# Patient Record
Sex: Male | Born: 1935
Health system: Southern US, Community
[De-identification: ages and names within clinical notes are randomized; demographics above are authoritative.]

## PROBLEM LIST (undated history)

## (undated) DIAGNOSIS — IMO0002 Reserved for concepts with insufficient information to code with codable children: Secondary | ICD-10-CM

## (undated) DIAGNOSIS — M170 Bilateral primary osteoarthritis of knee: Secondary | ICD-10-CM

## (undated) DIAGNOSIS — G20A1 Parkinson's disease without dyskinesia, without mention of fluctuations: Secondary | ICD-10-CM

## (undated) DIAGNOSIS — G20 Parkinson's disease: Secondary | ICD-10-CM

## (undated) DIAGNOSIS — N400 Enlarged prostate without lower urinary tract symptoms: Secondary | ICD-10-CM

## (undated) DIAGNOSIS — A0472 Enterocolitis due to Clostridium difficile, not specified as recurrent: Secondary | ICD-10-CM

## (undated) DIAGNOSIS — N179 Acute kidney failure, unspecified: Secondary | ICD-10-CM

## (undated) DIAGNOSIS — I10 Essential (primary) hypertension: Secondary | ICD-10-CM

## (undated) DIAGNOSIS — N200 Calculus of kidney: Secondary | ICD-10-CM

## (undated) DIAGNOSIS — I714 Abdominal aortic aneurysm, without rupture, unspecified: Secondary | ICD-10-CM

## (undated) DIAGNOSIS — Z9889 Other specified postprocedural states: Secondary | ICD-10-CM

## (undated) DIAGNOSIS — I2699 Other pulmonary embolism without acute cor pulmonale: Secondary | ICD-10-CM

## (undated) DIAGNOSIS — E039 Hypothyroidism, unspecified: Secondary | ICD-10-CM

## (undated) DIAGNOSIS — C069 Malignant neoplasm of mouth, unspecified: Secondary | ICD-10-CM

## (undated) DIAGNOSIS — M779 Enthesopathy, unspecified: Secondary | ICD-10-CM

## (undated) DIAGNOSIS — I82409 Acute embolism and thrombosis of unspecified deep veins of unspecified lower extremity: Secondary | ICD-10-CM

## (undated) HISTORY — PX: APPENDECTOMY: SHX54

## (undated) HISTORY — DX: Acute kidney failure, unspecified: N17.9

## (undated) HISTORY — DX: Enthesopathy, unspecified: M77.9

## (undated) HISTORY — PX: HERNIA REPAIR: SHX51

## (undated) HISTORY — DX: Essential (primary) hypertension: I10

## (undated) HISTORY — DX: Benign prostatic hyperplasia without lower urinary tract symptoms: N40.0

## (undated) HISTORY — DX: Hypothyroidism, unspecified: E03.9

## (undated) HISTORY — PX: CHOLECYSTECTOMY: SHX55

## (undated) HISTORY — DX: Reserved for concepts with insufficient information to code with codable children: IMO0002

## (undated) HISTORY — DX: Other specified postprocedural states: Z98.890

## (undated) HISTORY — DX: Bilateral primary osteoarthritis of knee: M17.0

## (undated) HISTORY — DX: Abdominal aortic aneurysm, without rupture: I71.4

## (undated) HISTORY — PX: TOTAL KNEE ARTHROPLASTY: SHX125

## (undated) HISTORY — DX: Parkinson's disease: G20

## (undated) HISTORY — PX: LITHOTRIPSY: SUR834

## (undated) HISTORY — DX: Abdominal aortic aneurysm, without rupture, unspecified: I71.40

## (undated) HISTORY — DX: Acute embolism and thrombosis of unspecified deep veins of unspecified lower extremity: I82.409

## (undated) HISTORY — DX: Malignant neoplasm of mouth, unspecified: C06.9

## (undated) HISTORY — DX: Calculus of kidney: N20.0

## (undated) HISTORY — DX: Parkinson's disease without dyskinesia, without mention of fluctuations: G20.A1

---

## 1996-02-29 DIAGNOSIS — I82409 Acute embolism and thrombosis of unspecified deep veins of unspecified lower extremity: Secondary | ICD-10-CM

## 1996-02-29 HISTORY — DX: Acute embolism and thrombosis of unspecified deep veins of unspecified lower extremity: I82.409

## 1998-02-28 DIAGNOSIS — Z9889 Other specified postprocedural states: Secondary | ICD-10-CM

## 1998-02-28 HISTORY — DX: Other specified postprocedural states: Z98.890

## 2002-02-28 DIAGNOSIS — C069 Malignant neoplasm of mouth, unspecified: Secondary | ICD-10-CM

## 2002-02-28 HISTORY — PX: OTHER SURGICAL HISTORY: SHX169

## 2002-02-28 HISTORY — DX: Malignant neoplasm of mouth, unspecified: C06.9

## 2005-02-28 DIAGNOSIS — E039 Hypothyroidism, unspecified: Secondary | ICD-10-CM

## 2005-02-28 HISTORY — PX: UMBILICAL HERNIA REPAIR: SHX196

## 2005-02-28 HISTORY — DX: Hypothyroidism, unspecified: E03.9

## 2005-02-28 HISTORY — PX: THYROIDECTOMY: SHX17

## 2008-02-29 DIAGNOSIS — IMO0002 Reserved for concepts with insufficient information to code with codable children: Secondary | ICD-10-CM

## 2008-02-29 HISTORY — PX: TOE AMPUTATION: SHX809

## 2008-02-29 HISTORY — DX: Reserved for concepts with insufficient information to code with codable children: IMO0002

## 2008-02-29 LAB — HM COLONOSCOPY: HM COLON: NORMAL

## 2009-02-28 DIAGNOSIS — N179 Acute kidney failure, unspecified: Secondary | ICD-10-CM

## 2009-02-28 HISTORY — DX: Acute kidney failure, unspecified: N17.9

## 2009-02-28 HISTORY — PX: TRANSURETHRAL RESECTION OF PROSTATE: SHX73

## 2010-02-28 DIAGNOSIS — M779 Enthesopathy, unspecified: Secondary | ICD-10-CM

## 2010-02-28 HISTORY — DX: Enthesopathy, unspecified: M77.9

## 2011-03-01 HISTORY — PX: REPAIR ZENKER'S DIVERTICULA: SUR1212

## 2013-07-31 ENCOUNTER — Ambulatory Visit (INDEPENDENT_AMBULATORY_CARE_PROVIDER_SITE_OTHER): Payer: Commercial Managed Care - HMO | Admitting: Internal Medicine

## 2013-07-31 ENCOUNTER — Encounter: Payer: Self-pay | Admitting: Internal Medicine

## 2013-07-31 VITALS — BP 138/80 | HR 64 | Temp 98.0°F | Ht 70.0 in | Wt 197.0 lb

## 2013-07-31 DIAGNOSIS — M779 Enthesopathy, unspecified: Secondary | ICD-10-CM

## 2013-07-31 DIAGNOSIS — M170 Bilateral primary osteoarthritis of knee: Secondary | ICD-10-CM

## 2013-07-31 DIAGNOSIS — L570 Actinic keratosis: Secondary | ICD-10-CM

## 2013-07-31 DIAGNOSIS — N400 Enlarged prostate without lower urinary tract symptoms: Secondary | ICD-10-CM | POA: Insufficient documentation

## 2013-07-31 DIAGNOSIS — M1710 Unilateral primary osteoarthritis, unspecified knee: Secondary | ICD-10-CM

## 2013-07-31 DIAGNOSIS — Z23 Encounter for immunization: Secondary | ICD-10-CM

## 2013-07-31 DIAGNOSIS — N200 Calculus of kidney: Secondary | ICD-10-CM | POA: Insufficient documentation

## 2013-07-31 DIAGNOSIS — S98139A Complete traumatic amputation of one unspecified lesser toe, initial encounter: Secondary | ICD-10-CM

## 2013-07-31 DIAGNOSIS — IMO0002 Reserved for concepts with insufficient information to code with codable children: Secondary | ICD-10-CM

## 2013-07-31 DIAGNOSIS — S98131A Complete traumatic amputation of one right lesser toe, initial encounter: Secondary | ICD-10-CM | POA: Insufficient documentation

## 2013-07-31 DIAGNOSIS — I10 Essential (primary) hypertension: Secondary | ICD-10-CM

## 2013-07-31 DIAGNOSIS — E039 Hypothyroidism, unspecified: Secondary | ICD-10-CM | POA: Insufficient documentation

## 2013-07-31 HISTORY — DX: Actinic keratosis: L57.0

## 2013-07-31 LAB — CBC WITH DIFFERENTIAL/PLATELET
Basophils Absolute: 0 10*3/uL (ref 0.0–0.1)
Basophils Relative: 0 % (ref 0.0–3.0)
EOS PCT: 1.3 % (ref 0.0–5.0)
Eosinophils Absolute: 0.1 10*3/uL (ref 0.0–0.7)
HCT: 46 % (ref 39.0–52.0)
Hemoglobin: 15.2 g/dL (ref 13.0–17.0)
LYMPHS PCT: 14.7 % (ref 12.0–46.0)
Lymphs Abs: 1.2 10*3/uL (ref 0.7–4.0)
MCHC: 33.1 g/dL (ref 30.0–36.0)
MCV: 90.9 fl (ref 78.0–100.0)
Monocytes Absolute: 0.6 10*3/uL (ref 0.1–1.0)
Monocytes Relative: 7.6 % (ref 3.0–12.0)
NEUTROS ABS: 6.4 10*3/uL (ref 1.4–7.7)
Neutrophils Relative %: 76.4 % (ref 43.0–77.0)
Platelets: 242 10*3/uL (ref 150.0–400.0)
RBC: 5.06 Mil/uL (ref 4.22–5.81)
RDW: 13.8 % (ref 11.5–15.5)
WBC: 8.3 10*3/uL (ref 4.0–10.5)

## 2013-07-31 LAB — COMPREHENSIVE METABOLIC PANEL
ALT: 9 U/L (ref 0–53)
AST: 15 U/L (ref 0–37)
Albumin: 4.2 g/dL (ref 3.5–5.2)
Alkaline Phosphatase: 78 U/L (ref 39–117)
BUN: 17 mg/dL (ref 6–23)
CALCIUM: 9.3 mg/dL (ref 8.4–10.5)
CHLORIDE: 103 meq/L (ref 96–112)
CO2: 30 mEq/L (ref 19–32)
CREATININE: 1.5 mg/dL (ref 0.4–1.5)
GFR: 46.38 mL/min — ABNORMAL LOW (ref >60.00)
Glucose, Bld: 89 mg/dL (ref 70–99)
Potassium: 4.1 mEq/L (ref 3.5–5.1)
Sodium: 140 mEq/L (ref 135–145)
Total Bilirubin: 1.2 mg/dL (ref 0.2–1.2)
Total Protein: 7.1 g/dL (ref 6.0–8.3)

## 2013-07-31 LAB — LIPID PANEL
CHOL/HDL RATIO: 4
Cholesterol: 155 mg/dL (ref 0–200)
HDL: 34.6 mg/dL — ABNORMAL LOW (ref >39.00)
LDL CALC: 96 mg/dL (ref 0–99)
NonHDL: 120.4
Triglycerides: 123 mg/dL (ref 0.0–149.0)
VLDL: 24.6 mg/dL (ref 0.0–40.0)

## 2013-07-31 LAB — TSH: TSH: 0.68 u[IU]/mL (ref 0.35–4.50)

## 2013-07-31 LAB — T4, FREE: Free T4: 1.15 ng/dL (ref 0.60–1.60)

## 2013-07-31 NOTE — Assessment & Plan Note (Signed)
Still with symptoms despite TURP No apparent obstruction

## 2013-07-31 NOTE — Assessment & Plan Note (Signed)
BP Readings from Last 3 Encounters:  07/31/13 138/80   Good control

## 2013-07-31 NOTE — Assessment & Plan Note (Signed)
Due to overlying toe No problems with this

## 2013-07-31 NOTE — Assessment & Plan Note (Signed)
Seems euthyroid ?Will check labs ?

## 2013-07-31 NOTE — Assessment & Plan Note (Signed)
Significant disability  Plans more therapy Handicapped form done

## 2013-07-31 NOTE — Progress Notes (Signed)
Pre visit review using our clinic review tool, if applicable. No additional management support is needed unless otherwise documented below in the visit note. 

## 2013-07-31 NOTE — Addendum Note (Signed)
Addended by: Despina Hidden on: 07/31/2013 01:09 PM   Modules accepted: Orders

## 2013-07-31 NOTE — Progress Notes (Signed)
Subjective:    Patient ID: Walter [name], male    DOB: [birth year], 80 y.o.   MRN: [id number]  HPI Here to establish Recently moved to [facility] from [place]  Has been followed for high blood pressure No problems with medication  Had complicated time with knee surgery DVT once Then prolonged problems after TKR  Had statin induced tendonitis Some trouble with strength Needs therapy Has limited his activities  Hypothyroid since thyroidectomy Had cancer scare but pathology negative Continues to be on med  Arthritis mostly in knees Had left TKR Pain is better No meds now  Multiple kidney stones 2 lithotripsy procedures-- and another one removed cystoscopically No meds for secondary prevention  No current outpatient prescriptions on file prior to visit.   No current facility-administered medications on file prior to visit.    Allergies  Allergen Reactions  . Quinolones     tendonitis    Past Medical History  Diagnosis Date  . Hypertension   . Oral cancer 2004    graft from left thigh  . Osteoarthritis of both knees   . DVT (deep venous thrombosis) 1998    after left knee arthroscopy  . ARF (acute renal failure) 2011    after TKR  . Toe amputation status 2010  . Tendonitis 2012    from quinolone  . BPH (benign prostatic hypertrophy)   . Kidney stones     recurrent  . Hypothyroid 2007    postop from thyroidectomy (cancer scare)    Past Surgical History  Procedure Laterality Date  . Cholecystectomy    . Appendectomy    . Oral cancer removed Right 2004  . Thyroidectomy  2007  . Umbilical hernia repair  2007  . Lithotripsy  2009/2010    then stent and cystoscopic removal 2014  . Total knee arthroplasty Left   . Toe amputation Left 2010    badly overlapping other toe  . Transurethral resection of prostate  2011  . Repair zenker's diverticula  2013    Family History  Problem Relation Age of Onset  . Stroke Father   . Heart disease Neg Hx    . Diabetes Neg Hx     History   Social History  . Marital Status: Married    Spouse Name: N/A    Number of Children: 3  . Years of Education: N/A   Occupational History  . 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 Smoking status: Former Smoker    Types: Cigarettes    Quit date: 04/27/2002  . Smokeless tobacco: Never Used  . Alcohol Use: Yes  . Drug Use: No  . Sexual Activity: Not on file   Other Topics Concern  . Not on file   Social History Narrative   Son works for [employer]   1 daughter in [place], other in [place] living will   Wife, then son, would be health care POA   He isn't sure about DNR   No tube feeds if cognitively unaware   Review of Systems  Constitutional: Negative for fatigue and unexpected weight change.  HENT: Negative for dental problem, hearing loss and tinnitus.   Eyes: Negative for visual disturbance.       No diplopia or unilateral vision loss  Respiratory: Negative for cough, chest tightness and shortness of breath.   Cardiovascular: Negative for chest pain, palpitations and leg swelling.  Gastrointestinal: Negative for nausea, vomiting, abdominal pain, constipation and blood in stool.       No heartburn  Genitourinary: Positive for frequency and difficulty urinating.       Mild slow stream No dribbling usually Ongoing nocturia-- x3 usually  Musculoskeletal: Positive for arthralgias. Negative for back pain and joint swelling.       Mostly just knee problems AM stiffness--tylenol helps  Skin: Negative for rash.       Some dry spots on nose  Allergic/Immunologic: Positive for environmental allergies. Negative for immunocompromised state.       Mild pollen issues-- rare OTC med  Neurological: Positive for weakness. Negative for dizziness, syncope, light-headedness and headaches.  Psychiatric/Behavioral: Negative for sleep disturbance and dysphoric mood. The  patient is not nervous/anxious.        Objective:   Physical Exam  Constitutional: He appears well-developed and well-nourished. No distress.  HENT:  Mouth/Throat: Oropharynx is clear and moist. No oropharyngeal exudate.  Neck: Normal range of motion. Neck supple. No thyromegaly present.  Cardiovascular: Normal rate, regular rhythm, normal heart sounds and intact distal pulses.  Exam reveals no gallop.   No murmur heard. Pulmonary/Chest: Effort normal and breath sounds normal. No respiratory distress. He has no wheezes. He has no rales.  Abdominal: Soft. There is no tenderness.  Musculoskeletal:  1+ non pitting edema  Lymphadenopathy:    He has no cervical adenopathy.  Neurological:  Gait is stiff and slow Mild balance impairment as well  Skin:  2 actinics on nose  Psychiatric: He has a normal mood and affect. His behavior is normal.          Assessment & Plan:

## 2013-07-31 NOTE — Assessment & Plan Note (Signed)
Just uses tylenol

## 2013-07-31 NOTE — Assessment & Plan Note (Signed)
Liquid nitrogen 30 seconds x 2 Discussed home care Will probably establish with derm

## 2013-08-01 ENCOUNTER — Telehealth: Payer: Self-pay | Admitting: Internal Medicine

## 2013-08-01 NOTE — Telephone Encounter (Signed)
Relevant patient education assigned to patient using Emmi. ° °

## 2013-08-06 ENCOUNTER — Encounter: Payer: Self-pay | Admitting: *Deleted

## 2013-09-06 ENCOUNTER — Encounter: Payer: Self-pay | Admitting: Internal Medicine

## 2013-09-06 ENCOUNTER — Ambulatory Visit (INDEPENDENT_AMBULATORY_CARE_PROVIDER_SITE_OTHER): Payer: Commercial Managed Care - HMO | Admitting: Internal Medicine

## 2013-09-06 VITALS — BP 140/70 | HR 68 | Temp 97.7°F | Wt 196.0 lb

## 2013-09-06 DIAGNOSIS — I10 Essential (primary) hypertension: Secondary | ICD-10-CM

## 2013-09-06 NOTE — Progress Notes (Signed)
Pre visit review using our clinic review tool, if applicable. No additional management support is needed unless otherwise documented below in the visit note. 

## 2013-09-06 NOTE — Progress Notes (Signed)
Subjective:    Patient ID: Walter [name], male    DOB: 11-Jun-1931, 80 y.o.   MRN: [id number]  HPI Here due to being denied on annuity due to failing memory screen on phone He doesn't notice any issues He handles all the finances No problems in car--doesn't get lost or have trouble getting around. More cautious  Current Outpatient Prescriptions on File Prior to Visit  Medication Sig Dispense Refill  . acetaminophen (TYLENOL) 500 MG tablet Take 1,000 mg by mouth daily as needed.      Marland Kitchen amLODipine (NORVASC) 5 MG tablet Take 5 mg by mouth daily.      . Cholecalciferol (VITAMIN D) 2000 UNITS CAPS Take by mouth daily.      Marland Kitchen levothyroxine (SYNTHROID, LEVOTHROID) 112 MCG tablet Take 112 mcg by mouth daily before breakfast.       No current facility-administered medications on file prior to visit.    Allergies  Allergen Reactions  . Quinolones     tendonitis    Past Medical History  Diagnosis Date  . Hypertension   . Oral cancer 2004    graft from left thigh  . Osteoarthritis of both knees   . DVT (deep venous thrombosis) 1998    after left knee arthroscopy  . ARF (acute renal failure) 2011    after TKR  . Toe amputation status 2010  . Tendonitis 2012    from quinolone  . BPH (benign prostatic hypertrophy)   . Kidney stones     recurrent  . Hypothyroid 2007    postop from thyroidectomy (cancer scare)    Past Surgical History  Procedure Laterality Date  . Cholecystectomy    . Appendectomy    . Oral cancer removed Right 2004  . Thyroidectomy  2007  . Umbilical hernia repair  2007  . Lithotripsy  2009/2010    then stent and cystoscopic removal 2014  . Total knee arthroplasty Left   . Toe amputation Right 2010    badly overlapping other toe  . Transurethral resection of prostate  2011  . Repair zenker's diverticula  2013    Family History  Problem Relation Age of Onset  . Stroke Father   . Heart disease Neg Hx   . Diabetes Neg Hx     History   Social History    . Marital Status: Married    Spouse Name: N/A    Number of Children: 3  . Years of Education: N/A   Occupational History  . 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 Smoking status: Former Smoker    Types: Cigarettes    Quit date: 04/27/2002  . Smokeless tobacco: Never Used  . Alcohol Use: Yes  . Drug Use: No  . Sexual Activity: Not on file   Other Topics Concern  . Not on file   Social History Narrative   Son works for [employer]   1 daughter in [place], other in [place]      Has living will   Wife, then son, would be health care POA   He isn't sure about DNR   No tube feeds if cognitively unaware   Review of Systems Sleeps fairly well Nocturia x 3 generally--had improved only briefly after TURP Appetite is fine Weight is stable    Objective:   Physical Exam  Constitutional: He is oriented to person, place, and time. He appears well-developed and well-nourished. No distress.  Neurological: He is alert and oriented to person, place, and time.  President-- "Obama, Tawni Pummel, Clinton" [phone] [id number] Recall 3/3          Assessment & Plan:

## 2013-09-06 NOTE — Assessment & Plan Note (Signed)
BP Readings from Last 3 Encounters:  09/06/13 140/70  07/31/13 138/80   Still good control No concern about cognitive issues---letter written

## 2013-12-04 ENCOUNTER — Ambulatory Visit (INDEPENDENT_AMBULATORY_CARE_PROVIDER_SITE_OTHER): Payer: Commercial Managed Care - HMO

## 2013-12-04 DIAGNOSIS — Z23 Encounter for immunization: Secondary | ICD-10-CM

## 2013-12-26 ENCOUNTER — Other Ambulatory Visit: Payer: Self-pay

## 2013-12-26 MED ORDER — LEVOTHYROXINE SODIUM 112 MCG PO TABS: 112.0000 ug | ORAL_TABLET | Freq: Every day | ORAL | Status: DC

## 2013-12-26 MED ORDER — AMLODIPINE BESYLATE 5 MG PO TABS: 5.0000 mg | ORAL_TABLET | Freq: Every day | ORAL | Status: DC

## 2013-12-26 NOTE — Telephone Encounter (Signed)
Note left requesting refill amlodipine and levothyroxine to [facility]; advised pt done.

## 2014-01-01 ENCOUNTER — Encounter: Payer: Self-pay | Admitting: Internal Medicine

## 2014-01-01 ENCOUNTER — Ambulatory Visit (INDEPENDENT_AMBULATORY_CARE_PROVIDER_SITE_OTHER): Payer: Commercial Managed Care - HMO | Admitting: Internal Medicine

## 2014-01-01 VITALS — BP 138/84 | HR 70 | Temp 97.8°F | Resp 14 | Wt 191.2 lb

## 2014-01-01 DIAGNOSIS — Q396 Congenital diverticulum of esophagus: Secondary | ICD-10-CM | POA: Insufficient documentation

## 2014-01-01 NOTE — Assessment & Plan Note (Signed)
Reviewed his diet Likes oatmeal for breakfast Trouble at lunch--- discussed tuna and chicken salad(chopped) Troubles with fruits and vegetables---especially with rind No GERD so will avoid acid reducing therapy  Will refer for medical nutrition therapy to be sure he is getting a proper balanced diet

## 2014-01-01 NOTE — Progress Notes (Signed)
Pre visit review using our clinic review tool, if applicable. No additional management support is needed unless otherwise documented below in the visit note. 

## 2014-01-01 NOTE — Progress Notes (Signed)
Subjective:    Patient ID: Walter [name], male    DOB: [birth year], 80 y.o.   MRN: [id number]  HPI Here with wife  He has been having teeth removed--- 6 pulled on top and now has partial Trouble chewing with bad feet Has Zenker's diverticulum operated on in esophagus--- incomplete removal (or closing off-- it was "stapled") Has had intermittent choking issues in past if not careful with eating  Now especially having trouble with meat with new partial denture Wife trying to cook soft food for him Saw another specialist in [place]--didn't recommend resection of the diverticulum  Current Outpatient Prescriptions on File Prior to Visit  Medication Sig Dispense Refill  . acetaminophen (TYLENOL) 500 MG tablet Take 1,000 mg by mouth daily as needed.    Marland Kitchen amLODipine (NORVASC) 5 MG tablet Take 1 tablet (5 mg total) by mouth daily. 90 tablet 1  . Cholecalciferol (VITAMIN D) 2000 UNITS CAPS Take by mouth daily.    Marland Kitchen levothyroxine (SYNTHROID, LEVOTHROID) 112 MCG tablet Take 1 tablet (112 mcg total) by mouth daily before breakfast. 90 tablet 2   No current facility-administered medications on file prior to visit.    Allergies  Allergen Reactions  . Quinolones     tendonitis    Past Medical History  Diagnosis Date  . Hypertension   . Oral cancer 2004    graft from left thigh  . Osteoarthritis of both knees   . DVT (deep venous thrombosis) 1998    after left knee arthroscopy  . ARF (acute renal failure) 2011    after TKR  . Toe amputation status 2010  . Tendonitis 2012    from quinolone  . BPH (benign prostatic hypertrophy)   . Kidney stones     recurrent  . Hypothyroid 2007    postop from thyroidectomy (cancer scare)    Past Surgical History  Procedure Laterality Date  . Cholecystectomy    . Appendectomy    . Oral cancer removed Right 2004  . Thyroidectomy  2007  . Umbilical hernia repair  2007  . Lithotripsy  2009/2010    then stent and cystoscopic removal 2014    . Total knee arthroplasty Left   . Toe amputation Right 2010    badly overlapping other toe  . Transurethral resection of prostate  2011  . Repair zenker's diverticula  2013    Family History  Problem Relation Age of Onset  . Stroke Father   . Heart disease Neg Hx   . Diabetes Neg Hx     History   Social History  . Marital Status: Married    Spouse Name: N/A    Number of Children: 3  . Years of Education: N/A   Occupational History  . 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 Smoking status: Former Smoker    Types: Cigarettes    Quit date: 04/27/2002  . Smokeless tobacco: Never Used  . Alcohol Use: Yes  . Drug Use: No  . Sexual Activity: Not on file   Other Topics Concern  . Not on file   Social History Narrative   Son works for [employer]   1 daughter in [place], other in [place] living will   Wife, then son, would be health care POA   He isn't sure about DNR   No tube feeds if cognitively unaware   Review of Systems Has lost  5# Especially has trouble with bread    Objective:   Physical Exam  Psychiatric: He has a normal mood and affect. His behavior is normal.          Assessment & Plan:

## 2014-02-17 ENCOUNTER — Ambulatory Visit (INDEPENDENT_AMBULATORY_CARE_PROVIDER_SITE_OTHER): Payer: Commercial Managed Care - HMO | Admitting: Internal Medicine

## 2014-02-17 ENCOUNTER — Encounter: Payer: Self-pay | Admitting: Internal Medicine

## 2014-02-17 VITALS — BP 130/70 | HR 66 | Temp 97.5°F | Wt 190.0 lb

## 2014-02-17 DIAGNOSIS — S335XXA Sprain of ligaments of lumbar spine, initial encounter: Secondary | ICD-10-CM

## 2014-02-17 DIAGNOSIS — R300 Dysuria: Secondary | ICD-10-CM

## 2014-02-17 DIAGNOSIS — N200 Calculus of kidney: Secondary | ICD-10-CM

## 2014-02-17 LAB — POCT URINALYSIS DIPSTICK
Bilirubin, UA: NEGATIVE
Blood, UA: NEGATIVE
Glucose, UA: NEGATIVE
KETONES UA: NEGATIVE
LEUKOCYTES UA: NEGATIVE
NITRITE UA: NEGATIVE
PH UA: 6.5
Protein, UA: NEGATIVE
Spec Grav, UA: 1.02
Urobilinogen, UA: NEGATIVE

## 2014-02-17 NOTE — Progress Notes (Signed)
Pre visit review using our clinic review tool, if applicable. No additional management support is needed unless otherwise documented below in the visit note. 

## 2014-02-17 NOTE — Progress Notes (Signed)
Subjective:    Patient ID: Walter [name], male    DOB: 08/22/1931, 80 y.o.   MRN: [id number]  HPI Here for possible kidney stones Long history of this  Had procedure with stent about a year ago--before moving here Stone was removed through stent then  Lithotripsy x 2 in past  ~1 week ago---bent over Suddenly had "crack in back"  Now having pain in back and across his abdomen--points to bilateral lumbar paraspinals Hurts getting in and out of car Has to move his trunk slowly Wonders if stones moved Seems better now over the past few days  No dysuria No sig urgency No hematuria Nocturia x 3-4 is stable  No fever Attack now is not like his bad kidney stone attacks  Current Outpatient Prescriptions on File Prior to Visit  Medication Sig Dispense Refill  . acetaminophen (TYLENOL) 500 MG tablet Take 1,000 mg by mouth daily as needed.    Marland Kitchen amLODipine (NORVASC) 5 MG tablet Take 1 tablet (5 mg total) by mouth daily. 90 tablet 1  . Cholecalciferol (VITAMIN D) 2000 UNITS CAPS Take by mouth daily.    Marland Kitchen levothyroxine (SYNTHROID, LEVOTHROID) 112 MCG tablet Take 1 tablet (112 mcg total) by mouth daily before breakfast. 90 tablet 2   No current facility-administered medications on file prior to visit.    Allergies  Allergen Reactions  . Quinolones     tendonitis    Past Medical History  Diagnosis Date  . Hypertension   . Oral cancer 2004    graft from left thigh  . Osteoarthritis of both knees   . DVT (deep venous thrombosis) 1998    after left knee arthroscopy  . ARF (acute renal failure) 2011    after TKR  . Toe amputation status 2010  . Tendonitis 2012    from quinolone  . BPH (benign prostatic hypertrophy)   . Kidney stones     recurrent  . Hypothyroid 2007    postop from thyroidectomy (cancer scare)    Past Surgical History  Procedure Laterality Date  . Cholecystectomy    . Appendectomy    . Oral cancer removed Right 2004  . Thyroidectomy  2007  . Umbilical  hernia repair  2007  . Lithotripsy  2009/2010    then stent and cystoscopic removal 2014  . Total knee arthroplasty Left   . Toe amputation Right 2010    badly overlapping other toe  . Transurethral resection of prostate  2011  . Repair zenker's diverticula  2013    Family History  Problem Relation Age of Onset  . Stroke Father   . Heart disease Neg Hx   . Diabetes Neg Hx     History   Social History  . Marital Status: Married    Spouse Name: N/A    Number of Children: 3  . Years of Education: N/A   Occupational History  . 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 Smoking status: Former Smoker    Types: Cigarettes    Quit date: 04/27/2002  . Smokeless tobacco: Never Used  . Alcohol Use: Yes  . Drug Use: No  . Sexual Activity: Not on file   Other Topics Concern  . Not on file   Social History Narrative   Son works for [employer]   1 daughter in [place], other in [place] living will   Wife, then son,  would be health care POA   He isn't sure about DNR   No tube feeds if cognitively unaware   Review of Systems  No nausea or vomiting Appetite is fair     Objective:   Physical Exam  Constitutional: He appears well-developed and well-nourished. No distress.  Musculoskeletal:  Walking stiffly  Fair flexion (about 80 degrees) SLR negative No spine tenderness Sensitive in bilateral lumbar paraspinals No CVA tenderness  Neurological:  No focal weakness          Assessment & Plan:

## 2014-02-17 NOTE — Assessment & Plan Note (Signed)
Wants to establish with local urologist

## 2014-02-17 NOTE — Assessment & Plan Note (Signed)
Reassured him that this doesn't seem to be a kidney stone Just back sprain Discussed continuing heat and tylenol This gets better on its own

## 2014-04-03 DIAGNOSIS — Z87442 Personal history of urinary calculi: Secondary | ICD-10-CM | POA: Diagnosis not present

## 2014-04-03 DIAGNOSIS — R351 Nocturia: Secondary | ICD-10-CM | POA: Diagnosis not present

## 2014-04-03 DIAGNOSIS — N401 Enlarged prostate with lower urinary tract symptoms: Secondary | ICD-10-CM | POA: Diagnosis not present

## 2014-04-07 ENCOUNTER — Ambulatory Visit: Payer: Self-pay | Admitting: Urology

## 2014-04-07 DIAGNOSIS — N200 Calculus of kidney: Secondary | ICD-10-CM | POA: Diagnosis not present

## 2014-04-15 ENCOUNTER — Ambulatory Visit (INDEPENDENT_AMBULATORY_CARE_PROVIDER_SITE_OTHER): Payer: Commercial Managed Care - HMO | Admitting: Internal Medicine

## 2014-04-15 ENCOUNTER — Encounter: Payer: Self-pay | Admitting: Internal Medicine

## 2014-04-15 VITALS — BP 128/84 | HR 67 | Temp 98.0°F | Wt 187.0 lb

## 2014-04-15 DIAGNOSIS — H6123 Impacted cerumen, bilateral: Secondary | ICD-10-CM | POA: Diagnosis not present

## 2014-04-15 NOTE — Patient Instructions (Signed)
Otalgia  The most common reason for this in children is an infection of the middle ear. Pain from the middle ear is usually caused by a build-up of fluid and pressure behind the eardrum. Pain from an earache can be sharp, dull, or burning. The pain may be temporary or constant. The middle ear is connected to the nasal passages by a short narrow tube called the Eustachian tube. The Eustachian tube allows fluid to drain out of the middle ear, and helps keep the pressure in your ear equalized.  CAUSES   A cold or allergy can block the Eustachian tube with inflammation and the build-up of secretions. This is especially likely in small children, because their Eustachian tube is shorter and more horizontal. When the Eustachian tube closes, the normal flow of fluid from the middle ear is stopped. Fluid can accumulate and cause stuffiness, pain, hearing loss, and an ear infection if germs start growing in this area.  SYMPTOMS   The symptoms of an ear infection may include fever, ear pain, fussiness, increased crying, and irritability. Many children will have temporary and minor hearing loss during and right after an ear infection. Permanent hearing loss is rare, but the risk increases the more infections a child has. Other causes of ear pain include retained water in the outer ear canal from swimming and bathing.  Ear pain in adults is less likely to be from an ear infection. Ear pain may be referred from other locations. Referred pain may be from the joint between your jaw and the skull. It may also come from a tooth problem or problems in the neck. Other causes of ear pain include:   A foreign body in the ear.   Outer ear infection.   Sinus infections.   Impacted ear wax.   Ear injury.   Arthritis of the jaw or TMJ problems.   Middle ear infection.   Tooth infections.   Sore throat with pain to the ears.  DIAGNOSIS   Your caregiver can usually make the diagnosis by examining you. Sometimes other special studies,  including x-rays and lab work may be necessary.  TREATMENT    If antibiotics were prescribed, use them as directed and finish them even if you or your child's symptoms seem to be improved.   Sometimes PE tubes are needed in children. These are little plastic tubes which are put into the eardrum during a simple surgical procedure. They allow fluid to drain easier and allow the pressure in the middle ear to equalize. This helps relieve the ear pain caused by pressure changes.  HOME CARE INSTRUCTIONS    Only take over-the-counter or prescription medicines for pain, discomfort, or fever as directed by your caregiver. DO NOT GIVE CHILDREN ASPIRIN because of the association of Reye's Syndrome in children taking aspirin.   Use a cold pack applied to the outer ear for 15-20 minutes, 03-04 times per day or as needed may reduce pain. Do not apply ice directly to the skin. You may cause frost bite.   Over-the-counter ear drops used as directed may be effective. Your caregiver may sometimes prescribe ear drops.   Resting in an upright position may help reduce pressure in the middle ear and relieve pain.   Ear pain caused by rapidly descending from high altitudes can be relieved by swallowing or chewing gum. Allowing infants to suck on a bottle during airplane travel can help.   Do not smoke in the house or near children. If you are   unable to quit smoking, smoke outside.   Control allergies.  SEEK IMMEDIATE MEDICAL CARE IF:    You or your child are becoming sicker.   Pain or fever relief is not obtained with medicine.   You or your child's symptoms (pain, fever, or irritability) do not improve within 24 to 48 hours or as instructed.   Severe pain suddenly stops hurting. This may indicate a ruptured eardrum.   You or your children develop new problems such as severe headaches, stiff neck, difficulty swallowing, or swelling of the face or around the ear.  Document Released: 10/02/2003 Document Revised: 05/09/2011  Document Reviewed: 02/06/2008  [employer] Patient Information 2015 [employer]. This information is not intended to replace advice given to you by your health care provider. Make sure you discuss any questions you have with your health care provider.

## 2014-04-15 NOTE — Progress Notes (Signed)
Pre visit review using our clinic review tool, if applicable. No additional management support is needed unless otherwise documented below in the visit note. 

## 2014-04-15 NOTE — Progress Notes (Signed)
Subjective:    Patient ID: Walter [name], male    DOB: 01-Jun-1931, 81 y.o.   MRN: [id number]  HPI  Pt presents to the clinic today with c/o bilateral ear pain. He reports this started yesterday. He denies upper respiratory infection, fever, more hearing loss than normal or dizziness. He reports that he had the nurse look at it at [facility] and she advised him to come have it evaluated, thought he may have had pus in his ear.  Review of Systems      Past Medical History  Diagnosis Date  . Hypertension   . Oral cancer 2004    graft from left thigh  . Osteoarthritis of both knees   . DVT (deep venous thrombosis) 1998    after left knee arthroscopy  . ARF (acute renal failure) 2011    after TKR  . Toe amputation status 2010  . Tendonitis 2012    from quinolone  . BPH (benign prostatic hypertrophy)   . Kidney stones     recurrent  . Hypothyroid 2007    postop from thyroidectomy (cancer scare)    Current Outpatient Prescriptions  Medication Sig Dispense Refill  . acetaminophen (TYLENOL) 500 MG tablet Take 1,000 mg by mouth daily as needed.    Marland Kitchen amLODipine (NORVASC) 5 MG tablet Take 1 tablet (5 mg total) by mouth daily. 90 tablet 1  . Cholecalciferol (VITAMIN D) 2000 UNITS CAPS Take by mouth daily.    Marland Kitchen levothyroxine (SYNTHROID, LEVOTHROID) 112 MCG tablet Take 1 tablet (112 mcg total) by mouth daily before breakfast. 90 tablet 2   No current facility-administered medications for this visit.    Allergies  Allergen Reactions  . Quinolones     tendonitis    Family History  Problem Relation Age of Onset  . Stroke Father   . Heart disease Neg Hx   . Diabetes Neg Hx     History   Social History  . Marital Status: Married    Spouse Name: N/A  . Number of Children: 3  . Years of Education: N/A   Occupational History  . 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 Smoking status: Former Smoker   Types: Cigarettes    Quit date: 04/27/2002  . Smokeless tobacco: Never Used  . Alcohol Use: Yes  . Drug Use: No  . Sexual Activity: Not on file   Other Topics Concern  . Not on file   Social History Narrative   Son works for [employer]   1 daughter in [place], other in [place] living will   Wife, then son, would be health care POA   He isn't sure about DNR   No tube feeds if cognitively unaware     Constitutional: Denies fever, malaise, fatigue, headache or abrupt weight changes.  HEENT: Pt reports ear pain. Denies eye pain, eye redness, ringing in the ears, wax buildup, runny nose, nasal congestion, bloody nose, or sore throat. Respiratory: Denies difficulty breathing, shortness of breath, cough or sputum production.   Cardiovascular: Denies chest pain, chest tightness, palpitations or swelling in the hands or feet.  Neurological: Denies dizziness.   No other specific complaints in a complete review of systems (except as listed in HPI above).  Objective:   Physical Exam BP 128/84 mmHg  Pulse 67  Temp(Src) 98 F (36.7 C) (Oral)  Wt 187 lb (84.823 kg)  SpO2  97% Wt Readings from Last 3 Encounters:  04/15/14 187 lb (84.823 kg)  02/17/14 190 lb (86.183 kg)  01/01/14 191 lb 4 oz (86.75 kg)    General: Appears his stated age, well developed, well nourished in NAD. HEENT: Head: normal shape and size;  Ears: Tm's gray and intact, normal light reflex;, slight cerumen buildup. Cardiovascular: Normal rate and rhythm. S1,S2 noted.  No murmur, rubs or gallops noted.  Pulmonary/Chest: Normal effort and positive vesicular breath sounds. No respiratory distress. No wheezes, rales or ronchi noted.  Neurological: Alert and oriented.   BMET    Component Value Date/Time   NA 140 07/31/2013 1320   K 4.1 07/31/2013 1320   CL 103 07/31/2013 1320   CO2 30 07/31/2013 1320   GLUCOSE 89 07/31/2013 1320   BUN 17 07/31/2013 1320   CREATININE 1.5 07/31/2013 1320   CALCIUM 9.3  07/31/2013 1320    Lipid Panel     Component Value Date/Time   CHOL 155 07/31/2013 1320   TRIG 123.0 07/31/2013 1320   HDL 34.60* 07/31/2013 1320   CHOLHDL 4 07/31/2013 1320   VLDL 24.6 07/31/2013 1320   LDLCALC 96 07/31/2013 1320    CBC    Component Value Date/Time   WBC 8.3 07/31/2013 1320   RBC 5.06 07/31/2013 1320   HGB 15.2 07/31/2013 1320   HCT 46.0 07/31/2013 1320   PLT 242.0 07/31/2013 1320   MCV 90.9 07/31/2013 1320   MCHC 33.1 07/31/2013 1320   RDW 13.8 07/31/2013 1320   LYMPHSABS 1.2 07/31/2013 1320   MONOABS 0.6 07/31/2013 1320   EOSABS 0.1 07/31/2013 1320   BASOSABS 0.0 07/31/2013 1320    Hgb A1C No results found for: HGBA1C        Assessment & Plan:   Cerumen, bilateral ears:  Wax removed by provider using cerumen spoon Ok to try Debrox solution OTC  RTC as needed or if symptoms persist or worsen

## 2014-05-14 ENCOUNTER — Other Ambulatory Visit: Payer: Self-pay | Admitting: Internal Medicine

## 2014-05-29 ENCOUNTER — Encounter: Payer: Self-pay | Admitting: Family Medicine

## 2014-05-29 ENCOUNTER — Ambulatory Visit (INDEPENDENT_AMBULATORY_CARE_PROVIDER_SITE_OTHER): Payer: Commercial Managed Care - HMO | Admitting: Family Medicine

## 2014-05-29 VITALS — BP 136/80 | HR 65 | Temp 98.6°F | Ht 70.0 in | Wt 185.8 lb

## 2014-05-29 DIAGNOSIS — Z89421 Acquired absence of other right toe(s): Secondary | ICD-10-CM

## 2014-05-29 DIAGNOSIS — M545 Low back pain, unspecified: Secondary | ICD-10-CM

## 2014-05-29 DIAGNOSIS — R269 Unspecified abnormalities of gait and mobility: Secondary | ICD-10-CM

## 2014-05-29 DIAGNOSIS — IMO0002 Reserved for concepts with insufficient information to code with codable children: Secondary | ICD-10-CM

## 2014-05-29 DIAGNOSIS — M79671 Pain in right foot: Secondary | ICD-10-CM | POA: Diagnosis not present

## 2014-05-29 NOTE — Patient Instructions (Signed)
F/u with me at 8 AM or 2 PM for orthotics appointment   REFERRALS TO SPECIALISTS, SPECIAL TESTS (MRI, CT, ULTRASOUNDS)  MARION or LINDA will help you. ASK CHECK-IN FOR HELP.  Imaging / Special Testing referrals sometimes can be done same day if EMERGENCY, but others can take 2 or 3 days to get an appointment. Starting in 2015, many of the new Medicare plans and Obamacare plans take much longer.   Specialist appointment times vary a great deal, based on their schedule / openings. -- Some specialists have very long wait times. (Example. Dermatology. Multiple months  for non-cancer)

## 2014-05-29 NOTE — Progress Notes (Signed)
Pre visit review using our clinic review tool, if applicable. No additional management support is needed unless otherwise documented below in the visit note. 

## 2014-05-29 NOTE — Progress Notes (Signed)
Dr. Frederico Hamman T. Everli Rother, MD, [facility] [place], [address] Phone: [phone] Fax: [phone]  05/29/2014  Patient: Walter [name], MRN: [id number], DOB: 27-May-1931, 81 y.o.  Primary Physician:  Viviana Simpler, MD  Chief Complaint: Trouble Walking and Back Pain  Subjective:   Walter [name] is a 81 y.o. very pleasant male patient who presents with the following:  Very pleasant elderly gentleman who is 81 years old and comes in with numerous muscular skeletal complaints.  Primarily, he relates a story where he had a total knee replacement in 2010, and he subsequently developed renal failure, and he ultimately developed an infection and was given a fluoroquinolone.  He reports to me that he had tendinitis or tendinopathy, but he cannot really specifically point to 1 tendon, and he states that he had a total lack of strength and use of his musculature throughout most of his lower extremity.  Since then he has never really fully recovered and he has some loss of motion in the affected knee.  He tries to do some working out, tries to maintain his strength, but he also has had his second toe on the right amputated and now has some significant forefoot changes and is having difficulty walking and maintaining his balance.  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  Knee never recovered - 2010.  Right foot bad, had his right toe removed.  Then got a quinolone, and had tendonitis.   Never has recovered.  Minimal strength from the knee on down.  Now living at [facility].  Has been doing some exercise. Not doing well.   Toes are moving in the forefoot. Hard getting in and out of bed. Hurts to walk. Has not played   Past Medical History, Surgical History, Social History, Family History, Problem List, Medications, and Allergies have been reviewed and updated if relevant.  GEN: No fevers, chills. Nontoxic.  Primarily MSK c/o today. MSK: Detailed in the HPI GI: tolerating PO intake without difficulty Neuro: No numbness, parasthesias, or tingling associated. Otherwise the pertinent positives of the ROS are noted above.   Objective:   BP 136/80 mmHg  Pulse 65  Temp(Src) 98.6 F (37 C) (Oral)  Ht 5\' 10"  (1.778 m)  Wt 185 lb 12 oz (84.256 kg)  BMI 26.65 kg/m2   GEN: WDWN, NAD, Non-toxic, Alert & Oriented x 3 HEENT: Atraumatic, Normocephalic.  Ears and Nose: No external deformity. EXTR: No clubbing/cyanosis/edema PSYCH: Normally interactive. Conversant. Not depressed or anxious appearing.  Calm demeanor.   FEET: B Echymosis: no Edema: no ROM: mild restriction and great toes Gait: heel toe, antalgic, supinates on the R MT pain: no Lateral Mall: NT Medial Mall: NT Talus: NT Navicular: NT Cuboid: NT Calcaneous: NT Achilles: NT Plantar Fascia: NT Fat Pad: NT Peroneals: NT Post Tib: NT CFL: NT Deltoid: NT Sensation: intact   Tremendous amount of foot breakdown right greater than left.  This is particularly evident in the forefoot, there is no transverse arch that is present.  Second toe is been removed on the right, and there the remaining lateral digits have now moved over the great toe.  This is approximately 2 and 3, but all of them seem to be hammered and elevated.  Mild longitudinal arch collapse bilaterally.  Additionally there is some significant forefoot collapse on the left, but just compared to the right it is not quite as significant.  Radiology: No results found.  Assessment and Plan:   Foot pain, right - Plan: Ambulatory referral to Physical Therapy  Toe amputation status, right - Plan: Ambulatory referral to Physical Therapy  Gait disturbance  Bilateral low back pain without sciatica  Extremely complicated foot case.  I think that his back pain is all coming secondary to gait abnormalities.  I'm going to have him go to formal physical therapy for gait  training, stability, and strengthening.  We are also going to make him some custom orthotics which hopefully will help him get some stability while walking.   New Prescriptions   No medications on file   Orders Placed This Encounter  Procedures  . Ambulatory referral to Physical Therapy    Signed,  Frederico Hamman T. Salvador Coupe, MD   Patient's Medications  New Prescriptions   No medications on file  Previous Medications   ACETAMINOPHEN (TYLENOL) 500 MG TABLET    Take 1,000 mg by mouth daily as needed.   AMLODIPINE (NORVASC) 5 MG TABLET    TAKE 1 TABLET EVERY DAY   CHOLECALCIFEROL (VITAMIN D) 2000 UNITS CAPS    Take by mouth daily.   LEVOTHYROXINE (SYNTHROID, LEVOTHROID) 112 MCG TABLET    Take 1 tablet (112 mcg total) by mouth daily before breakfast.  Modified Medications   No medications on file  Discontinued Medications   No medications on file

## 2014-06-09 ENCOUNTER — Encounter: Payer: Self-pay | Admitting: Family Medicine

## 2014-06-09 ENCOUNTER — Telehealth: Payer: Self-pay | Admitting: Internal Medicine

## 2014-06-09 ENCOUNTER — Ambulatory Visit (INDEPENDENT_AMBULATORY_CARE_PROVIDER_SITE_OTHER): Payer: Commercial Managed Care - HMO | Admitting: Family Medicine

## 2014-06-09 VITALS — BP 140/80 | HR 74 | Temp 98.5°F | Ht 70.0 in | Wt 187.5 lb

## 2014-06-09 DIAGNOSIS — M79671 Pain in right foot: Secondary | ICD-10-CM | POA: Diagnosis not present

## 2014-06-09 DIAGNOSIS — M545 Low back pain, unspecified: Secondary | ICD-10-CM

## 2014-06-09 DIAGNOSIS — R269 Unspecified abnormalities of gait and mobility: Secondary | ICD-10-CM

## 2014-06-09 DIAGNOSIS — Z89421 Acquired absence of other right toe(s): Secondary | ICD-10-CM | POA: Diagnosis not present

## 2014-06-09 DIAGNOSIS — IMO0002 Reserved for concepts with insufficient information to code with codable children: Secondary | ICD-10-CM

## 2014-06-09 NOTE — Progress Notes (Signed)
Pre visit review using our clinic review tool, if applicable. No additional management support is needed unless otherwise documented below in the visit note. 

## 2014-06-09 NOTE — Telephone Encounter (Signed)
Recorded.

## 2014-06-09 NOTE — Telephone Encounter (Signed)
Pt called to let you know Name of shoe store is [employer]   In [place]  On [address]

## 2014-06-10 NOTE — Progress Notes (Signed)
Dr. Frederico Hamman T. Tessi Eustache, MD, [facility] [place], [address] Phone: [phone] Fax: [phone]  06/09/2014  Patient: Walter [name], MRN: [id number], DOB: 1931-06-18, 81 y.o.  Primary Physician:  Viviana Simpler, MD  Chief Complaint: Foot Orthotics  Subjective:   Walter [name] is a 81 y.o. very pleasant male patient who presents with the following:  Elderly gentleman who I remember well.  Multiple musculoskeletal complaints and traumatic forefoot changes causing some instability with walking and basic gait.  This is detailed below.  He has had some difficulties over the last 4 or 5 years.  He is status post second toe removal on the right foot and he has had dramatic forefoot changes particularly on the right, and also some on the left.  His ambulation is fairly limited at this point.  05/29/2014 Last OV with Owens Loffler, MD  Very pleasant elderly gentleman who is 82 years old and comes in with numerous muscular skeletal complaints.  Primarily, he relates a story where he had a total knee replacement in 2010, and he subsequently developed renal failure, and he ultimately developed an infection and was given a fluoroquinolone.  He reports to me that he had tendinitis or tendinopathy, but he cannot really specifically point to 1 tendon, and he states that he had a total lack of strength and use of his musculature throughout most of his lower extremity.  Since then he has never really fully recovered and he has some loss of motion in the affected knee.  He tries to do some working out, tries to maintain his strength, but he also has had his second toe on the right amputated and now has some significant forefoot changes and is having difficulty walking and maintaining his balance.  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  Knee never recovered - 2010.  Right foot bad, had his right toe removed.  Then  got a quinolone, and had tendonitis.   Never has recovered.  Minimal strength from the knee on down.  Now living at [facility].  Has been doing some exercise. Not doing well.   Toes are moving in the forefoot. Hard getting in and out of bed. Hurts to walk. Has not played   Past Medical History, Surgical History, Social History, Family History, Problem List, Medications, and Allergies have been reviewed and updated if relevant.  GEN: No fevers, chills. Nontoxic. Primarily MSK c/o today. MSK: Detailed in the HPI GI: tolerating PO intake without difficulty Neuro: No numbness, parasthesias, or tingling associated. Otherwise the pertinent positives of the ROS are noted above.   Objective:   BP 140/80 mmHg  Pulse 74  Temp(Src) 98.5 F (36.9 C) (Oral)  Ht 5\' 10"  (1.778 m)  Wt 187 lb 8 oz (85.049 kg)  BMI 26.90 kg/m2   GEN: WDWN, NAD, Non-toxic, Alert & Oriented x 3 HEENT: Atraumatic, Normocephalic.  Ears and Nose: No external deformity. EXTR: No clubbing/cyanosis/edema PSYCH: Normally interactive. Conversant. Not depressed or anxious appearing.  Calm demeanor.   FEET: B Echymosis: no Edema: no ROM: mild restriction and great toes Gait: heel toe, antalgic, supinates on the R MT pain: no Lateral Mall: NT Medial Mall: NT Talus: NT Navicular: NT Cuboid: NT Calcaneous: NT Achilles: NT Plantar Fascia: NT Fat Pad: NT Peroneals: NT Post Tib: NT CFL: NT Deltoid: NT Sensation: intact   Tremendous amount of foot breakdown right greater than left.  This is particularly evident in the forefoot, there is no transverse arch that is present.  Second toe is been removed on the right, and there the remaining lateral digits have now moved over the great toe.  This is approximately 2 and 3, but all of them seem to be hammered and elevated.  Mild longitudinal arch collapse bilaterally.  Additionally there is some significant forefoot collapse on the left, but just compared to the right  it is not quite as significant.  Radiology: No results found.  Assessment and Plan:   Foot pain, right  Toe amputation status, right  Gait disturbance  Bilateral low back pain without sciatica  >40 minutes spent in face to face time with patient, >50% spent in counselling or coordination of care: additional time spent in gait analysis, anatomy, shoes, balance.  Patient was fitted for a standard, cushioned, semi-rigid orthotic.  The orthotic was heated and the patient stood on the orthotic blank positioned on the orthotic stand. The patient was positioned in subtalar neutral position and 10 degrees of ankle dorsiflexion in a weight bearing stance. After molding, I elected to not place a base.  Size: 12 base: none posting: Crafted a partial MT bar B, excluding the great toe. Goal to increase purchase from the 2-5 toes bilaterally. Patient appeared to have greater purchase and walk straighter after we did this.  additional orthotic padding: none   Gave him some additional poron.  Signed,  Maud Deed. Gerrad Welker, MD   Patient's Medications  New Prescriptions   No medications on file  Previous Medications   ACETAMINOPHEN (TYLENOL) 500 MG TABLET    Take 1,000 mg by mouth daily as needed.   AMLODIPINE (NORVASC) 5 MG TABLET    TAKE 1 TABLET EVERY DAY   CHOLECALCIFEROL (VITAMIN D) 2000 UNITS CAPS    Take by mouth daily.   LEVOTHYROXINE (SYNTHROID, LEVOTHROID) 112 MCG TABLET    Take 1 tablet (112 mcg total) by mouth daily before breakfast.  Modified Medications   No medications on file  Discontinued Medications   No medications on file

## 2014-07-01 DIAGNOSIS — M9903 Segmental and somatic dysfunction of lumbar region: Secondary | ICD-10-CM | POA: Diagnosis not present

## 2014-07-01 DIAGNOSIS — M5416 Radiculopathy, lumbar region: Secondary | ICD-10-CM | POA: Diagnosis not present

## 2014-07-01 DIAGNOSIS — M955 Acquired deformity of pelvis: Secondary | ICD-10-CM | POA: Diagnosis not present

## 2014-07-01 DIAGNOSIS — M9905 Segmental and somatic dysfunction of pelvic region: Secondary | ICD-10-CM | POA: Diagnosis not present

## 2014-07-02 DIAGNOSIS — M9903 Segmental and somatic dysfunction of lumbar region: Secondary | ICD-10-CM | POA: Diagnosis not present

## 2014-07-02 DIAGNOSIS — M9905 Segmental and somatic dysfunction of pelvic region: Secondary | ICD-10-CM | POA: Diagnosis not present

## 2014-07-02 DIAGNOSIS — M955 Acquired deformity of pelvis: Secondary | ICD-10-CM | POA: Diagnosis not present

## 2014-07-02 DIAGNOSIS — M5416 Radiculopathy, lumbar region: Secondary | ICD-10-CM | POA: Diagnosis not present

## 2014-07-03 DIAGNOSIS — M9903 Segmental and somatic dysfunction of lumbar region: Secondary | ICD-10-CM | POA: Diagnosis not present

## 2014-07-03 DIAGNOSIS — M5416 Radiculopathy, lumbar region: Secondary | ICD-10-CM | POA: Diagnosis not present

## 2014-07-03 DIAGNOSIS — M955 Acquired deformity of pelvis: Secondary | ICD-10-CM | POA: Diagnosis not present

## 2014-07-03 DIAGNOSIS — M9905 Segmental and somatic dysfunction of pelvic region: Secondary | ICD-10-CM | POA: Diagnosis not present

## 2014-07-07 ENCOUNTER — Ambulatory Visit (INDEPENDENT_AMBULATORY_CARE_PROVIDER_SITE_OTHER): Payer: Commercial Managed Care - HMO | Admitting: Internal Medicine

## 2014-07-07 ENCOUNTER — Encounter: Payer: Self-pay | Admitting: Internal Medicine

## 2014-07-07 VITALS — BP 140/70 | HR 68 | Temp 98.2°F | Wt 183.0 lb

## 2014-07-07 DIAGNOSIS — L98491 Non-pressure chronic ulcer of skin of other sites limited to breakdown of skin: Secondary | ICD-10-CM

## 2014-07-07 DIAGNOSIS — G8929 Other chronic pain: Secondary | ICD-10-CM | POA: Diagnosis not present

## 2014-07-07 DIAGNOSIS — M9905 Segmental and somatic dysfunction of pelvic region: Secondary | ICD-10-CM | POA: Diagnosis not present

## 2014-07-07 DIAGNOSIS — M955 Acquired deformity of pelvis: Secondary | ICD-10-CM | POA: Diagnosis not present

## 2014-07-07 DIAGNOSIS — M549 Dorsalgia, unspecified: Secondary | ICD-10-CM

## 2014-07-07 DIAGNOSIS — M9903 Segmental and somatic dysfunction of lumbar region: Secondary | ICD-10-CM | POA: Diagnosis not present

## 2014-07-07 DIAGNOSIS — M5416 Radiculopathy, lumbar region: Secondary | ICD-10-CM | POA: Diagnosis not present

## 2014-07-07 DIAGNOSIS — L98499 Non-pressure chronic ulcer of skin of other sites with unspecified severity: Secondary | ICD-10-CM | POA: Insufficient documentation

## 2014-07-07 NOTE — Progress Notes (Signed)
Subjective:    Patient ID: Walter [name], male    DOB: May 25, 1931, 81 y.o.   MRN: [id number]  HPI Here due to ongoing problems with left arm wound  [address] and injured this in early March Has been getting dressed daily by RNs at [facility] Had skin tear and no skin to cover the wound  No fever Still having some yellow drainage  Current Outpatient Prescriptions on File Prior to Visit  Medication Sig Dispense Refill  . acetaminophen (TYLENOL) 500 MG tablet Take 1,000 mg by mouth daily as needed.    Marland Kitchen amLODipine (NORVASC) 5 MG tablet TAKE 1 TABLET EVERY DAY 90 tablet 1  . Cholecalciferol (VITAMIN D) 2000 UNITS CAPS Take by mouth daily.    Marland Kitchen levothyroxine (SYNTHROID, LEVOTHROID) 112 MCG tablet Take 1 tablet (112 mcg total) by mouth daily before breakfast. 90 tablet 2   No current facility-administered medications on file prior to visit.    Allergies  Allergen Reactions  . Quinolones     tendonitis    Past Medical History  Diagnosis Date  . Hypertension   . Oral cancer 2004    graft from left thigh  . Osteoarthritis of both knees   . DVT (deep venous thrombosis) 1998    after left knee arthroscopy  . ARF (acute renal failure) 2011    after TKR  . Toe amputation status 2010  . Tendonitis 2012    from quinolone  . BPH (benign prostatic hypertrophy)   . Kidney stones     recurrent  . Hypothyroid 2007    postop from thyroidectomy (cancer scare)    Past Surgical History  Procedure Laterality Date  . Cholecystectomy    . Appendectomy    . Oral cancer removed Right 2004  . Thyroidectomy  2007  . Umbilical hernia repair  2007  . Lithotripsy  2009/2010    then stent and cystoscopic removal 2014  . Total knee arthroplasty Left   . Toe amputation Right 2010    badly overlapping other toe  . Transurethral resection of prostate  2011  . Repair zenker's diverticula  2013    Family History  Problem Relation Age of Onset  . Stroke Father   . Heart disease Neg Hx   .  Diabetes Neg Hx     History   Social History  . Marital Status: Married    Spouse Name: N/A  . Number of Children: 3  . Years of Education: N/A   Occupational History  . 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 Smoking status: Former Smoker    Types: Cigarettes    Quit date: 04/27/2002  . Smokeless tobacco: Never Used  . Alcohol Use: Yes  . Drug Use: No  . Sexual Activity: Not on file   Other Topics Concern  . Not on file   Social History Narrative   Son works for [employer]   1 daughter in [place], other in [place]      Has living will   Wife, then son, would be health care POA   He isn't sure about DNR   No tube feeds if cognitively unaware   Review of Systems  Seeing chiropractor for his back--helping (Dr Joyce Copa) Got inserts from Dr Ammie Ferrier have helped      Objective:   Physical Exam  Skin:  ~15-35mm oval stage 2 ulcer proximal to left elbow Not inflamed No necrotic tissue  Not infected           Assessment & Plan:

## 2014-07-07 NOTE — Assessment & Plan Note (Signed)
Clean and not infected I suspect the borders are not active--thus not getting final granulation (since it is very superficial) Wet to dry dressing applied--- should continue this daily till he leaves on his cruise (5 days) Then he can switch back to daily neosporin

## 2014-07-07 NOTE — Progress Notes (Signed)
Pre visit review using our clinic review tool, if applicable. No additional management support is needed unless otherwise documented below in the visit note. 

## 2014-07-08 DIAGNOSIS — M5416 Radiculopathy, lumbar region: Secondary | ICD-10-CM | POA: Diagnosis not present

## 2014-07-08 DIAGNOSIS — M955 Acquired deformity of pelvis: Secondary | ICD-10-CM | POA: Diagnosis not present

## 2014-07-08 DIAGNOSIS — M9905 Segmental and somatic dysfunction of pelvic region: Secondary | ICD-10-CM | POA: Diagnosis not present

## 2014-07-08 DIAGNOSIS — M9903 Segmental and somatic dysfunction of lumbar region: Secondary | ICD-10-CM | POA: Diagnosis not present

## 2014-07-10 DIAGNOSIS — M955 Acquired deformity of pelvis: Secondary | ICD-10-CM | POA: Diagnosis not present

## 2014-07-10 DIAGNOSIS — M9905 Segmental and somatic dysfunction of pelvic region: Secondary | ICD-10-CM | POA: Diagnosis not present

## 2014-07-10 DIAGNOSIS — M5416 Radiculopathy, lumbar region: Secondary | ICD-10-CM | POA: Diagnosis not present

## 2014-07-10 DIAGNOSIS — M9903 Segmental and somatic dysfunction of lumbar region: Secondary | ICD-10-CM | POA: Diagnosis not present

## 2014-07-29 ENCOUNTER — Other Ambulatory Visit: Payer: Self-pay | Admitting: Internal Medicine

## 2014-07-30 DIAGNOSIS — M5416 Radiculopathy, lumbar region: Secondary | ICD-10-CM | POA: Diagnosis not present

## 2014-07-30 DIAGNOSIS — M9905 Segmental and somatic dysfunction of pelvic region: Secondary | ICD-10-CM | POA: Diagnosis not present

## 2014-07-30 DIAGNOSIS — M9903 Segmental and somatic dysfunction of lumbar region: Secondary | ICD-10-CM | POA: Diagnosis not present

## 2014-07-30 DIAGNOSIS — M955 Acquired deformity of pelvis: Secondary | ICD-10-CM | POA: Diagnosis not present

## 2014-08-01 DIAGNOSIS — M9905 Segmental and somatic dysfunction of pelvic region: Secondary | ICD-10-CM | POA: Diagnosis not present

## 2014-08-01 DIAGNOSIS — M9903 Segmental and somatic dysfunction of lumbar region: Secondary | ICD-10-CM | POA: Diagnosis not present

## 2014-08-01 DIAGNOSIS — M955 Acquired deformity of pelvis: Secondary | ICD-10-CM | POA: Diagnosis not present

## 2014-08-01 DIAGNOSIS — M5416 Radiculopathy, lumbar region: Secondary | ICD-10-CM | POA: Diagnosis not present

## 2014-08-04 ENCOUNTER — Encounter: Payer: Self-pay | Admitting: Internal Medicine

## 2014-08-04 ENCOUNTER — Ambulatory Visit (INDEPENDENT_AMBULATORY_CARE_PROVIDER_SITE_OTHER): Payer: Commercial Managed Care - HMO | Admitting: Internal Medicine

## 2014-08-04 VITALS — BP 128/78 | HR 65 | Temp 97.7°F | Ht 70.0 in | Wt 183.0 lb

## 2014-08-04 DIAGNOSIS — Z0000 Encounter for general adult medical examination without abnormal findings: Secondary | ICD-10-CM | POA: Diagnosis not present

## 2014-08-04 DIAGNOSIS — E039 Hypothyroidism, unspecified: Secondary | ICD-10-CM

## 2014-08-04 DIAGNOSIS — N400 Enlarged prostate without lower urinary tract symptoms: Secondary | ICD-10-CM | POA: Diagnosis not present

## 2014-08-04 DIAGNOSIS — I10 Essential (primary) hypertension: Secondary | ICD-10-CM | POA: Diagnosis not present

## 2014-08-04 DIAGNOSIS — R269 Unspecified abnormalities of gait and mobility: Secondary | ICD-10-CM | POA: Diagnosis not present

## 2014-08-04 DIAGNOSIS — Z89421 Acquired absence of other right toe(s): Secondary | ICD-10-CM | POA: Diagnosis not present

## 2014-08-04 DIAGNOSIS — G20 Parkinson's disease: Secondary | ICD-10-CM | POA: Insufficient documentation

## 2014-08-04 DIAGNOSIS — IMO0002 Reserved for concepts with insufficient information to code with codable children: Secondary | ICD-10-CM

## 2014-08-04 LAB — COMPREHENSIVE METABOLIC PANEL
ALT: 11 U/L (ref 0–53)
AST: 17 U/L (ref 0–37)
Albumin: 4.4 g/dL (ref 3.5–5.2)
Alkaline Phosphatase: 77 U/L (ref 39–117)
BILIRUBIN TOTAL: 1.1 mg/dL (ref 0.2–1.2)
BUN: 22 mg/dL (ref 6–23)
CHLORIDE: 104 meq/L (ref 96–112)
CO2: 31 mEq/L (ref 19–32)
Calcium: 9.5 mg/dL (ref 8.4–10.5)
Creatinine, Ser: 1.58 mg/dL — ABNORMAL HIGH (ref 0.40–1.50)
GFR: 44.91 mL/min — ABNORMAL LOW (ref >60.00)
GLUCOSE: 95 mg/dL (ref 70–99)
POTASSIUM: 4.3 meq/L (ref 3.5–5.1)
SODIUM: 140 meq/L (ref 135–145)
TOTAL PROTEIN: 7.1 g/dL (ref 6.0–8.3)

## 2014-08-04 LAB — CBC WITH DIFFERENTIAL/PLATELET
Basophils Absolute: 0 10*3/uL (ref 0.0–0.1)
Basophils Relative: 0.5 % (ref 0.0–3.0)
Eosinophils Absolute: 0.1 10*3/uL (ref 0.0–0.7)
Eosinophils Relative: 1.3 % (ref 0.0–5.0)
HEMATOCRIT: 47.2 % (ref 39.0–52.0)
Hemoglobin: 15.7 g/dL (ref 13.0–17.0)
LYMPHS ABS: 1.4 10*3/uL (ref 0.7–4.0)
Lymphocytes Relative: 19.7 % (ref 12.0–46.0)
MCHC: 33.2 g/dL (ref 30.0–36.0)
MCV: 92.6 fl (ref 78.0–100.0)
Monocytes Absolute: 0.6 10*3/uL (ref 0.1–1.0)
Monocytes Relative: 8.9 % (ref 3.0–12.0)
Neutro Abs: 5 10*3/uL (ref 1.4–7.7)
Neutrophils Relative %: 69.6 % (ref 43.0–77.0)
Platelets: 224 10*3/uL (ref 150.0–400.0)
RBC: 5.09 Mil/uL (ref 4.22–5.81)
RDW: 13.6 % (ref 11.5–15.5)
WBC: 7.2 10*3/uL (ref 4.0–10.5)

## 2014-08-04 LAB — T4, FREE: Free T4: 1.08 ng/dL (ref 0.60–1.60)

## 2014-08-04 LAB — TSH: TSH: 0.84 u[IU]/mL (ref 0.35–4.50)

## 2014-08-04 LAB — VITAMIN B12: Vitamin B-12: 192 pg/mL — ABNORMAL LOW (ref 211–911)

## 2014-08-04 MED ORDER — CARBIDOPA-LEVODOPA 25-100 MG PO TABS: 1.0000 | ORAL_TABLET | Freq: Three times a day (TID) | ORAL | Status: DC

## 2014-08-04 NOTE — Progress Notes (Signed)
Subjective:    Patient ID: Walter [name], male    DOB: [birth year], 81 y.o.   MRN: [id number]  HPI Here for Medicare wellness and follow up of chronic medical conditions Reviewed form and advanced directives Reviewed other physicians Does have occasional alcohol drink-- 1 at most daily No tobacco Wife notices his hearing is worse-- some ringing also Vision is okay Did have fall with injury No depression or anhedonia Independent in instrumental ADLs No apparent cognitive problems  Did try wet to dry wrap on his elbow and it helped This did seem to activate the borders Now completely granulated  Sciatic pain and back pain better  Went to chiropractor---Walter [name] Very satisfied Still has problems with balance-- and strength overall Some improvement with inserts from Walter [name] Still shuffling and not stepping No tremor Ongoing stiffness in left knee since TKR and extended recovery Handwriting is messier but not smaller Once he starts walking--hard to stop Does use a cane sometimes--this helped his balance and he could walk some better  No chest pain No dizziness or syncope No SOB No edema No palpitations  Voids okay Still with frequency, especially at night (3-4 per night) Reasonable stream--no major dribbling Only got brief relief from TURP  Appetite is okay No change in hair, skin or nails Weight stable  Current Outpatient Prescriptions on File Prior to Visit  Medication Sig Dispense Refill  . acetaminophen (TYLENOL) 500 MG tablet Take 1,000 mg by mouth daily as needed.    Marland Kitchen amLODipine (NORVASC) 5 MG tablet TAKE 1 TABLET EVERY DAY 90 tablet 1  . Cholecalciferol (VITAMIN D) 2000 UNITS CAPS Take by mouth daily.    Marland Kitchen levothyroxine (SYNTHROID, LEVOTHROID) 112 MCG tablet TAKE 1 TABLET EVERY DAY BEFORE BREAKFAST 90 tablet 0   No current facility-administered medications on file prior to visit.    Allergies  Allergen Reactions  . Quinolones     tendonitis    Past  Medical History  Diagnosis Date  . Hypertension   . Oral cancer 2004    graft from left thigh  . Osteoarthritis of both knees   . DVT (deep venous thrombosis) 1998    after left knee arthroscopy  . ARF (acute renal failure) 2011    after TKR  . Toe amputation status 2010  . Tendonitis 2012    from quinolone  . BPH (benign prostatic hypertrophy)   . Kidney stones     recurrent  . Hypothyroid 2007    postop from thyroidectomy (cancer scare)    Past Surgical History  Procedure Laterality Date  . Cholecystectomy    . Appendectomy    . Oral cancer removed Right 2004  . Thyroidectomy  2007  . Umbilical hernia repair  2007  . Lithotripsy  2009/2010    then stent and cystoscopic removal 2014  . Total knee arthroplasty Left   . Toe amputation Right 2010    badly overlapping other toe  . Transurethral resection of prostate  2011  . Repair zenker's diverticula  2013    Family History  Problem Relation Age of Onset  . Stroke Father   . Heart disease Neg Hx   . Diabetes Neg Hx     History   Social History  . Marital Status: Married    Spouse Name: N/A  . Number of Children: 3  . Years of Education: N/A   Occupational History  . 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 Smoking status: Former Smoker    Types: Cigarettes    Quit date: 04/27/2002  . Smokeless tobacco: Never Used  . Alcohol Use: Yes  . Drug Use: No  . Sexual Activity: Not on file   Other Topics Concern  . Not on file   Social History Narrative   Son works for [employer]   1 daughter in [place], other in [place]      Has living will   Wife, then son, would be health care POA   He isn't sure about DNR   No tube feeds if cognitively unaware   Review of Systems Right 2nd toe amputation doesn't bother him. Toes seem to curl on him. Uses a spacer Walks better in flip-flops Sleeps well Bowels are fine--does use stool softener or  miralax at times (eats prunes also) 6 teeth removed in December--now with partial Wears seat belt    Objective:   Physical Exam  Constitutional: He is oriented to person, place, and time. He appears well-developed and well-nourished. No distress.  HENT:  Mouth/Throat: Oropharynx is clear and moist. No oropharyngeal exudate.  Neck: Normal range of motion. Neck supple. No thyromegaly present.  Cardiovascular: Normal rate, regular rhythm, normal heart sounds and intact distal pulses.  Exam reveals no gallop.   No murmur heard. Pulmonary/Chest: Effort normal and breath sounds normal. No respiratory distress. He has no wheezes. He has no rales.  Abdominal: Soft. There is no tenderness.  Musculoskeletal:  Trace pedal edema  Lymphadenopathy:    He has no cervical adenopathy.  Neurological: He is alert and oriented to person, place, and time.  President-- "Walter [name], Walter [name], ?" [id number] Recall 3/3  Skin:  Overlying toes Hammer toe right 3rd 2nd toe absent  Psychiatric: He has a normal mood and affect. His behavior is normal.          Assessment & Plan:

## 2014-08-04 NOTE — Assessment & Plan Note (Signed)
Has shuffling gait, somewhat masked facies and mild bradykinesia. No tremor Could be early Parkinson's Will try sinemet and follow up Consider neuro consultations

## 2014-08-04 NOTE — Assessment & Plan Note (Signed)
Doesn't seem to be adding to his gait problems

## 2014-08-04 NOTE — Assessment & Plan Note (Signed)
Ongoing symptoms Would avoid meds for now

## 2014-08-04 NOTE — Assessment & Plan Note (Signed)
BP Readings from Last 3 Encounters:  08/04/14 128/78  07/07/14 140/70  06/09/14 140/80   Good control

## 2014-08-04 NOTE — Assessment & Plan Note (Signed)
I have personally reviewed the Medicare Annual Wellness questionnaire and have noted 1. The patient's medical and social history 2. Their use of alcohol, tobacco or illicit drugs 3. Their current medications and supplements 4. The patient's functional ability including ADL's, fall risks, home safety risks and hearing or visual             impairment. 5. Diet and physical activities 6. Evidence for depression or mood disorders  The patients weight, height, BMI and visual acuity have been recorded in the chart I have made referrals, counseling and provided education to the patient based review of the above and I have provided the pt with a written personalized care plan for preventive services.  I have provided you with a copy of your personalized plan for preventive services. Please take the time to review along with your updated medication list.  No cancer screening due to age UTD with immunizations other than yearly flu vaccine Works on fitness

## 2014-08-04 NOTE — Assessment & Plan Note (Signed)
Seems euthyroid ?Will check labs ?

## 2014-08-04 NOTE — Progress Notes (Signed)
Pre visit review using our clinic review tool, if applicable. No additional management support is needed unless otherwise documented below in the visit note. 

## 2014-08-05 ENCOUNTER — Other Ambulatory Visit: Payer: Self-pay | Admitting: Internal Medicine

## 2014-08-05 DIAGNOSIS — E538 Deficiency of other specified B group vitamins: Secondary | ICD-10-CM

## 2014-08-06 DIAGNOSIS — M5416 Radiculopathy, lumbar region: Secondary | ICD-10-CM | POA: Diagnosis not present

## 2014-08-06 DIAGNOSIS — M9905 Segmental and somatic dysfunction of pelvic region: Secondary | ICD-10-CM | POA: Diagnosis not present

## 2014-08-06 DIAGNOSIS — M955 Acquired deformity of pelvis: Secondary | ICD-10-CM | POA: Diagnosis not present

## 2014-08-06 DIAGNOSIS — M9903 Segmental and somatic dysfunction of lumbar region: Secondary | ICD-10-CM | POA: Diagnosis not present

## 2014-08-11 DIAGNOSIS — M9903 Segmental and somatic dysfunction of lumbar region: Secondary | ICD-10-CM | POA: Diagnosis not present

## 2014-08-11 DIAGNOSIS — M5416 Radiculopathy, lumbar region: Secondary | ICD-10-CM | POA: Diagnosis not present

## 2014-08-11 DIAGNOSIS — M9905 Segmental and somatic dysfunction of pelvic region: Secondary | ICD-10-CM | POA: Diagnosis not present

## 2014-08-11 DIAGNOSIS — M955 Acquired deformity of pelvis: Secondary | ICD-10-CM | POA: Diagnosis not present

## 2014-08-14 DIAGNOSIS — M5416 Radiculopathy, lumbar region: Secondary | ICD-10-CM | POA: Diagnosis not present

## 2014-08-14 DIAGNOSIS — M9905 Segmental and somatic dysfunction of pelvic region: Secondary | ICD-10-CM | POA: Diagnosis not present

## 2014-08-14 DIAGNOSIS — M955 Acquired deformity of pelvis: Secondary | ICD-10-CM | POA: Diagnosis not present

## 2014-08-14 DIAGNOSIS — M9903 Segmental and somatic dysfunction of lumbar region: Secondary | ICD-10-CM | POA: Diagnosis not present

## 2014-08-25 ENCOUNTER — Other Ambulatory Visit (INDEPENDENT_AMBULATORY_CARE_PROVIDER_SITE_OTHER): Payer: Commercial Managed Care - HMO

## 2014-08-25 DIAGNOSIS — E538 Deficiency of other specified B group vitamins: Secondary | ICD-10-CM | POA: Diagnosis not present

## 2014-08-25 LAB — VITAMIN B12: VITAMIN B 12: 943 pg/mL — AB (ref 211–911)

## 2014-09-09 ENCOUNTER — Encounter: Payer: Self-pay | Admitting: Internal Medicine

## 2014-09-09 ENCOUNTER — Ambulatory Visit (INDEPENDENT_AMBULATORY_CARE_PROVIDER_SITE_OTHER): Payer: Commercial Managed Care - HMO | Admitting: Internal Medicine

## 2014-09-09 VITALS — BP 110/60 | HR 69 | Temp 97.7°F | Ht 70.0 in | Wt 184.0 lb

## 2014-09-09 DIAGNOSIS — E538 Deficiency of other specified B group vitamins: Secondary | ICD-10-CM

## 2014-09-09 DIAGNOSIS — G20 Parkinson's disease: Secondary | ICD-10-CM | POA: Diagnosis not present

## 2014-09-09 DIAGNOSIS — G20A1 Parkinson's disease without dyskinesia, without mention of fluctuations: Secondary | ICD-10-CM

## 2014-09-09 HISTORY — DX: Deficiency of other specified B group vitamins: E53.8

## 2014-09-09 MED ORDER — CARBIDOPA-LEVODOPA 25-100 MG PO TABS
1.0000 | ORAL_TABLET | Freq: Three times a day (TID) | ORAL | Status: DC
Start: 2014-09-09 — End: 2014-09-29

## 2014-09-09 NOTE — Progress Notes (Signed)
Pre visit review using our clinic review tool, if applicable. No additional management support is needed unless otherwise documented below in the visit note. 

## 2014-09-09 NOTE — Progress Notes (Signed)
Subjective:    Patient ID: Walter [name], male    DOB: May 11, 1931, 81 y.o.   MRN: [id number]  HPI Here for follow up of his gait disturbance  He noticed an immediate change with the sinemet He has held off on exercise since last visit--discussed No tremor--but wife thinks she sees a little in his hands Handwriting bad for ~5 years---is smaller Still notes some postural instability--can get leaning forward and has to consciously bring himself back under control  Does have some sedation over time from the sinemet Likes an afternoon nap daily now---was rare before  Current Outpatient Prescriptions on File Prior to Visit  Medication Sig Dispense Refill  . acetaminophen (TYLENOL) 500 MG tablet Take 1,000 mg by mouth daily as needed.    Marland Kitchen amLODipine (NORVASC) 5 MG tablet TAKE 1 TABLET EVERY DAY 90 tablet 1  . carbidopa-levodopa (SINEMET IR) 25-100 MG per tablet Take 1 tablet by mouth 3 (three) times daily. 90 tablet 1  . Cholecalciferol (VITAMIN D) 2000 UNITS CAPS Take by mouth daily.    Marland Kitchen levothyroxine (SYNTHROID, LEVOTHROID) 112 MCG tablet TAKE 1 TABLET EVERY DAY BEFORE BREAKFAST 90 tablet 0   No current facility-administered medications on file prior to visit.    Allergies  Allergen Reactions  . Quinolones     tendonitis    Past Medical History  Diagnosis Date  . Hypertension   . Oral cancer 2004    graft from left thigh  . Osteoarthritis of both knees   . DVT (deep venous thrombosis) 1998    after left knee arthroscopy  . ARF (acute renal failure) 2011    after TKR  . Toe amputation status 2010  . Tendonitis 2012    from quinolone  . BPH (benign prostatic hypertrophy)   . Kidney stones     recurrent  . Hypothyroid 2007    postop from thyroidectomy (cancer scare)    Past Surgical History  Procedure Laterality Date  . Cholecystectomy    . Appendectomy    . Oral cancer removed Right 2004  . Thyroidectomy  2007  . Umbilical hernia repair  2007  . Lithotripsy   2009/2010    then stent and cystoscopic removal 2014  . Total knee arthroplasty Left   . Toe amputation Right 2010    badly overlapping other toe  . Transurethral resection of prostate  2011  . Repair zenker's diverticula  2013    Family History  Problem Relation Age of Onset  . Stroke Father   . Heart disease Neg Hx   . Diabetes Neg Hx     History   Social History  . Marital Status: Married    Spouse Name: N/A  . Number of Children: 3  . Years of Education: N/A   Occupational History  . 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 Smoking status: Former Smoker    Types: Cigarettes    Quit date: 04/27/2002  . Smokeless tobacco: Never Used  . Alcohol Use: Yes  . Drug Use: No  . Sexual Activity: Not on file   Other Topics Concern  . Not on file   Social History Narrative   Son works for [employer]   1 daughter in [place], other in [place] living will   Wife, then son, would be health care POA   He isn't sure about DNR   No tube feeds  if cognitively unaware   Review of Systems Sleeps well No movement in bed Nocturia x 3-4 still--gets right back to sleep    Objective:   Physical Exam  Constitutional: He appears well-developed and well-nourished. No distress.  Neurological:  Gait is fairly normal No postural instability No sig tremor No rigidity No sig bradykinesia          Assessment & Plan:

## 2014-09-09 NOTE — Assessment & Plan Note (Signed)
Not sure if this contributed to gait problems Will continue the oral supplement

## 2014-09-09 NOTE — Assessment & Plan Note (Signed)
Quick response to sinemet Gait now better than in the past year No apparent side effects---but fatigues in the afternoon No clear cognitive problems at this point Discussed neurology evaluation --- will hold off He will get back to his exercise program

## 2014-09-15 ENCOUNTER — Encounter: Payer: Self-pay | Admitting: Internal Medicine

## 2014-09-15 ENCOUNTER — Ambulatory Visit (INDEPENDENT_AMBULATORY_CARE_PROVIDER_SITE_OTHER): Payer: Commercial Managed Care - HMO | Admitting: Internal Medicine

## 2014-09-15 VITALS — BP 138/82 | HR 65 | Temp 97.6°F | Wt 183.5 lb

## 2014-09-15 DIAGNOSIS — N200 Calculus of kidney: Secondary | ICD-10-CM | POA: Diagnosis not present

## 2014-09-15 DIAGNOSIS — M549 Dorsalgia, unspecified: Secondary | ICD-10-CM | POA: Diagnosis not present

## 2014-09-15 NOTE — Progress Notes (Signed)
Subjective:    Patient ID: Walter [name], male    DOB: Jun 16, 1931, 81 y.o.   MRN: [id number]  HPI Here with wife "I had the worst night in years" Around 9:30 last night developed severe left back ("kidney") pain Unable to get comfortable Walked out to get paper around 6:30AM--- and pain was gone  Pain was reminiscent of past kidney stone pain Some nausea No radiation of pain Voided small amounts during this time--no hematuria No dysuria  No new activities No apparent injury  2 lithotripsies in the past---last 3 years ago or so Also had ureteroscopy and stone removal in past  Current Outpatient Prescriptions on File Prior to Visit  Medication Sig Dispense Refill  . acetaminophen (TYLENOL) 500 MG tablet Take 1,000 mg by mouth daily as needed.    Marland Kitchen amLODipine (NORVASC) 5 MG tablet TAKE 1 TABLET EVERY DAY 90 tablet 1  . carbidopa-levodopa (SINEMET IR) 25-100 MG per tablet Take 1 tablet by mouth 3 (three) times daily. 270 tablet 3  . Cholecalciferol (VITAMIN D) 2000 UNITS CAPS Take by mouth daily.    Marland Kitchen levothyroxine (SYNTHROID, LEVOTHROID) 112 MCG tablet TAKE 1 TABLET EVERY DAY BEFORE BREAKFAST 90 tablet 0  . vitamin B-12 (CYANOCOBALAMIN) 1000 MCG tablet Take 1,000 mcg by mouth daily.     No current facility-administered medications on file prior to visit.    Allergies  Allergen Reactions  . Quinolones     tendonitis    Past Medical History  Diagnosis Date  . Hypertension   . Oral cancer 2004    graft from left thigh  . Osteoarthritis of both knees   . DVT (deep venous thrombosis) 1998    after left knee arthroscopy  . ARF (acute renal failure) 2011    after TKR  . Toe amputation status 2010  . Tendonitis 2012    from quinolone  . BPH (benign prostatic hypertrophy)   . Kidney stones     recurrent  . Hypothyroid 2007    postop from thyroidectomy (cancer scare)    Past Surgical History  Procedure Laterality Date  . Cholecystectomy    . Appendectomy    . Oral  cancer removed Right 2004  . Thyroidectomy  2007  . Umbilical hernia repair  2007  . Lithotripsy  2009/2010    then stent and cystoscopic removal 2014  . Total knee arthroplasty Left   . Toe amputation Right 2010    badly overlapping other toe  . Transurethral resection of prostate  2011  . Repair zenker's diverticula  2013    Family History  Problem Relation Age of Onset  . Stroke Father   . Heart disease Neg Hx   . Diabetes Neg Hx     History   Social History  . Marital Status: Married    Spouse Name: N/A  . Number of Children: 3  . Years of Education: N/A   Occupational History  . 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 Smoking status: Former Smoker    Types: Cigarettes    Quit date: 04/27/2002  . Smokeless tobacco: Never Used  . Alcohol Use: Yes  . Drug Use: No  . Sexual Activity: Not on file   Other Topics Concern  . Not on file   Social History Narrative   Son works for [employer]   1 daughter in [place], other in [place] living  will   Wife, then son, would be health care POA   He isn't sure about DNR   No tube feeds if cognitively unaware   Review of Systems  No fever Has acidic aftertaste in mouth now     Objective:   Physical Exam  Constitutional: He appears well-developed and well-nourished. No distress.  Abdominal: Soft. There is no tenderness.  Musculoskeletal:  No CVA tenderness          Assessment & Plan:

## 2014-09-15 NOTE — Progress Notes (Signed)
Pre visit review using our clinic review tool, if applicable. No additional management support is needed unless otherwise documented below in the visit note. 

## 2014-09-15 NOTE — Assessment & Plan Note (Signed)
Feels like he may have passed a stone--but no hematuria Did see Dr Erlene Quan ---- thinks KUB was negative Symptoms have completely resolved Not sure if further action needed---has known calcium stones but never on citrate Will send note to Dr Erlene Quan

## 2014-09-15 NOTE — Patient Instructions (Signed)
It may be good to increase citrate containing food and drinks. (2 ounces of concentrated lemon juice in 1 quart water daily)

## 2014-09-27 ENCOUNTER — Other Ambulatory Visit: Payer: Self-pay | Admitting: Internal Medicine

## 2014-10-17 ENCOUNTER — Other Ambulatory Visit: Payer: Self-pay

## 2014-10-17 MED ORDER — AMLODIPINE BESYLATE 5 MG PO TABS: 5.0000 mg | ORAL_TABLET | Freq: Every day | ORAL | Status: DC

## 2014-10-17 MED ORDER — LEVOTHYROXINE SODIUM 112 MCG PO TABS: ORAL_TABLET | ORAL | Status: DC

## 2014-10-17 MED ORDER — CARBIDOPA-LEVODOPA 25-100 MG PO TABS: ORAL_TABLET | ORAL | Status: DC

## 2014-10-17 NOTE — Telephone Encounter (Signed)
Mrs Crumble said pt forgot his medication and pt will be in [place] for 5 days and request 5 day rx for amlodipine, carbidopa-levodopa and levothyroxine to [facility] Advised Mrs Fernando [facility] signed) done.

## 2014-12-10 ENCOUNTER — Other Ambulatory Visit: Payer: Self-pay | Admitting: Internal Medicine

## 2014-12-30 ENCOUNTER — Ambulatory Visit (INDEPENDENT_AMBULATORY_CARE_PROVIDER_SITE_OTHER): Payer: Commercial Managed Care - HMO

## 2014-12-30 DIAGNOSIS — Z23 Encounter for immunization: Secondary | ICD-10-CM

## 2015-03-10 ENCOUNTER — Encounter: Payer: Self-pay | Admitting: Internal Medicine

## 2015-03-10 ENCOUNTER — Ambulatory Visit (INDEPENDENT_AMBULATORY_CARE_PROVIDER_SITE_OTHER): Payer: Commercial Managed Care - HMO | Admitting: Internal Medicine

## 2015-03-10 VITALS — BP 128/70 | HR 63 | Temp 97.6°F | Wt 184.0 lb

## 2015-03-10 DIAGNOSIS — Z89422 Acquired absence of other left toe(s): Secondary | ICD-10-CM

## 2015-03-10 DIAGNOSIS — N400 Enlarged prostate without lower urinary tract symptoms: Secondary | ICD-10-CM | POA: Diagnosis not present

## 2015-03-10 DIAGNOSIS — G20 Parkinson's disease: Secondary | ICD-10-CM | POA: Diagnosis not present

## 2015-03-10 DIAGNOSIS — E538 Deficiency of other specified B group vitamins: Secondary | ICD-10-CM

## 2015-03-10 DIAGNOSIS — IMO0002 Reserved for concepts with insufficient information to code with codable children: Secondary | ICD-10-CM

## 2015-03-10 NOTE — Assessment & Plan Note (Signed)
Doing well on he sinemet Still comfortable holding off on neurology evaluation

## 2015-03-10 NOTE — Progress Notes (Signed)
Pre visit review using our clinic review tool, if applicable. No additional management support is needed unless otherwise documented below in the visit note. 

## 2015-03-10 NOTE — Progress Notes (Signed)
Subjective:    Patient ID: Walter [name], male    DOB: October 06, 1931, 82 y.o.   MRN: [id number]  HPI Here for follow up of apparent Parkinson's disease  Has felt better with the medication Able to do more--even Tai Chi Was better so stopped the B12 and vitamin D on his own---he may have slipped some Urged him to go back on these since his balance has worsened in the months since he stopped  Not much exercise other than the Tai Chi--but some other exercises No problems with ADLs but trouble dressing due to lack of leg flexibility (like hard to get socks on) Does much of the instrumental ADLs  Right 2nd toe amputation in past May still affect his balance  Current Outpatient Prescriptions on File Prior to Visit  Medication Sig Dispense Refill  . acetaminophen (TYLENOL) 500 MG tablet Take 1,000 mg by mouth daily as needed.    Marland Kitchen amLODipine (NORVASC) 5 MG tablet Take 1 tablet (5 mg total) by mouth daily. 5 tablet 0  . carbidopa-levodopa (SINEMET IR) 25-100 MG per tablet TAKE 1 TABLET BY MOUTH 3 (THREE) TIMES DAILY. 15 tablet 0  . Cholecalciferol (VITAMIN D) 2000 UNITS CAPS Take by mouth daily.    Marland Kitchen levothyroxine (SYNTHROID, LEVOTHROID) 112 MCG tablet TAKE 1 TABLET EVERY DAY BEFORE BREAKFAST 90 tablet 3  . vitamin B-12 (CYANOCOBALAMIN) 1000 MCG tablet Take 1,000 mcg by mouth daily.     No current facility-administered medications on file prior to visit.    Allergies  Allergen Reactions  . Quinolones     tendonitis    Past Medical History  Diagnosis Date  . Hypertension   . Oral cancer ([place]) 2004    graft from left thigh  . Osteoarthritis of both knees   . DVT (deep venous thrombosis) ([place]) 1998    after left knee arthroscopy  . ARF (acute renal failure) ([place]) 2011    after TKR  . Toe amputation status ([place]) 2010  . Tendonitis 2012    from quinolone  . BPH (benign prostatic hypertrophy)   . Kidney stones     recurrent  . Hypothyroid 2007    postop from thyroidectomy (cancer  scare)    Past Surgical History  Procedure Laterality Date  . Cholecystectomy    . Appendectomy    . Oral cancer removed Right 2004  . Thyroidectomy  2007  . Umbilical hernia repair  2007  . Lithotripsy  2009/2010    then stent and cystoscopic removal 2014  . Total knee arthroplasty Left   . Toe amputation Right 2010    badly overlapping other toe  . Transurethral resection of prostate  2011  . Repair zenker's diverticula  2013    Family History  Problem Relation Age of Onset  . Stroke Father   . Heart disease Neg Hx   . Diabetes Neg Hx     Social History   Social History  . Marital Status: Married    Spouse Name: N/A  . Number of Children: 3  . Years of Education: N/A   Occupational History  . 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 Smoking status: Former Smoker    Types: Cigarettes    Quit date: 04/27/2002  . Smokeless tobacco: Never Used  . Alcohol Use: Yes  . Drug Use: No  . Sexual Activity: Not on file   Other Topics Concern  . Not on  file   Social History Narrative   Son works for [employer]   1 daughter in [place], other in [place]      Has living will   Wife, then son, would be health care POA   He isn't sure about DNR   No tube feeds if cognitively unaware   Review of Systems No recurrence of that severe back pain No hematuria or other urinary symptoms since then Sleeps well Appetite is fine Nocturia is stable at 3 per night    Objective:   Physical Exam  Constitutional: He appears well-developed and well-nourished. No distress.  Neck: Normal range of motion. Neck supple. No thyromegaly present.  Cardiovascular: Normal rate, regular rhythm and normal heart sounds.  Exam reveals no gallop.   No murmur heard. Pulmonary/Chest: Effort normal and breath sounds normal. No respiratory distress. He has no wheezes. He has no rales.  Musculoskeletal: He exhibits no edema.    Slight hand tremor and bradykinesia walking No stiffness  Lymphadenopathy:    He has no cervical adenopathy.  Psychiatric: He has a normal mood and affect.          Assessment & Plan:

## 2015-03-10 NOTE — Assessment & Plan Note (Signed)
Urged him to restart the supplements

## 2015-03-10 NOTE — Assessment & Plan Note (Signed)
Stable status--mostly just unchanged nocturia

## 2015-03-10 NOTE — Assessment & Plan Note (Signed)
No change or problems with site Does affect his balance a little

## 2015-03-18 ENCOUNTER — Other Ambulatory Visit: Payer: Self-pay | Admitting: Internal Medicine

## 2015-04-03 ENCOUNTER — Ambulatory Visit: Payer: Self-pay | Admitting: Urology

## 2015-04-06 DIAGNOSIS — M9903 Segmental and somatic dysfunction of lumbar region: Secondary | ICD-10-CM | POA: Diagnosis not present

## 2015-04-06 DIAGNOSIS — M9905 Segmental and somatic dysfunction of pelvic region: Secondary | ICD-10-CM | POA: Diagnosis not present

## 2015-04-06 DIAGNOSIS — M955 Acquired deformity of pelvis: Secondary | ICD-10-CM | POA: Diagnosis not present

## 2015-04-06 DIAGNOSIS — M5416 Radiculopathy, lumbar region: Secondary | ICD-10-CM | POA: Diagnosis not present

## 2015-04-08 DIAGNOSIS — M9903 Segmental and somatic dysfunction of lumbar region: Secondary | ICD-10-CM | POA: Diagnosis not present

## 2015-04-08 DIAGNOSIS — M5416 Radiculopathy, lumbar region: Secondary | ICD-10-CM | POA: Diagnosis not present

## 2015-04-08 DIAGNOSIS — M955 Acquired deformity of pelvis: Secondary | ICD-10-CM | POA: Diagnosis not present

## 2015-04-08 DIAGNOSIS — M9905 Segmental and somatic dysfunction of pelvic region: Secondary | ICD-10-CM | POA: Diagnosis not present

## 2015-04-09 DIAGNOSIS — M5416 Radiculopathy, lumbar region: Secondary | ICD-10-CM | POA: Diagnosis not present

## 2015-04-09 DIAGNOSIS — M9905 Segmental and somatic dysfunction of pelvic region: Secondary | ICD-10-CM | POA: Diagnosis not present

## 2015-04-09 DIAGNOSIS — M9903 Segmental and somatic dysfunction of lumbar region: Secondary | ICD-10-CM | POA: Diagnosis not present

## 2015-04-09 DIAGNOSIS — M955 Acquired deformity of pelvis: Secondary | ICD-10-CM | POA: Diagnosis not present

## 2015-04-13 DIAGNOSIS — M955 Acquired deformity of pelvis: Secondary | ICD-10-CM | POA: Diagnosis not present

## 2015-04-13 DIAGNOSIS — M5416 Radiculopathy, lumbar region: Secondary | ICD-10-CM | POA: Diagnosis not present

## 2015-04-13 DIAGNOSIS — M9905 Segmental and somatic dysfunction of pelvic region: Secondary | ICD-10-CM | POA: Diagnosis not present

## 2015-04-13 DIAGNOSIS — M9903 Segmental and somatic dysfunction of lumbar region: Secondary | ICD-10-CM | POA: Diagnosis not present

## 2015-04-15 DIAGNOSIS — M9903 Segmental and somatic dysfunction of lumbar region: Secondary | ICD-10-CM | POA: Diagnosis not present

## 2015-04-15 DIAGNOSIS — M5416 Radiculopathy, lumbar region: Secondary | ICD-10-CM | POA: Diagnosis not present

## 2015-04-15 DIAGNOSIS — M9905 Segmental and somatic dysfunction of pelvic region: Secondary | ICD-10-CM | POA: Diagnosis not present

## 2015-04-15 DIAGNOSIS — M955 Acquired deformity of pelvis: Secondary | ICD-10-CM | POA: Diagnosis not present

## 2015-04-20 DIAGNOSIS — M5416 Radiculopathy, lumbar region: Secondary | ICD-10-CM | POA: Diagnosis not present

## 2015-04-20 DIAGNOSIS — M9903 Segmental and somatic dysfunction of lumbar region: Secondary | ICD-10-CM | POA: Diagnosis not present

## 2015-04-20 DIAGNOSIS — M955 Acquired deformity of pelvis: Secondary | ICD-10-CM | POA: Diagnosis not present

## 2015-04-20 DIAGNOSIS — M9905 Segmental and somatic dysfunction of pelvic region: Secondary | ICD-10-CM | POA: Diagnosis not present

## 2015-05-06 ENCOUNTER — Encounter: Payer: Self-pay | Admitting: Primary Care

## 2015-05-06 ENCOUNTER — Ambulatory Visit (INDEPENDENT_AMBULATORY_CARE_PROVIDER_SITE_OTHER): Payer: Commercial Managed Care - HMO | Admitting: Primary Care

## 2015-05-06 VITALS — BP 124/86 | HR 68 | Temp 97.9°F | Wt 180.2 lb

## 2015-05-06 DIAGNOSIS — J069 Acute upper respiratory infection, unspecified: Secondary | ICD-10-CM

## 2015-05-06 NOTE — Patient Instructions (Signed)
Your symptoms are related to a viral infection that will pass on its own over time.  Cough/Congestion: Try taking Mucinex DM. This will help loosen up the mucous in your chest. Ensure you take this medication with a full glass of water.  You may also try Robitussin or Delsym.  Please notify me if you develop persistent fevers of 101, start coughing up green mucous, or feel worse after 1 week of onset of symptoms.   Increase consumption of water intake and rest.  Upper Respiratory Infection, Adult Most upper respiratory infections (URIs) are a viral infection of the air passages leading to the lungs. A URI affects the nose, throat, and upper air passages. The most common type of URI is nasopharyngitis and is typically referred to as "the common cold." URIs run their course and usually go away on their own. Most of the time, a URI does not require medical attention, but sometimes a bacterial infection in the upper airways can follow a viral infection. This is called a secondary infection. Sinus and middle ear infections are common types of secondary upper respiratory infections. Bacterial pneumonia can also complicate a URI. A URI can worsen asthma and chronic obstructive pulmonary disease (COPD). Sometimes, these complications can require emergency medical care and may be life threatening.  CAUSES Almost all URIs are caused by viruses. A virus is a type of germ and can spread from one person to another.  RISKS FACTORS You may be at risk for a URI if:   You smoke.   You have chronic heart or lung disease.  You have a weakened defense (immune) system.   You are very young or very old.   You have nasal allergies or asthma.  You work in crowded or poorly ventilated areas.  You work in health care facilities or schools. SIGNS AND SYMPTOMS  Symptoms typically develop 2-3 days after you come in contact with a cold virus. Most viral URIs last 7-10 days. However, viral URIs from the  influenza virus (flu virus) can last 14-18 days and are typically more severe. Symptoms may include:   Runny or stuffy (congested) nose.   Sneezing.   Cough.   Sore throat.   Headache.   Fatigue.   Fever.   Loss of appetite.   Pain in your forehead, behind your eyes, and over your cheekbones (sinus pain).  Muscle aches.  DIAGNOSIS  Your health care provider may diagnose a URI by:  Physical exam.  Tests to check that your symptoms are not due to another condition such as:  Strep throat.  Sinusitis.  Pneumonia.  Asthma. TREATMENT  A URI goes away on its own with time. It cannot be cured with medicines, but medicines may be prescribed or recommended to relieve symptoms. Medicines may help:  Reduce your fever.  Reduce your cough.  Relieve nasal congestion. HOME CARE INSTRUCTIONS   Take medicines only as directed by your health care provider.   Gargle warm saltwater or take cough drops to comfort your throat as directed by your health care provider.  Use a warm mist humidifier or inhale steam from a shower to increase air moisture. This may make it easier to breathe.  Drink enough fluid to keep your urine clear or pale yellow.   Eat soups and other clear broths and maintain good nutrition.   Rest as needed.   Return to work when your temperature has returned to normal or as your health care provider advises. You may need to stay  home longer to avoid infecting others. You can also use a face mask and careful hand washing to prevent spread of the virus.  Increase the usage of your inhaler if you have asthma.   Do not use any tobacco products, including cigarettes, chewing tobacco, or electronic cigarettes. If you need help quitting, ask your health care provider. PREVENTION  The best way to protect yourself from getting a cold is to practice good hygiene.   Avoid oral or hand contact with people with cold symptoms.   Wash your hands often if  contact occurs.  There is no clear evidence that vitamin C, vitamin E, echinacea, or exercise reduces the chance of developing a cold. However, it is always recommended to get plenty of rest, exercise, and practice good nutrition.  SEEK MEDICAL CARE IF:   You are getting worse rather than better.   Your symptoms are not controlled by medicine.   You have chills.  You have worsening shortness of breath.  You have brown or red mucus.  You have yellow or brown nasal discharge.  You have pain in your face, especially when you bend forward.  You have a fever.  You have swollen neck glands.  You have pain while swallowing.  You have white areas in the back of your throat. SEEK IMMEDIATE MEDICAL CARE IF:   You have severe or persistent:  Headache.  Ear pain.  Sinus pain.  Chest pain.  You have chronic lung disease and any of the following:  Wheezing.  Prolonged cough.  Coughing up blood.  A change in your usual mucus.  You have a stiff neck.  You have changes in your:  Vision.  Hearing.  Thinking.  Mood. MAKE SURE YOU:   Understand these instructions.  Will watch your condition.  Will get help right away if you are not doing well or get worse.   This information is not intended to replace advice given to you by your health care provider. Make sure you discuss any questions you have with your health care provider.   Document Released: 08/10/2000 Document Revised: 07/01/2014 Document Reviewed: 05/22/2013 Elsevier Interactive Patient Education [employer].

## 2015-05-06 NOTE — Progress Notes (Signed)
Subjective:    Patient ID: Walter [name], male    DOB: May 30, 1931, 82 y.o.   MRN: [id number]  HPI  Mr. Michalczyk is an 82 year old male who presents today with a chief complaint of cough. He also reports night sweats, sore throat, congestion, and fatigue. His symptoms began Saturday (4 days ago). His cough is non productive. He was on an airplance last Wednesday sitting in front of someone who was coughing for the duration of the flight. He's taking alka-selzer plus with some improvement. Overall he's feeling improved. Denies other sick contacts, nausea, body aches.  Review of Systems  Constitutional: Negative for fever.  HENT: Positive for congestion.   Respiratory: Positive for cough. Negative for shortness of breath.   Cardiovascular: Negative for chest pain.  Gastrointestinal: Negative for nausea.  Musculoskeletal: Negative for myalgias.       Past Medical History  Diagnosis Date  . Hypertension   . Oral cancer ([place]) 2004    graft from left thigh  . Osteoarthritis of both knees   . DVT (deep venous thrombosis) ([place]) 1998    after left knee arthroscopy  . ARF (acute renal failure) ([place]) 2011    after TKR  . Toe amputation status ([place]) 2010  . Tendonitis 2012    from quinolone  . BPH (benign prostatic hypertrophy)   . Kidney stones     recurrent  . Hypothyroid 2007    postop from thyroidectomy (cancer scare)    Social History   Social History  . Marital Status: Married    Spouse Name: N/A  . Number of Children: 3  . Years of Education: N/A   Occupational History  . 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 Smoking status: Former Smoker    Types: Cigarettes    Quit date: 04/27/2002  . Smokeless tobacco: Never Used  . Alcohol Use: 0.0 oz/week    0 Standard drinks or equivalent per week     Comment: occ  . Drug Use: No  . Sexual Activity: Not on file   Other Topics Concern  . Not on file    Social History Narrative   Son works for [employer]   1 daughter in [place], other in [place]      Has living will   Wife, then son, would be health care POA   He isn't sure about DNR   No tube feeds if cognitively unaware    Past Surgical History  Procedure Laterality Date  . Cholecystectomy    . Appendectomy    . Oral cancer removed Right 2004  . Thyroidectomy  2007  . Umbilical hernia repair  2007  . Lithotripsy  2009/2010    then stent and cystoscopic removal 2014  . Total knee arthroplasty Left   . Toe amputation Right 2010    badly overlapping other toe  . Transurethral resection of prostate  2011  . Repair zenker's diverticula  2013    Family History  Problem Relation Age of Onset  . Stroke Father   . Heart disease Neg Hx   . Diabetes Neg Hx     Allergies  Allergen Reactions  . Quinolones     tendonitis    Current Outpatient Prescriptions on File Prior to Visit  Medication Sig Dispense Refill  . amLODipine (NORVASC) 5 MG tablet TAKE 1 TABLET EVERY DAY 90 tablet 1  . carbidopa-levodopa (SINEMET IR) 25-100 MG per  tablet TAKE 1 TABLET BY MOUTH 3 (THREE) TIMES DAILY. 15 tablet 0  . Cholecalciferol (VITAMIN D) 2000 UNITS CAPS Take by mouth daily.    Marland Kitchen levothyroxine (SYNTHROID, LEVOTHROID) 112 MCG tablet TAKE 1 TABLET EVERY DAY BEFORE BREAKFAST 90 tablet 3  . vitamin B-12 (CYANOCOBALAMIN) 1000 MCG tablet Take 1,000 mcg by mouth daily.     No current facility-administered medications on file prior to visit.    BP 124/86 mmHg  Pulse 68  Temp(Src) 97.9 F (36.6 C) (Oral)  Wt 180 lb 4 oz (81.761 kg)  SpO2 94%    Objective:   Physical Exam  Constitutional: He appears well-nourished. He does not appear ill.  HENT:  Right Ear: Tympanic membrane and ear canal normal.  Left Ear: Tympanic membrane and ear canal normal.  Nose: Right sinus exhibits no maxillary sinus tenderness and no frontal sinus tenderness. Left sinus exhibits no maxillary sinus tenderness  and no frontal sinus tenderness.  Mouth/Throat: Oropharynx is clear and moist.  Eyes: Conjunctivae are normal.  Neck: Neck supple.  Cardiovascular: Normal rate and regular rhythm.   Pulmonary/Chest: Effort normal and breath sounds normal. He has no wheezes. He has no rales.  Lymphadenopathy:    He has no cervical adenopathy.  Skin: Skin is warm and dry.          Assessment & Plan:  Viral URI:  Cough, nasal congestion, fatigue, weakness x 4 days. Overall feeling better. Exam with clear lungs, does not appear ill, HENT exam unremarkable. Suspect viral involvement at this point and will treat with supportive measures. Mucinex, Flonase, Delsym, fluids, rest.  Return precautions provided.

## 2015-05-06 NOTE — Progress Notes (Signed)
Pre visit review using our clinic review tool, if applicable. No additional management support is needed unless otherwise documented below in the visit note. 

## 2015-07-01 ENCOUNTER — Other Ambulatory Visit: Payer: Self-pay | Admitting: Internal Medicine

## 2015-07-06 DIAGNOSIS — M955 Acquired deformity of pelvis: Secondary | ICD-10-CM | POA: Diagnosis not present

## 2015-07-06 DIAGNOSIS — M9903 Segmental and somatic dysfunction of lumbar region: Secondary | ICD-10-CM | POA: Diagnosis not present

## 2015-07-06 DIAGNOSIS — M9905 Segmental and somatic dysfunction of pelvic region: Secondary | ICD-10-CM | POA: Diagnosis not present

## 2015-07-06 DIAGNOSIS — M5416 Radiculopathy, lumbar region: Secondary | ICD-10-CM | POA: Diagnosis not present

## 2015-07-08 DIAGNOSIS — M955 Acquired deformity of pelvis: Secondary | ICD-10-CM | POA: Diagnosis not present

## 2015-07-08 DIAGNOSIS — M9905 Segmental and somatic dysfunction of pelvic region: Secondary | ICD-10-CM | POA: Diagnosis not present

## 2015-07-08 DIAGNOSIS — M9903 Segmental and somatic dysfunction of lumbar region: Secondary | ICD-10-CM | POA: Diagnosis not present

## 2015-07-08 DIAGNOSIS — M5416 Radiculopathy, lumbar region: Secondary | ICD-10-CM | POA: Diagnosis not present

## 2015-07-10 DIAGNOSIS — M955 Acquired deformity of pelvis: Secondary | ICD-10-CM | POA: Diagnosis not present

## 2015-07-10 DIAGNOSIS — M9905 Segmental and somatic dysfunction of pelvic region: Secondary | ICD-10-CM | POA: Diagnosis not present

## 2015-07-10 DIAGNOSIS — M5416 Radiculopathy, lumbar region: Secondary | ICD-10-CM | POA: Diagnosis not present

## 2015-07-10 DIAGNOSIS — M9903 Segmental and somatic dysfunction of lumbar region: Secondary | ICD-10-CM | POA: Diagnosis not present

## 2015-07-13 DIAGNOSIS — M5416 Radiculopathy, lumbar region: Secondary | ICD-10-CM | POA: Diagnosis not present

## 2015-07-13 DIAGNOSIS — M955 Acquired deformity of pelvis: Secondary | ICD-10-CM | POA: Diagnosis not present

## 2015-07-13 DIAGNOSIS — M9905 Segmental and somatic dysfunction of pelvic region: Secondary | ICD-10-CM | POA: Diagnosis not present

## 2015-07-13 DIAGNOSIS — M9903 Segmental and somatic dysfunction of lumbar region: Secondary | ICD-10-CM | POA: Diagnosis not present

## 2015-07-15 DIAGNOSIS — M9903 Segmental and somatic dysfunction of lumbar region: Secondary | ICD-10-CM | POA: Diagnosis not present

## 2015-07-15 DIAGNOSIS — M5416 Radiculopathy, lumbar region: Secondary | ICD-10-CM | POA: Diagnosis not present

## 2015-07-15 DIAGNOSIS — M955 Acquired deformity of pelvis: Secondary | ICD-10-CM | POA: Diagnosis not present

## 2015-07-15 DIAGNOSIS — M9905 Segmental and somatic dysfunction of pelvic region: Secondary | ICD-10-CM | POA: Diagnosis not present

## 2015-07-17 DIAGNOSIS — M9905 Segmental and somatic dysfunction of pelvic region: Secondary | ICD-10-CM | POA: Diagnosis not present

## 2015-07-17 DIAGNOSIS — M5416 Radiculopathy, lumbar region: Secondary | ICD-10-CM | POA: Diagnosis not present

## 2015-07-17 DIAGNOSIS — M9903 Segmental and somatic dysfunction of lumbar region: Secondary | ICD-10-CM | POA: Diagnosis not present

## 2015-07-17 DIAGNOSIS — M955 Acquired deformity of pelvis: Secondary | ICD-10-CM | POA: Diagnosis not present

## 2015-07-20 DIAGNOSIS — M5416 Radiculopathy, lumbar region: Secondary | ICD-10-CM | POA: Diagnosis not present

## 2015-07-20 DIAGNOSIS — M9905 Segmental and somatic dysfunction of pelvic region: Secondary | ICD-10-CM | POA: Diagnosis not present

## 2015-07-20 DIAGNOSIS — M9903 Segmental and somatic dysfunction of lumbar region: Secondary | ICD-10-CM | POA: Diagnosis not present

## 2015-07-20 DIAGNOSIS — M955 Acquired deformity of pelvis: Secondary | ICD-10-CM | POA: Diagnosis not present

## 2015-07-29 DIAGNOSIS — M9903 Segmental and somatic dysfunction of lumbar region: Secondary | ICD-10-CM | POA: Diagnosis not present

## 2015-07-29 DIAGNOSIS — M5416 Radiculopathy, lumbar region: Secondary | ICD-10-CM | POA: Diagnosis not present

## 2015-07-29 DIAGNOSIS — M955 Acquired deformity of pelvis: Secondary | ICD-10-CM | POA: Diagnosis not present

## 2015-07-29 DIAGNOSIS — M9905 Segmental and somatic dysfunction of pelvic region: Secondary | ICD-10-CM | POA: Diagnosis not present

## 2015-08-05 DIAGNOSIS — M9903 Segmental and somatic dysfunction of lumbar region: Secondary | ICD-10-CM | POA: Diagnosis not present

## 2015-08-05 DIAGNOSIS — M5416 Radiculopathy, lumbar region: Secondary | ICD-10-CM | POA: Diagnosis not present

## 2015-08-05 DIAGNOSIS — M9905 Segmental and somatic dysfunction of pelvic region: Secondary | ICD-10-CM | POA: Diagnosis not present

## 2015-08-05 DIAGNOSIS — M955 Acquired deformity of pelvis: Secondary | ICD-10-CM | POA: Diagnosis not present

## 2015-08-06 ENCOUNTER — Other Ambulatory Visit: Payer: Self-pay | Admitting: Internal Medicine

## 2015-08-19 DIAGNOSIS — M9905 Segmental and somatic dysfunction of pelvic region: Secondary | ICD-10-CM | POA: Diagnosis not present

## 2015-08-19 DIAGNOSIS — M9903 Segmental and somatic dysfunction of lumbar region: Secondary | ICD-10-CM | POA: Diagnosis not present

## 2015-08-19 DIAGNOSIS — M5416 Radiculopathy, lumbar region: Secondary | ICD-10-CM | POA: Diagnosis not present

## 2015-08-19 DIAGNOSIS — M955 Acquired deformity of pelvis: Secondary | ICD-10-CM | POA: Diagnosis not present

## 2015-08-26 DIAGNOSIS — M5416 Radiculopathy, lumbar region: Secondary | ICD-10-CM | POA: Diagnosis not present

## 2015-08-26 DIAGNOSIS — M955 Acquired deformity of pelvis: Secondary | ICD-10-CM | POA: Diagnosis not present

## 2015-08-26 DIAGNOSIS — M9905 Segmental and somatic dysfunction of pelvic region: Secondary | ICD-10-CM | POA: Diagnosis not present

## 2015-08-26 DIAGNOSIS — M9903 Segmental and somatic dysfunction of lumbar region: Secondary | ICD-10-CM | POA: Diagnosis not present

## 2015-08-31 ENCOUNTER — Other Ambulatory Visit: Payer: Self-pay | Admitting: Internal Medicine

## 2015-09-09 DIAGNOSIS — M9903 Segmental and somatic dysfunction of lumbar region: Secondary | ICD-10-CM | POA: Diagnosis not present

## 2015-09-09 DIAGNOSIS — M5416 Radiculopathy, lumbar region: Secondary | ICD-10-CM | POA: Diagnosis not present

## 2015-09-09 DIAGNOSIS — M9905 Segmental and somatic dysfunction of pelvic region: Secondary | ICD-10-CM | POA: Diagnosis not present

## 2015-09-09 DIAGNOSIS — M955 Acquired deformity of pelvis: Secondary | ICD-10-CM | POA: Diagnosis not present

## 2015-09-14 ENCOUNTER — Other Ambulatory Visit: Payer: Self-pay | Admitting: Internal Medicine

## 2015-09-16 DIAGNOSIS — M9903 Segmental and somatic dysfunction of lumbar region: Secondary | ICD-10-CM | POA: Diagnosis not present

## 2015-09-16 DIAGNOSIS — M5416 Radiculopathy, lumbar region: Secondary | ICD-10-CM | POA: Diagnosis not present

## 2015-09-16 DIAGNOSIS — M955 Acquired deformity of pelvis: Secondary | ICD-10-CM | POA: Diagnosis not present

## 2015-09-16 DIAGNOSIS — M9905 Segmental and somatic dysfunction of pelvic region: Secondary | ICD-10-CM | POA: Diagnosis not present

## 2015-09-29 ENCOUNTER — Ambulatory Visit (INDEPENDENT_AMBULATORY_CARE_PROVIDER_SITE_OTHER): Payer: Commercial Managed Care - HMO | Admitting: Internal Medicine

## 2015-09-29 ENCOUNTER — Encounter: Payer: Self-pay | Admitting: Internal Medicine

## 2015-09-29 VITALS — BP 130/80 | HR 66 | Temp 98.1°F | Ht 69.0 in | Wt 178.0 lb

## 2015-09-29 DIAGNOSIS — I10 Essential (primary) hypertension: Secondary | ICD-10-CM

## 2015-09-29 DIAGNOSIS — IMO0002 Reserved for concepts with insufficient information to code with codable children: Secondary | ICD-10-CM

## 2015-09-29 DIAGNOSIS — Q396 Congenital diverticulum of esophagus: Secondary | ICD-10-CM | POA: Diagnosis not present

## 2015-09-29 DIAGNOSIS — E039 Hypothyroidism, unspecified: Secondary | ICD-10-CM | POA: Diagnosis not present

## 2015-09-29 DIAGNOSIS — G20 Parkinson's disease: Secondary | ICD-10-CM | POA: Diagnosis not present

## 2015-09-29 DIAGNOSIS — E538 Deficiency of other specified B group vitamins: Secondary | ICD-10-CM

## 2015-09-29 DIAGNOSIS — Z89422 Acquired absence of other left toe(s): Secondary | ICD-10-CM

## 2015-09-29 DIAGNOSIS — Z0000 Encounter for general adult medical examination without abnormal findings: Secondary | ICD-10-CM | POA: Diagnosis not present

## 2015-09-29 LAB — T4, FREE: FREE T4: 1.14 ng/dL (ref 0.60–1.60)

## 2015-09-29 LAB — COMPREHENSIVE METABOLIC PANEL
ALT: 7 U/L (ref 0–53)
AST: 15 U/L (ref 0–37)
Albumin: 4.3 g/dL (ref 3.5–5.2)
Alkaline Phosphatase: 77 U/L (ref 39–117)
BUN: 20 mg/dL (ref 6–23)
CO2: 31 meq/L (ref 19–32)
Calcium: 9.5 mg/dL (ref 8.4–10.5)
Chloride: 105 mEq/L (ref 96–112)
Creatinine, Ser: 1.56 mg/dL — ABNORMAL HIGH (ref 0.40–1.50)
GFR: 45.45 mL/min — AB (ref >60.00)
GLUCOSE: 95 mg/dL (ref 70–99)
POTASSIUM: 4.2 meq/L (ref 3.5–5.1)
Sodium: 142 mEq/L (ref 135–145)
Total Bilirubin: 0.9 mg/dL (ref 0.2–1.2)
Total Protein: 7.3 g/dL (ref 6.0–8.3)

## 2015-09-29 LAB — CBC WITH DIFFERENTIAL/PLATELET
BASOS PCT: 0.4 % (ref 0.0–3.0)
Basophils Absolute: 0 10*3/uL (ref 0.0–0.1)
EOS PCT: 1.2 % (ref 0.0–5.0)
Eosinophils Absolute: 0.1 10*3/uL (ref 0.0–0.7)
HCT: 45.7 % (ref 39.0–52.0)
Hemoglobin: 15.3 g/dL (ref 13.0–17.0)
LYMPHS ABS: 1.5 10*3/uL (ref 0.7–4.0)
Lymphocytes Relative: 23.9 % (ref 12.0–46.0)
MCHC: 33.6 g/dL (ref 30.0–36.0)
MCV: 90.5 fl (ref 78.0–100.0)
MONOS PCT: 9.8 % (ref 3.0–12.0)
Monocytes Absolute: 0.6 10*3/uL (ref 0.1–1.0)
NEUTROS ABS: 4 10*3/uL (ref 1.4–7.7)
NEUTROS PCT: 64.7 % (ref 43.0–77.0)
PLATELETS: 243 10*3/uL (ref 150.0–400.0)
RBC: 5.05 Mil/uL (ref 4.22–5.81)
RDW: 13.9 % (ref 11.5–15.5)
WBC: 6.2 10*3/uL (ref 4.0–10.5)

## 2015-09-29 LAB — TSH: TSH: 0.41 u[IU]/mL (ref 0.35–4.50)

## 2015-09-29 NOTE — Assessment & Plan Note (Addendum)
I have personally reviewed the Medicare Annual Wellness questionnaire and have noted 1. The patient's medical and social history 2. Their use of alcohol, tobacco or illicit drugs 3. Their current medications and supplements 4. The patient's functional ability including ADL's, fall risks, home safety risks and hearing or visual             impairment. 5. Diet and physical activities 6. Evidence for depression or mood disorders  The patients weight, height, BMI and visual acuity have been recorded in the chart I have made referrals, counseling and provided education to the patient based review of the above and I have provided the pt with a written personalized care plan for preventive services.  I have provided you with a copy of your personalized plan for preventive services. Please take the time to review along with your updated medication list.  UTD on imms No cancer screening due to age Discussed restarting his exercise Audiology evaluation

## 2015-09-29 NOTE — Progress Notes (Signed)
Pre visit review using our clinic review tool, if applicable. No additional management support is needed unless otherwise documented below in the visit note. 

## 2015-09-29 NOTE — Assessment & Plan Note (Signed)
Back on his supplement

## 2015-09-29 NOTE — Progress Notes (Signed)
Subjective:    Patient ID: Walter [name], male    DOB: 1931-07-21, 82 y.o.   MRN: [id number]  HPI Here for Medicare wellness and follow up of chronic health issues Reviewed form and advanced directives Reviewed other doctors No tobacco now Occasional wine--mostly 1 per night Had been exercising---has stopped due to back problems. Plans to restart Fell once---scraped left arm (before the Parkinson's diagnosis and B12 supplements). Balance is much better now No depression or anhedonia Independent with instrumental ADLs Mild memory changes--nothing striking  He has concerns about his hearing Wife has noticed some changes--he hasn't Right ear tests poorly now  He has done very well with the sinemet Noted improvement after the B12 started as well--he is still on this Wonders about seeing a neurologist Has been going to the seminars at [facility] is interested in more info  Did throw his back out a couple of months ago Finally better with help from Dr Francisca December Gave up [place] due to the back problems  No problems with BP med No headaches No chest pain No SOB No palpitations No edema  Voids okay Stable nocturia x 3  No sig daytime symptoms  Right 2nd toe amputation site is fine He does feel this may affect his balance some  Current Outpatient Prescriptions on File Prior to Visit  Medication Sig Dispense Refill  . amLODipine (NORVASC) 5 MG tablet TAKE 1 TABLET EVERY DAY 90 tablet 0  . carbidopa-levodopa (SINEMET IR) 25-100 MG tablet TAKE 1 TABLET THREE TIMES DAILY 270 tablet 0  . Cholecalciferol (VITAMIN D) 2000 UNITS CAPS Take by mouth daily.    Marland Kitchen levothyroxine (SYNTHROID, LEVOTHROID) 112 MCG tablet TAKE 1 TABLET EVERY DAY BEFORE BREAKFAST 90 tablet 0  . vitamin B-12 (CYANOCOBALAMIN) 1000 MCG tablet Take 1,000 mcg by mouth daily.     No current facility-administered medications on file prior to visit.     Allergies  Allergen Reactions  . Quinolones     tendonitis      Past Medical History:  Diagnosis Date  . ARF (acute renal failure) ([place]) 2011   after TKR  . BPH (benign prostatic hypertrophy)   . DVT (deep venous thrombosis) ([place]) 1998   after left knee arthroscopy  . Hypertension   . Hypothyroid 2007   postop from thyroidectomy (cancer scare)  . Kidney stones    recurrent  . Oral cancer ([place]) 2004   graft from left thigh  . Osteoarthritis of both knees   . Tendonitis 2012   from quinolone  . Toe amputation status ([place]) 2010    Past Surgical History:  Procedure Laterality Date  . APPENDECTOMY    . CHOLECYSTECTOMY    . LITHOTRIPSY  2009/2010   then stent and cystoscopic removal 2014  . Oral cancer removed Right 2004  . REPAIR ZENKER'S DIVERTICULA  2013  . THYROIDECTOMY  2007  . TOE AMPUTATION Right 2010   badly overlapping other toe  . TOTAL KNEE ARTHROPLASTY Left   . TRANSURETHRAL RESECTION OF PROSTATE  2011  . UMBILICAL HERNIA REPAIR  2007    Family History  Problem Relation Age of Onset  . Stroke Father   . Heart disease Neg Hx   . Diabetes Neg Hx     Social History   Social History  . Marital status: Married    Spouse name: N/A  . Number of children: 3  . Years of education: N/A   Occupational History  . 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 Smoking status: Former Smoker    Types: Cigarettes    Quit date: 04/27/2002  . Smokeless tobacco: Never Used  . Alcohol use 0.0 oz/week     Comment: occ  . Drug use: No  . Sexual activity: Not on file   Other Topics Concern  . Not on file   Social History Narrative   Son works for [employer]   1 daughter in [place], other in [place] living will   Wife, then son, would be health care POA   He isn't sure about DNR--will accept resuscitation for now   No tube feeds if cognitively unaware   Review of Systems Appetite is okay Weight has drifted down some. Still some trouble with  swallowing due to the esophageal diverticulum Now with partial denture--keeps up with dentist Wears seat belt No significant joint swelling or pain Has spot on nose and right cheek--no real change. Hasn't seen dermatologist     Objective:   Physical Exam  Constitutional: He is oriented to person, place, and time. He appears well-developed and well-nourished. No distress.  HENT:  Mouth/Throat: Oropharynx is clear and moist. No oropharyngeal exudate.  Neck: Normal range of motion. Neck supple. No thyromegaly present.  Cardiovascular: Normal rate, regular rhythm and normal heart sounds.  Exam reveals no gallop.   No murmur heard. Faint pedal pulses  Pulmonary/Chest: Effort normal and breath sounds normal. No respiratory distress. He has no wheezes. He has no rales.  Abdominal: Soft. There is no tenderness.  Musculoskeletal: He exhibits no edema or tenderness.  Absent right 2nd toe  Lymphadenopathy:    He has no cervical adenopathy.  Neurological: He is alert and oriented to person, place, and time.  President-- "Dwaine Deter, Bush" [phone] [id number] Recall 3/3  Skin: No rash noted. No erythema.          Assessment & Plan:

## 2015-09-29 NOTE — Assessment & Plan Note (Signed)
BP Readings from Last 3 Encounters:  09/29/15 130/80  05/06/15 124/86  03/10/15 128/70   Well controlled

## 2015-09-29 NOTE — Assessment & Plan Note (Signed)
Doing well on the sinemet Ready to see neurologist--will set up with Dr Tat

## 2015-09-29 NOTE — Assessment & Plan Note (Signed)
Seems euthyroid

## 2015-09-29 NOTE — Assessment & Plan Note (Signed)
Mild affect on balance

## 2015-09-29 NOTE — Assessment & Plan Note (Signed)
Affects his swallowing a little

## 2015-09-29 NOTE — Patient Instructions (Signed)
Please set up an audiology evaluation at [facility]. I recommend loratadine 10mg  1-2 tabs daily as needed for allergy symptoms

## 2015-09-30 DIAGNOSIS — M9903 Segmental and somatic dysfunction of lumbar region: Secondary | ICD-10-CM | POA: Diagnosis not present

## 2015-09-30 DIAGNOSIS — M955 Acquired deformity of pelvis: Secondary | ICD-10-CM | POA: Diagnosis not present

## 2015-09-30 DIAGNOSIS — M9905 Segmental and somatic dysfunction of pelvic region: Secondary | ICD-10-CM | POA: Diagnosis not present

## 2015-09-30 DIAGNOSIS — M5416 Radiculopathy, lumbar region: Secondary | ICD-10-CM | POA: Diagnosis not present

## 2015-10-13 ENCOUNTER — Other Ambulatory Visit: Payer: Self-pay | Admitting: Internal Medicine

## 2015-11-03 ENCOUNTER — Ambulatory Visit: Payer: Commercial Managed Care - HMO | Admitting: Neurology

## 2015-11-10 ENCOUNTER — Ambulatory Visit: Payer: Commercial Managed Care - HMO | Admitting: Neurology

## 2015-11-11 DIAGNOSIS — M9903 Segmental and somatic dysfunction of lumbar region: Secondary | ICD-10-CM | POA: Diagnosis not present

## 2015-11-11 DIAGNOSIS — M9905 Segmental and somatic dysfunction of pelvic region: Secondary | ICD-10-CM | POA: Diagnosis not present

## 2015-11-11 DIAGNOSIS — M955 Acquired deformity of pelvis: Secondary | ICD-10-CM | POA: Diagnosis not present

## 2015-11-11 DIAGNOSIS — M5416 Radiculopathy, lumbar region: Secondary | ICD-10-CM | POA: Diagnosis not present

## 2015-11-19 NOTE — Progress Notes (Addendum)
Walter [name] was seen today in the movement disorders clinic for neurologic consultation at the request of Viviana Simpler, MD.  The consultation is for the evaluation of Parkinson's disease.  The patient reports he has never seen a neurologist previously.  This patient is accompanied in the office by his spouse who supplements the history.  I have had the opportunity to review records from his primary care physician regarding the diagnosis.  The patient was diagnosed with Parkinson's disease by his primary care physician in June, 2016 and placed on levodopa.  The patient indicates that after starting the medication, he felt that his symptoms were much better.  His balance was better.  Reports that Barnum helped his balance as well.  Pt reports that his first sx was slowness, shuffling and balance trouble.    Specific Symptoms:  Tremor: No. Family hx of similar:  No. Voice: gotten weaker Sleep: multiple times awakening to use RR but able to get back to sleep  Vivid Dreams:  No.  Acting out dreams:  Yes.   (fell out of bed acting out a dream about a year ago but none since) Wet Pillows: Yes.  , occasionally Postural symptoms:  No.  Falls?  No., last fall over a year ago while leaf blowing.   Bradykinesia symptoms: shuffling gait, drooling while awake and difficulty regaining balance; has trouble with balance when he golfs (has never done parkinsons balance PT) Loss of smell:  Yes.   (compared to his wife) Loss of taste:  No. Urinary Incontinence:  No. Difficulty Swallowing:  Yes.   (pills, meats - had a zenkers diverticulum few years ago and much of this is related to that) Handwriting, micrographia: No., but it has gotten worse Trouble with ADL's:  Yes.   (slower than used to be)  Trouble buttoning clothing: Yes.   Depression:  No. but some anxiety with driving Memory changes:  "not bad"  Hallucinations:  No. (not since knee surgery and was on pain medication)  visual distortions:  No. N/V:  No. Lightheaded:  No.  Syncope: No. Diplopia:  No. Dyskinesia:  No.  Neuroimaging has not previously been performed.    PREVIOUS MEDICATIONS: Sinemet  ALLERGIES:   Allergies  Allergen Reactions  . Quinolones     tendonitis    CURRENT MEDICATIONS:  Outpatient Encounter Prescriptions as of 11/23/2015  Medication Sig  . amLODipine (NORVASC) 5 MG tablet TAKE 1 TABLET EVERY DAY  . carbidopa-levodopa (SINEMET IR) 25-100 MG tablet TAKE 1 TABLET THREE TIMES DAILY  . Cholecalciferol (VITAMIN D) 2000 UNITS CAPS Take by mouth daily.  Marland Kitchen docusate sodium (COLACE) 100 MG capsule Take 100 mg by mouth 2 (two) times daily.  Marland Kitchen levothyroxine (SYNTHROID, LEVOTHROID) 112 MCG tablet TAKE 1 TABLET EVERY DAY BEFORE BREAKFAST  . polyethylene glycol (MIRALAX / GLYCOLAX) packet Take 17 g by mouth daily.  . vitamin B-12 (CYANOCOBALAMIN) 1000 MCG tablet Take 1,000 mcg by mouth daily.   No facility-administered encounter medications on file as of 11/23/2015.     PAST MEDICAL HISTORY:   Past Medical History:  Diagnosis Date  . ARF (acute renal failure) ([place]) 2011   after TKR  . BPH (benign prostatic hypertrophy)   . DVT (deep venous thrombosis) ([place]) 1998   after left knee arthroscopy  . Hypertension   . Hypothyroid 2007   postop from thyroidectomy (cancer scare)  . Kidney stones    recurrent  . Oral cancer [facility]) 2004   graft from left  thigh  . Osteoarthritis of both knees   . Parkinson's disease ([place])   . Tendonitis 2012   from quinolone  . Toe amputation status ([place]) 2010    PAST SURGICAL HISTORY:   Past Surgical History:  Procedure Laterality Date  . APPENDECTOMY    . CHOLECYSTECTOMY    . LITHOTRIPSY  2009/2010   then stent and cystoscopic removal 2014  . Oral cancer removed Right 2004  . REPAIR ZENKER'S DIVERTICULA  2013  . THYROIDECTOMY  2007  . TOE AMPUTATION Right 2010   badly overlapping other toe  . TOTAL KNEE ARTHROPLASTY Left   . TRANSURETHRAL RESECTION OF  PROSTATE  2011  . UMBILICAL HERNIA REPAIR  2007    SOCIAL HISTORY:   Social History   Social History  . Marital status: Married    Spouse name: N/A  . Number of children: 3  . Years of education: N/A   Occupational History  . 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 Smoking status: Former Smoker    Types: Cigarettes    Quit date: 04/27/2002  . Smokeless tobacco: Never Used  . Alcohol use 0.0 oz/week     Comment: one drink daily (wine or scotch)  . Drug use: No  . Sexual activity: Not on file   Other Topics Concern  . Not on file   Social History Narrative   Son works for [employer]   1 daughter in [place], other in [place] living will   Wife, then son, would be health care POA   He isn't sure about DNR--will accept resuscitation for now   No tube feeds if cognitively unaware    FAMILY HISTORY:   Family Status  Relation Status  . Mother Deceased at age 85   old age  . Father Deceased at age 32   stroke  . Brother Deceased   unknown-- had moved away  . Child Alive  . Neg Hx     ROS:  A complete 10 system review of systems was obtained and was unremarkable apart from what is mentioned above.  PHYSICAL EXAMINATION:    VITALS:   Vitals:   11/23/15 1230  BP: (!) 146/78  Pulse: 67  Weight: 181 lb (82.1 kg)  Height: _0  (1.778 m)    GEN:  The patient appears stated age and is in NAD. HEENT:  Normocephalic, atraumatic.  The mucous membranes are moist. The superficial temporal arteries are without ropiness or tenderness. CV:  RRR Lungs:  CTAB Neck/HEME:  There are no carotid bruits bilaterally.  Neurological examination:  Orientation: The patient is alert and oriented x3. Fund of knowledge is appropriate.  Recent and remote memory are intact.  Attention and concentration are normal.    Able to name objects and repeat phrases. Cranial nerves: There is good facial symmetry with  facial hypomimia. Pupils are equal round and reactive to light bilaterally. Fundoscopic exam reveals clear margins bilaterally. Extraocular muscles are intact. The visual fields are full to confrontational testing. The speech is fluent and clear. Soft palate rises symmetrically and there is no tongue deviation. Hearing is intact to conversational tone. Sensation: Sensation is intact to light and pinprick throughout (facial, trunk, extremities). Vibration is decreased at the bilateral big toe and ankle. There is no extinction with double simultaneous stimulation. There is no sensory dermatomal level identified. Motor: Strength is 5/5 in the bilateral upper  and lower extremities.   Shoulder shrug is equal and symmetric.  There is no pronator drift. Deep tendon reflexes: Deep tendon reflexes are 2+/4 at the bilateral biceps, triceps, brachioradialis, patella and achilles. Plantar responses are downgoing bilaterally.  Movement examination: Tone: There is mild increased tone in the UE, mostly with activation procedures Abnormal movements: There is only mild chin tremor and no tremor elsewhere even with distraction procedures Coordination:  There is minimal decremation with RAM's, in the right hand with hand opening/closing.  All other RAMs were normal bilaterally Gait and Station: The patient has minimal difficulty arising out of a deep-seated chair without the use of the hands and is able to do it on first attempt. The patient's stride length is decreased with decreased arm swing bilaterally.  The patient has a positive pull test.      ASSESSMENT/PLAN:  1.  Parkinsonism.  I suspect that this does represent idiopathic Parkinson's disease but vascular parkinsonism is in the Ddx.  The patient has bradykinesia, rigidity and mild postural instability.  -We discussed the diagnosis as well as pathophysiology of the disease.  We discussed treatment options as well as prognostic indicators.  Patient education was  provided.  -Greater than 50% of the 60 minute visit was spent in counseling answering questions and talking about what to expect now as well as in the future.  We talked about medication options as well as potential future surgical options.  We talked about safety in the home.  -Continue carbidopa/levodopa 25/100 tid but move dosages closer together so that he takes them 30 min prior to meals.  Risks, benefits, side effects and alternative therapies were discussed.  The opportunity to ask questions was given and they were answered to the best of my ability.  The patient expressed understanding and willingness to follow the outlined treatment protocols.  -I will refer the patient to the Parkinson's program at the neurorehabilitation Center for PT at [facility].  -We discussed community resources in the area including patient support groups and community exercise programs for PD and pt education was provided to the patient.  He lives at [facility] and is attending support group there.  Talked about the [facility] boxing program that they may be getting  -we will do an MRI brain at [facility]  2.  Dysphagia  -he has a zenkers diverticulum.  This has greatly affected swallowing and I would suggest a MBE to make sure that nothing else is going on.  3.  Follow up is anticipated in the next few months, sooner should new neurologic issues arise.  Much greater than 50% of this visit was spent in counseling and coordinating care.  Total face to face time:  60 min   Cc:  Viviana Simpler, MD

## 2015-11-23 ENCOUNTER — Ambulatory Visit (INDEPENDENT_AMBULATORY_CARE_PROVIDER_SITE_OTHER): Payer: Commercial Managed Care - HMO | Admitting: Neurology

## 2015-11-23 ENCOUNTER — Encounter: Payer: Self-pay | Admitting: Neurology

## 2015-11-23 VITALS — BP 146/78 | HR 67 | Ht 70.0 in | Wt 181.0 lb

## 2015-11-23 DIAGNOSIS — G20 Parkinson's disease: Secondary | ICD-10-CM

## 2015-11-23 DIAGNOSIS — F458 Other somatoform disorders: Secondary | ICD-10-CM

## 2015-11-23 DIAGNOSIS — R1319 Other dysphagia: Secondary | ICD-10-CM

## 2015-11-23 NOTE — Patient Instructions (Addendum)
1. You have been referred to Physical Therapy and a modified barium swallow at outpatient rehab at [facility]. They will call you directly to schedule an appointment.  Please call [phone]  if you do not hear from them.   2. We have scheduled you at [facility] for your MRI on 12/03/2015 at 9:00. Please arrive 15 minutes prior and go to radiology. If you need to reschedule for any reason please call [phone].

## 2015-11-23 NOTE — Addendum Note (Signed)
Addended byAnnamaria Helling on: 11/23/2015 01:56 PM   Modules accepted: Orders

## 2015-11-24 ENCOUNTER — Other Ambulatory Visit: Payer: Self-pay | Admitting: Neurology

## 2015-11-24 DIAGNOSIS — R1310 Dysphagia, unspecified: Secondary | ICD-10-CM

## 2015-12-03 ENCOUNTER — Ambulatory Visit
Admission: RE | Admit: 2015-12-03 | Discharge: 2015-12-03 | Disposition: A | Discharge status: Home / Self Care | Payer: Commercial Managed Care - HMO | Source: Ambulatory Visit | Attending: Neurology | Admitting: Neurology

## 2015-12-03 ENCOUNTER — Telehealth: Payer: Self-pay | Admitting: Neurology

## 2015-12-03 DIAGNOSIS — G319 Degenerative disease of nervous system, unspecified: Secondary | ICD-10-CM | POA: Insufficient documentation

## 2015-12-03 DIAGNOSIS — G20 Parkinson's disease: Secondary | ICD-10-CM

## 2015-12-03 DIAGNOSIS — R2689 Other abnormalities of gait and mobility: Secondary | ICD-10-CM | POA: Diagnosis not present

## 2015-12-03 DIAGNOSIS — R9082 White matter disease, unspecified: Secondary | ICD-10-CM | POA: Insufficient documentation

## 2015-12-03 NOTE — Telephone Encounter (Signed)
Patient made aware of results.  

## 2015-12-03 NOTE — Telephone Encounter (Signed)
-----   Message from Alda Berthold, DO sent at 12/03/2015  1:01 PM EDT ----- Please inform patient that his MRI brain shows age-related changes.  No tumor, stroke, or bleed. Thanks.

## 2015-12-11 ENCOUNTER — Ambulatory Visit
Admission: RE | Admit: 2015-12-11 | Discharge: 2015-12-11 | Disposition: A | Discharge status: Home / Self Care | Payer: Commercial Managed Care - HMO | Source: Ambulatory Visit | Attending: Neurology | Admitting: Neurology

## 2015-12-11 DIAGNOSIS — G20 Parkinson's disease: Secondary | ICD-10-CM | POA: Insufficient documentation

## 2015-12-11 DIAGNOSIS — R1312 Dysphagia, oropharyngeal phase: Secondary | ICD-10-CM | POA: Insufficient documentation

## 2015-12-11 DIAGNOSIS — R1310 Dysphagia, unspecified: Secondary | ICD-10-CM | POA: Diagnosis not present

## 2015-12-11 NOTE — Therapy (Signed)
[place], [place], [address] Phone: [phone]   Fax:     Modified Barium Swallow  Patient Details  Name: Walter [name] MRN: [id number] Date of Birth: 1931/08/06 No Data Recorded  Encounter Date: 12/11/2015      End of Session - 12/11/15 1600    Visit Number 1   Number of Visits 1   Date for SLP Re-Evaluation 12/11/15   SLP Start Time 59   SLP Stop Time  1400   SLP Time Calculation (min) 60 min   Activity Tolerance Patient tolerated treatment well      Past Medical History:  Diagnosis Date  . ARF (acute renal failure) ([place]) 2011   after TKR  . BPH (benign prostatic hypertrophy)   . DVT (deep venous thrombosis) ([place]) 1998   after left knee arthroscopy  . Hypertension   . Hypothyroid 2007   postop from thyroidectomy (cancer scare)  . Kidney stones    recurrent  . Oral cancer ([place]) 2004   graft from left thigh  . Osteoarthritis of both knees   . Parkinson's disease ([place])   . Tendonitis 2012   from quinolone  . Toe amputation status ([place]) 2010    Past Surgical History:  Procedure Laterality Date  . APPENDECTOMY    . CHOLECYSTECTOMY    . LITHOTRIPSY  2009/2010   then stent and cystoscopic removal 2014  . Oral cancer removed Right 2004  . REPAIR ZENKER'S DIVERTICULA  2013  . THYROIDECTOMY  2007  . TOE AMPUTATION Right 2010   badly overlapping other toe  . TOTAL KNEE ARTHROPLASTY Left   . TRANSURETHRAL RESECTION OF PROSTATE  2011  . UMBILICAL HERNIA REPAIR  2007    There were no vitals filed for this visit.   Subjective: Patient behavior: (alertness, ability to follow instructions, etc.): Patient is able to report his medical/swallowing hsitory and follow directions for this evaluation.  The patient reports that his voice is not as strong as it once was.  Chief complaint: concern for oropharyngeal dysphagia in the context of new diagnosis of Parkinson's  disease.   Objective:  Radiological Procedure: A videoflouroscopic evaluation of oral-preparatory, reflex initiation, and pharyngeal phases of the swallow was performed; as well as a screening of the upper esophageal phase.  I. POSTURE: Upright in MBS chair  II. VIEW: Lateral  III. COMPENSATORY STRATEGIES: Multiple swallows effectively clear pharyngeal residue; increased effort improves hyolaryngeal movement  IV. BOLUSES ADMINISTERED:   Thin Liquid: 2 small, 3 rapid consecutive   Nectar-thick Liquid: 2 moderate boluses    Puree: 2 teaspoon presentations    Mechanical Soft: 1/4 graham cracker in applesauce  V. RESULTS OF EVALUATION: A. ORAL PREPARATORY PHASE: (The lips, tongue, and velum are observed for strength and coordination)       **Overall Severity Rating: Mild; anterior posterior rolling pattern in lingual propulsion of the bolus  B. SWALLOW INITIATION/REFLEX: (The reflex is normal if "triggered" by the time the bolus reached the base of the tongue)  **Overall Severity Rating: Moderate; triggers while falling from the valleculae to the pyriform sinuses  C. PHARYNGEAL PHASE: (Pharyngeal function is normal if the bolus shows rapid, smooth, and continuous transit through the pharynx and there is no pharyngeal residue after the swallow)  **Overall Severity Rating: Moderate; decreased hyolaryngeal movement, decreased tongue base retraction, and incomplete epiglottic inversion.  There is moderate vallecular and pyriform sinus residue across consistencies.  The patient does spontaneously swallow, which  effectively clears the residue within 2-3 swallows.    D. LARYNGEAL PENETRATION: (Material entering into the laryngeal inlet/vestibule but not aspirated) There is laryngeal penetration of nectar-thick liquid with trace laryngeal vestibule residue.  There is flash/transient laryngeal penetration with thins and no residue.    E. ASPIRATION:  None  F. ESOPHAGEAL PHASE: (Screening of the  upper esophagus) Zenker's diverticulum  ASSESSMENT: 82 year old man, with recent diagnosis of Parkinson's and history of oral cancer (2004) and Zenker's repair (2013), is presenting with moderate oropharyngeal dysphagia characterized by anterior posterior rolling pattern in lingual propulsion of the bolus, delayed pharyngeal swallow initiation, decreased hyolaryngeal movement, decreased tongue base retraction, and incomplete epiglottic inversion.  There is moderate vallecular and pyriform sinus residue across consistencies.  The patient does spontaneously swallow, which effectively clears the residue within 2-3 swallows.  There is laryngeal penetration of nectar-thick liquid with trace laryngeal vestibule residue.  There is flash/transient laryngeal penetration with thins and no residue.  The patient is observed to use greater effort with swallowing thin liquid and have greater hyolaryngeal movement.  There was no observed laryngeal aspiration.  The Zenker's diverticulum was observed to fill with contrast material but did not empty into the pharynx.  The patient is taking small bites/sips, moistening foods, chewing well, swallowing multiple times, and avoiding problematic foods.  In view of improved swallowing with increased effort, the patient would benefit from high effort/high intensity vocal exercises (such as the LSVT-LOUD program).  LSVT has been shown to improve neuromuscular control of the entire upper aerodigestive tract, improving oral tongue and tongue base function during the oral and pharyngeal phases of swallowing as well as improving vocal intensity.  The patient has expressed interest in participating in both LSVT-LOUD and LSVT-BIG.  PLAN/RECOMMENDATIONS:   A. Diet: Regular (moisten and soften as needed)   B. Swallowing Precautions: small bites/sips, moistening foods, chewing well, swallowing multiple times, and avoiding problematic foods.     C. Recommended consultation to: follow up with  physicians as scheduled   D. Therapy recommendations: LSVT-LOUD   E. Results and recommendations were discussed with the patient and his wife immediately following the study and the final report routed to the referring MD.   Patient will benefit from skilled therapeutic intervention in order to improve the following deficits and impairments:   Oropharyngeal dysphagia - Plan: DG OP Swallowing Func-Medicare/Speech Path, DG OP Swallowing Func-Medicare/Speech Path      G-Codes - 09-Jan-2016 1601    Functional Assessment Tool Used MBS, clinical judgment   Functional Limitations Swallowing   Swallow Current Status [id number]) At least 20 percent but less than 40 percent impaired, limited or restricted   Swallow Goal Status [id number]) At least 20 percent but less than 40 percent impaired, limited or restricted   Swallow Discharge Status [phone]) At least 20 percent but less than 40 percent impaired, limited or restricted          Problem List Patient Active Problem List   Diagnosis Date Noted  . Vitamin B12 deficiency 09/09/2014  . Preventative health care 08/04/2014  . Parkinson's disease ([place]) 08/04/2014  . Esophageal diverticulum 01/01/2014  . Actinic keratosis 07/31/2013  . Hypertension   . Osteoarthritis of both knees   . Toe amputation status ([place])   . BPH (benign prostatic hypertrophy)   . Kidney stones   . Hypothyroid   . Tendonitis    [name], MS/CCC- SLP  Lou Miner 01/09/2016, 4:02 PM  [facility] [place], [place], [address] Phone: [phone]   Fax:     Name: Walter [name] MRN: [id number] Date of Birth: 1931-12-06

## 2015-12-18 ENCOUNTER — Ambulatory Visit (INDEPENDENT_AMBULATORY_CARE_PROVIDER_SITE_OTHER): Payer: Commercial Managed Care - HMO

## 2015-12-18 ENCOUNTER — Ambulatory Visit: Payer: Commercial Managed Care - HMO

## 2015-12-18 DIAGNOSIS — Z23 Encounter for immunization: Secondary | ICD-10-CM | POA: Diagnosis not present

## 2015-12-22 ENCOUNTER — Ambulatory Visit: Payer: Commercial Managed Care - HMO

## 2015-12-30 DIAGNOSIS — M9905 Segmental and somatic dysfunction of pelvic region: Secondary | ICD-10-CM | POA: Diagnosis not present

## 2015-12-30 DIAGNOSIS — M9903 Segmental and somatic dysfunction of lumbar region: Secondary | ICD-10-CM | POA: Diagnosis not present

## 2015-12-30 DIAGNOSIS — M5416 Radiculopathy, lumbar region: Secondary | ICD-10-CM | POA: Diagnosis not present

## 2015-12-30 DIAGNOSIS — M955 Acquired deformity of pelvis: Secondary | ICD-10-CM | POA: Diagnosis not present

## 2016-01-04 ENCOUNTER — Encounter: Payer: Self-pay | Admitting: Primary Care

## 2016-01-04 ENCOUNTER — Ambulatory Visit (INDEPENDENT_AMBULATORY_CARE_PROVIDER_SITE_OTHER): Payer: Commercial Managed Care - HMO | Admitting: Primary Care

## 2016-01-04 VITALS — BP 140/82 | HR 77 | Temp 98.6°F | Ht 69.0 in | Wt 182.4 lb

## 2016-01-04 DIAGNOSIS — R197 Diarrhea, unspecified: Secondary | ICD-10-CM | POA: Diagnosis not present

## 2016-01-04 LAB — COMPREHENSIVE METABOLIC PANEL
ALK PHOS: 74 U/L (ref 39–117)
ALT: 4 U/L (ref 0–53)
AST: 12 U/L (ref 0–37)
Albumin: 3.9 g/dL (ref 3.5–5.2)
BUN: 21 mg/dL (ref 6–23)
CHLORIDE: 106 meq/L (ref 96–112)
CO2: 29 meq/L (ref 19–32)
Calcium: 9.1 mg/dL (ref 8.4–10.5)
Creatinine, Ser: 1.69 mg/dL — ABNORMAL HIGH (ref 0.40–1.50)
GFR: 41.41 mL/min — AB (ref >60.00)
GLUCOSE: 96 mg/dL (ref 70–99)
POTASSIUM: 3.7 meq/L (ref 3.5–5.1)
SODIUM: 142 meq/L (ref 135–145)
TOTAL PROTEIN: 6.6 g/dL (ref 6.0–8.3)
Total Bilirubin: 1.2 mg/dL (ref 0.2–1.2)

## 2016-01-04 LAB — CBC WITH DIFFERENTIAL/PLATELET
Basophils Absolute: 0 10*3/uL (ref 0.0–0.1)
Basophils Relative: 0.6 % (ref 0.0–3.0)
EOS PCT: 2.5 % (ref 0.0–5.0)
Eosinophils Absolute: 0.1 10*3/uL (ref 0.0–0.7)
HCT: 42.9 % (ref 39.0–52.0)
Hemoglobin: 14.6 g/dL (ref 13.0–17.0)
LYMPHS ABS: 0.8 10*3/uL (ref 0.7–4.0)
Lymphocytes Relative: 18.2 % (ref 12.0–46.0)
MCHC: 34.1 g/dL (ref 30.0–36.0)
MCV: 90.1 fl (ref 78.0–100.0)
MONO ABS: 0.9 10*3/uL (ref 0.1–1.0)
Monocytes Relative: 20.4 % — ABNORMAL HIGH (ref 3.0–12.0)
NEUTROS ABS: 2.7 10*3/uL (ref 1.4–7.7)
NEUTROS PCT: 58.3 % (ref 43.0–77.0)
PLATELETS: 226 10*3/uL (ref 150.0–400.0)
RBC: 4.76 Mil/uL (ref 4.22–5.81)
RDW: 13.6 % (ref 11.5–15.5)
WBC: 4.6 10*3/uL (ref 4.0–10.5)

## 2016-01-04 NOTE — Progress Notes (Signed)
Subjective:    Patient ID: Walter [name], male    DOB: 05/19/1931, 82 y.o.   MRN: [id number]  HPI  Walter [name] is an 82 year old male with a history of Parkinson's disease, hypothyroidism, essential hypertension who presents today with a chief complaint of diarrhea. He also reports fatigue and decreased appetite. His diarrhea has been present daily (2-3 episodes daily) for the past 2 weeks. He is taking in water and food despite his decrease in appetite. He was using occasional Miralax and Colace for constipation secondary to carbidopa-levodopa and has not taken Miralax or Cloace in over 2 weeks. He denies abdominal pain, nausea, rectal bleeding, recent antibiotic use. Overall he's about the same as when symptoms began 2 weeks ago. He's taken an anti-diarrheal medication infrequently several times over the past 2 weeks with temporary improvement.   Review of Systems  Constitutional: Positive for fatigue. Negative for fever.  Respiratory: Negative for shortness of breath.   Cardiovascular: Negative for chest pain.  Gastrointestinal: Positive for diarrhea. Negative for abdominal pain, blood in stool, nausea and vomiting.  Neurological: Positive for weakness. Negative for dizziness.       Past Medical History:  Diagnosis Date  . ARF (acute renal failure) ([place]) 2011   after TKR  . BPH (benign prostatic hypertrophy)   . DVT (deep venous thrombosis) ([place]) 1998   after left knee arthroscopy  . Hypertension   . Hypothyroid 2007   postop from thyroidectomy (cancer scare)  . Kidney stones    recurrent  . Oral cancer ([place]) 2004   graft from left thigh  . Osteoarthritis of both knees   . Parkinson's disease ([place])   . Tendonitis 2012   from quinolone  . Toe amputation status ([place]) 2010     Social History   Social History  . Marital status: Married    Spouse name: N/A  . Number of children: 3  . Years of education: N/A   Occupational History  . 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 Smoking status: Former Smoker    Types: Cigarettes    Quit date: 04/27/2002  . Smokeless tobacco: Never Used  . Alcohol use 0.0 oz/week     Comment: one drink daily (wine or scotch)  . Drug use: No  . Sexual activity: Not on file   Other Topics Concern  . Not on file   Social History Narrative   Son works for [employer]   1 daughter in [place], other in [place]      Has living will   Wife, then son, would be health care POA   He isn't sure about DNR--will accept resuscitation for now   No tube feeds if cognitively unaware    Past Surgical History:  Procedure Laterality Date  . APPENDECTOMY    . CHOLECYSTECTOMY    . LITHOTRIPSY  2009/2010   then stent and cystoscopic removal 2014  . Oral cancer removed Right 2004  . REPAIR ZENKER'S DIVERTICULA  2013  . THYROIDECTOMY  2007  . TOE AMPUTATION Right 2010   badly overlapping other toe  . TOTAL KNEE ARTHROPLASTY Left   . TRANSURETHRAL RESECTION OF PROSTATE  2011  . UMBILICAL HERNIA REPAIR  2007    Family History  Problem Relation Age of Onset  . Dementia Mother   . Stroke Father   . Pulmonary disease Brother   . Heart disease Neg Hx   .  Diabetes Neg Hx     Allergies  Allergen Reactions  . Quinolones     tendonitis    Current Outpatient Prescriptions on File Prior to Visit  Medication Sig Dispense Refill  . amLODipine (NORVASC) 5 MG tablet TAKE 1 TABLET EVERY DAY 90 tablet 3  . carbidopa-levodopa (SINEMET IR) 25-100 MG tablet TAKE 1 TABLET THREE TIMES DAILY 270 tablet 0  . Cholecalciferol (VITAMIN D) 2000 UNITS CAPS Take by mouth daily.    Marland Kitchen docusate sodium (COLACE) 100 MG capsule Take 100 mg by mouth 2 (two) times daily as needed.     Marland Kitchen levothyroxine (SYNTHROID, LEVOTHROID) 112 MCG tablet TAKE 1 TABLET EVERY DAY BEFORE BREAKFAST 90 tablet 0  . polyethylene glycol (MIRALAX / GLYCOLAX) packet Take 17 g by mouth daily as needed.     . vitamin B-12  (CYANOCOBALAMIN) 1000 MCG tablet Take 1,000 mcg by mouth daily.     No current facility-administered medications on file prior to visit.     BP 140/82   Pulse 77   Temp 98.6 F (37 C) (Oral)   Ht 5\' 9"  (1.753 m)   Wt 182 lb 6.4 oz (82.7 kg)   SpO2 98%   BMI 26.94 kg/m    Objective:   Physical Exam  Constitutional: He appears well-nourished. He does not appear ill.  Cardiovascular: Normal rate and regular rhythm.   Pulmonary/Chest: Effort normal and breath sounds normal.  Abdominal: Soft. Bowel sounds are normal. There is no tenderness.  Skin: Skin is warm and dry.          Assessment & Plan:  Diarrhea:  Present x 2 weeks, 2-3 episodes daily. Also with fatigue. No alarm signs or other symptoms. He appears well hydrated, vitals stable and do not indicate acute dehydration. He is stable for outpatient treatment at this point. Will check CBC, CMP, stool cultures today. Will also have him start Imodium on a regular schedule for the next several days depending on lab results. Stressed importance of proper hydration. Information regarding BRAT diet provided. Strict return precautions provided. Will await labs.  Sheral Flow, NP

## 2016-01-04 NOTE — Progress Notes (Signed)
Pre visit review using our clinic review tool, if applicable. No additional management support is needed unless otherwise documented below in the visit note. 

## 2016-01-04 NOTE — Patient Instructions (Signed)
Complete lab work prior to leaving today. Bring back the stool specimen as soon as possible.  Please notify me if you become unable to eat or drink anything between now and when I receive your specimen results.  I will be in touch with you soon.  It was a pleasure meeting you!   Food Choices to Help Relieve Diarrhea, Adult When you have diarrhea, the foods you eat and your eating habits are very important. Choosing the right foods and drinks can help relieve diarrhea. Also, because diarrhea can last up to 7 days, you need to replace lost fluids and electrolytes (such as sodium, potassium, and chloride) in order to help prevent dehydration.  WHAT GENERAL GUIDELINES DO I NEED TO FOLLOW?  Slowly drink 1 cup (8 oz) of fluid for each episode of diarrhea. If you are getting enough fluid, your urine will be clear or pale yellow.  Eat starchy foods. Some good choices include white rice, white toast, pasta, low-fiber cereal, baked potatoes (without the skin), saltine crackers, and bagels.  Avoid large servings of any cooked vegetables.  Limit fruit to two servings per day. A serving is  cup or 1 small piece.  Choose foods with less than 2 g of fiber per serving.  Limit fats to less than 8 tsp (38 g) per day.  Avoid fried foods.  Eat foods that have probiotics in them. Probiotics can be found in certain dairy products.  Avoid foods and beverages that may increase the speed at which food moves through the stomach and intestines (gastrointestinal tract). Things to avoid include:  High-fiber foods, such as dried fruit, raw fruits and vegetables, nuts, seeds, and whole grain foods.  Spicy foods and high-fat foods.  Foods and beverages sweetened with high-fructose corn syrup, honey, or sugar alcohols such as xylitol, sorbitol, and mannitol. WHAT FOODS ARE RECOMMENDED? Grains White rice. White, [place], or pita breads (fresh or toasted), including plain rolls, buns, or bagels. White pasta.  Saltine, soda, or graham crackers. Pretzels. Low-fiber cereal. Cooked cereals made with water (such as cornmeal, farina, or cream cereals). Plain muffins. Matzo. Melba toast. Zwieback.  Vegetables Potatoes (without the skin). Strained tomato and vegetable juices. Most well-cooked and canned vegetables without seeds. Tender lettuce. Fruits Cooked or canned applesauce, apricots, cherries, fruit cocktail, grapefruit, peaches, pears, or plums. Fresh bananas, apples without skin, cherries, grapes, cantaloupe, grapefruit, peaches, oranges, or plums.  Meat and Other Protein Products Baked or boiled chicken. Eggs. Tofu. Fish. Seafood. Smooth peanut butter. Ground or well-cooked tender beef, ham, veal, lamb, pork, or poultry.  Dairy Plain yogurt, kefir, and unsweetened liquid yogurt. Lactose-free milk, buttermilk, or soy milk. Plain hard cheese. Beverages Sport drinks. Clear broths. Diluted fruit juices (except prune). Regular, caffeine-free sodas such as ginger ale. Water. Decaffeinated teas. Oral rehydration solutions. Sugar-free beverages not sweetened with sugar alcohols. Other Bouillon, broth, or soups made from recommended foods.  The items listed above may not be a complete list of recommended foods or beverages. Contact your dietitian for more options. WHAT FOODS ARE NOT RECOMMENDED? Grains Whole grain, whole wheat, bran, or rye breads, rolls, pastas, crackers, and cereals. Wild or brown rice. Cereals that contain more than 2 g of fiber per serving. Corn tortillas or taco shells. Cooked or dry oatmeal. Granola. Popcorn. Vegetables Raw vegetables. Cabbage, broccoli, Brussels sprouts, artichokes, baked beans, beet greens, corn, kale, legumes, peas, sweet potatoes, and yams. Potato skins. Cooked spinach and cabbage. Fruits Dried fruit, including raisins and dates. Raw fruits. Stewed or  dried prunes. Fresh apples with skin, apricots, mangoes, pears, raspberries, and strawberries.  Meat and Other  Protein Products Chunky peanut butter. Nuts and seeds. Beans and lentils. Berniece Salines.  Dairy High-fat cheeses. Milk, chocolate milk, and beverages made with milk, such as milk shakes. Cream. Ice cream. Sweets and Desserts Sweet rolls, doughnuts, and sweet breads. Pancakes and waffles. Fats and Oils Butter. Cream sauces. Margarine. Salad oils. Plain salad dressings. Olives. Avocados.  Beverages Caffeinated beverages (such as coffee, tea, soda, or energy drinks). Alcoholic beverages. Fruit juices with pulp. Prune juice. Soft drinks sweetened with high-fructose corn syrup or sugar alcohols. Other Coconut. Hot sauce. Chili powder. Mayonnaise. Gravy. Cream-based or milk-based soups.  The items listed above may not be a complete list of foods and beverages to avoid. Contact your dietitian for more information. WHAT SHOULD I DO IF I BECOME DEHYDRATED? Diarrhea can sometimes lead to dehydration. Signs of dehydration include dark urine and dry mouth and skin. If you think you are dehydrated, you should rehydrate with an oral rehydration solution. These solutions can be purchased at pharmacies, retail stores, or online.  Drink -1 cup (120-240 mL) of oral rehydration solution each time you have an episode of diarrhea. If drinking this amount makes your diarrhea worse, try drinking smaller amounts more often. For example, drink 1-3 tsp (5-15 mL) every 5-10 minutes.  A general rule for staying hydrated is to drink 1-2 L of fluid per day. Talk to your health care provider about the specific amount you should be drinking each day. Drink enough fluids to keep your urine clear or pale yellow.   This information is not intended to replace advice given to you by your health care provider. Make sure you discuss any questions you have with your health care provider.   Document Released: 05/07/2003 Document Revised: 03/07/2014 Document Reviewed: 01/07/2013 Elsevier Interactive Patient Education [employer].

## 2016-01-05 DIAGNOSIS — R197 Diarrhea, unspecified: Secondary | ICD-10-CM | POA: Diagnosis not present

## 2016-01-08 ENCOUNTER — Telehealth: Payer: Self-pay | Admitting: Primary Care

## 2016-01-08 NOTE — Telephone Encounter (Signed)
I'm still waiting for the final report from his stool specimen, but overall it's looking normal. Is he taking Imodium? Is he staying hydrated?

## 2016-01-08 NOTE — Telephone Encounter (Signed)
Did the Imodium help? If so then he needs to follow the package directions and take this until his diarrhea subsides. 1 dose may not be enough. If no improvement then please notify me immediately.

## 2016-01-08 NOTE — Telephone Encounter (Signed)
Patient stated he will get some more. He is not sure if it helped.  Spoken and notified patient of Kate's comments. Patient verbalized understanding.

## 2016-01-08 NOTE — Telephone Encounter (Signed)
Spoken and notified patient of Kate's comments.   No, patient is not taking Imodium but only once.  Yes, he is drinking plenty of fluids.

## 2016-01-08 NOTE — Telephone Encounter (Signed)
Noted. Suspect if he took this as directed his diarrhea would subside. Will check on patient Monday morning. Unremarkable work up thus far. Stable exam this week.

## 2016-01-08 NOTE — Telephone Encounter (Signed)
Pt called and is still having diarrhea. Pt would like call back on with what he needs to do. Please advise  [phone]

## 2016-01-09 LAB — STOOL CULTURE

## 2016-01-11 ENCOUNTER — Telehealth: Payer: Self-pay | Admitting: Primary Care

## 2016-01-11 NOTE — Telephone Encounter (Addendum)
-----   Message from Pleas Koch, NP sent at 01/08/2016  1:36 PM EST ----- Regarding: Diarrhea Please check on patient. How's his diarrhea? Is he taking Imodium? Is Imodium helping?

## 2016-01-11 NOTE — Telephone Encounter (Signed)
Spoken to patient. He stated that his diarrhea is a little better. Yes, patient is taking the Imodium as directed on the bottle and he believes it is helping.

## 2016-01-11 NOTE — Telephone Encounter (Signed)
Noted. Patient provided with return precautions.

## 2016-01-13 ENCOUNTER — Encounter: Payer: Self-pay | Admitting: Primary Care

## 2016-01-28 DIAGNOSIS — H903 Sensorineural hearing loss, bilateral: Secondary | ICD-10-CM | POA: Diagnosis not present

## 2016-02-17 ENCOUNTER — Ambulatory Visit: Payer: Commercial Managed Care - HMO | Admitting: Internal Medicine

## 2016-03-01 DIAGNOSIS — M9905 Segmental and somatic dysfunction of pelvic region: Secondary | ICD-10-CM | POA: Diagnosis not present

## 2016-03-01 DIAGNOSIS — M9903 Segmental and somatic dysfunction of lumbar region: Secondary | ICD-10-CM | POA: Diagnosis not present

## 2016-03-01 DIAGNOSIS — M955 Acquired deformity of pelvis: Secondary | ICD-10-CM | POA: Diagnosis not present

## 2016-03-01 DIAGNOSIS — M5416 Radiculopathy, lumbar region: Secondary | ICD-10-CM | POA: Diagnosis not present

## 2016-03-03 ENCOUNTER — Inpatient Hospital Stay
Admission: EM | Admit: 2016-03-03 | Discharge: 2016-03-05 | DRG: 175 | Disposition: A | Discharge status: Home / Self Care | Payer: Medicare HMO | Attending: Internal Medicine | Admitting: Internal Medicine

## 2016-03-03 ENCOUNTER — Emergency Department: Payer: Medicare HMO

## 2016-03-03 ENCOUNTER — Encounter: Payer: Self-pay | Admitting: Emergency Medicine

## 2016-03-03 DIAGNOSIS — N400 Enlarged prostate without lower urinary tract symptoms: Secondary | ICD-10-CM | POA: Diagnosis not present

## 2016-03-03 DIAGNOSIS — Z87891 Personal history of nicotine dependence: Secondary | ICD-10-CM

## 2016-03-03 DIAGNOSIS — I2692 Saddle embolus of pulmonary artery without acute cor pulmonale: Secondary | ICD-10-CM

## 2016-03-03 DIAGNOSIS — Z86711 Personal history of pulmonary embolism: Secondary | ICD-10-CM

## 2016-03-03 DIAGNOSIS — E039 Hypothyroidism, unspecified: Secondary | ICD-10-CM | POA: Diagnosis present

## 2016-03-03 DIAGNOSIS — I129 Hypertensive chronic kidney disease with stage 1 through stage 4 chronic kidney disease, or unspecified chronic kidney disease: Secondary | ICD-10-CM | POA: Diagnosis not present

## 2016-03-03 DIAGNOSIS — I82409 Acute embolism and thrombosis of unspecified deep veins of unspecified lower extremity: Secondary | ICD-10-CM | POA: Diagnosis not present

## 2016-03-03 DIAGNOSIS — Z96652 Presence of left artificial knee joint: Secondary | ICD-10-CM | POA: Diagnosis not present

## 2016-03-03 DIAGNOSIS — Z86718 Personal history of other venous thrombosis and embolism: Secondary | ICD-10-CM | POA: Diagnosis not present

## 2016-03-03 DIAGNOSIS — J9601 Acute respiratory failure with hypoxia: Secondary | ICD-10-CM | POA: Diagnosis present

## 2016-03-03 DIAGNOSIS — R0602 Shortness of breath: Secondary | ICD-10-CM

## 2016-03-03 DIAGNOSIS — Z823 Family history of stroke: Secondary | ICD-10-CM

## 2016-03-03 DIAGNOSIS — I499 Cardiac arrhythmia, unspecified: Secondary | ICD-10-CM | POA: Diagnosis not present

## 2016-03-03 DIAGNOSIS — N183 Chronic kidney disease, stage 3 unspecified: Secondary | ICD-10-CM

## 2016-03-03 DIAGNOSIS — I82411 Acute embolism and thrombosis of right femoral vein: Secondary | ICD-10-CM | POA: Diagnosis not present

## 2016-03-03 DIAGNOSIS — G20 Parkinson's disease: Secondary | ICD-10-CM | POA: Diagnosis present

## 2016-03-03 DIAGNOSIS — I2699 Other pulmonary embolism without acute cor pulmonale: Secondary | ICD-10-CM

## 2016-03-03 DIAGNOSIS — Z87442 Personal history of urinary calculi: Secondary | ICD-10-CM | POA: Diagnosis not present

## 2016-03-03 DIAGNOSIS — R7989 Other specified abnormal findings of blood chemistry: Secondary | ICD-10-CM

## 2016-03-03 DIAGNOSIS — R778 Other specified abnormalities of plasma proteins: Secondary | ICD-10-CM

## 2016-03-03 DIAGNOSIS — R55 Syncope and collapse: Secondary | ICD-10-CM | POA: Diagnosis not present

## 2016-03-03 DIAGNOSIS — I10 Essential (primary) hypertension: Secondary | ICD-10-CM | POA: Diagnosis present

## 2016-03-03 HISTORY — DX: Saddle embolus of pulmonary artery without acute cor pulmonale: I26.92

## 2016-03-03 LAB — URINALYSIS, COMPLETE (UACMP) WITH MICROSCOPIC
BILIRUBIN URINE: NEGATIVE
Bacteria, UA: NONE SEEN
GLUCOSE, UA: NEGATIVE mg/dL
Hgb urine dipstick: NEGATIVE
Ketones, ur: NEGATIVE mg/dL
LEUKOCYTES UA: NEGATIVE
Nitrite: NEGATIVE
PH: 8 (ref 5.0–8.0)
Protein, ur: NEGATIVE mg/dL
SQUAMOUS EPITHELIAL / LPF: NONE SEEN
Specific Gravity, Urine: 1.012 (ref 1.005–1.030)

## 2016-03-03 LAB — COMPREHENSIVE METABOLIC PANEL
ALBUMIN: 4.3 g/dL (ref 3.5–5.0)
ALT: <5 U/L — ABNORMAL LOW (ref 17–63)
ANION GAP: 8 (ref 5–15)
AST: 20 U/L (ref 15–41)
Alkaline Phosphatase: 89 U/L (ref 38–126)
BILIRUBIN TOTAL: 1.5 mg/dL — AB (ref 0.3–1.2)
BUN: 18 mg/dL (ref 6–20)
CO2: 28 mmol/L (ref 22–32)
Calcium: 9.2 mg/dL (ref 8.9–10.3)
Chloride: 105 mmol/L (ref 101–111)
Creatinine, Ser: 1.63 mg/dL — ABNORMAL HIGH (ref 0.61–1.24)
GFR calc Af Amer: 43 mL/min — ABNORMAL LOW (ref >60)
GFR calc non Af Amer: 37 mL/min — ABNORMAL LOW (ref >60)
Glucose, Bld: 102 mg/dL — ABNORMAL HIGH (ref 65–99)
POTASSIUM: 4.1 mmol/L (ref 3.5–5.1)
Sodium: 141 mmol/L (ref 135–145)
TOTAL PROTEIN: 7.7 g/dL (ref 6.5–8.1)

## 2016-03-03 LAB — CBC
HEMATOCRIT: 46.8 % (ref 40.0–52.0)
Hemoglobin: 15.7 g/dL (ref 13.0–18.0)
MCH: 30.6 pg (ref 26.0–34.0)
MCHC: 33.5 g/dL (ref 32.0–36.0)
MCV: 91.4 fL (ref 80.0–100.0)
PLATELETS: 174 10*3/uL (ref 150–440)
RBC: 5.13 MIL/uL (ref 4.40–5.90)
RDW: 13.8 % (ref 11.5–14.5)
WBC: 9.4 10*3/uL (ref 3.8–10.6)

## 2016-03-03 LAB — APTT: APTT: 32 s (ref 24–36)

## 2016-03-03 LAB — TROPONIN I
TROPONIN I: 0.04 ng/mL — AB (ref <0.03)
TROPONIN I: 0.06 ng/mL — AB (ref <0.03)

## 2016-03-03 LAB — PROTIME-INR
INR: 1.04
PROTHROMBIN TIME: 13.6 s (ref 11.4–15.2)

## 2016-03-03 LAB — MRSA PCR SCREENING: MRSA BY PCR: NEGATIVE

## 2016-03-03 LAB — FIBRIN DERIVATIVES D-DIMER (ARMC ONLY): Fibrin derivatives D-dimer (ARMC): >7500 — ABNORMAL HIGH (ref 0–499)

## 2016-03-03 MED ORDER — HEPARIN BOLUS VIA INFUSION
4100.0000 [IU] | Freq: Once | INTRAVENOUS | Status: AC
  Administered 2016-03-03: 4100 [IU] via INTRAVENOUS
  Filled 2016-03-03: qty 4100

## 2016-03-03 MED ORDER — ONDANSETRON HCL 4 MG PO TABS: 4.0000 mg | ORAL_TABLET | Freq: Four times a day (QID) | ORAL | Status: DC | PRN

## 2016-03-03 MED ORDER — CARBIDOPA-LEVODOPA 25-100 MG PO TABS
1.0000 | ORAL_TABLET | Freq: Three times a day (TID) | ORAL | Status: DC
  Administered 2016-03-03 – 2016-03-05 (×6): 1 via ORAL
  Filled 2016-03-03 (×6): qty 1

## 2016-03-03 MED ORDER — HEPARIN (PORCINE) IN NACL 100-0.45 UNIT/ML-% IJ SOLN
1300.0000 [IU]/h | INTRAMUSCULAR | Status: DC
  Administered 2016-03-03 – 2016-03-04 (×2): 1300 [IU]/h via INTRAVENOUS
  Filled 2016-03-03 (×2): qty 250

## 2016-03-03 MED ORDER — ACETAMINOPHEN 650 MG RE SUPP: 650.0000 mg | Freq: Four times a day (QID) | RECTAL | Status: DC | PRN

## 2016-03-03 MED ORDER — OXYCODONE HCL 5 MG PO TABS: 5.0000 mg | ORAL_TABLET | ORAL | Status: DC | PRN

## 2016-03-03 MED ORDER — ACETAMINOPHEN 325 MG PO TABS: 650.0000 mg | ORAL_TABLET | Freq: Four times a day (QID) | ORAL | Status: DC | PRN

## 2016-03-03 MED ORDER — DOCUSATE SODIUM 100 MG PO CAPS: 100.0000 mg | ORAL_CAPSULE | Freq: Two times a day (BID) | ORAL | Status: DC | PRN

## 2016-03-03 MED ORDER — IOPAMIDOL (ISOVUE-370) INJECTION 76%
60.0000 mL | Freq: Once | INTRAVENOUS | Status: AC | PRN
  Administered 2016-03-03: 60 mL via INTRAVENOUS

## 2016-03-03 MED ORDER — ONDANSETRON HCL 4 MG/2ML IJ SOLN: 4.0000 mg | Freq: Four times a day (QID) | INTRAMUSCULAR | Status: DC | PRN

## 2016-03-03 MED ORDER — POLYETHYLENE GLYCOL 3350 17 G PO PACK: 17.0000 g | PACK | Freq: Every day | ORAL | Status: DC | PRN

## 2016-03-03 MED ORDER — LEVOTHYROXINE SODIUM 112 MCG PO TABS
112.0000 ug | ORAL_TABLET | Freq: Every day | ORAL | Status: DC
  Administered 2016-03-04 – 2016-03-05 (×2): 112 ug via ORAL
  Filled 2016-03-03 (×2): qty 1

## 2016-03-03 MED ORDER — AMLODIPINE BESYLATE 5 MG PO TABS
5.0000 mg | ORAL_TABLET | Freq: Every day | ORAL | Status: DC
  Administered 2016-03-04 – 2016-03-05 (×2): 5 mg via ORAL
  Filled 2016-03-03 (×2): qty 1

## 2016-03-03 MED ORDER — HEPARIN SODIUM (PORCINE) 5000 UNIT/ML IJ SOLN: 4000.0000 [IU] | Freq: Once | INTRAMUSCULAR | Status: DC

## 2016-03-03 NOTE — ED Provider Notes (Signed)
[facility] Emergency Department Provider Note   ____________________________________________    I have reviewed the triage vital signs and the nursing notes.   HISTORY  Chief Complaint Near Syncope     HPI Walter [name] is a 83 y.o. male Who presents with complaints of chest pressure and shortness of breath which started abruptly at 12:30 pm today. He reports he was standing still and suddenly felt diffusely weak with chest pressure and labored breathing. He reports he continues to feel sob but chest pressure has improved. No fevers/chills. Felt well earlier today. No cough. Prior smoker. No n/v. Wife had URI recently. Recently flew to [place] for cruise.   Past Medical History:  Diagnosis Date  . ARF (acute renal failure) ([place]) 2011   after TKR  . BPH (benign prostatic hypertrophy)   . DVT (deep venous thrombosis) ([place]) 1998   after left knee arthroscopy  . Hypertension   . Hypothyroid 2007   postop from thyroidectomy (cancer scare)  . Kidney stones    recurrent  . Oral cancer ([place]) 2004   graft from left thigh  . Osteoarthritis of both knees   . Parkinson's disease ([place])   . Tendonitis 2012   from quinolone  . Toe amputation status ([place]) 2010    Patient Active Problem List   Diagnosis Date Noted  . Vitamin B12 deficiency 09/09/2014  . Preventative health care 08/04/2014  . Parkinson's disease ([place]) 08/04/2014  . Esophageal diverticulum 01/01/2014  . Actinic keratosis 07/31/2013  . Hypertension   . Osteoarthritis of both knees   . Toe amputation status ([place])   . BPH (benign prostatic hypertrophy)   . Kidney stones   . Hypothyroid   . Tendonitis     Past Surgical History:  Procedure Laterality Date  . APPENDECTOMY    . CHOLECYSTECTOMY    . HERNIA REPAIR    . LITHOTRIPSY  2009/2010   then stent and cystoscopic removal 2014  . Oral cancer removed Right 2004  . REPAIR ZENKER'S DIVERTICULA  2013  . THYROIDECTOMY  2007  . TOE  AMPUTATION Right 2010   badly overlapping other toe  . TOTAL KNEE ARTHROPLASTY Left   . TRANSURETHRAL RESECTION OF PROSTATE  2011  . UMBILICAL HERNIA REPAIR  2007    Prior to Admission medications   Medication Sig Start Date End Date Taking? Authorizing Provider  amLODipine (NORVASC) 5 MG tablet TAKE 1 TABLET EVERY DAY 10/13/15   Venia Carbon, MD  carbidopa-levodopa (SINEMET IR) 25-100 MG tablet TAKE 1 TABLET THREE TIMES DAILY 08/31/15   Venia Carbon, MD  Cholecalciferol (VITAMIN D) 2000 UNITS CAPS Take by mouth daily.    Historical Provider, MD  docusate sodium (COLACE) 100 MG capsule Take 100 mg by mouth 2 (two) times daily as needed.     Historical Provider, MD  levothyroxine (SYNTHROID, LEVOTHROID) 112 MCG tablet TAKE 1 TABLET EVERY DAY BEFORE BREAKFAST 09/15/15   Venia Carbon, MD  polyethylene glycol [facility] / GLYCOLAX) packet Take 17 g by mouth daily as needed.     Historical Provider, MD  vitamin B-12 (CYANOCOBALAMIN) 1000 MCG tablet Take 1,000 mcg by mouth daily.    Historical Provider, MD     Allergies Quinolones  Family History  Problem Relation Age of Onset  . Dementia Mother   . Stroke Father   . Pulmonary disease Brother   . Heart disease Neg Hx   . Diabetes Neg Hx     Social History  Social History  Substance Use Topics  . Smoking status: Former Smoker    Types: Cigarettes    Quit date: 04/27/2002  . Smokeless tobacco: Never Used  . Alcohol use 0.0 oz/week     Comment: one drink daily (wine or scotch)    Review of Systems  Constitutional: No fever Eyes: No visual changes.  ENT: No sore throat. Cardiovascular: as above Respiratory: as above Gastrointestinal: No abdominal pain.  No nausea, no vomiting.    Musculoskeletal: Negative for back pain. Skin: Negative for rash. Neurological: Negative for headaches or weakness  10-point ROS otherwise negative.  ____________________________________________   PHYSICAL EXAM:  VITAL SIGNS: ED Triage  Vitals  Enc Vitals Group     BP 03/03/16 1449 (!) 161/98     Pulse Rate 03/03/16 1449 81     Resp 03/03/16 1449 17     Temp 03/03/16 1449 98 F (36.7 C)     Temp Source 03/03/16 1449 Oral     SpO2 03/03/16 1449 (!) 89 %     Weight 03/03/16 1450 180 lb (81.6 kg)     Height 03/03/16 1450 5\' 10"  (1.778 m)     Head Circumference --      Peak Flow --      Pain Score --      Pain Loc --      Pain Edu? --      Excl. in [place]? --     Constitutional: Alert and oriented. No acute distress. Pleasant and interactive Eyes: Conjunctivae are normal.   Nose: No congestion/rhinnorhea. Mouth/Throat: Mucous membranes are moist.    Cardiovascular: Normal rate, regular rhythm. Grossly normal heart sounds.  Good peripheral circulation. Respiratory: Normal respiratory effort.  No retractions. Lungs CTAB. Gastrointestinal: Soft and nontender. No distention.  No CVA tenderness. Genitourinary: deferred Musculoskeletal: No lower extremity tenderness nor edema.  Warm and well perfused Neurologic:  Normal speech and language. No gross focal neurologic deficits are appreciated.  Skin:  Skin is warm, dry and intact. No rash noted. Psychiatric: Mood and affect are normal. Speech and behavior are normal.  ____________________________________________   LABS (all labs ordered are listed, but only abnormal results are displayed)  Labs Reviewed  CBC  URINALYSIS, COMPLETE (UACMP) WITH MICROSCOPIC  COMPREHENSIVE METABOLIC PANEL  TROPONIN I  FIBRIN DERIVATIVES D-DIMER ([facility] ONLY)   ____________________________________________  EKG  ED ECG REPORT I, Lavonia Drafts, the attending physician, personally viewed and interpreted this ECG.  Date: 03/03/2016 EKG Time: 2:49 PM Rate: 82 Rhythm: normal sinus rhythm QRS Axis: normal Intervals: normal ST/T Wave abnormalities: nonspecific Conduction Disturbances: none   ____________________________________________  RADIOLOGY  cxr unremarkable CT angio  demonstrates large saddle embolus ____________________________________________   PROCEDURES  Procedure(s) performed: No    Critical Care performed: yes  CRITICAL CARE Performed by: Lavonia Drafts   Total critical care time: 30 minutes  Critical care time was exclusive of separately billable procedures and treating other patients.  Critical care was necessary to treat or prevent imminent or life-threatening deterioration.  Critical care was time spent personally by me on the following activities: development of treatment plan with patient and/or surrogate as well as nursing, discussions with consultants, evaluation of patient's response to treatment, examination of patient, obtaining history from patient or surrogate, ordering and performing treatments and interventions, ordering and review of laboratory studies, ordering and review of radiographic studies, pulse oximetry and re-evaluation of patient's condition.  ____________________________________________   INITIAL IMPRESSION / ASSESSMENT AND PLAN / ED COURSE  Pertinent  labs & imaging results that were available during my care of the patient were reviewed by me and considered in my medical decision making (see chart for details).  Patient presents with concerning abrupt onset of chest pressure and sob. His ekg is non specific. He feels improved now. Vitals significant for htn and hypoxia. We have him on [place] o2. Diff dx includes ACS, nstemi, PE, near syncope, pneumonia  Clinical Course   ----------------------------------------- 5:45 PM on 03/03/2016 -----------------------------------------  Called by radiologist and informed of saddle embolus but no evidence of right heart strain. Patient is requiring O2, we will start heparin and admit him to the hospital ____________________________________________   FINAL CLINICAL IMPRESSION(S) / ED DIAGNOSES  Final diagnoses:  Acute saddle pulmonary embolism without acute cor  pulmonale (HCC)      NEW MEDICATIONS STARTED DURING THIS VISIT:  New Prescriptions   No medications on file     Note:  This document was prepared using Dragon voice recognition software and may include unintentional dictation errors.    Lavonia Drafts, MD 03/03/16 [phone]

## 2016-03-03 NOTE — ED Notes (Signed)
Pt cleaned and changed.  Transported to CT.

## 2016-03-03 NOTE — H&P (Signed)
[place] [facility] [place] NAME: [name]    MR#:  [id number]  DATE OF BIRTH:  25-Jun-1931  DATE OF ADMISSION:  03/03/2016  PRIMARY CARE PHYSICIAN: Viviana Simpler, MD   REQUESTING/REFERRING PHYSICIAN: Corky Downs, MD  CHIEF COMPLAINT:   Chief Complaint  Patient presents with  . Near Syncope    HISTORY OF PRESENT ILLNESS:  [name]  is a 83 y.o. male who presents with Episode of chest pain and shortness of breath. Patient states this was acute onset today. He came to the ED for evaluation and was found here to have saddle embolus in his lungs on CT scan. Patient does state that he traveled the first Of December, which included a couple of flights for several hours at a time as well as waiting time in the airports in between. He has had clot in his lower extremity before, but this is provoked after knee replacement surgery. On CT scan he did not have any right heart strain, and hospitalists were called for admission and initiation of anticoagulation.  PAST MEDICAL HISTORY:   Past Medical History:  Diagnosis Date  . ARF (acute renal failure) ([place]) 2011   after TKR  . BPH (benign prostatic hypertrophy)   . DVT (deep venous thrombosis) ([place]) 1998   after left knee arthroscopy  . Hypertension   . Hypothyroid 2007   postop from thyroidectomy (cancer scare)  . Kidney stones    recurrent  . Oral cancer ([place]) 2004   graft from left thigh  . Osteoarthritis of both knees   . Parkinson's disease ([place])   . Tendonitis 2012   from quinolone  . Toe amputation status ([place]) 2010    PAST SURGICAL HISTORY:   Past Surgical History:  Procedure Laterality Date  . APPENDECTOMY    . CHOLECYSTECTOMY    . HERNIA REPAIR    . LITHOTRIPSY  2009/2010   then stent and cystoscopic removal 2014  . Oral cancer removed Right 2004  . REPAIR ZENKER'S DIVERTICULA  2013  . THYROIDECTOMY  2007  . TOE AMPUTATION Right 2010   badly overlapping other toe  .  TOTAL KNEE ARTHROPLASTY Left   . TRANSURETHRAL RESECTION OF PROSTATE  2011  . UMBILICAL HERNIA REPAIR  2007    SOCIAL HISTORY:   Social History  Substance Use Topics  . Smoking status: Former Smoker    Types: Cigarettes    Quit date: 04/27/2002  . Smokeless tobacco: Never Used  . Alcohol use 0.0 oz/week     Comment: one drink daily (wine or scotch)    FAMILY HISTORY:   Family History  Problem Relation Age of Onset  . Dementia Mother   . Stroke Father   . Pulmonary disease Brother   . Heart disease Neg Hx   . Diabetes Neg Hx     DRUG ALLERGIES:   Allergies  Allergen Reactions  . Quinolones     tendonitis    MEDICATIONS AT HOME:   Prior to Admission medications   Medication Sig Start Date End Date Taking? Authorizing Provider  amLODipine (NORVASC) 5 MG tablet TAKE 1 TABLET EVERY DAY 10/13/15  Yes Venia Carbon, MD  carbidopa-levodopa (SINEMET IR) 25-100 MG tablet TAKE 1 TABLET THREE TIMES DAILY 08/31/15  Yes Venia Carbon, MD  Cholecalciferol (VITAMIN D) 2000 UNITS CAPS Take by mouth daily.   Yes Historical Provider, MD  docusate sodium (COLACE) 100 MG capsule Take 100 mg by mouth 2 (two)  times daily as needed.    Yes Historical Provider, MD  levothyroxine (SYNTHROID, LEVOTHROID) 112 MCG tablet TAKE 1 TABLET EVERY DAY BEFORE BREAKFAST 09/15/15  Yes Venia Carbon, MD  polyethylene glycol (MIRALAX / GLYCOLAX) packet Take 17 g by mouth daily as needed.    Yes Historical Provider, MD  vitamin B-12 (CYANOCOBALAMIN) 1000 MCG tablet Take 1,000 mcg by mouth daily.   Yes Historical Provider, MD    REVIEW OF SYSTEMS:  Review of Systems  Constitutional: Negative for chills, fever, malaise/fatigue and weight loss.  HENT: Negative for ear pain, hearing loss and tinnitus.   Eyes: Negative for blurred vision, double vision, pain and redness.  Respiratory: Positive for shortness of breath. Negative for cough and hemoptysis.   Cardiovascular: Positive for chest pain. Negative  for palpitations, orthopnea and leg swelling.  Gastrointestinal: Negative for abdominal pain, constipation, diarrhea, nausea and vomiting.  Genitourinary: Negative for dysuria, frequency and hematuria.  Musculoskeletal: Negative for back pain, joint pain and neck pain.  Skin:       No acne, rash, or lesions  Neurological: Negative for dizziness, tremors, focal weakness and weakness.  Endo/Heme/Allergies: Negative for polydipsia. Does not bruise/bleed easily.  Psychiatric/Behavioral: Negative for depression. The patient is not nervous/anxious and does not have insomnia.      VITAL SIGNS:   Vitals:   03/03/16 1450 03/03/16 1630 03/03/16 1723 03/03/16 1730  BP:  (!) 141/77 (!) 169/88 (!) 165/90  Pulse:   80 83  Resp:   16 18  Temp:      TempSrc:      SpO2:   95% 90%  Weight: 81.6 kg (180 lb)     Height: 5\' 10"  (1.778 m)      Wt Readings from Last 3 Encounters:  03/03/16 81.6 kg (180 lb)  01/04/16 82.7 kg (182 lb 6.4 oz)  11/23/15 82.1 kg (181 lb)    PHYSICAL EXAMINATION:  Physical Exam  Vitals reviewed. Constitutional: He is oriented to person, place, and time. He appears well-developed and well-nourished. No distress.  HENT:  Head: Normocephalic and atraumatic.  Mouth/Throat: Oropharynx is clear and moist.  Eyes: Conjunctivae and EOM are normal. Pupils are equal, round, and reactive to light. No scleral icterus.  Neck: Normal range of motion. Neck supple. No JVD present. No thyromegaly present.  Cardiovascular: Normal rate, regular rhythm and intact distal pulses.  Exam reveals no gallop and no friction rub.   No murmur heard. Respiratory: Effort normal and breath sounds normal. No respiratory distress. He has no wheezes. He has no rales.  GI: Soft. Bowel sounds are normal. He exhibits no distension. There is no tenderness.  Musculoskeletal: Normal range of motion. He exhibits tenderness (left calf). He exhibits no edema.  No arthritis, no gout  Lymphadenopathy:    He has  no cervical adenopathy.  Neurological: He is alert and oriented to person, place, and time. No cranial nerve deficit.  Mild resting tremor, No dysarthria, no aphasia  Skin: Skin is warm and dry. No rash noted. No erythema.  Psychiatric: He has a normal mood and affect. His behavior is normal. Judgment and thought content normal.    LABORATORY PANEL:   CBC  Recent Labs Lab 03/03/16 1456  WBC 9.4  HGB 15.7  HCT 46.8  PLT 174   ------------------------------------------------------------------------------------------------------------------  Chemistries   Recent Labs Lab 03/03/16 1537  NA 141  K 4.1  CL 105  CO2 28  GLUCOSE 102*  BUN 18  CREATININE 1.63*  CALCIUM  9.2  AST 20  ALT <5*  ALKPHOS 89  BILITOT 1.5*   ------------------------------------------------------------------------------------------------------------------  Cardiac Enzymes  Recent Labs Lab 03/03/16 1537  TROPONINI 0.04*   ------------------------------------------------------------------------------------------------------------------  RADIOLOGY:  Ct Angio Chest Pe W And/or Wo Contrast  Result Date: 03/03/2016 CLINICAL DATA:  Chest pain and shortness of breath. EXAM: CT ANGIOGRAPHY CHEST WITH CONTRAST TECHNIQUE: Multidetector CT imaging of the chest was performed using the standard protocol during bolus administration of intravenous contrast. Multiplanar CT image reconstructions and MIPs were obtained to evaluate the vascular anatomy. CONTRAST:  60 mL of Isovue 370 COMPARISON:  Chest x-ray from earlier today FINDINGS: Cardiovascular: Bilateral pulmonary emboli are identified. There is a saddle embolus as seen on series 4, image 51. Emboli extend into all lobes including the upper lobes bilaterally, the right middle lobe, and the bilateral lower lobes. There is a moderate to large burden of pulmonary emboli. The maximum right atrial diameter is 3.7 cm on image 77 and the left ventricular diameter is  4.9 cm on series 4, image 77 with no evidence of heart strain. Coronary artery calcifications are identified. The thoracic aorta is normal in caliber with atherosclerosis identified. No dissection is seen. Identified Mediastinum/Nodes: There is a small hiatal hernia with possible thickening of the distal esophagus as seen on series 4, image 91. The trachea is unremarkable. There is a mildly prominent node as seen to the right of the aorta on series 4, image 98, nonspecific but possibly reactive. No other evidence of adenopathy. Lungs/Pleura: Mild atelectasis in the left base. No suspicious infiltrate. The central airways are normal. No pneumothorax. No suspicious pulmonary nodules or masses. Upper Abdomen:  The upper abdomen is unremarkable. Musculoskeletal: No chest wall abnormality. No acute or significant osseous findings. Review of the MIP images confirms the above findings. IMPRESSION: 1. Moderate to large burden of pulmonary emboli in all lobes including a saddle embolus with no evidence of heart strain. 2. Small hiatal hernia with possible associated wall thickening in the distal esophagus. Recommend upper GI or direct visualization for better evaluation. Findings called to Dr. Corky Downs. Electronically Signed   By: Dorise Bullion III M.D   On: 03/03/2016 17:37   Dg Chest Port 1 View  Result Date: 03/03/2016 CLINICAL DATA:  Near syncope. Chest pressure like pain, diaphoresis and paleness. No known cardiopulmonary abnormality. History of Parkinson's disease and acute renal failure. EXAM: PORTABLE CHEST 1 VIEW COMPARISON:  None in PACs FINDINGS: The lungs are mildly hyperinflated but clear. The heart and pulmonary vascularity are normal. The mediastinum is normal in width. There is calcification within the wall of the thoracic aorta. There is no pleural effusion or pneumothorax. The observed bony thorax exhibits no acute abnormality. IMPRESSION: Mild hyperinflation may be voluntary or may reflect underlying  reactive airway disease or COPD. There is no pneumonia, CHF, nor other acute cardiopulmonary abnormality. Thoracic aortic atherosclerosis. Electronically Signed   By: Aritza Brunet  [place] M.D.   On: 03/03/2016 15:45    EKG:   Orders placed or performed during the hospital encounter of 03/03/16  . ED EKG  . ED EKG  . EKG 12-Lead  . EKG 12-Lead    IMPRESSION AND PLAN:  Principal Problem:   Pulmonary emboli (HCC) - likely due to migration from the left lower extremity DVT. Given that confirmation of left lower showing a DVT would not change his current plan of treatment I will forego ordering ultrasound at this time. He is been started on a heparin drip, and tomorrow  will likely need initiation of oral anticoagulation. We'll get an echocardiogram as well, CTA chest did not show right heart strain. Active Problems:   Hypertension - stable, continue home meds   Parkinson's disease ([place]) - continue home medications   Benign prostatic hyperplasia - continue home meds   Hypothyroid - home dose thyroid replacement  All the records are reviewed and case discussed with ED provider. Management plans discussed with the patient and/or family.  DVT PROPHYLAXIS: Systemic anticoagulation  GI PROPHYLAXIS: None  ADMISSION STATUS: Inpatient  CODE STATUS: Full Code Status History    This patient does not have a recorded code status. Please follow your organizational policy for patients in this situation.    Advance Directive Documentation   Flowsheet Row Most Recent Value  Type of Advance Directive  Healthcare Power of Attorney, Living will  Pre-existing out of facility DNR order (yellow form or pink MOST form)  No data  "MOST" Form in Place?  No data      TOTAL TIME TAKING CARE OF THIS PATIENT: 45 minutes.    Torryn Fiske [place] 03/03/2016, 6:22 PM  [facility]  Office  [phone]  CC: Primary care physician; Viviana Simpler, MD

## 2016-03-03 NOTE — ED Notes (Signed)
Gave pt urinal 

## 2016-03-03 NOTE — Consult Note (Addendum)
ANTICOAGULATION CONSULT NOTE - [place] for heparin Indication: pulmonary embolus  Allergies  Allergen Reactions  . Quinolones     tendonitis    Patient Measurements: Height: 5\' 10"  (177.8 cm) Weight: 180 lb (81.6 kg) IBW/kg (Calculated) : 73 Heparin Dosing Weight: 81.6kg  Vital Signs: Temp: 98 F (36.7 C) (01/04 1449) Temp Source: Oral (01/04 1449) BP: 165/88 (01/04 1830) Pulse Rate: 81 (01/04 1830)  Labs:  Recent Labs  03/03/16 1456 03/03/16 1537  HGB 15.7  --   HCT 46.8  --   PLT 174  --   APTT  --  32  LABPROT  --  13.6  INR  --  1.04  CREATININE  --  1.63*  TROPONINI  --  0.04*    Estimated Creatinine Clearance: 35.5 mL/min (by C-G formula based on SCr of 1.63 mg/dL (H)).   Medications:  Scheduled:    Assessment: Pt is a 83 year old male who presents with an episode of chest pain and SOB. Pt found to have saddle embolus in his lungs. Pt does have a hx of DVT, provoked by knee replacement, however does not appear to be on home anticoagulation at this time. Pharmacy consulted to dose heparin drip. Baseline labs ordered  Goal of Therapy:  Heparin level 0.3-0.7 units/ml Monitor platelets by anticoagulation protocol: Yes   Plan:  Give 4100 units bolus x 1 Start heparin infusion at 1300 units/hr Check anti-Xa level in 8 hours and daily while on heparin Continue to monitor H&H and platelets   01/05 0306 HL therapeutic x 1. Continue current rate. Will recheck HL in 8 hours. [place], Pharm.D, BCPS Clinical Pharmacist  03/03/2016,7:27 PM

## 2016-03-03 NOTE — ED Triage Notes (Signed)
Pt to ED via EMS from [facility] private home c/o near syncope, chest pressure, pale and diaphoretic while standing, denies LOC.  EMS presents A&Ox4, speaking in complete and coherent sentences, chest rise even and unlabored, skin warm and dry and denies current symptoms.

## 2016-03-03 NOTE — ED Notes (Signed)
Changed pt brief.

## 2016-03-03 NOTE — ED Notes (Signed)
Pt O2 sats 89%, placed on 2L [place].

## 2016-03-04 ENCOUNTER — Inpatient Hospital Stay
Admit: 2016-03-04 | Discharge: 2016-03-04 | Disposition: A | Discharge status: Home / Self Care | Payer: Medicare HMO | Attending: Internal Medicine | Admitting: Internal Medicine

## 2016-03-04 ENCOUNTER — Inpatient Hospital Stay: Payer: Medicare HMO

## 2016-03-04 LAB — CBC
HCT: 42.1 % (ref 40.0–52.0)
Hemoglobin: 14.3 g/dL (ref 13.0–18.0)
MCH: 30.6 pg (ref 26.0–34.0)
MCHC: 34.1 g/dL (ref 32.0–36.0)
MCV: 89.7 fL (ref 80.0–100.0)
PLATELETS: 165 10*3/uL (ref 150–440)
RBC: 4.69 MIL/uL (ref 4.40–5.90)
RDW: 13.3 % (ref 11.5–14.5)
WBC: 6.7 10*3/uL (ref 3.8–10.6)

## 2016-03-04 LAB — ANTITHROMBIN III: AntiThromb III Func: 84 % (ref 75–120)

## 2016-03-04 LAB — BASIC METABOLIC PANEL
Anion gap: 5 (ref 5–15)
BUN: 16 mg/dL (ref 6–20)
CALCIUM: 8.4 mg/dL — AB (ref 8.9–10.3)
CO2: 29 mmol/L (ref 22–32)
CREATININE: 1.42 mg/dL — AB (ref 0.61–1.24)
Chloride: 108 mmol/L (ref 101–111)
GFR calc non Af Amer: 44 mL/min — ABNORMAL LOW (ref >60)
GFR, EST AFRICAN AMERICAN: 51 mL/min — AB (ref >60)
GLUCOSE: 103 mg/dL — AB (ref 65–99)
Potassium: 3.9 mmol/L (ref 3.5–5.1)
Sodium: 142 mmol/L (ref 135–145)

## 2016-03-04 LAB — HEPARIN LEVEL (UNFRACTIONATED)
HEPARIN UNFRACTIONATED: 0.44 [IU]/mL (ref 0.30–0.70)
Heparin Unfractionated: 0.49 IU/mL (ref 0.30–0.70)

## 2016-03-04 LAB — TROPONIN I
TROPONIN I: 0.04 ng/mL — AB (ref <0.03)
TROPONIN I: 0.03 ng/mL — AB (ref <0.03)

## 2016-03-04 MED ORDER — APIXABAN 5 MG PO TABS
10.0000 mg | ORAL_TABLET | Freq: Two times a day (BID) | ORAL | Status: DC
  Administered 2016-03-04 – 2016-03-05 (×3): 10 mg via ORAL
  Filled 2016-03-04 (×3): qty 2

## 2016-03-04 MED ORDER — APIXABAN 5 MG PO TABS: 5.0000 mg | ORAL_TABLET | Freq: Two times a day (BID) | ORAL | Status: DC

## 2016-03-04 NOTE — Care Management (Signed)
It appears patient will discharge home on Eliquis.  Provided coupon

## 2016-03-04 NOTE — Progress Notes (Signed)
ANTICOAGULATION CONSULT NOTE - Initial Consult  Pharmacy Consult for Apixaban Indication: pulmonary embolus  Allergies  Allergen Reactions  . Quinolones     tendonitis    Patient Measurements: Height: 5\' 10"  (177.8 cm) Weight: 176 lb 14.4 oz (80.2 kg) IBW/kg (Calculated) : 73 Heparin Dosing Weight:   Vital Signs: Temp: 98.3 F (36.8 C) (01/05 1130) Temp Source: Oral (01/05 1130) BP: 147/75 (01/05 1130) Pulse Rate: 78 (01/05 1130)  Labs:  Recent Labs  03/03/16 1456  03/03/16 1537 03/03/16 2230 03/04/16 0306 03/04/16 1040  HGB 15.7  --   --   --  14.3  --   HCT 46.8  --   --   --  42.1  --   PLT 174  --   --   --  165  --   APTT  --   --  32  --   --   --   LABPROT  --   --  13.6  --   --   --   INR  --   --  1.04  --   --   --   HEPARINUNFRC  --   --   --   --  0.44 0.49  CREATININE  --   --  1.63*  --  1.42*  --   TROPONINI  --   < > 0.04* 0.06* 0.04* 0.03*  < > = values in this interval not displayed.  Estimated Creatinine Clearance: 40.7 mL/min (by C-G formula based on SCr of 1.42 mg/dL (H)).   Medical History: Past Medical History:  Diagnosis Date  . ARF (acute renal failure) ([place]) 2011   after TKR  . BPH (benign prostatic hypertrophy)   . DVT (deep venous thrombosis) ([place]) 1998   after left knee arthroscopy  . Hypertension   . Hypothyroid 2007   postop from thyroidectomy (cancer scare)  . Kidney stones    recurrent  . Oral cancer ([place]) 2004   graft from left thigh  . Osteoarthritis of both knees   . Parkinson's disease ([place])   . Tendonitis 2012   from quinolone  . Toe amputation status ([place]) 2010    Medications:  Prescriptions Prior to Admission  Medication Sig Dispense Refill Last Dose  . amLODipine (NORVASC) 5 MG tablet TAKE 1 TABLET EVERY DAY 90 tablet 3 03/03/2016 at 0800  . carbidopa-levodopa (SINEMET IR) 25-100 MG tablet TAKE 1 TABLET THREE TIMES DAILY 270 tablet 0 03/03/2016 at 0800  . Cholecalciferol (VITAMIN D) 2000 UNITS CAPS Take by  mouth daily.   03/03/2016 at 0800  . docusate sodium (COLACE) 100 MG capsule Take 100 mg by mouth 2 (two) times daily as needed.    prn at prn  . levothyroxine (SYNTHROID, LEVOTHROID) 112 MCG tablet TAKE 1 TABLET EVERY DAY BEFORE BREAKFAST 90 tablet 0 03/03/2016 at 0800  . polyethylene glycol (MIRALAX / GLYCOLAX) packet Take 17 g by mouth daily as needed.    prn at prn  . vitamin B-12 (CYANOCOBALAMIN) 1000 MCG tablet Take 1,000 mcg by mouth daily.   03/03/2016 at 0800   Scheduled:  . amLODipine  5 mg Oral Daily  . apixaban  10 mg Oral BID   Followed by  . [START ON 03/11/2016] apixaban  5 mg Oral BID  . carbidopa-levodopa  1 tablet Oral TID  . levothyroxine  112 mcg Oral QAC breakfast    Assessment: Pharmacy consulted to dose and monitor apixaban therapy in this 83 year old male being  treated for pulmonary embolism. Goal of Therapy:   Monitor platelets by anticoagulation protocol: Yes   Plan:  Will start apixaban 10 mg PO BID x 7 days then 5 mg PO BID thereafter. Patient previously on heparin.    Theta Leaf D 03/04/2016,2:20 PM

## 2016-03-04 NOTE — Care Management Important Message (Signed)
Important Message  Patient Details  Name: Samiir Loehrer MRN: [id number] Date of Birth: 02-Oct-1931   Medicare Important Message Given:  Yes  Initial signed IM printed from Epic and given to patient.     Katrina Stack, RN 03/04/2016, 9:34 AM

## 2016-03-04 NOTE — Care Management (Signed)
Patient is a resident of [name] with his wife  Current need for oxygen is acute.  Independent in all adls, denies issues accessing medical care, obtaining medications or with transportation.  Current with PCP.  Admitted with bilateral pulmonary emboli.  Does not have a mechanical heart valve.  Currently on heparin drip and requiring supplemental 02.  Discussed during progression of need to wean 02 and mobilize when medically cleared.  Will anticipate proving an anticoagulant coupon for 30 day trial

## 2016-03-04 NOTE — Progress Notes (Signed)
[place] [facility] [place] NAME: Lowel Beston    MR#:  [id number]  DATE OF BIRTH:  13-May-1931  SUBJECTIVE:  CHIEF COMPLAINT:   Chief Complaint  Patient presents with  . Near Syncope   Continues to have mild chest tightness. Some shortness of breath. No palpitations. No bleeding. On heparin drip.  REVIEW OF SYSTEMS:    Review of Systems  Constitutional: Positive for malaise/fatigue. Negative for chills and fever.  HENT: Negative for sore throat.   Eyes: Negative for blurred vision, double vision and pain.  Respiratory: Positive for shortness of breath. Negative for cough, hemoptysis and wheezing.   Cardiovascular: Positive for chest pain. Negative for palpitations, orthopnea and leg swelling.  Gastrointestinal: Negative for abdominal pain, constipation, diarrhea, heartburn, nausea and vomiting.  Genitourinary: Negative for dysuria and hematuria.  Musculoskeletal: Negative for back pain and joint pain.  Skin: Negative for rash.  Neurological: Positive for weakness. Negative for sensory change, speech change, focal weakness and headaches.  Endo/Heme/Allergies: Does not bruise/bleed easily.  Psychiatric/Behavioral: Negative for depression. The patient is not nervous/anxious.     DRUG ALLERGIES:   Allergies  Allergen Reactions  . Quinolones     tendonitis    VITALS:  Blood pressure (!) 147/75, pulse 78, temperature 98.3 F (36.8 C), temperature source Oral, resp. rate 17, height 5\' 10"  (1.778 m), weight 80.2 kg (176 lb 14.4 oz), SpO2 94 %.  PHYSICAL EXAMINATION:   Physical Exam  GENERAL:  83 y.o.-year-old patient lying in the bed with no acute distress.  EYES: Pupils equal, round, reactive to light and accommodation. No scleral icterus. Extraocular muscles intact.  HEENT: Head atraumatic, normocephalic. Oropharynx and nasopharynx clear.  NECK:  Supple, no jugular venous distention. No thyroid enlargement, no tenderness.  LUNGS: Normal  breath sounds bilaterally, no wheezing, rales, rhonchi. No use of accessory muscles of respiration.  CARDIOVASCULAR: S1, S2 normal. No murmurs, rubs, or gallops.  ABDOMEN: Soft, nontender, nondistended. Bowel sounds present. No organomegaly or mass.  EXTREMITIES: No cyanosis, clubbing or edema b/l.    NEUROLOGIC: Cranial nerves II through XII are intact. No focal Motor or sensory deficits b/l.   PSYCHIATRIC: The patient is alert and oriented x 3.  SKIN: No obvious rash, lesion, or ulcer.   LABORATORY PANEL:   CBC  Recent Labs Lab 03/04/16 0306  WBC 6.7  HGB 14.3  HCT 42.1  PLT 165   ------------------------------------------------------------------------------------------------------------------ Chemistries   Recent Labs Lab 03/03/16 1537 03/04/16 0306  NA 141 142  K 4.1 3.9  CL 105 108  CO2 28 29  GLUCOSE 102* 103*  BUN 18 16  CREATININE 1.63* 1.42*  CALCIUM 9.2 8.4*  AST 20  --   ALT <5*  --   ALKPHOS 89  --   BILITOT 1.5*  --    ------------------------------------------------------------------------------------------------------------------  Cardiac Enzymes  Recent Labs Lab 03/04/16 1040  TROPONINI 0.03*   ------------------------------------------------------------------------------------------------------------------  RADIOLOGY:  Ct Angio Chest Pe W And/or Wo Contrast  Result Date: 03/03/2016 CLINICAL DATA:  Chest pain and shortness of breath. EXAM: CT ANGIOGRAPHY CHEST WITH CONTRAST TECHNIQUE: Multidetector CT imaging of the chest was performed using the standard protocol during bolus administration of intravenous contrast. Multiplanar CT image reconstructions and MIPs were obtained to evaluate the vascular anatomy. CONTRAST:  60 mL of Isovue 370 COMPARISON:  Chest x-ray from earlier today FINDINGS: Cardiovascular: Bilateral pulmonary emboli are identified. There is a saddle embolus as seen on series 4, image 51. Emboli extend  into all lobes including the  upper lobes bilaterally, the right middle lobe, and the bilateral lower lobes. There is a moderate to large burden of pulmonary emboli. The maximum right atrial diameter is 3.7 cm on image 77 and the left ventricular diameter is 4.9 cm on series 4, image 77 with no evidence of heart strain. Coronary artery calcifications are identified. The thoracic aorta is normal in caliber with atherosclerosis identified. No dissection is seen. Identified Mediastinum/Nodes: There is a small hiatal hernia with possible thickening of the distal esophagus as seen on series 4, image 91. The trachea is unremarkable. There is a mildly prominent node as seen to the right of the aorta on series 4, image 98, nonspecific but possibly reactive. No other evidence of adenopathy. Lungs/Pleura: Mild atelectasis in the left base. No suspicious infiltrate. The central airways are normal. No pneumothorax. No suspicious pulmonary nodules or masses. Upper Abdomen:  The upper abdomen is unremarkable. Musculoskeletal: No chest wall abnormality. No acute or significant osseous findings. Review of the MIP images confirms the above findings. IMPRESSION: 1. Moderate to large burden of pulmonary emboli in all lobes including a saddle embolus with no evidence of heart strain. 2. Small hiatal hernia with possible associated wall thickening in the distal esophagus. Recommend upper GI or direct visualization for better evaluation. Findings called to Dr. Corky Downs. Electronically Signed   By: Dorise Bullion III M.D   On: 03/03/2016 17:37   US Venous Img Lower Bilateral  Result Date: 03/04/2016 CLINICAL DATA:  Left lower extremity pain. History of prior DVT and pulmonary embolism. Former smoker. History of mild cancer. Evaluate for DVT. EXAM: BILATERAL LOWER EXTREMITY VENOUS DOPPLER ULTRASOUND TECHNIQUE: Gray-scale sonography with graded compression, as well as color Doppler and duplex ultrasound were performed to evaluate the lower extremity deep venous  systems from the level of the common femoral vein and including the common femoral, femoral, profunda femoral, popliteal and calf veins including the posterior tibial, peroneal and gastrocnemius veins when visible. The superficial great saphenous vein was also interrogated. Spectral Doppler was utilized to evaluate flow at rest and with distal augmentation maneuvers in the common femoral, femoral and popliteal veins. COMPARISON:  None. FINDINGS: RIGHT LOWER EXTREMITY Common Femoral Vein: No evidence of thrombus. Normal compressibility, respiratory phasicity and response to augmentation. Saphenofemoral Junction: No evidence of thrombus. Normal compressibility and flow on color Doppler imaging. Profunda Femoral Vein: No evidence of thrombus. Normal compressibility and flow on color Doppler imaging. Femoral Vein: There is largely hypoechoic expansile occlusive thrombus within the distal aspect of the right femoral vein (images 20 and 21). Popliteal Vein: No evidence of thrombus. Normal compressibility, respiratory phasicity and response to augmentation. Calf Veins: No evidence of thrombus. Normal compressibility and flow on color Doppler imaging. Superficial Great Saphenous Vein: No evidence of thrombus. Normal compressibility and flow on color Doppler imaging. Venous Reflux:  None. Other Findings:  None. LEFT LOWER EXTREMITY Common Femoral Vein: No evidence of thrombus. Normal compressibility, respiratory phasicity and response to augmentation. Saphenofemoral Junction: No evidence of thrombus. Normal compressibility and flow on color Doppler imaging. Profunda Femoral Vein: No evidence of thrombus. Normal compressibility and flow on color Doppler imaging. Femoral Vein: No evidence of thrombus. Normal compressibility, respiratory phasicity and response to augmentation. Popliteal Vein: No evidence of thrombus. Normal compressibility, respiratory phasicity and response to augmentation. Calf Veins: No evidence of thrombus.  Normal compressibility and flow on color Doppler imaging. Superficial Great Saphenous Vein: No evidence of thrombus. Normal compressibility and flow on  color Doppler imaging. Venous Reflux:  None. Other Findings:  None. IMPRESSION: 1. The examination is positive for age-indeterminate short-segment occlusive DVT within distal aspect of the right femoral vein. 2. No evidence of acute or chronic DVT within the left lower extremity. Electronically Signed   By: Sandi Mariscal M.D.   On: 03/04/2016 10:18   Dg Chest Port 1 View  Result Date: 03/03/2016 CLINICAL DATA:  Near syncope. Chest pressure like pain, diaphoresis and paleness. No known cardiopulmonary abnormality. History of Parkinson's disease and acute renal failure. EXAM: PORTABLE CHEST 1 VIEW COMPARISON:  None in PACs FINDINGS: The lungs are mildly hyperinflated but clear. The heart and pulmonary vascularity are normal. The mediastinum is normal in width. There is calcification within the wall of the thoracic aorta. There is no pleural effusion or pneumothorax. The observed bony thorax exhibits no acute abnormality. IMPRESSION: Mild hyperinflation may be voluntary or may reflect underlying reactive airway disease or COPD. There is no pneumonia, CHF, nor other acute cardiopulmonary abnormality. Thoracic aortic atherosclerosis. Electronically Signed   By: David  Martinique M.D.   On: 03/03/2016 15:45     ASSESSMENT AND PLAN:    * Bilateral pulmonary emboli, large with acute hypoxic respiratory failure No right heart strain on CT scan. Lower extremity venous Dopplers showed an occlusive right femoral vein DVT. No extensive DVTs. No need for IVC filter. Change heparin drip to Eliquis. Wean oxygen as tolerated. Check echocardiogram for right ventricular strain. Discharge tomorrow after ambulation if off oxygen and echo results available.  Ordered hypercoagulable panel. Will need referral to the [facility] for outpatient follow-up regarding unprovoked  DVT/PE.     Hypertension - stable, continue home meds    Parkinson's disease ([place]) - continue home medications    Benign prostatic hyperplasia - continue home meds    Hypothyroid - home dose thyroid replacement  All the records are reviewed and case discussed with Care Management/Social Workerr. Management plans discussed with the patient, family and they are in agreement.  CODE STATUS: FULL CODE  DVT Prophylaxis: SCDs  TOTAL TIME TAKING CARE OF THIS PATIENT: 35 minutes.   POSSIBLE D/C IN 1-2 DAYS, DEPENDING ON CLINICAL CONDITION.  Hillary Bow R M.D on 03/04/2016 at 2:51 PM  Between 7am to 6pm - Pager - [phone]  After 6pm go to [email or web address] - password [email or web address] [facility]  Office  [phone]  CC: Primary care physician; Viviana Simpler, MD  Note: This dictation was prepared with Dragon dictation along with smaller phrase technology. Any transcriptional errors that result from this process are unintentional.

## 2016-03-04 NOTE — Consult Note (Signed)
ANTICOAGULATION CONSULT NOTE - FOLLOW UP   Pharmacy Consult for heparin Indication: pulmonary embolus  Allergies  Allergen Reactions  . Quinolones     tendonitis    Patient Measurements: Height: 5\' 10"  (177.8 cm) Weight: 176 lb 14.4 oz (80.2 kg) IBW/kg (Calculated) : 73 Heparin Dosing Weight: 81.6kg  Vital Signs: Temp: 98.3 F (36.8 C) (01/05 1130) Temp Source: Oral (01/05 1130) BP: 147/75 (01/05 1130) Pulse Rate: 78 (01/05 1130)  Labs:  Recent Labs  03/03/16 1456  03/03/16 1537 03/03/16 2230 03/04/16 0306 03/04/16 1040  HGB 15.7  --   --   --  14.3  --   HCT 46.8  --   --   --  42.1  --   PLT 174  --   --   --  165  --   APTT  --   --  32  --   --   --   LABPROT  --   --  13.6  --   --   --   INR  --   --  1.04  --   --   --   HEPARINUNFRC  --   --   --   --  0.44 0.49  CREATININE  --   --  1.63*  --  1.42*  --   TROPONINI  --   < > 0.04* 0.06* 0.04* 0.03*  < > = values in this interval not displayed.  Estimated Creatinine Clearance: 40.7 mL/min (by C-G formula based on SCr of 1.42 mg/dL (H)).   Medications:  Scheduled:  . amLODipine  5 mg Oral Daily  . carbidopa-levodopa  1 tablet Oral TID  . levothyroxine  112 mcg Oral QAC breakfast    Assessment: Pt is a 83 year old male who presents with an episode of chest pain and SOB. Pt found to have saddle embolus in his lungs. Pt does have a hx of DVT, provoked by knee replacement, however does not appear to be on home anticoagulation at this time. Pharmacy consulted to dose heparin drip. Baseline labs ordered  Goal of Therapy:  Heparin level 0.3-0.7 units/ml Monitor platelets by anticoagulation protocol: Yes   Plan:  Give 4100 units bolus x 1 Start heparin infusion at 1300 units/hr Check anti-Xa level in 8 hours and daily while on heparin Continue to monitor H&H and platelets   01/05 2nd Heparin level therapeutic. Will recheck HL and CBC with am labs.   Larene Beach, PharmD  Clinical  Pharmacist  03/04/2016,11:52 AM

## 2016-03-05 DIAGNOSIS — R778 Other specified abnormalities of plasma proteins: Secondary | ICD-10-CM

## 2016-03-05 DIAGNOSIS — I82411 Acute embolism and thrombosis of right femoral vein: Secondary | ICD-10-CM

## 2016-03-05 DIAGNOSIS — N183 Chronic kidney disease, stage 3 unspecified: Secondary | ICD-10-CM

## 2016-03-05 DIAGNOSIS — R7989 Other specified abnormal findings of blood chemistry: Secondary | ICD-10-CM

## 2016-03-05 HISTORY — DX: Chronic kidney disease, stage 3 unspecified: N18.30

## 2016-03-05 LAB — CBC
HEMATOCRIT: 41.5 % (ref 40.0–52.0)
HEMOGLOBIN: 14.2 g/dL (ref 13.0–18.0)
MCH: 30.7 pg (ref 26.0–34.0)
MCHC: 34.3 g/dL (ref 32.0–36.0)
MCV: 89.7 fL (ref 80.0–100.0)
Platelets: 159 10*3/uL (ref 150–440)
RBC: 4.63 MIL/uL (ref 4.40–5.90)
RDW: 13.6 % (ref 11.5–14.5)
WBC: 6.9 10*3/uL (ref 3.8–10.6)

## 2016-03-05 LAB — PROTEIN C ACTIVITY: PROTEIN C ACTIVITY: 81 % (ref 73–180)

## 2016-03-05 LAB — ECHOCARDIOGRAM COMPLETE
Height: 70 in
WEIGHTICAEL: 2830.4 [oz_av]

## 2016-03-05 LAB — CARDIOLIPIN ANTIBODIES, IGG, IGM, IGA
Anticardiolipin IgG: <9 GPL U/mL (ref 0–14)
Anticardiolipin IgM: 10 MPL U/mL (ref 0–12)

## 2016-03-05 LAB — PROTEIN S ACTIVITY: PROTEIN S ACTIVITY: 110 % (ref 63–140)

## 2016-03-05 LAB — PROTEIN S, TOTAL: Protein S Ag, Total: 115 % (ref 60–150)

## 2016-03-05 MED ORDER — APIXABAN 5 MG PO TABS: 10.0000 mg | ORAL_TABLET | Freq: Two times a day (BID) | ORAL | 6 refills | Status: DC

## 2016-03-05 NOTE — Care Management Note (Signed)
Case Management Note  Patient Details  Name: Walter [name] MRN: [id number] Date of Birth: 05/03/1931  Subjective/Objective:     Discussed discharge planning with Mr and [name]. Their son will transport Mr Doctor home this afternoon. [name] requests that a RW be shipped to their home because they have a neighbors RW to use. A referral for home health PT and RN was called to Pamala Hurry who confirmed that [facility] is in network with [facility]. A request for a RW to be shipped to the home was faxed to [facility].             Action/Plan:   Expected Discharge Date:                  Expected Discharge Plan:     In-House Referral:     Discharge planning Services     Post Acute Care Choice:    Choice offered to:     DME Arranged:    DME Agency:     HH Arranged:    HH Agency:     Status of Service:     If discussed at [employer] of Stay Meetings, dates discussed:    Additional Comments:  Debbie Yearick A, RN 03/05/2016, 1:43 PM

## 2016-03-05 NOTE — Discharge Instructions (Signed)
Information on my medicine - ELIQUIS (apixaban)  This medication education was reviewed with me or my healthcare representative as part of my discharge preparation.  The pharmacist that spoke with me during my hospital stay was:  Cheri Guppy, [facility]  Why was Eliquis prescribed for you? Eliquis was prescribed to treat blood clots that may have been found in the veins of your legs (deep vein thrombosis) or in your lungs (pulmonary embolism) and to reduce the risk of them occurring again.  What do You need to know about Eliquis ? The starting dose is 10 mg (two 5 mg tablets) taken TWICE daily for the FIRST SEVEN (7) DAYS, then on (enter date)  January 12th  the dose is reduced to ONE 5 mg tablet taken TWICE daily.  Eliquis may be taken with or without food.   Try to take the dose about the same time in the morning and in the evening. If you have difficulty swallowing the tablet whole please discuss with your pharmacist how to take the medication safely.  Take Eliquis exactly as prescribed and DO NOT stop taking Eliquis without talking to the doctor who prescribed the medication.  Stopping may increase your risk of developing a new blood clot.  Refill your prescription before you run out.  After discharge, you should have regular check-up appointments with your healthcare provider that is prescribing your Eliquis.    What do you do if you miss a dose? If a dose of ELIQUIS is not taken at the scheduled time, take it as soon as possible on the same day and twice-daily administration should be resumed. The dose should not be doubled to make up for a missed dose.  Important Safety Information A possible side effect of Eliquis is bleeding. You should call your healthcare provider right away if you experience any of the following: ? Bleeding from an injury or your nose that does not stop. ? Unusual colored urine (red or dark brown) or unusual colored stools (red or black). ? Unusual bruising  for unknown reasons. ? A serious fall or if you hit your head (even if there is no bleeding).  Some medicines may interact with Eliquis and might increase your risk of bleeding or clotting while on Eliquis. To help avoid this, consult your healthcare provider or pharmacist prior to using any new prescription or non-prescription medications, including herbals, vitamins, non-steroidal anti-inflammatory drugs (NSAIDs) and supplements.  This website has more information on Eliquis (apixaban): http://www.eliquis.com/eliquis/home

## 2016-03-05 NOTE — Progress Notes (Signed)
Patient is discharge home in a stable condition, summary and f/u care given to both pt and family, verbalized understanding , left with son

## 2016-03-05 NOTE — Discharge Summary (Signed)
[facility] NAME: Walter [name]    MR#:  [id number]  DATE OF BIRTH:  03-23-1931  DATE OF ADMISSION:  03/03/2016 ADMITTING PHYSICIAN: Lance Coon, MD  DATE OF DISCHARGE: 03/05/2016  3:37 PM  PRIMARY CARE PHYSICIAN: Viviana Simpler, MD     ADMISSION DIAGNOSIS:  Acute saddle pulmonary embolism without acute cor pulmonale (HCC) [I26.92]  DISCHARGE DIAGNOSIS:  Principal Problem:   Pulmonary emboli (HCC) Active Problems:   Right femoral vein DVT (HCC)   Elevated troponin   Hypertension   Benign prostatic hyperplasia   Hypothyroid   Parkinson's disease (HCC)   CKD (chronic kidney disease) stage 3, GFR 30-59 ml/min   SECONDARY DIAGNOSIS:   Past Medical History:  Diagnosis Date  . ARF (acute renal failure) ([place]) 2011   after TKR  . BPH (benign prostatic hypertrophy)   . DVT (deep venous thrombosis) ([place]) 1998   after left knee arthroscopy  . Hypertension   . Hypothyroid 2007   postop from thyroidectomy (cancer scare)  . Kidney stones    recurrent  . Oral cancer ([place]) 2004   graft from left thigh  . Osteoarthritis of both knees   . Parkinson's disease ([place])   . Tendonitis 2012   from quinolone  . Toe amputation status ([place]) 2010    .pro HOSPITAL COURSE:   The patient is a 83 year old Caucasian male with past medical history significant for history of kidney stones, oral cancer, also arthritis, Parkinson's disease, tendinitis, toe amputation, DVT, BPH, acute renal failure after total knee replacement, hypertension, kidney stones, who presents to the hospital with complaints of chest pain and shortness of breath, which was described as acute read on arrival to emergency room, he was noted to have saddle embolus in his lung on CT scan and was admitted. Hypercoagulable workup was taken, pending. Patient was initiated on heparin intravenously and later switched Eliquis. He had Doppler ultrasound of lower extremities  revealing age-indeterminate short-segment occlusive DVT within distal aspect of the right femoral vein, no evidence of acute or chronic DVT within the left lower extremity. The patient was weaned off oxygen and his oxygenation remained stable. He was felt to be stable to be discharged home. Discussion by problem: #1. Bilateral pulmonary emboli and acute hypoxic respiratory failure, patient was weaned off oxygen now on room air. He had no ride her heart strain and CT scan, lower extremity Dopplers showed occlusive right femoral vein DVT, but not extensive DVT, patient is to continue therapy with Eliquis, no indications for IVC filter. The patient is to follow.-up with oncologist for further recommendations, hypercoagulable workup is pending. Echocardiogram was normal #2. Essential hypertension, stable, continue outpatient medications. #3. Parkinson's disease, continue outpatient medications, no changes #4. BPH, continue outpatient medications #5. Hypothyroidism, continue home dose of thyroid replacement therapy DISCHARGE CONDITIONS:   Stable  CONSULTS OBTAINED:    DRUG ALLERGIES:   Allergies  Allergen Reactions  . Quinolones     tendonitis    DISCHARGE MEDICATIONS:   Discharge Medication List as of 03/05/2016  3:00 PM    START taking these medications   Details  apixaban (ELIQUIS) 5 MG TABS tablet Take 2 tablets (10 mg total) by mouth 2 (two) times daily., Starting Sat 03/05/2016, Normal      CONTINUE these medications which have NOT CHANGED   Details  amLODipine (NORVASC) 5 MG tablet TAKE 1 TABLET EVERY DAY, Normal    carbidopa-levodopa (SINEMET IR) 25-100 MG tablet TAKE  1 TABLET THREE TIMES DAILY, Normal    Cholecalciferol (VITAMIN D) 2000 UNITS CAPS Take by mouth daily., Historical Med    docusate sodium (COLACE) 100 MG capsule Take 100 mg by mouth 2 (two) times daily as needed. , Historical Med    levothyroxine (SYNTHROID, LEVOTHROID) 112 MCG tablet TAKE 1 TABLET EVERY DAY  BEFORE BREAKFAST, Normal    polyethylene glycol (MIRALAX / GLYCOLAX) packet Take 17 g by mouth daily as needed. , Historical Med    vitamin B-12 (CYANOCOBALAMIN) 1000 MCG tablet Take 1,000 mcg by mouth daily., Historical Med         DISCHARGE INSTRUCTIONS:    The patient is to follow-up with primary care physician, oncologist as outpatient  If you experience worsening of your admission symptoms, develop shortness of breath, life threatening emergency, suicidal or homicidal thoughts you must seek medical attention immediately by calling 911 or calling your MD immediately  if symptoms less severe.  You Must read complete instructions/literature along with all the possible adverse reactions/side effects for all the Medicines you take and that have been prescribed to you. Take any new Medicines after you have completely understood and accept all the possible adverse reactions/side effects.   Please note  You were cared for by a hospitalist during your hospital stay. If you have any questions about your discharge medications or the care you received while you were in the hospital after you are discharged, you can call the unit and asked to speak with the hospitalist on call if the hospitalist that took care of you is not available. Once you are discharged, your primary care physician will handle any further medical issues. Please note that NO REFILLS for any discharge medications will be authorized once you are discharged, as it is imperative that you return to your primary care physician (or establish a relationship with a primary care physician if you do not have one) for your aftercare needs so that they can reassess your need for medications and monitor your lab values.    Today   CHIEF COMPLAINT:   Chief Complaint  Patient presents with  . Near Syncope    HISTORY OF PRESENT ILLNESS:  Walter [name]  is a 83 y.o. male with a known history of Prior DVT in the remote past, Parkinson's  disease, essential hypertension, hypothyroidism,kidney stones, oral cancer, also arthritis, Parkinson's disease, tendinitis, toe amputation, DVT, BPH, acute renal failure after total knee replacement, hypertension, kidney stones, who presents to the hospital with complaints of chest pain and shortness of breath, which was described as acute read on arrival to emergency room, he was noted to have saddle embolus in his lung on CT scan and was admitted. Hypercoagulable workup was taken, pending. Patient was initiated on heparin intravenously and later switched Eliquis. He had Doppler ultrasound of lower extremities revealing age-indeterminate short-segment occlusive DVT within distal aspect of the right femoral vein, no evidence of acute or chronic DVT within the left lower extremity. The patient was weaned off oxygen and his oxygenation remained stable. He was felt to be stable to be discharged home. Discussion by problem: #1. Bilateral pulmonary emboli and acute hypoxic respiratory failure, patient was weaned off oxygen now on room air. He had no ride her heart strain and CT scan, lower extremity Dopplers showed occlusive right femoral vein DVT, but not extensive DVT, patient is to continue therapy with Eliquis, no indications for IVC filter. The patient is to follow.-up with oncologist for further recommendations, hypercoagulable workup is  pending. Echocardiogram was normal #2. Essential hypertension, stable, continue outpatient medications. #3. Parkinson's disease, continue outpatient medications, no changes #4. BPH, continue outpatient medications #5. Hypothyroidism, continue home dose of thyroid replacement therapy    VITAL SIGNS:  Blood pressure 124/71, pulse 76, temperature 97.9 F (36.6 C), temperature source Oral, resp. rate 18, height 5\' 10"  (1.778 m), weight 80.2 kg (176 lb 14.4 oz), SpO2 94 %.  I/O:   Intake/Output Summary (Last 24 hours) at 03/05/16 1649 Last data filed at 03/05/16 0943   Gross per 24 hour  Intake              240 ml  Output              425 ml  Net             -185 ml    PHYSICAL EXAMINATION:  GENERAL:  83 y.o.-year-old patient lying in the bed with no acute distress.  EYES: Pupils equal, round, reactive to light and accommodation. No scleral icterus. Extraocular muscles intact.  HEENT: Head atraumatic, normocephalic. Oropharynx and nasopharynx clear.  NECK:  Supple, no jugular venous distention. No thyroid enlargement, no tenderness.  LUNGS: Normal breath sounds bilaterally, no wheezing, rales,rhonchi or crepitation. No use of accessory muscles of respiration.  CARDIOVASCULAR: S1, S2 normal. No murmurs, rubs, or gallops.  ABDOMEN: Soft, non-tender, non-distended. Bowel sounds present. No organomegaly or mass.  EXTREMITIES: No pedal edema, cyanosis, or clubbing.  NEUROLOGIC: Cranial nerves II through XII are intact. Muscle strength 5/5 in all extremities. Sensation intact. Gait not checked.  PSYCHIATRIC: The patient is alert and oriented x 3.  SKIN: No obvious rash, lesion, or ulcer.   DATA REVIEW:   CBC  Recent Labs Lab 03/05/16 0531  WBC 6.9  HGB 14.2  HCT 41.5  PLT 159    Chemistries   Recent Labs Lab 03/03/16 1537 03/04/16 0306  NA 141 142  K 4.1 3.9  CL 105 108  CO2 28 29  GLUCOSE 102* 103*  BUN 18 16  CREATININE 1.63* 1.42*  CALCIUM 9.2 8.4*  AST 20  --   ALT <5*  --   ALKPHOS 89  --   BILITOT 1.5*  --     Cardiac Enzymes  Recent Labs Lab 03/04/16 1040  TROPONINI 0.03*    Microbiology Results  Results for orders placed or performed during the hospital encounter of 03/03/16  MRSA PCR Screening     Status: None   Collection Time: 03/03/16 10:36 PM  Result Value Ref Range Status   MRSA by PCR NEGATIVE NEGATIVE Final    Comment:        The GeneXpert MRSA Assay (FDA approved for NASAL specimens only), is one component of a comprehensive MRSA colonization surveillance program. It is not intended to diagnose  MRSA infection nor to guide or monitor treatment for MRSA infections.     RADIOLOGY:  Ct Angio Chest Pe W And/or Wo Contrast  Result Date: 03/03/2016 CLINICAL DATA:  Chest pain and shortness of breath. EXAM: CT ANGIOGRAPHY CHEST WITH CONTRAST TECHNIQUE: Multidetector CT imaging of the chest was performed using the standard protocol during bolus administration of intravenous contrast. Multiplanar CT image reconstructions and MIPs were obtained to evaluate the vascular anatomy. CONTRAST:  60 mL of Isovue 370 COMPARISON:  Chest x-ray from earlier today FINDINGS: Cardiovascular: Bilateral pulmonary emboli are identified. There is a saddle embolus as seen on series 4, image 51. Emboli extend into all lobes including the upper lobes  bilaterally, the right middle lobe, and the bilateral lower lobes. There is a moderate to large burden of pulmonary emboli. The maximum right atrial diameter is 3.7 cm on image 77 and the left ventricular diameter is 4.9 cm on series 4, image 77 with no evidence of heart strain. Coronary artery calcifications are identified. The thoracic aorta is normal in caliber with atherosclerosis identified. No dissection is seen. Identified Mediastinum/Nodes: There is a small hiatal hernia with possible thickening of the distal esophagus as seen on series 4, image 91. The trachea is unremarkable. There is a mildly prominent node as seen to the right of the aorta on series 4, image 98, nonspecific but possibly reactive. No other evidence of adenopathy. Lungs/Pleura: Mild atelectasis in the left base. No suspicious infiltrate. The central airways are normal. No pneumothorax. No suspicious pulmonary nodules or masses. Upper Abdomen:  The upper abdomen is unremarkable. Musculoskeletal: No chest wall abnormality. No acute or significant osseous findings. Review of the MIP images confirms the above findings. IMPRESSION: 1. Moderate to large burden of pulmonary emboli in all lobes including a saddle  embolus with no evidence of heart strain. 2. Small hiatal hernia with possible associated wall thickening in the distal esophagus. Recommend upper GI or direct visualization for better evaluation. Findings called to Dr. Corky Downs. Electronically Signed   By: Dorise Bullion III M.D   On: 03/03/2016 17:37   US Venous Img Lower Bilateral  Result Date: 03/04/2016 CLINICAL DATA:  Left lower extremity pain. History of prior DVT and pulmonary embolism. Former smoker. History of mild cancer. Evaluate for DVT. EXAM: BILATERAL LOWER EXTREMITY VENOUS DOPPLER ULTRASOUND TECHNIQUE: Gray-scale sonography with graded compression, as well as color Doppler and duplex ultrasound were performed to evaluate the lower extremity deep venous systems from the level of the common femoral vein and including the common femoral, femoral, profunda femoral, popliteal and calf veins including the posterior tibial, peroneal and gastrocnemius veins when visible. The superficial great saphenous vein was also interrogated. Spectral Doppler was utilized to evaluate flow at rest and with distal augmentation maneuvers in the common femoral, femoral and popliteal veins. COMPARISON:  None. FINDINGS: RIGHT LOWER EXTREMITY Common Femoral Vein: No evidence of thrombus. Normal compressibility, respiratory phasicity and response to augmentation. Saphenofemoral Junction: No evidence of thrombus. Normal compressibility and flow on color Doppler imaging. Profunda Femoral Vein: No evidence of thrombus. Normal compressibility and flow on color Doppler imaging. Femoral Vein: There is largely hypoechoic expansile occlusive thrombus within the distal aspect of the right femoral vein (images 20 and 21). Popliteal Vein: No evidence of thrombus. Normal compressibility, respiratory phasicity and response to augmentation. Calf Veins: No evidence of thrombus. Normal compressibility and flow on color Doppler imaging. Superficial Great Saphenous Vein: No evidence of  thrombus. Normal compressibility and flow on color Doppler imaging. Venous Reflux:  None. Other Findings:  None. LEFT LOWER EXTREMITY Common Femoral Vein: No evidence of thrombus. Normal compressibility, respiratory phasicity and response to augmentation. Saphenofemoral Junction: No evidence of thrombus. Normal compressibility and flow on color Doppler imaging. Profunda Femoral Vein: No evidence of thrombus. Normal compressibility and flow on color Doppler imaging. Femoral Vein: No evidence of thrombus. Normal compressibility, respiratory phasicity and response to augmentation. Popliteal Vein: No evidence of thrombus. Normal compressibility, respiratory phasicity and response to augmentation. Calf Veins: No evidence of thrombus. Normal compressibility and flow on color Doppler imaging. Superficial Great Saphenous Vein: No evidence of thrombus. Normal compressibility and flow on color Doppler imaging. Venous Reflux:  None. Other  Findings:  None. IMPRESSION: 1. The examination is positive for age-indeterminate short-segment occlusive DVT within distal aspect of the right femoral vein. 2. No evidence of acute or chronic DVT within the left lower extremity. Electronically Signed   By: Sandi Mariscal M.D.   On: 03/04/2016 10:18    EKG:   Orders placed or performed during the hospital encounter of 03/03/16  . EKG 12-Lead  . EKG 12-Lead      Management plans discussed with the patient, family and they are in agreement.  CODE STATUS:     Code Status Orders        Start     Ordered   03/03/16 2224  Full code  Continuous     03/03/16 2223    Code Status History    Date Active Date Inactive Code Status Order ID Comments User Context   This patient has a current code status but no historical code status.    Advance Directive Documentation   Flowsheet Row Most Recent Value  Type of Advance Directive  Healthcare Power of Attorney  Pre-existing out of facility DNR order (yellow form or pink MOST form)   No data  "MOST" Form in Place?  No data      TOTAL TIME TAKING CARE OF THIS PATIENT: 40 minutes.    Theodoro Grist M.D on 03/05/2016 at 4:49 PM  Between 7am to 6pm - Pager - [phone]  After 6pm go to [email or web address] - password [email or web address] [facility]  Office  [phone]  CC: Primary care physician; Viviana Simpler, MD

## 2016-03-06 LAB — PROTEIN C, TOTAL: PROTEIN C, TOTAL: 59 % — AB (ref 60–150)

## 2016-03-07 ENCOUNTER — Telehealth: Payer: Self-pay

## 2016-03-07 LAB — LUPUS ANTICOAGULANT PANEL
DRVVT: 41.8 s (ref 0.0–47.0)
PTT Lupus Anticoagulant: 57.2 s — ABNORMAL HIGH (ref 0.0–51.9)

## 2016-03-07 LAB — BETA-2-GLYCOPROTEIN I ABS, IGG/M/A
Beta-2-Glycoprotein I IgA: <9 GPI IgA units (ref 0–25)
Beta-2-Glycoprotein I IgM: <9 GPI IgM units (ref 0–32)

## 2016-03-07 LAB — PTT-LA MIX: PTT-LA Mix: 50.3 s — ABNORMAL HIGH (ref 0.0–48.9)

## 2016-03-07 LAB — HEXAGONAL PHASE PHOSPHOLIPID: Hexagonal Phase Phospholipid: 8 s (ref 0–11)

## 2016-03-07 LAB — FACTOR 5 LEIDEN

## 2016-03-07 NOTE — Telephone Encounter (Signed)
I spoke with Mrs Walter [name]; pt recently in hospital for pulmonary embolism. Pt was advised to contact PCP for f/u appt. Mrs Weister scheduled 03/10/15 at 9:15 with Dr Silvio Pate; Juluis Rainier to Dr Silvio Pate.

## 2016-03-07 NOTE — Telephone Encounter (Signed)
Okay Reviewed his discharge summary He is on eliquis for PE/DVT Minor abnormalities only in hypercoaguability profile

## 2016-03-08 ENCOUNTER — Ambulatory Visit: Payer: Commercial Managed Care - HMO | Admitting: Neurology

## 2016-03-08 LAB — HOMOCYSTEINE: Homocysteine: 20.5 umol/L — ABNORMAL HIGH (ref 0.0–15.0)

## 2016-03-08 LAB — PROTHROMBIN GENE MUTATION

## 2016-03-09 ENCOUNTER — Ambulatory Visit (INDEPENDENT_AMBULATORY_CARE_PROVIDER_SITE_OTHER): Payer: Medicare HMO | Admitting: Internal Medicine

## 2016-03-09 ENCOUNTER — Encounter: Payer: Self-pay | Admitting: Internal Medicine

## 2016-03-09 VITALS — BP 106/76 | HR 73 | Temp 97.6°F | Wt 181.0 lb

## 2016-03-09 DIAGNOSIS — I2692 Saddle embolus of pulmonary artery without acute cor pulmonale: Secondary | ICD-10-CM | POA: Diagnosis not present

## 2016-03-09 DIAGNOSIS — I82411 Acute embolism and thrombosis of right femoral vein: Secondary | ICD-10-CM

## 2016-03-09 NOTE — Progress Notes (Signed)
[facility]  Patient Status: Not taken under care, patient and wife determined patient did not need [place] services. Marshell Garfinkel, CM made aware.    Walter [name] 03/09/2016, 9:42 AM

## 2016-03-09 NOTE — Assessment & Plan Note (Signed)
Better now Had DVT probably from recent travel--then got PE Reviewed hospital records Symptoms gone Echo reassuring Minor abnormalities noted in coag work-up---will see Dr Mike Gip to decide on length of treatment (this was provoked so might just need 3 months). If stopping then, need to check with Dr Mike Gip about prophylactic regimen before future travel. Will give letter ----can't travel as planned in March

## 2016-03-09 NOTE — Progress Notes (Signed)
[facility]  Patient Status: Not taken under care, patient and wife both stated they did not need [facility] services for patient. Notified Marshell Garfinkel, [place] 03/09/2016, 9:52 AM

## 2016-03-09 NOTE — Assessment & Plan Note (Signed)
No signs on exam now

## 2016-03-09 NOTE — Progress Notes (Signed)
Subjective:    Patient ID: Walter [name], male    DOB: [birth year], 83 y.o.   MRN: [id number]  HPI Here for follow up of hospitalization  Has been hit with several medical issues in past months The diarrhea thing set him back--this finally resolved  Sudden pain in chest and heaviness along with SOB Activated [place] system--then to hospital Diagnosed with PE and DVT Echo was fine  Feels better now No chest heaviness or SOB On the apixaban 10 bid --then goes down to 5mg  bid next week Has appt with Dr Deatra James review whether he needs long term Rx (only minor abnormalities in the coagulation profile)  Has trip planned in March--will have to postpone He did have trip to [place] in [place] may have been the cause of the DVT Did have DVT after arthroscopy 1990  Current Outpatient Prescriptions on File Prior to Visit  Medication Sig Dispense Refill  . amLODipine (NORVASC) 5 MG tablet TAKE 1 TABLET EVERY DAY 90 tablet 3  . apixaban (ELIQUIS) 5 MG TABS tablet Take 2 tablets (10 mg total) by mouth 2 (two) times daily. 88 tablet 6  . carbidopa-levodopa (SINEMET IR) 25-100 MG tablet TAKE 1 TABLET THREE TIMES DAILY 270 tablet 0  . Cholecalciferol (VITAMIN D) 2000 UNITS CAPS Take by mouth daily.    Marland Kitchen levothyroxine (SYNTHROID, LEVOTHROID) 112 MCG tablet TAKE 1 TABLET EVERY DAY BEFORE BREAKFAST 90 tablet 0  . polyethylene glycol (MIRALAX / GLYCOLAX) packet Take 17 g by mouth daily as needed.     . vitamin B-12 (CYANOCOBALAMIN) 1000 MCG tablet Take 1,000 mcg by mouth daily.     No current facility-administered medications on file prior to visit.     Allergies  Allergen Reactions  . Quinolones     tendonitis    Past Medical History:  Diagnosis Date  . ARF (acute renal failure) ([place]) 2011   after TKR  . BPH (benign prostatic hypertrophy)   . DVT (deep venous thrombosis) ([place]) 1998   after left knee arthroscopy  . Hypertension   . Hypothyroid 2007   postop from  thyroidectomy (cancer scare)  . Kidney stones    recurrent  . Oral cancer ([place]) 2004   graft from left thigh  . Osteoarthritis of both knees   . Parkinson's disease ([place])   . Tendonitis 2012   from quinolone  . Toe amputation status ([place]) 2010    Past Surgical History:  Procedure Laterality Date  . APPENDECTOMY    . CHOLECYSTECTOMY    . HERNIA REPAIR    . LITHOTRIPSY  2009/2010   then stent and cystoscopic removal 2014  . Oral cancer removed Right 2004  . REPAIR ZENKER'S DIVERTICULA  2013  . THYROIDECTOMY  2007  . TOE AMPUTATION Right 2010   badly overlapping other toe  . TOTAL KNEE ARTHROPLASTY Left   . TRANSURETHRAL RESECTION OF PROSTATE  2011  . UMBILICAL HERNIA REPAIR  2007    Family History  Problem Relation Age of Onset  . Dementia Mother   . Stroke Father   . Pulmonary disease Brother   . Heart disease Neg Hx   . Diabetes Neg Hx     Social History   Social History  . Marital status: Married    Spouse name: N/A  . Number of children: 3  . Years of education: N/A   Occupational History  . 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 Smoking status: Former Smoker    Types: Cigarettes    Quit date: 04/27/2002  . Smokeless tobacco: Never Used  . Alcohol use 0.0 oz/week     Comment: one drink daily (wine or scotch)  . Drug use: No  . Sexual activity: Not on file   Other Topics Concern  . Not on file   Social History Narrative   Son works for [employer]   1 daughter in [place], other in [place]      Has living will   Wife, then son, would be health care POA   He isn't sure about DNR--will accept resuscitation for now   No tube feeds if cognitively unaware   Review of Systems Appetite is good Sleeping okay--stable nocturia (x 3) Parkinson's seems stable--has seen Dr Tat    Objective:   Physical Exam  Constitutional: He appears well-nourished. No distress.  Neck: Normal range of  motion. Neck supple. No thyromegaly present.  Cardiovascular: Normal rate, regular rhythm, normal heart sounds and intact distal pulses.  Exam reveals no gallop.   No murmur heard. Pulmonary/Chest: Effort normal and breath sounds normal. No respiratory distress. He has no wheezes. He has no rales.  Musculoskeletal: He exhibits no edema.  Lymphadenopathy:    He has no cervical adenopathy.  Neurological:  Mild stiffness and bradykinesia  Psychiatric: He has a normal mood and affect. His behavior is normal.          Assessment & Plan:

## 2016-03-09 NOTE — Progress Notes (Signed)
Pre visit review using our clinic review tool, if applicable. No additional management support is needed unless otherwise documented below in the visit note. 

## 2016-03-11 ENCOUNTER — Emergency Department: Payer: Medicare HMO

## 2016-03-11 ENCOUNTER — Emergency Department
Admission: EM | Admit: 2016-03-11 | Discharge: 2016-03-11 | Disposition: A | Discharge status: Home / Self Care | Payer: Medicare HMO | Attending: Emergency Medicine | Admitting: Emergency Medicine

## 2016-03-11 DIAGNOSIS — N183 Chronic kidney disease, stage 3 (moderate): Secondary | ICD-10-CM | POA: Diagnosis not present

## 2016-03-11 DIAGNOSIS — Z85818 Personal history of malignant neoplasm of other sites of lip, oral cavity, and pharynx: Secondary | ICD-10-CM | POA: Diagnosis not present

## 2016-03-11 DIAGNOSIS — E039 Hypothyroidism, unspecified: Secondary | ICD-10-CM | POA: Insufficient documentation

## 2016-03-11 DIAGNOSIS — G20 Parkinson's disease: Secondary | ICD-10-CM | POA: Diagnosis not present

## 2016-03-11 DIAGNOSIS — I129 Hypertensive chronic kidney disease with stage 1 through stage 4 chronic kidney disease, or unspecified chronic kidney disease: Secondary | ICD-10-CM | POA: Diagnosis not present

## 2016-03-11 DIAGNOSIS — N200 Calculus of kidney: Secondary | ICD-10-CM | POA: Diagnosis not present

## 2016-03-11 DIAGNOSIS — Z79899 Other long term (current) drug therapy: Secondary | ICD-10-CM | POA: Diagnosis not present

## 2016-03-11 DIAGNOSIS — R319 Hematuria, unspecified: Secondary | ICD-10-CM | POA: Insufficient documentation

## 2016-03-11 DIAGNOSIS — Z87891 Personal history of nicotine dependence: Secondary | ICD-10-CM | POA: Diagnosis not present

## 2016-03-11 LAB — COMPREHENSIVE METABOLIC PANEL
ALBUMIN: 4.2 g/dL (ref 3.5–5.0)
ALT: 5 U/L — ABNORMAL LOW (ref 17–63)
ANION GAP: 6 (ref 5–15)
AST: 21 U/L (ref 15–41)
Alkaline Phosphatase: 89 U/L (ref 38–126)
BILIRUBIN TOTAL: 1.1 mg/dL (ref 0.3–1.2)
BUN: 19 mg/dL (ref 6–20)
CO2: 28 mmol/L (ref 22–32)
Calcium: 9.1 mg/dL (ref 8.9–10.3)
Chloride: 106 mmol/L (ref 101–111)
Creatinine, Ser: 1.34 mg/dL — ABNORMAL HIGH (ref 0.61–1.24)
GFR, EST AFRICAN AMERICAN: 55 mL/min — AB (ref >60)
GFR, EST NON AFRICAN AMERICAN: 47 mL/min — AB (ref >60)
Glucose, Bld: 99 mg/dL (ref 65–99)
POTASSIUM: 3.9 mmol/L (ref 3.5–5.1)
Sodium: 140 mmol/L (ref 135–145)
TOTAL PROTEIN: 7.5 g/dL (ref 6.5–8.1)

## 2016-03-11 LAB — CBC WITH DIFFERENTIAL/PLATELET
BASOS PCT: 1 %
Basophils Absolute: 0 10*3/uL (ref 0–0.1)
Eosinophils Absolute: 0.1 10*3/uL (ref 0–0.7)
Eosinophils Relative: 1 %
HEMATOCRIT: 43.8 % (ref 40.0–52.0)
Hemoglobin: 15.2 g/dL (ref 13.0–18.0)
LYMPHS ABS: 1.1 10*3/uL (ref 1.0–3.6)
Lymphocytes Relative: 18 %
MCH: 30.8 pg (ref 26.0–34.0)
MCHC: 34.7 g/dL (ref 32.0–36.0)
MCV: 88.8 fL (ref 80.0–100.0)
MONO ABS: 0.7 10*3/uL (ref 0.2–1.0)
MONOS PCT: 11 %
NEUTROS ABS: 4.1 10*3/uL (ref 1.4–6.5)
Neutrophils Relative %: 69 %
Platelets: 261 10*3/uL (ref 150–440)
RBC: 4.92 MIL/uL (ref 4.40–5.90)
RDW: 13.6 % (ref 11.5–14.5)
WBC: 6 10*3/uL (ref 3.8–10.6)

## 2016-03-11 LAB — URINALYSIS, COMPLETE (UACMP) WITH MICROSCOPIC
BILIRUBIN URINE: NEGATIVE
Bacteria, UA: NONE SEEN
GLUCOSE, UA: NEGATIVE mg/dL
KETONES UR: 5 mg/dL — AB
NITRITE: NEGATIVE
PH: 6 (ref 5.0–8.0)
Protein, ur: 30 mg/dL — AB
SPECIFIC GRAVITY, URINE: 1.013 (ref 1.005–1.030)

## 2016-03-11 NOTE — ED Triage Notes (Signed)
Pt came to ED via poc c/o blood in urine. Repots noticed when he woke up this morning. Denies pain. Reports recently started on blood thinner. Alert and oriented x4.

## 2016-03-11 NOTE — Discharge Instructions (Signed)
Please hold the dosage of Eliquis this evening. Continue with your lower dose of Eliquis tomorrow . Please continue all of your other medications. Contact your primary physician later today to let them know about your emergency department visit. Possible future referral to urology if the hematuria continues.  Return to emergency department especially for abdominal pain, fever, trouble urinating, large amount of blood especially with clots, or any other new concerns

## 2016-03-11 NOTE — ED Provider Notes (Signed)
Time Seen: Approximately *1004  I have reviewed the triage notes  Chief Complaint: Hematuria   History of Present Illness: Walter [name] is a 83 y.o. male who presents with recent onset of hematuria. Patient states he's urinated 3 times since last night with maroon colored urine dark in appearance without any clots. If the blood is mixed in with the entire urinary sequence or is at the beginning of the end of urination. He denies any pain. Patient was recently placed on Eliquist for outpatient treatment of a pulmonary embolism. He states he's able to urinate completely and denies any urinary urgency, melena, hematochezia.   Past Medical History:  Diagnosis Date  . ARF (acute renal failure) ([place]) 2011   after TKR  . BPH (benign prostatic hypertrophy)   . DVT (deep venous thrombosis) ([place]) 1998   after left knee arthroscopy  . Hypertension   . Hypothyroid 2007   postop from thyroidectomy (cancer scare)  . Kidney stones    recurrent  . Oral cancer ([place]) 2004   graft from left thigh  . Osteoarthritis of both knees   . Parkinson's disease ([place])   . Tendonitis 2012   from quinolone  . Toe amputation status ([place]) 2010    Patient Active Problem List   Diagnosis Date Noted  . Right femoral vein DVT ([place]) 03/05/2016  . Elevated troponin 03/05/2016  . CKD (chronic kidney disease) stage 3, GFR 30-59 ml/min 03/05/2016  . Pulmonary emboli ([place]) 03/03/2016  . Vitamin B12 deficiency 09/09/2014  . Preventative health care 08/04/2014  . Parkinson's disease ([place]) 08/04/2014  . Esophageal diverticulum 01/01/2014  . Actinic keratosis 07/31/2013  . Hypertension   . Osteoarthritis of both knees   . Toe amputation status ([place])   . Benign prostatic hyperplasia   . Kidney stones   . Hypothyroid   . Tendonitis     Past Surgical History:  Procedure Laterality Date  . APPENDECTOMY    . CHOLECYSTECTOMY    . HERNIA REPAIR    . LITHOTRIPSY  2009/2010   then stent and cystoscopic removal  2014  . Oral cancer removed Right 2004  . REPAIR ZENKER'S DIVERTICULA  2013  . THYROIDECTOMY  2007  . TOE AMPUTATION Right 2010   badly overlapping other toe  . TOTAL KNEE ARTHROPLASTY Left   . TRANSURETHRAL RESECTION OF PROSTATE  2011  . UMBILICAL HERNIA REPAIR  2007    Past Surgical History:  Procedure Laterality Date  . APPENDECTOMY    . CHOLECYSTECTOMY    . HERNIA REPAIR    . LITHOTRIPSY  2009/2010   then stent and cystoscopic removal 2014  . Oral cancer removed Right 2004  . REPAIR ZENKER'S DIVERTICULA  2013  . THYROIDECTOMY  2007  . TOE AMPUTATION Right 2010   badly overlapping other toe  . TOTAL KNEE ARTHROPLASTY Left   . TRANSURETHRAL RESECTION OF PROSTATE  2011  . UMBILICAL HERNIA REPAIR  2007    Current Outpatient Rx  . Order #: [id number] Class: Normal  . Order #: [id number] Class: Normal  . Order #: [id number] Class: Normal  . Order #: [id number] Class: Historical Med  . Order #: [id number] Class: Normal  . Order #: [id number] Class: Historical Med  . Order #: [id number] Class: Historical Med    Allergies:  Quinolones  Family History: Family History  Problem Relation Age of Onset  . Dementia Mother   . Stroke Father   . Pulmonary disease Brother   . Heart disease Neg Hx   . Diabetes Neg Hx  Social History: Social History  Substance Use Topics  . Smoking status: Former Smoker    Types: Cigarettes    Quit date: 04/27/2002  . Smokeless tobacco: Never Used  . Alcohol use 0.0 oz/week     Comment: one drink daily (wine or scotch)     Review of Systems:   10 point review of systems was performed and was otherwise negative:  Constitutional: No fever Eyes: No visual disturbances ENT: No sore throat, ear pain Cardiac: No chest pain Respiratory: No shortness of breath, wheezing, or stridor Abdomen: No abdominal pain, no vomiting, No diarrhea Endocrine: No weight loss, No night sweats Extremities: No peripheral edema, cyanosis Skin: No rashes, easy  bruising Neurologic: No focal weakness, trouble with speech or swollowing Urologic: Hematuria with urinary frequency    Physical Exam:  ED Triage Vitals [03/11/16 0944]  Enc Vitals Group     BP (!) 142/64     Pulse Rate 62     Resp 16     Temp 98 F (36.7 C)     Temp Source Oral     SpO2 96 %     Weight 190 lb (86.2 kg)     Height 5\' 10"  (1.778 m)     Head Circumference      Peak Flow      Pain Score      Pain Loc      Pain Edu?      Excl. in [place]?     General: Awake , Alert , and Oriented times 3; GCS 15 Head: Normal cephalic , atraumatic Eyes: Pupils equal , round, reactive to light Nose/Throat: No nasal drainage, patent upper airway without erythema or exudate.  Neck: Supple, Full range of motion, No anterior adenopathy or palpable thyroid masses Lungs: Clear to ascultation without wheezes , rhonchi, or rales Heart: Regular rate, regular rhythm without murmurs , gallops , or rubs Abdomen: Soft, non tender without rebound, guarding , or rigidity; bowel sounds positive and symmetric in all 4 quadrants. No organomegaly .        Extremities: 2 plus symmetric pulses. No edema, clubbing or cyanosis Neurologic: normal ambulation, Motor symmetric without deficits, sensory intact Skin: warm, dry, no rashes   Labs:   All laboratory work was reviewed including any pertinent negatives or positives listed below:  Labs Reviewed  CBC WITH DIFFERENTIAL/PLATELET  COMPREHENSIVE METABOLIC PANEL  URINALYSIS, COMPLETE (UACMP) WITH MICROSCOPIC  Patient has hematuria but otherwise no other significant findings   Radiology: * "Ct Angio Chest Pe W And/or Wo Contrast  Result Date: 03/03/2016 CLINICAL DATA:  Chest pain and shortness of breath. EXAM: CT ANGIOGRAPHY CHEST WITH CONTRAST TECHNIQUE: Multidetector CT imaging of the chest was performed using the standard protocol during bolus administration of intravenous contrast. Multiplanar CT image reconstructions and MIPs were obtained to  evaluate the vascular anatomy. CONTRAST:  60 mL of Isovue 370 COMPARISON:  Chest x-ray from earlier today FINDINGS: Cardiovascular: Bilateral pulmonary emboli are identified. There is a saddle embolus as seen on series 4, image 51. Emboli extend into all lobes including the upper lobes bilaterally, the right middle lobe, and the bilateral lower lobes. There is a moderate to large burden of pulmonary emboli. The maximum right atrial diameter is 3.7 cm on image 77 and the left ventricular diameter is 4.9 cm on series 4, image 77 with no evidence of heart strain. Coronary artery calcifications are identified. The thoracic aorta is normal in caliber with atherosclerosis identified. No dissection is seen.  Identified Mediastinum/Nodes: There is a small hiatal hernia with possible thickening of the distal esophagus as seen on series 4, image 91. The trachea is unremarkable. There is a mildly prominent node as seen to the right of the aorta on series 4, image 98, nonspecific but possibly reactive. No other evidence of adenopathy. Lungs/Pleura: Mild atelectasis in the left base. No suspicious infiltrate. The central airways are normal. No pneumothorax. No suspicious pulmonary nodules or masses. Upper Abdomen:  The upper abdomen is unremarkable. Musculoskeletal: No chest wall abnormality. No acute or significant osseous findings. Review of the MIP images confirms the above findings. IMPRESSION: 1. Moderate to large burden of pulmonary emboli in all lobes including a saddle embolus with no evidence of heart strain. 2. Small hiatal hernia with possible associated wall thickening in the distal esophagus. Recommend upper GI or direct visualization for better evaluation. Findings called to Dr. Corky Downs. Electronically Signed   By: Dorise Bullion III M.D   On: 03/03/2016 17:37   US Venous Img Lower Bilateral  Result Date: 03/04/2016 CLINICAL DATA:  Left lower extremity pain. History of prior DVT and pulmonary embolism. Former  smoker. History of mild cancer. Evaluate for DVT. EXAM: BILATERAL LOWER EXTREMITY VENOUS DOPPLER ULTRASOUND TECHNIQUE: Gray-scale sonography with graded compression, as well as color Doppler and duplex ultrasound were performed to evaluate the lower extremity deep venous systems from the level of the common femoral vein and including the common femoral, femoral, profunda femoral, popliteal and calf veins including the posterior tibial, peroneal and gastrocnemius veins when visible. The superficial great saphenous vein was also interrogated. Spectral Doppler was utilized to evaluate flow at rest and with distal augmentation maneuvers in the common femoral, femoral and popliteal veins. COMPARISON:  None. FINDINGS: RIGHT LOWER EXTREMITY Common Femoral Vein: No evidence of thrombus. Normal compressibility, respiratory phasicity and response to augmentation. Saphenofemoral Junction: No evidence of thrombus. Normal compressibility and flow on color Doppler imaging. Profunda Femoral Vein: No evidence of thrombus. Normal compressibility and flow on color Doppler imaging. Femoral Vein: There is largely hypoechoic expansile occlusive thrombus within the distal aspect of the right femoral vein (images 20 and 21). Popliteal Vein: No evidence of thrombus. Normal compressibility, respiratory phasicity and response to augmentation. Calf Veins: No evidence of thrombus. Normal compressibility and flow on color Doppler imaging. Superficial Great Saphenous Vein: No evidence of thrombus. Normal compressibility and flow on color Doppler imaging. Venous Reflux:  None. Other Findings:  None. LEFT LOWER EXTREMITY Common Femoral Vein: No evidence of thrombus. Normal compressibility, respiratory phasicity and response to augmentation. Saphenofemoral Junction: No evidence of thrombus. Normal compressibility and flow on color Doppler imaging. Profunda Femoral Vein: No evidence of thrombus. Normal compressibility and flow on color Doppler  imaging. Femoral Vein: No evidence of thrombus. Normal compressibility, respiratory phasicity and response to augmentation. Popliteal Vein: No evidence of thrombus. Normal compressibility, respiratory phasicity and response to augmentation. Calf Veins: No evidence of thrombus. Normal compressibility and flow on color Doppler imaging. Superficial Great Saphenous Vein: No evidence of thrombus. Normal compressibility and flow on color Doppler imaging. Venous Reflux:  None. Other Findings:  None. IMPRESSION: 1. The examination is positive for age-indeterminate short-segment occlusive DVT within distal aspect of the right femoral vein. 2. No evidence of acute or chronic DVT within the left lower extremity. Electronically Signed   By: Sandi Mariscal M.D.   On: 03/04/2016 10:18   Dg Chest Port 1 View  Result Date: 03/03/2016 CLINICAL DATA:  Near syncope. Chest pressure like pain,  diaphoresis and paleness. No known cardiopulmonary abnormality. History of Parkinson's disease and acute renal failure. EXAM: PORTABLE CHEST 1 VIEW COMPARISON:  None in PACs FINDINGS: The lungs are mildly hyperinflated but clear. The heart and pulmonary vascularity are normal. The mediastinum is normal in width. There is calcification within the wall of the thoracic aorta. There is no pleural effusion or pneumothorax. The observed bony thorax exhibits no acute abnormality. IMPRESSION: Mild hyperinflation may be voluntary or may reflect underlying reactive airway disease or COPD. There is no pneumonia, CHF, nor other acute cardiopulmonary abnormality. Thoracic aortic atherosclerosis. Electronically Signed   By: David  Martinique M.D.   On: 03/03/2016 15:45   Ct Renal Stone Study  Result Date: 03/11/2016 CLINICAL DATA:  Gross hematuria. EXAM: CT ABDOMEN AND PELVIS WITHOUT CONTRAST TECHNIQUE: Multidetector CT imaging of the abdomen and pelvis was performed following the standard protocol without IV contrast. COMPARISON:  None. FINDINGS: Lower chest:  Linear areas of scarring in lung bases. Heart is mildly enlarged. Trace pericardial effusion. No pleural effusions. Hepatobiliary: No focal liver abnormality is seen. Status post cholecystectomy. No biliary dilatation. Pancreas: No focal abnormality or ductal dilatation. Spleen: No focal abnormality.  Normal size. Adrenals/Urinary Tract: Numerous bilateral nonobstructing renal stones. No ureteral stones or hydronephrosis. Urinary bladder is decompressed, grossly unremarkable. Large right renal cyst measures 7.6 cm. This cannot be fully characterized without intravenous contrast. Adrenal glands are unremarkable. Stomach/Bowel: Stomach, large and small bowel grossly unremarkable. Vascular/Lymphatic: Mild dilatation of the abdominal aorta proximally measuring 3.5 cm just below the renal artery use. Diffuse aortic atherosclerosis. No adenopathy. Reproductive: Scattered prostate calcifications. Prostate is mildly prominent. Other: No free fluid or free air. Left inguinal hernia containing fat. Musculoskeletal: Diffuse degenerative disc and facet disease in the lumbar spine. No acute or focal bony abnormality. IMPRESSION: Numerous small bilateral nonobstructing renal stones. No hydronephrosis. Large 7.6 cm right renal cyst which cannot be fully characterized without intravenous contrast. Mild aneurysmal dilatation of the mid abdominal aorta, 3.5 cm just below the renal arteries. Diffuse aortic atherosclerosis. Left inguinal hernia containing fat. Electronically Signed   By: Rolm Baptise M.D.   On: 03/11/2016 11:03  "    I personally reviewed the radiologic studies   ED Course:  I felt the patient could be treated on an outpatient basis and he was advised to hold his evening dose of Eliquis. He was advised to continue all his current medications and restart the Eliquis tomorrow as previously prescribed. He is currently able to urinate without any difficulty and was advised to return here if he develops a fever,  abdominal pain, large bright red clots, etc. Not appear to have any masses or abnormality in the urinary collection system and the patient's bleeding will likely resolve on its on. Patient's currently on the blood thinner for pulmonary embolism. Clinical Course      Assessment: Hematuria   Final Clinical Impression:   Final diagnoses:  Hematuria     Plan: * Outpatient Patient was advised to return immediately if condition worsens. Patient was advised to follow up with their primary care physician or other specialized physicians involved in their outpatient care. The patient and/or family member/power of attorney had laboratory results reviewed at the bedside. All questions and concerns were addressed and appropriate discharge instructions were distributed by the nursing staff.            Daymon Larsen, MD 03/11/16 1248

## 2016-03-15 ENCOUNTER — Telehealth: Payer: Self-pay

## 2016-03-15 ENCOUNTER — Other Ambulatory Visit: Payer: Self-pay | Admitting: Internal Medicine

## 2016-03-15 NOTE — Telephone Encounter (Signed)
Spoke to pt. He said he was doing OK. Said he had a few episodes of hematuria. It has gotten better. He appreciated the call

## 2016-03-21 ENCOUNTER — Inpatient Hospital Stay: Payer: Medicare HMO

## 2016-03-21 ENCOUNTER — Encounter: Payer: Self-pay | Admitting: Hematology and Oncology

## 2016-03-21 ENCOUNTER — Inpatient Hospital Stay: Payer: Medicare HMO | Attending: Hematology and Oncology | Admitting: Hematology and Oncology

## 2016-03-21 VITALS — BP 132/83 | HR 76 | Temp 96.1°F | Resp 18 | Ht 70.0 in | Wt 184.7 lb

## 2016-03-21 DIAGNOSIS — Z86718 Personal history of other venous thrombosis and embolism: Secondary | ICD-10-CM | POA: Insufficient documentation

## 2016-03-21 DIAGNOSIS — Z79899 Other long term (current) drug therapy: Secondary | ICD-10-CM

## 2016-03-21 DIAGNOSIS — Z87442 Personal history of urinary calculi: Secondary | ICD-10-CM

## 2016-03-21 DIAGNOSIS — E039 Hypothyroidism, unspecified: Secondary | ICD-10-CM

## 2016-03-21 DIAGNOSIS — Z85819 Personal history of malignant neoplasm of unspecified site of lip, oral cavity, and pharynx: Secondary | ICD-10-CM | POA: Insufficient documentation

## 2016-03-21 DIAGNOSIS — Z87891 Personal history of nicotine dependence: Secondary | ICD-10-CM

## 2016-03-21 DIAGNOSIS — J00 Acute nasopharyngitis [common cold]: Secondary | ICD-10-CM | POA: Diagnosis not present

## 2016-03-21 DIAGNOSIS — Z7901 Long term (current) use of anticoagulants: Secondary | ICD-10-CM | POA: Diagnosis not present

## 2016-03-21 DIAGNOSIS — Z9049 Acquired absence of other specified parts of digestive tract: Secondary | ICD-10-CM | POA: Diagnosis not present

## 2016-03-21 DIAGNOSIS — M170 Bilateral primary osteoarthritis of knee: Secondary | ICD-10-CM | POA: Insufficient documentation

## 2016-03-21 DIAGNOSIS — R1310 Dysphagia, unspecified: Secondary | ICD-10-CM | POA: Diagnosis not present

## 2016-03-21 DIAGNOSIS — K225 Diverticulum of esophagus, acquired: Secondary | ICD-10-CM | POA: Insufficient documentation

## 2016-03-21 DIAGNOSIS — I10 Essential (primary) hypertension: Secondary | ICD-10-CM | POA: Diagnosis not present

## 2016-03-21 DIAGNOSIS — K228 Other specified diseases of esophagus: Secondary | ICD-10-CM

## 2016-03-21 DIAGNOSIS — R634 Abnormal weight loss: Secondary | ICD-10-CM | POA: Insufficient documentation

## 2016-03-21 DIAGNOSIS — I2692 Saddle embolus of pulmonary artery without acute cor pulmonale: Secondary | ICD-10-CM | POA: Diagnosis not present

## 2016-03-21 DIAGNOSIS — G20 Parkinson's disease: Secondary | ICD-10-CM | POA: Insufficient documentation

## 2016-03-21 DIAGNOSIS — K449 Diaphragmatic hernia without obstruction or gangrene: Secondary | ICD-10-CM | POA: Diagnosis not present

## 2016-03-21 DIAGNOSIS — M779 Enthesopathy, unspecified: Secondary | ICD-10-CM | POA: Diagnosis not present

## 2016-03-21 DIAGNOSIS — I824Z1 Acute embolism and thrombosis of unspecified deep veins of right distal lower extremity: Secondary | ICD-10-CM

## 2016-03-21 DIAGNOSIS — N400 Enlarged prostate without lower urinary tract symptoms: Secondary | ICD-10-CM | POA: Diagnosis not present

## 2016-03-21 DIAGNOSIS — K2289 Other specified disease of esophagus: Secondary | ICD-10-CM

## 2016-03-21 DIAGNOSIS — I82411 Acute embolism and thrombosis of right femoral vein: Secondary | ICD-10-CM

## 2016-03-21 LAB — COMPREHENSIVE METABOLIC PANEL
ALT: <5 U/L — ABNORMAL LOW (ref 17–63)
AST: 18 U/L (ref 15–41)
Albumin: 4.5 g/dL (ref 3.5–5.0)
Alkaline Phosphatase: 74 U/L (ref 38–126)
Anion gap: 7 (ref 5–15)
BUN: 22 mg/dL — ABNORMAL HIGH (ref 6–20)
CO2: 30 mmol/L (ref 22–32)
Calcium: 8.9 mg/dL (ref 8.9–10.3)
Chloride: 105 mmol/L (ref 101–111)
Creatinine, Ser: 1.44 mg/dL — ABNORMAL HIGH (ref 0.61–1.24)
GFR calc Af Amer: 50 mL/min — ABNORMAL LOW (ref >60)
GFR calc non Af Amer: 43 mL/min — ABNORMAL LOW (ref >60)
Glucose, Bld: 103 mg/dL — ABNORMAL HIGH (ref 65–99)
Potassium: 4.2 mmol/L (ref 3.5–5.1)
Sodium: 142 mmol/L (ref 135–145)
Total Bilirubin: 0.8 mg/dL (ref 0.3–1.2)
Total Protein: 7.4 g/dL (ref 6.5–8.1)

## 2016-03-21 LAB — PSA: PSA: 3.56 ng/mL (ref 0.00–4.00)

## 2016-03-21 MED ORDER — APIXABAN 5 MG PO TABS: 5.0000 mg | ORAL_TABLET | Freq: Two times a day (BID) | ORAL | 0 refills | Status: DC

## 2016-03-21 NOTE — Progress Notes (Signed)
Patient here today as new evaluation regarding PE (bilateral).  Patient accompanied by his wife.  Patient also has Parkinson's.

## 2016-03-21 NOTE — Progress Notes (Signed)
[facility] day:  03/21/2016  Chief Complaint: Walter [name] is a 83 y.o. male with a saddle pulmonary embolism and lower extremity DVT who is referred by Dr. Theodoro Grist for assessment and management.  HPI:  The patient notes a history of left lower extremity DVT in 1990.  He was hospitalized for 9 days.  He was on Coumadin for < 1 year.  He previously smoked 2-3 cigars/day for 30-40 years.  He stopped smoking in 2004.  Prior to recent events, he was on a 2 1/2 hour flight from [place] to [place] on 02/09/2016.   He denied any subsequent immobilization or dehydration.  The patient was admitted to [facility] from 03/03/2016 - 03/05/2016.  He presented with chest pain and shortness of breath.  Chest CT angiogram on 03/03/2016 revealed a moderate to large burden of pulmonary emboli in all lobes including a saddle embolus with no evidence of heart strain.  There was a small hiatal hernia with possible associated wall thickening in the distal esophagus.  He was started on Eliquis.  Bilateral lower extremity duplex on 03/04/2016 revealed an age-indeterminate short-segment occlusive DVT within distal aspect of the right femoral vein.  Hypercoagulable work-up on 03/04/2016 included the following normal studies: CBC, factor V Leiden, prothrombin gene mutation, lupus anticoagulant panel, anti-cardiolipin antibodies, beta-2 glycoprotein, protein S total (115%), protein S activity (110%), protein C activity (81%), AT III activity (84%).  Protein C total was 59% (60-150%).  Homocysteine level was 20.5 (0-15).  Creatinine was 1.63 (CrCl 43 ml/min).  Bilirubin was 1.5.  He notes a history of oral cancer s/p resection of his inner cheek at the [facility] in 2004.  He had a graft from his inner leg.  Symptomatically, he notes weight loss of 30-40 pounds in the past 2-3 years.  He previously weighed 220 pounds.  He describes choking easily.  He has a Zenker's  diverticulum.  He has had a barium upper GI study.  He is due for a colonoscopy.  He denies any melena or hematochezia.  He had blood in his urine 1 week ago.  He has a history of nephrolithiasis.  He had a cold for 1-2 weeks.  There is no family history of thrombosis.  His father had a CVA in his 79s.  There is no family history of early pregnancy loss.  His brother died of CHF.   Past Medical History:  Diagnosis Date  . ARF (acute renal failure) ([place]) 2011   after TKR  . BPH (benign prostatic hypertrophy)   . DVT (deep venous thrombosis) ([place]) 1998   after left knee arthroscopy  . Hx of colonoscopy 2000  . Hypertension   . Hypothyroid 2007   postop from thyroidectomy (cancer scare)  . Kidney stones    recurrent  . Oral cancer ([place]) 2004   graft from left thigh  . Osteoarthritis of both knees   . Parkinson's disease ([place])   . Tendonitis 2012   from quinolone  . Toe amputation status ([place]) 2010    Past Surgical History:  Procedure Laterality Date  . APPENDECTOMY    . CHOLECYSTECTOMY    . HERNIA REPAIR    . LITHOTRIPSY  2009/2010   then stent and cystoscopic removal 2014  . Oral cancer removed Right 2004  . REPAIR ZENKER'S DIVERTICULA  2013  . THYROIDECTOMY  2007  . TOE AMPUTATION Right 2010   badly overlapping other toe  . TOTAL KNEE ARTHROPLASTY  Left   . TRANSURETHRAL RESECTION OF PROSTATE  2011  . UMBILICAL HERNIA REPAIR  2007    Family History  Problem Relation Age of Onset  . Dementia Mother   . Stroke Father   . Pulmonary disease Brother   . Heart disease Neg Hx   . Diabetes Neg Hx     Social History:  reports that he quit smoking about 13 years ago. His smoking use included Cigarettes. He has never used smokeless tobacco. He reports that he drinks alcohol. He reports that he does not use drugs.  He previously smoked 2-3 cigars/day for 30-40 years.  He stopped smoking in 2004.  He previously lived in [place].  He lives in [place].  They moved to  [address].  He and his wife met in junior high school.  They have been married 13 years.  The patient is accompanied by his wife, [name], today.  Allergies:  Allergies  Allergen Reactions  . Quinolones     tendonitis    Current Medications: Current Outpatient Prescriptions  Medication Sig Dispense Refill  . amLODipine (NORVASC) 5 MG tablet TAKE 1 TABLET EVERY DAY 90 tablet 3  . apixaban (ELIQUIS) 5 MG TABS tablet Take 2 tablets (10 mg total) by mouth 2 (two) times daily. 88 tablet 6  . carbidopa-levodopa (SINEMET IR) 25-100 MG tablet TAKE 1 TABLET THREE TIMES DAILY 270 tablet 2  . Cholecalciferol (VITAMIN D) 2000 UNITS CAPS Take by mouth daily.    Marland Kitchen levothyroxine (SYNTHROID, LEVOTHROID) 112 MCG tablet TAKE 1 TABLET EVERY DAY BEFORE BREAKFAST 90 tablet 2  . polyethylene glycol (MIRALAX / GLYCOLAX) packet Take 17 g by mouth daily as needed.     . vitamin B-12 (CYANOCOBALAMIN) 1000 MCG tablet Take 1,000 mcg by mouth daily.     No current facility-administered medications for this visit.     Review of Systems:  GENERAL:  Feels good.  Active.  No fevers or sweats.  Weight loss of 30-40 pounds in 2-3 years.Marland Kitchen PERFORMANCE STATUS (ECOG):  1 HEENT:  No visual changes, runny nose, sore throat, mouth sores or tenderness. Lungs: No shortness of breath or cough.  No hemoptysis. Cardiac:  No chest pain, palpitations, orthopnea, or PND. GI:  Chokes easily.  No nausea, vomiting, diarrhea, constipation, melena or hematochezia. GU:  No urgency, frequency, dysuria.  Hematuria with a h/o nephrolithiasis. Musculoskeletal:  No back pain.  No joint pain.  No muscle tenderness. Extremities:  No pain or swelling. Skin:  No rashes or skin changes. Neuro:  No headache, numbness or weakness, balance or coordination issues. Endocrine:  No diabetes.  Thyroid disease on Synthroid.  No hot flashes or night sweats. Psych:  No mood changes, depression or anxiety. Pain:  No focal pain. Review of systems:  All  other systems reviewed and found to be negative.  Physical Exam: Blood pressure 132/83, pulse 76, temperature (!) 96.1 F (35.6 C), temperature source Tympanic, resp. rate 18, height 5' 10" (1.778 m), weight 184 lb 11.9 oz (83.8 kg). GENERAL:  Well developed, well nourished, gentleman sitting comfortably in the exam room in no acute distress. MENTAL STATUS:  Alert and oriented to person, place and time. HEAD:  Pearline Cables hair.  Normocephalic, atraumatic, face symmetric, no Cushingoid features. EYES:  Blue eyes.  Pupils equal round and reactive to light and accomodation.  No conjunctivitis or scleral icterus. ENT:  Oropharynx clear without lesion.  Cheek graft.  Tongue normal. Mucous membranes moist.  RESPIRATORY:  Clear to auscultation  without rales, wheezes or rhonchi. CARDIOVASCULAR:  Regular rate and rhythm without murmur, rub or gallop. ABDOMEN:  Soft, non-tender, with active bowel sounds, and no hepatosplenomegaly.  No masses. SKIN:  No rashes, ulcers or lesions. EXTREMITIES: No edema, no skin discoloration or tenderness.  No palpable cords. LYMPH NODES: No palpable cervical, supraclavicular, axillary or inguinal adenopathy  NEUROLOGICAL: Unremarkable. PSYCH:  Appropriate.   No visits with results within 3 Day(s) from this visit.  Latest known visit with results is:  Admission on 03/11/2016, Discharged on 03/11/2016  Component Date Value Ref Range Status  . WBC 03/11/2016 6.0  3.8 - 10.6 K/uL Final  . RBC 03/11/2016 4.92  4.40 - 5.90 MIL/uL Final  . Hemoglobin 03/11/2016 15.2  13.0 - 18.0 g/dL Final  . HCT 03/11/2016 43.8  40.0 - 52.0 % Final  . MCV 03/11/2016 88.8  80.0 - 100.0 fL Final  . MCH 03/11/2016 30.8  26.0 - 34.0 pg Final  . MCHC 03/11/2016 34.7  32.0 - 36.0 g/dL Final  . RDW 03/11/2016 13.6  11.5 - 14.5 % Final  . Platelets 03/11/2016 261  150 - 440 K/uL Final  . Neutrophils Relative % 03/11/2016 69  % Final  . Neutro Abs 03/11/2016 4.1  1.4 - 6.5 K/uL Final  .  Lymphocytes Relative 03/11/2016 18  % Final  . Lymphs Abs 03/11/2016 1.1  1.0 - 3.6 K/uL Final  . Monocytes Relative 03/11/2016 11  % Final  . Monocytes Absolute 03/11/2016 0.7  0.2 - 1.0 K/uL Final  . Eosinophils Relative 03/11/2016 1  % Final  . Eosinophils Absolute 03/11/2016 0.1  0 - 0.7 K/uL Final  . Basophils Relative 03/11/2016 1  % Final  . Basophils Absolute 03/11/2016 0.0  0 - 0.1 K/uL Final  . Sodium 03/11/2016 140  135 - 145 mmol/L Final  . Potassium 03/11/2016 3.9  3.5 - 5.1 mmol/L Final  . Chloride 03/11/2016 106  101 - 111 mmol/L Final  . CO2 03/11/2016 28  22 - 32 mmol/L Final  . Glucose, Bld 03/11/2016 99  65 - 99 mg/dL Final  . BUN 03/11/2016 19  6 - 20 mg/dL Final  . Creatinine, Ser 03/11/2016 1.34* 0.61 - 1.24 mg/dL Final  . Calcium 03/11/2016 9.1  8.9 - 10.3 mg/dL Final  . Total Protein 03/11/2016 7.5  6.5 - 8.1 g/dL Final  . Albumin 03/11/2016 4.2  3.5 - 5.0 g/dL Final  . AST 03/11/2016 21  15 - 41 U/L Final  . ALT 03/11/2016 5* 17 - 63 U/L Final  . Alkaline Phosphatase 03/11/2016 89  38 - 126 U/L Final  . Total Bilirubin 03/11/2016 1.1  0.3 - 1.2 mg/dL Final  . GFR calc non Af Amer 03/11/2016 47* >60 mL/min Final  . GFR calc Af Amer 03/11/2016 55* >60 mL/min Final   Comment: (NOTE) The eGFR has been calculated using the CKD EPI equation. This calculation has not been validated in all clinical situations. eGFR's persistently <60 mL/min signify possible Chronic Kidney Disease.   . Anion gap 03/11/2016 6  5 - 15 Final  . Color, Urine 03/11/2016 YELLOW* YELLOW Final  . APPearance 03/11/2016 HAZY* CLEAR Final  . Specific Gravity, Urine 03/11/2016 1.013  1.005 - 1.030 Final  . pH 03/11/2016 6.0  5.0 - 8.0 Final  . Glucose, UA 03/11/2016 NEGATIVE  NEGATIVE mg/dL Final  . Hgb urine dipstick 03/11/2016 LARGE* NEGATIVE Final  . Bilirubin Urine 03/11/2016 NEGATIVE  NEGATIVE Final  . Ketones, ur 03/11/2016 5*  NEGATIVE mg/dL Final  . Protein, ur 03/11/2016 30*  NEGATIVE mg/dL Final  . Nitrite 03/11/2016 NEGATIVE  NEGATIVE Final  . Leukocytes, UA 03/11/2016 TRACE* NEGATIVE Final  . RBC / HPF 03/11/2016 TOO NUMEROUS TO COUNT  0 - 5 RBC/hpf Final  . WBC, UA 03/11/2016 6-30  0 - 5 WBC/hpf Final  . Bacteria, UA 03/11/2016 NONE SEEN  NONE SEEN Final  . Squamous Epithelial / LPF 03/11/2016 0-5* NONE SEEN Final  . Mucous 03/11/2016 PRESENT   Final    Assessment:  Keldric Poyer is a 83 y.o. male with a history of recurrent thrombosis.  He had a left lower extremity DVT in 1990 following arthroscopic surgery.  He was on Coumadin < 1 year.  He developed a pulmonary embolism on 03/03/2016 without apparent precipitating events.  Three weeks prior, he had been on a 2 1/2 hour flight.  He is on Eliquis.  Chest CT angiogram on 03/03/2016 revealed a moderate to large burden of pulmonary emboli in all lobes including a saddle embolus with no evidence of heart strain.  There was a small hiatal hernia with possible associated wall thickening in the distal esophagus.  Bilateral lower extremity duplex on 03/04/2016 revealed an age-indeterminate short-segment occlusive DVT within distal aspect of the right femoral vein.  Hypercoagulable work-up on 03/04/2016 included the following normal studies: CBC, factor V Leiden, prothrombin gene mutation, lupus anticoagulant panel, anti-cardiolipin antibodies, beta-2 glycoprotein, protein S total (115%), protein S activity (110%), protein C activity (81%), AT III activity (84%).  Protein C total was 59% (60-150%).  Homocysteine level was 20.5 (0-15).  Creatinine was 1.63 (CrCl 43 ml/min).  Bilirubin was 1.5.  He is due for a colonoscopy.  He has a history of Zenker's diverticulum.  He has a history of oral cancer s/p resection of his inner cheek at the [facility] in 2004.   He has a 2-3 year history of a 30-40 pound weight loss.  He chokes easily.  He has a history of nephrolithiasis and recent hematuria.  Plan: 1.  Review  hypercoagulable work-up.  Discuss slightly low protein C level (doubt significance).  Discuss rechecking this lab in the future.  Discuss concern for weight loss.  Discuss need for GI evaluation and eventual upper and lower endoscopy.  Discuss hematuria and referral to urology. Will likely need imaging (CTscans or PET scan) secondary to weight loss, but will need to avoid contrast given increased creatinine. 2.  Referral to gastroenterology. 3.  Follow-up with Dr. Erlene Quan- hematuria 4.  Labs today:  CMP, PSA, CA19-9, CEA. 5.  RTC after GI appointment.   Lequita Asal, MD  03/21/2016, 4:01 PM

## 2016-03-22 LAB — CANCER ANTIGEN 19-9: CA 19-9: 22 U/mL (ref 0–35)

## 2016-03-22 LAB — CEA: CEA: 4.2 ng/mL (ref 0.0–4.7)

## 2016-03-22 NOTE — Telephone Encounter (Signed)
error 

## 2016-03-23 ENCOUNTER — Telehealth: Payer: Self-pay | Admitting: *Deleted

## 2016-03-23 NOTE — Telephone Encounter (Signed)
Called pt to inform him of his appt with Dr. Mike Gip on 04-14-16 at 1000 voiced understanding.

## 2016-03-29 NOTE — Progress Notes (Signed)
Walter [name] was seen today in the movement disorders clinic for neurologic consultation at the request of Viviana Simpler, MD.  The consultation is for the evaluation of Parkinson's disease.  The patient reports he has never seen a neurologist previously.  This patient is accompanied in the office by his spouse who supplements the history.  I have had the opportunity to review records from his primary care physician regarding the diagnosis.  The patient was diagnosed with Parkinson's disease by his primary care physician in June, 2016 and placed on levodopa.  The patient indicates that after starting the medication, he felt that his symptoms were much better.  His balance was better.  Reports that [place] helped his balance as well.  Pt reports that his first sx was slowness, shuffling and balance trouble.    04/01/16 update:  Pt f/u today, accompanied by his wife who supplements the hx.  On carbidopa/levodopa 25/100 tid.  Pt denies falls.  Pt denies lightheadedness, near syncope.  No hallucinations.  Mood has been good.  The records that were made available to me were reviewed.  He had a MBE on 12/11/15 and there was moderate oropharyngeal dysphagia.  There was laryngeal penetration of nectar thick liquid.  Small zenkers diverticulum noted.  Regular diet recommended.  Asks about going to ST and that is his biggest c/o.  Speech getting quiet.  Used to enjoy singing and now can't and would like to get back to that.  MRI brain done and reviewed on 12/03/15.  Demonstrated only mod atrophy and mod small vessel disease.  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  He was d/c on 03/05/16 on eliquis.  Back on the upswing but not been able to exercise yet.    PREVIOUS MEDICATIONS: Sinemet  ALLERGIES:   Allergies  Allergen Reactions  . Quinolones     tendonitis    CURRENT MEDICATIONS:  Outpatient Encounter Prescriptions as of 03/31/2016  Medication Sig  . amLODipine (NORVASC)  5 MG tablet TAKE 1 TABLET EVERY DAY  . apixaban (ELIQUIS) 5 MG TABS tablet Take 1 tablet (5 mg total) by mouth 2 (two) times daily.  . carbidopa-levodopa (SINEMET IR) 25-100 MG tablet TAKE 1 TABLET THREE TIMES DAILY  . Cholecalciferol (VITAMIN D) 2000 UNITS CAPS Take by mouth daily.  Marland Kitchen levothyroxine (SYNTHROID, LEVOTHROID) 112 MCG tablet TAKE 1 TABLET EVERY DAY BEFORE BREAKFAST  . polyethylene glycol (MIRALAX / GLYCOLAX) packet Take 17 g by mouth daily as needed.   . vitamin B-12 (CYANOCOBALAMIN) 1000 MCG tablet Take 1,000 mcg by mouth daily.  . [DISCONTINUED] apixaban (ELIQUIS) 5 MG TABS tablet Take 2 tablets (10 mg total) by mouth 2 (two) times daily.   No facility-administered encounter medications on file as of 03/31/2016.     PAST MEDICAL HISTORY:   Past Medical History:  Diagnosis Date  . ARF (acute renal failure) ([place]) 2011   after TKR  . BPH (benign prostatic hypertrophy)   . DVT (deep venous thrombosis) ([place]) 1998   after left knee arthroscopy  . Hx of colonoscopy 2000  . Hypertension   . Hypothyroid 2007   postop from thyroidectomy (cancer scare)  . Kidney stones    recurrent  . Oral cancer ([place]) 2004   graft from left thigh  . Osteoarthritis of both knees   . Parkinson's disease ([place])   . Tendonitis 2012   from quinolone  . Toe amputation status ([place]) 2010  PAST SURGICAL HISTORY:   Past Surgical History:  Procedure Laterality Date  . APPENDECTOMY    . CHOLECYSTECTOMY    . HERNIA REPAIR    . LITHOTRIPSY  2009/2010   then stent and cystoscopic removal 2014  . Oral cancer removed Right 2004  . REPAIR ZENKER'S DIVERTICULA  2013  . THYROIDECTOMY  2007  . TOE AMPUTATION Right 2010   badly overlapping other toe  . TOTAL KNEE ARTHROPLASTY Left   . TRANSURETHRAL RESECTION OF PROSTATE  2011  . UMBILICAL HERNIA REPAIR  2007    SOCIAL HISTORY:   Social History   Social History  . Marital status: Married    Spouse name: N/A  . Number of children: 3  . Years  of education: N/A   Occupational History  . 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 Smoking status: Former Smoker    Types: Cigarettes    Quit date: 04/27/2002  . Smokeless tobacco: Never Used  . Alcohol use 0.0 oz/week     Comment: one drink daily (wine or scotch)  . Drug use: No  . Sexual activity: Not on file   Other Topics Concern  . Not on file   Social History Narrative   Son works for [employer]   1 daughter in [place], other in [place] living will   Wife, then son, would be health care POA   He isn't sure about DNR--will accept resuscitation for now   No tube feeds if cognitively unaware    FAMILY HISTORY:   Family Status  Relation Status  . Mother Deceased at age 81   old age  . Father Deceased at age 70   stroke  . Brother Deceased   unknown-- had moved away  . Child Alive  . Neg Hx     ROS:  A complete 10 system review of systems was obtained and was unremarkable apart from what is mentioned above.  PHYSICAL EXAMINATION:    VITALS:   Vitals:   03/31/16 1018  BP: 114/68  Pulse: 78  Weight: 183 lb (83 kg)  Height: _0  (1.778 m)    GEN:  The patient appears stated age and is in NAD. HEENT:  Normocephalic, atraumatic.  The mucous membranes are moist. The superficial temporal arteries are without ropiness or tenderness. CV:  RRR Lungs:  CTAB Neck/HEME:  There are no carotid bruits bilaterally.  Neurological examination:  Orientation: The patient is alert and oriented x3. Fund of knowledge is appropriate.  Recent and remote memory are intact.  Attention and concentration are normal.    Able to name objects and repeat phrases. Cranial nerves: There is good facial symmetry with facial hypomimia. Pupils are equal round and reactive to light bilaterally. Fundoscopic exam reveals clear margins bilaterally. Extraocular muscles are intact. The visual fields are full to  confrontational testing. The speech is fluent and clear. Soft palate rises symmetrically and there is no tongue deviation. Hearing is intact to conversational tone. Sensation: Sensation is intact to light and pinprick throughout (facial, trunk, extremities). Vibration is decreased at the bilateral big toe and ankle. There is no extinction with double simultaneous stimulation. There is no sensory dermatomal level identified. Motor: Strength is 5/5 in the bilateral upper and lower extremities.   Shoulder shrug is equal and symmetric.  There is no pronator drift.   Movement examination: Tone: There is normal tone in  the UE Abnormal movements: There is no tremor today Coordination:  There is no decremation, with any form of RAMS, including alternating supination and pronation of the forearm, hand opening and closing, finger taps, heel taps and toe taps. Gait and Station: The patient has no difficulty arising out of a deep-seated chair without the use of the hands and is able to do it on first attempt. The patient's stride length is decreased with decreased arm swing bilaterally.  The patient turns en bloc  Lab Results  Component Value Date   [id number] 973 (H) 08/25/2014     ASSESSMENT/PLAN:  1.  Parkinsonism.  I suspect that this does represent idiopathic Parkinson's disease but vascular parkinsonism is in the Ddx.  The patient has bradykinesia, rigidity and mild postural instability.  -We discussed the diagnosis as well as pathophysiology of the disease.  We discussed treatment options as well as prognostic indicators.  Patient education was provided.  -Continue carbidopa/levodopa 25/100 tid but move dosages closer together so that he takes them 30 min prior to meals.  Risks, benefits, side effects and alternative therapies were discussed.  The opportunity to ask questions was given and they were answered to the best of my ability.  The patient expressed understanding and willingness to follow the  outlined treatment protocols.  -send RX LSVT BIG  2.  Dysphagia  -he has a zenkers diverticulum.  He had a MBE on 12/11/15 and there was moderate oropharyngeal dysphagia.  There was laryngeal penetration of nectar thick liquid.  Small zenkers diverticulum noted.  Regular diet recommended.   -send RX LSVT LOUD  3.  B12 deficiency  -he asked if he should continue supplement and I told him he needed to continue that.  His B12 was 192 was prior to starting supplement.  4.  Follow up is anticipated in the next few months, sooner should new neurologic issues arise.  Much greater than 50% of this visit was spent in counseling and coordinating care.  Total face to face time:  30 min   Cc:  Viviana Simpler, MD

## 2016-03-31 ENCOUNTER — Encounter: Payer: Self-pay | Admitting: Neurology

## 2016-03-31 ENCOUNTER — Ambulatory Visit (INDEPENDENT_AMBULATORY_CARE_PROVIDER_SITE_OTHER): Payer: Medicare HMO | Admitting: Neurology

## 2016-03-31 VITALS — BP 114/68 | HR 78 | Ht 70.0 in | Wt 183.0 lb

## 2016-03-31 DIAGNOSIS — E538 Deficiency of other specified B group vitamins: Secondary | ICD-10-CM

## 2016-03-31 DIAGNOSIS — G20 Parkinson's disease: Secondary | ICD-10-CM | POA: Diagnosis not present

## 2016-03-31 DIAGNOSIS — R1319 Other dysphagia: Secondary | ICD-10-CM | POA: Diagnosis not present

## 2016-03-31 NOTE — Patient Instructions (Signed)
We will send a referral for Physical and speech therapy to [facility].  Call if you don't hear from them in the next 1-2 weeks  Continue B12 supplement  Continue carbidopa/levodopa 25/100 1 tablet three times a day prior to meals

## 2016-03-31 NOTE — Addendum Note (Signed)
Addended byAnnamaria Helling on: 03/31/2016 11:16 AM   Modules accepted: Orders

## 2016-04-11 ENCOUNTER — Ambulatory Visit (INDEPENDENT_AMBULATORY_CARE_PROVIDER_SITE_OTHER): Payer: Medicare HMO | Admitting: Gastroenterology

## 2016-04-11 ENCOUNTER — Encounter: Payer: Self-pay | Admitting: Gastroenterology

## 2016-04-11 VITALS — BP 120/70 | HR 76 | Ht 70.0 in | Wt 183.0 lb

## 2016-04-11 DIAGNOSIS — R933 Abnormal findings on diagnostic imaging of other parts of digestive tract: Secondary | ICD-10-CM

## 2016-04-11 NOTE — Progress Notes (Signed)
Gastroenterology Consultation  Referring Provider:     Venia Carbon, MD Primary Care Physician:  Viviana Simpler, MD Primary Gastroenterologist:  Dr. Jonathon Bellows  Reason for Consultation:     Thickening of the esophagus on CT scan         HPI:   Walter [name] is a 83 y.o. y/o male referred for consultation & management  by Dr. Viviana Simpler, MD.    Has been referred for abnormal thickening of the esophagus seen on a recent CT scan . He was admitted on 03/03/16 and discharged 2 dats later when he was admitted with acute saddle pulmonary embolism when he presented to the ER with chest pain and shortness of breath. He was commenced on Eloquis and discharged. He has seen Dr Mike Gip in the outpatient and being evaluated for hypercoaguable work up.    He denies any weight loss, difficulty swallowing. Recalls he was on a cruise and long flights prior to the episode of pulmonary embolism.            Past Medical History:  Diagnosis Date  . ARF (acute renal failure) ([place]) 2011   after TKR  . BPH (benign prostatic hypertrophy)   . DVT (deep venous thrombosis) ([place]) 1998   after left knee arthroscopy  . Hx of colonoscopy 2000  . Hypertension   . Hypothyroid 2007   postop from thyroidectomy (cancer scare)  . Kidney stones    recurrent  . Oral cancer ([place]) 2004   graft from left thigh  . Osteoarthritis of both knees   . Parkinson's disease ([place])   . Tendonitis 2012   from quinolone  . Toe amputation status ([place]) 2010    Past Surgical History:  Procedure Laterality Date  . APPENDECTOMY    . CHOLECYSTECTOMY    . HERNIA REPAIR    . LITHOTRIPSY  2009/2010   then stent and cystoscopic removal 2014  . Oral cancer removed Right 2004  . REPAIR ZENKER'S DIVERTICULA  2013  . THYROIDECTOMY  2007  . TOE AMPUTATION Right 2010   badly overlapping other toe  . TOTAL KNEE ARTHROPLASTY Left   . TRANSURETHRAL RESECTION OF PROSTATE  2011  . UMBILICAL HERNIA REPAIR  2007    Prior to  Admission medications   Medication Sig Start Date End Date Taking? Authorizing Provider  amLODipine (NORVASC) 5 MG tablet TAKE 1 TABLET EVERY DAY 10/13/15  Yes Venia Carbon, MD  apixaban (ELIQUIS) 5 MG TABS tablet Take 1 tablet (5 mg total) by mouth 2 (two) times daily. 03/21/16  Yes Lequita Asal, MD  carbidopa-levodopa (SINEMET IR) 25-100 MG tablet TAKE 1 TABLET THREE TIMES DAILY 03/15/16  Yes Venia Carbon, MD  Cholecalciferol (VITAMIN D) 2000 UNITS CAPS Take by mouth daily.   Yes Historical Provider, MD  levothyroxine (SYNTHROID, LEVOTHROID) 112 MCG tablet TAKE 1 TABLET EVERY DAY BEFORE BREAKFAST 03/15/16  Yes Venia Carbon, MD  polyethylene glycol (MIRALAX / GLYCOLAX) packet Take 17 g by mouth daily as needed.    Yes Historical Provider, MD  vitamin B-12 (CYANOCOBALAMIN) 1000 MCG tablet Take 1,000 mcg by mouth daily.   Yes Historical Provider, MD    Family History  Problem Relation Age of Onset  . Dementia Mother   . Stroke Father   . Pulmonary disease Brother   . Heart disease Neg Hx   . Diabetes Neg Hx      Social History  Substance Use Topics  . Smoking status: Former Smoker  Types: Cigarettes    Quit date: 04/27/2002  . Smokeless tobacco: Never Used  . Alcohol use 0.0 oz/week     Comment: one drink daily (wine or scotch)    Allergies as of 04/11/2016 - Review Complete 04/11/2016  Allergen Reaction Noted  . Quinolones  07/31/2013    Review of Systems:    All systems reviewed and negative except where noted in HPI.   Physical Exam:  BP 120/70   Pulse 76   Ht 5\' 10"  (1.778 m)   Wt 183 lb (83 kg)   BMI 26.26 kg/m  No LMP for male patient. Psych:  Alert and cooperative. Normal mood and affect. General:   Alert,  Well-developed, well-nourished, pleasant and cooperative in NAD Head:  Normocephalic and atraumatic. Eyes:  Sclera clear, no icterus.   Conjunctiva pink. Ears:  Normal auditory acuity. Nose:  No deformity, discharge, or lesions. Mouth:  No  deformity or lesions,oropharynx pink & moist. Neck:  Supple; no masses or thyromegaly. Lungs:  Respirations even and unlabored.  Clear throughout to auscultation.   No wheezes, crackles, or rhonchi. No acute distress. Heart:  Regular rate and rhythm; no murmurs, clicks, rubs, or gallops. Abdomen:  Normal bowel sounds.  No bruits.  Soft, non-tender and non-distended without masses, hepatosplenomegaly or hernias noted.  No guarding or rebound tenderness.    Msk:  Symmetrical without gross deformities. Good, equal movement & strength bilaterally. Pulses:  Normal pulses noted. Extremities:  No clubbing or edema.  No cyanosis. Neurologic:  Alert and oriented x3;  grossly normal neurologically. Psych:  Alert and cooperative. Normal mood and affect.  Imaging Studies: No results found.  Assessment and Plan:   Walter [name] 83 y.o. male has been referred to me for thickening of the distal esophagus seen incidentally on CT angio chest when he presented recently with shortness of breath and was diagnosed with a saddle pulmonary embolus. He is on anticoagulation. Unclear the underlying reason for pulmonary embolism ?related to long flight during holiday vs underlying hypercoagulable state. He also mentioned he has a Zenkers diverticulum . Explained the thickened esophagus on CT scan may represent a spasm or esophagitis and at times also be related to a neoplasm.   Plan   1. He would definitely need an EGD, I would like him to be evaluated by cardiology prior to any endoscopy to determine if it is safe for him to undergo an endoscopy at this time in terms of anesthesia risks or if they would suggest [place] to wait a for a few more weeks and treatment with anticoagulation   I have discussed alternative options, risks & benefits,  which include, but are not limited to, bleeding, infection, perforation,respiratory complication & drug reaction.  The patient agrees with this plan & written consent will be obtained.       Follow up in 8 weeks   Dr Jonathon Bellows MD

## 2016-04-12 ENCOUNTER — Other Ambulatory Visit: Payer: Self-pay

## 2016-04-14 ENCOUNTER — Other Ambulatory Visit: Payer: Self-pay | Admitting: Hematology and Oncology

## 2016-04-14 ENCOUNTER — Encounter: Payer: Self-pay | Admitting: Hematology and Oncology

## 2016-04-14 ENCOUNTER — Inpatient Hospital Stay: Payer: Medicare HMO | Attending: Hematology and Oncology | Admitting: Hematology and Oncology

## 2016-04-14 VITALS — BP 117/75 | HR 80 | Temp 94.6°F | Resp 18 | Wt 183.0 lb

## 2016-04-14 DIAGNOSIS — R634 Abnormal weight loss: Secondary | ICD-10-CM

## 2016-04-14 DIAGNOSIS — Z7901 Long term (current) use of anticoagulants: Secondary | ICD-10-CM | POA: Insufficient documentation

## 2016-04-14 DIAGNOSIS — M170 Bilateral primary osteoarthritis of knee: Secondary | ICD-10-CM | POA: Diagnosis not present

## 2016-04-14 DIAGNOSIS — R319 Hematuria, unspecified: Secondary | ICD-10-CM

## 2016-04-14 DIAGNOSIS — N400 Enlarged prostate without lower urinary tract symptoms: Secondary | ICD-10-CM | POA: Diagnosis not present

## 2016-04-14 DIAGNOSIS — Z79899 Other long term (current) drug therapy: Secondary | ICD-10-CM

## 2016-04-14 DIAGNOSIS — I2692 Saddle embolus of pulmonary artery without acute cor pulmonale: Secondary | ICD-10-CM | POA: Diagnosis not present

## 2016-04-14 DIAGNOSIS — E039 Hypothyroidism, unspecified: Secondary | ICD-10-CM | POA: Insufficient documentation

## 2016-04-14 DIAGNOSIS — I10 Essential (primary) hypertension: Secondary | ICD-10-CM | POA: Insufficient documentation

## 2016-04-14 DIAGNOSIS — K225 Diverticulum of esophagus, acquired: Secondary | ICD-10-CM | POA: Diagnosis not present

## 2016-04-14 DIAGNOSIS — Z86718 Personal history of other venous thrombosis and embolism: Secondary | ICD-10-CM | POA: Insufficient documentation

## 2016-04-14 DIAGNOSIS — Z87891 Personal history of nicotine dependence: Secondary | ICD-10-CM | POA: Insufficient documentation

## 2016-04-14 DIAGNOSIS — Z85819 Personal history of malignant neoplasm of unspecified site of lip, oral cavity, and pharynx: Secondary | ICD-10-CM | POA: Insufficient documentation

## 2016-04-14 DIAGNOSIS — G20 Parkinson's disease: Secondary | ICD-10-CM | POA: Diagnosis not present

## 2016-04-14 DIAGNOSIS — Z89429 Acquired absence of other toe(s), unspecified side: Secondary | ICD-10-CM | POA: Diagnosis not present

## 2016-04-14 DIAGNOSIS — Z87442 Personal history of urinary calculi: Secondary | ICD-10-CM | POA: Diagnosis not present

## 2016-04-14 DIAGNOSIS — I82411 Acute embolism and thrombosis of right femoral vein: Secondary | ICD-10-CM

## 2016-04-14 DIAGNOSIS — R1310 Dysphagia, unspecified: Secondary | ICD-10-CM | POA: Diagnosis not present

## 2016-04-14 DIAGNOSIS — Z9049 Acquired absence of other specified parts of digestive tract: Secondary | ICD-10-CM | POA: Insufficient documentation

## 2016-04-14 MED ORDER — APIXABAN 5 MG PO TABS: 5.0000 mg | ORAL_TABLET | Freq: Two times a day (BID) | ORAL | 11 refills | Status: DC

## 2016-04-14 NOTE — Progress Notes (Signed)
[facility] day:  04/14/2016  Chief Complaint: Walter [name] is a 83 y.o. male with a saddle pulmonary embolism and lower extremity DVT who is seen for reassessment after interval gastroenterology consult.  HPI:  The patient was last seen in the hematology clinic on 03/21/2016.  At that time, he was seen for initial consultation.  He was diagnosed with a saddle embolism on 03/03/2016.  He was on Eliquis.  He had a history of left lower extremity DVT in 1990.  Initial hypercoagulable work-up was unrevealing.  Concern was raised regarding a 30-40 pound weight loss in the past 2-3 years.  Labs from last visit included a normal CEA (4.2), CA19.9 (22), and PSA (3.56).  LFTs were normal.  Creatinine was 1.44.  He gave a history of easily being choked and a Zenker's diverticulum.  CT angiogram revealed possible thickening in the distal esophagus.  He was due for a colonoscopy.  He also noted hematuria and a history of nephrolithiasis.  He was referred to gastroenterology.  He was seen by Dr. Jonathon Bellows on 04/11/2016.  He is being considered for EGD.  He is awaiting cardiology clearance.  He states that he has not seen Dr. Erlene Quan in follow-up.  His wife notes "no large weight loss over the past year".  He takes small bites of his food and she was a lot. He weighed 180 pounds in on 05/06/2015. He is taking Eliquis 5 mg twice a day.  He has one week left before he runs out.  He needs a refill.   Past Medical History:  Diagnosis Date  . ARF (acute renal failure) ([facility] [facility]) 2011   after TKR  . BPH (benign prostatic hypertrophy)   . DVT (deep venous thrombosis) ([facility]) 1998   after left knee arthroscopy  . Hx of colonoscopy 2000  . Hypertension   . Hypothyroid 2007   postop from thyroidectomy (cancer scare)  . Kidney stones    recurrent  . Oral cancer ([place]) 2004   graft from left thigh  . Osteoarthritis of both knees   . Parkinson's disease ([place] [facility])   . Tendonitis  2012   from quinolone  . Toe amputation status ([facility]) 2010    Past Surgical History:  Procedure Laterality Date  . APPENDECTOMY    . CHOLECYSTECTOMY    . HERNIA REPAIR    . LITHOTRIPSY  2009/2010   then stent and cystoscopic removal 2014  . Oral cancer removed Right 2004  . REPAIR ZENKER'S DIVERTICULA  2013  . THYROIDECTOMY  2007  . TOE AMPUTATION Right 2010   badly overlapping other toe  . TOTAL KNEE ARTHROPLASTY Left   . TRANSURETHRAL RESECTION OF PROSTATE  2011  . UMBILICAL HERNIA REPAIR  2007    Family History  Problem Relation Age of Onset  . Dementia Mother   . Stroke Father   . Pulmonary disease Brother   . Heart disease Neg Hx   . Diabetes Neg Hx     Social History:  reports that he quit smoking about 13 years ago. His smoking use included Cigarettes. He has never used smokeless tobacco. He reports that he drinks alcohol. He reports that he does not use drugs.  He previously smoked 2-3 cigars/day for 30-40 years.  He stopped smoking in 2004.  He previously lived in [place].  He lives in [place].  They moved to [facility].  He and his wife met in junior high school.  They  have been married 79 years.  The patient is accompanied by his wife, [name], and son, Sherren Mocha, today.  Allergies:  Allergies  Allergen Reactions  . Quinolones     tendonitis    Current Medications: Current Outpatient Prescriptions  Medication Sig Dispense Refill  . amLODipine (NORVASC) 5 MG tablet TAKE 1 TABLET EVERY DAY 90 tablet 3  . apixaban (ELIQUIS) 5 MG TABS tablet Take 1 tablet (5 mg total) by mouth 2 (two) times daily. 60 tablet 0  . carbidopa-levodopa (SINEMET IR) 25-100 MG tablet TAKE 1 TABLET THREE TIMES DAILY 270 tablet 2  . Cholecalciferol (VITAMIN D) 2000 UNITS CAPS Take by mouth daily.    Marland Kitchen levothyroxine (SYNTHROID, LEVOTHROID) 112 MCG tablet TAKE 1 TABLET EVERY DAY BEFORE BREAKFAST 90 tablet 2  . polyethylene glycol (MIRALAX / GLYCOLAX) packet Take 17 g by mouth daily as  needed.     . vitamin B-12 (CYANOCOBALAMIN) 1000 MCG tablet Take 1,000 mcg by mouth daily.     No current facility-administered medications for this visit.     Review of Systems:  GENERAL:  Feels good.  Active.  No fevers or sweats.  Weight loss of 30-40 pounds in 2-3 years (fairly stable in past year).  Weight loss of 2 pounds since 03/21/2016. PERFORMANCE STATUS (ECOG):  1 HEENT:  No visual changes, runny nose, sore throat, mouth sores or tenderness. Lungs: No shortness of breath or cough.  No hemoptysis. Cardiac:  No chest pain, palpitations, orthopnea, or PND. GI:  Chokes easily.  Ches food well.  Takes small bites.  No nausea, vomiting, diarrhea, constipation, melena or hematochezia. GU:  No urgency, frequency, dysuria.  Hematuria with a h/o nephrolithiasis. Musculoskeletal:  No back pain.  No joint pain.  No muscle tenderness. Extremities:  No pain or swelling. Skin:  No rashes or skin changes. Neuro:  No headache, numbness or weakness, balance or coordination issues. Endocrine:  No diabetes.  Thyroid disease on Synthroid.  No hot flashes or night sweats. Psych:  No mood changes, depression or anxiety. Pain:  No focal pain. Review of systems:  All other systems reviewed and found to be negative.  Physical Exam: Blood pressure 117/75, pulse 80, temperature (!) 94.6 F (34.8 C), temperature source Tympanic, resp. rate 18, weight 182 lb 15.7 oz (83 kg). GENERAL:  Well developed, well nourished, gentleman sitting comfortably in the exam room in no acute distress. MENTAL STATUS:  Alert and oriented to person, place and time. HEAD:  Pearline Cables hair.  Normocephalic, atraumatic, face symmetric, no Cushingoid features. EYES:  Blue eyes.  No conjunctivitis or scleral icterus. NEUROLOGICAL: Unremarkable. PSYCH:  Appropriate.   No visits with results within 3 Day(s) from this visit.  Latest known visit with results is:  Office Visit on 03/21/2016  Component Date Value Ref Range Status  .  CEA 03/21/2016 4.2  0.0 - 4.7 ng/mL Final   Comment: (NOTE)       Roche ECLIA methodology       Nonsmokers  <3.9                                     Smokers     <5.6 Performed At: [facility] [place], [place] [id number] Lindon Romp MD [id number]   . PSA 03/21/2016 3.56  0.00 - 4.00 ng/mL Final   Comment: (NOTE) While PSA levels of <=4.0 ng/ml are reported as reference range,  some men with levels below 4.0 ng/ml can have prostate cancer and many men with PSA above 4.0 ng/ml do not have prostate cancer.  Other tests such as free PSA, age specific reference ranges, PSA velocity and PSA doubling time may be helpful especially in men less than 38 years old. Performed at [facility], [place] [address] [place], [place] [address]   . Sodium 03/21/2016 142  135 - 145 mmol/L Final  . Potassium 03/21/2016 4.2  3.5 - 5.1 mmol/L Final  . Chloride 03/21/2016 105  101 - 111 mmol/L Final  . CO2 03/21/2016 30  22 - 32 mmol/L Final  . Glucose, Bld 03/21/2016 103* 65 - 99 mg/dL Final  . BUN 03/21/2016 22* 6 - 20 mg/dL Final  . Creatinine, Ser 03/21/2016 1.44* 0.61 - 1.24 mg/dL Final  . Calcium 03/21/2016 8.9  8.9 - 10.3 mg/dL Final  . Total Protein 03/21/2016 7.4  6.5 - 8.1 g/dL Final  . Albumin 03/21/2016 4.5  3.5 - 5.0 g/dL Final  . AST 03/21/2016 18  15 - 41 U/L Final  . ALT 03/21/2016 <5* 17 - 63 U/L Final  . Alkaline Phosphatase 03/21/2016 74  38 - 126 U/L Final  . Total Bilirubin 03/21/2016 0.8  0.3 - 1.2 mg/dL Final  . GFR calc non Af Amer 03/21/2016 43* >60 mL/min Final  . GFR calc Af Amer 03/21/2016 50* >60 mL/min Final   Comment: (NOTE) The eGFR has been calculated using the CKD EPI equation. This calculation has not been validated in all clinical situations. eGFR's persistently <60 mL/min signify possible Chronic Kidney Disease.   . Anion gap 03/21/2016 7  5 - 15 Final  . CA 19-9 03/21/2016 22  0 - 35 U/mL Final   Comment: (NOTE) Roche ECLIA  methodology Performed At: [facility] [address] [place], [place] [id number] Lindon Romp MD HR:[id number]     Assessment:  Walter [name] is a 83 y.o. male with a history of recurrent thrombosis.  He had a left lower extremity DVT in 1990 following arthroscopic surgery.  He was on Coumadin < 1 year.  He developed a pulmonary embolism on 03/03/2016 without apparent precipitating events.  Three weeks prior, he had been on a 2 1/2 hour flight.  He is on Eliquis.  Chest CT angiogram on 03/03/2016 revealed a moderate to large burden of pulmonary emboli in all lobes including a saddle embolus with no evidence of heart strain.  There was a small hiatal hernia with possible associated wall thickening in the distal esophagus.  Bilateral lower extremity duplex on 03/04/2016 revealed an age-indeterminate short-segment occlusive DVT within distal aspect of the right femoral vein.  Hypercoagulable work-up on 03/04/2016 included the following normal studies: CBC, factor V Leiden, prothrombin gene mutation, lupus anticoagulant panel, anti-cardiolipin antibodies, beta-2 glycoprotein, protein S total (115%), protein S activity (110%), protein C activity (81%), AT III activity (84%).  Protein C total was 59% (60-150%).  Homocysteine level was 20.5 (0-15).  Creatinine was 1.63 (CrCl 43 ml/min).  Bilirubin was 1.5 (repeat 0.8 on 03/21/2016).  He is due for a colonoscopy.  He has a history of Zenker's diverticulum.  He has a history of oral cancer s/p resection of his inner cheek at the [facility] in 2004.   Lab on 03/21/2016 included a normal CEA (4.2), CA19.9 (22), and PSA (3.56).  He has a 2-3 year history of a 30-40 pound weight loss.  Weight has been fairly stable in the  past year.  He chokes easily.  He has a history of nephrolithiasis and recent hematuria.  Plan: 1.  Discuss evaluation by gastroenterology.  Discuss cardiology clearance.  Discuss likely delay of endoscopy secondary to  recent pulmonary embolism. 2.  Discuss follow-up with Dr. Erlene Quan re: recurrent hematuria. 3.  Continue Eliquis.  Refill. 4.  Cardiology consult re: clearance. 5.  RTC in 1 month for MD assess and labs (CBC with diff, CMP).   Lequita Asal, MD  04/14/2016, 10:09 AM

## 2016-04-14 NOTE — Progress Notes (Signed)
Patient saw Dr. Wilhemena Durie regarding endoscopy.  Cannot do endoscopy as long as he has clots.  Also needs cardiology referral.

## 2016-04-16 ENCOUNTER — Encounter: Payer: Self-pay | Admitting: Hematology and Oncology

## 2016-04-22 DIAGNOSIS — I10 Essential (primary) hypertension: Secondary | ICD-10-CM | POA: Diagnosis not present

## 2016-04-22 DIAGNOSIS — I2692 Saddle embolus of pulmonary artery without acute cor pulmonale: Secondary | ICD-10-CM | POA: Diagnosis not present

## 2016-04-22 DIAGNOSIS — I82431 Acute embolism and thrombosis of right popliteal vein: Secondary | ICD-10-CM | POA: Diagnosis not present

## 2016-04-26 DIAGNOSIS — M9905 Segmental and somatic dysfunction of pelvic region: Secondary | ICD-10-CM | POA: Diagnosis not present

## 2016-04-26 DIAGNOSIS — M5416 Radiculopathy, lumbar region: Secondary | ICD-10-CM | POA: Diagnosis not present

## 2016-04-26 DIAGNOSIS — M955 Acquired deformity of pelvis: Secondary | ICD-10-CM | POA: Diagnosis not present

## 2016-04-26 DIAGNOSIS — M9903 Segmental and somatic dysfunction of lumbar region: Secondary | ICD-10-CM | POA: Diagnosis not present

## 2016-04-28 ENCOUNTER — Ambulatory Visit: Payer: Medicare HMO | Admitting: Urology

## 2016-04-28 ENCOUNTER — Encounter: Payer: Self-pay | Admitting: Urology

## 2016-04-28 VITALS — BP 127/80 | HR 81 | Ht 70.0 in | Wt 180.9 lb

## 2016-04-28 DIAGNOSIS — N200 Calculus of kidney: Secondary | ICD-10-CM

## 2016-04-28 DIAGNOSIS — R310 Gross hematuria: Secondary | ICD-10-CM | POA: Diagnosis not present

## 2016-04-28 DIAGNOSIS — R3129 Other microscopic hematuria: Secondary | ICD-10-CM | POA: Diagnosis not present

## 2016-04-28 DIAGNOSIS — I82431 Acute embolism and thrombosis of right popliteal vein: Secondary | ICD-10-CM | POA: Insufficient documentation

## 2016-04-28 LAB — MICROSCOPIC EXAMINATION: RBC, UA: >30 /hpf — AB (ref 0–?)

## 2016-04-28 LAB — URINALYSIS, COMPLETE
Bilirubin, UA: NEGATIVE
Glucose, UA: NEGATIVE
Leukocytes, UA: NEGATIVE
Nitrite, UA: NEGATIVE
PH UA: 5.5 (ref 5.0–7.5)
SPEC GRAV UA: 1.025 (ref 1.005–1.030)
Urobilinogen, Ur: 0.2 mg/dL (ref 0.2–1.0)

## 2016-04-28 NOTE — Progress Notes (Signed)
04/28/2016 3:55 PM   Walter [name] May 05, 1931 [id number]  Referring provider: Venia Carbon, MD [place],  [address]  Chief Complaint  Patient presents with  . Hematuria    HPI: The patient is a 83 year old gentleman with a past medical history of recent saddle embolism from DVT on Eliquis and nephrolithiasis who presents for evaluation of recurrent gross hematuria.  This all started after starting his blood thinner after being diagnosed with his DVT and PE. He had not experienced gross hematuria in the past prior to this. He denies UTIs or pain with urination. He has a history of a TURP in the past. He has also had multiple endoscopic and lithotripsy procedures for nephrolithiasis. He has not had a obstructive uropathy episode in over 3 years now. There is a concern as to whether the patient developed DVT and PE in the first place as well as for his new onset gross hematuria. He is referred here for complete hematuria evaluation.  He had a CT without contrast in January 2018 which showed small bilateral nonobstructing stones and a large 7.6 cm right renal cyst that was not fully characterized.  Cr: 1.4.   PMH: Past Medical History:  Diagnosis Date  . ARF (acute renal failure) ([place]) 2011   after TKR  . BPH (benign prostatic hypertrophy)   . DVT (deep venous thrombosis) ([place]) 1998   after left knee arthroscopy  . Hx of colonoscopy 2000  . Hypertension   . Hypothyroid 2007   postop from thyroidectomy (cancer scare)  . Kidney stones    recurrent  . Oral cancer ([place]) 2004   graft from left thigh  . Osteoarthritis of both knees   . Parkinson's disease ([place])   . Tendonitis 2012   from quinolone  . Toe amputation status ([place]) 2010    Surgical History: Past Surgical History:  Procedure Laterality Date  . APPENDECTOMY    . CHOLECYSTECTOMY    . HERNIA REPAIR    . LITHOTRIPSY  2009/2010   then stent and cystoscopic removal 2014  . Oral cancer  removed Right 2004  . REPAIR ZENKER'S DIVERTICULA  2013  . THYROIDECTOMY  2007  . TOE AMPUTATION Right 2010   badly overlapping other toe  . TOTAL KNEE ARTHROPLASTY Left   . TRANSURETHRAL RESECTION OF PROSTATE  2011  . UMBILICAL HERNIA REPAIR  2007    Home Medications:  Allergies as of 04/28/2016      Reactions   Quinolones    tendonitis      Medication List       Accurate as of 04/28/16  3:55 PM. Always use your most recent med list.          amLODipine 5 MG tablet Commonly known as:  NORVASC TAKE 1 TABLET EVERY DAY   carbidopa-levodopa 25-100 MG tablet Commonly known as:  SINEMET IR TAKE 1 TABLET THREE TIMES DAILY   ELIQUIS 5 MG Tabs tablet Generic drug:  apixaban TAKE 1 TABLET TWICE DAILY   levothyroxine 112 MCG tablet Commonly known as:  SYNTHROID, LEVOTHROID TAKE 1 TABLET EVERY DAY BEFORE BREAKFAST   polyethylene glycol packet Commonly known as:  MIRALAX / GLYCOLAX Take 17 g by mouth daily as needed.   vitamin B-12 1000 MCG tablet Commonly known as:  CYANOCOBALAMIN Take 1,000 mcg by mouth daily.   Vitamin D 2000 units Caps Take by mouth daily.       Allergies:  Allergies  Allergen Reactions  . Quinolones  tendonitis    Family History: Family History  Problem Relation Age of Onset  . Dementia Mother   . Stroke Father   . Pulmonary disease Brother   . Heart disease Neg Hx   . Diabetes Neg Hx     Social History:  reports that he quit smoking about 14 years ago. His smoking use included Cigarettes. He has never used smokeless tobacco. He reports that he drinks alcohol. He reports that he does not use drugs.  ROS: UROLOGY Frequent Urination?: Yes Hard to postpone urination?: No Burning/pain with urination?: No Get up at night to urinate?: Yes Leakage of urine?: No Urine stream starts and stops?: No Trouble starting stream?: No Do you have to strain to urinate?: No Blood in urine?: No Urinary tract infection?: No Sexually transmitted  disease?: No Injury to kidneys or bladder?: No Painful intercourse?: No Weak stream?: Yes Erection problems?: No Penile pain?: No  Gastrointestinal Nausea?: No Vomiting?: No Indigestion/heartburn?: No Diarrhea?: No Constipation?: Yes  Constitutional Fever: No Night sweats?: No Weight loss?: Yes Fatigue?: No  Skin Skin rash/lesions?: No Itching?: No  Eyes Blurred vision?: No Double vision?: No  Ears/Nose/Throat Sore throat?: No Sinus problems?: No  Hematologic/Lymphatic Swollen glands?: No Easy bruising?: No  Cardiovascular Leg swelling?: No Chest pain?: No  Respiratory Cough?: No Shortness of breath?: No  Endocrine Excessive thirst?: No  Musculoskeletal Back pain?: No Joint pain?: No  Neurological Headaches?: No Dizziness?: No  Psychologic Depression?: No Anxiety?: No  Physical Exam: BP 127/80   Pulse 81   Ht 5\' 10"  (1.778 m)   Wt 180 lb 14.4 oz (82.1 kg)   BMI 25.96 kg/m   Constitutional:  Alert and oriented, No acute distress. HEENT: [place] AT, moist mucus membranes.  Trachea midline, no masses. Cardiovascular: No clubbing, cyanosis, or edema. Respiratory: Normal respiratory effort, no increased work of breathing. GI: Abdomen is soft, nontender, nondistended, no abdominal masses GU: No CVA tenderness. Normal phallus. Testicles descended equal bilaterally no masses. DRE: 2+ smooth benign. Skin: No rashes, bruises or suspicious lesions. Lymph: No cervical or inguinal adenopathy. Neurologic: Grossly intact, no focal deficits, moving all 4 extremities. Psychiatric: Normal mood and affect.  Laboratory Data: Lab Results  Component Value Date   WBC 6.0 03/11/2016   HGB 15.2 03/11/2016   HCT 43.8 03/11/2016   MCV 88.8 03/11/2016   PLT 261 03/11/2016    Lab Results  Component Value Date   CREATININE 1.44 (H) 03/21/2016    Lab Results  Component Value Date   PSA 3.56 03/21/2016    No results found for: TESTOSTERONE  No results  found for: HGBA1C  Urinalysis    Component Value Date/Time   COLORURINE YELLOW (A) 03/11/2016 1029   APPEARANCEUR HAZY (A) 03/11/2016 1029   LABSPEC 1.013 03/11/2016 1029   PHURINE 6.0 03/11/2016 1029   GLUCOSEU NEGATIVE 03/11/2016 1029   HGBUR LARGE (A) 03/11/2016 1029   BILIRUBINUR NEGATIVE 03/11/2016 1029   BILIRUBINUR neg 02/17/2014 1643   KETONESUR 5 (A) 03/11/2016 1029   PROTEINUR 30 (A) 03/11/2016 1029   UROBILINOGEN negative 02/17/2014 1643   NITRITE NEGATIVE 03/11/2016 1029   LEUKOCYTESUR TRACE (A) 03/11/2016 1029    Pertinent Imaging: CT reviewed as above  Assessment & Plan:    1. Gross hematuria I discussed with the patient the formal workup of gross hematuria which includes a CT urogram. He did have a recent CT without contrast over this is not sufficient for proper evaluation of stone with gross hematuria and  especially in one whom we are trying to rule out a malignancy. His creatinine at this time is acceptable for this study. We will have him undergo this study and follow-up for an office cystoscopy. The patient is agreeable to proceeding but they would like to schedule her appointment for follow-up after seeing Dr. Mike Gip from medical oncology. We'll arrange for him to follow-up in approximately 3 weeks which is after that appointment.  Return in about 3 weeks (around 05/19/2016) for after ct urogram for cysto.  Nickie Retort, MD  [facility] [address], [place], [place] [address] [phone]

## 2016-05-10 ENCOUNTER — Ambulatory Visit: Payer: Medicare HMO

## 2016-05-12 ENCOUNTER — Other Ambulatory Visit: Payer: Self-pay | Admitting: *Deleted

## 2016-05-12 ENCOUNTER — Inpatient Hospital Stay (HOSPITAL_BASED_OUTPATIENT_CLINIC_OR_DEPARTMENT_OTHER): Payer: Medicare HMO | Admitting: Hematology and Oncology

## 2016-05-12 ENCOUNTER — Inpatient Hospital Stay: Payer: Medicare HMO | Attending: Hematology and Oncology

## 2016-05-12 ENCOUNTER — Telehealth: Payer: Self-pay | Admitting: Urology

## 2016-05-12 ENCOUNTER — Encounter: Payer: Self-pay | Admitting: Hematology and Oncology

## 2016-05-12 VITALS — BP 135/81 | HR 99 | Temp 97.0°F | Ht 70.0 in | Wt 182.0 lb

## 2016-05-12 DIAGNOSIS — N281 Cyst of kidney, acquired: Secondary | ICD-10-CM | POA: Insufficient documentation

## 2016-05-12 DIAGNOSIS — Z87891 Personal history of nicotine dependence: Secondary | ICD-10-CM | POA: Insufficient documentation

## 2016-05-12 DIAGNOSIS — N400 Enlarged prostate without lower urinary tract symptoms: Secondary | ICD-10-CM | POA: Diagnosis not present

## 2016-05-12 DIAGNOSIS — Z79899 Other long term (current) drug therapy: Secondary | ICD-10-CM | POA: Diagnosis not present

## 2016-05-12 DIAGNOSIS — I10 Essential (primary) hypertension: Secondary | ICD-10-CM

## 2016-05-12 DIAGNOSIS — E039 Hypothyroidism, unspecified: Secondary | ICD-10-CM | POA: Insufficient documentation

## 2016-05-12 DIAGNOSIS — I2692 Saddle embolus of pulmonary artery without acute cor pulmonale: Secondary | ICD-10-CM | POA: Insufficient documentation

## 2016-05-12 DIAGNOSIS — R1310 Dysphagia, unspecified: Secondary | ICD-10-CM

## 2016-05-12 DIAGNOSIS — Z7901 Long term (current) use of anticoagulants: Secondary | ICD-10-CM

## 2016-05-12 DIAGNOSIS — Z87442 Personal history of urinary calculi: Secondary | ICD-10-CM

## 2016-05-12 DIAGNOSIS — R319 Hematuria, unspecified: Secondary | ICD-10-CM

## 2016-05-12 DIAGNOSIS — I2699 Other pulmonary embolism without acute cor pulmonale: Secondary | ICD-10-CM

## 2016-05-12 DIAGNOSIS — I82411 Acute embolism and thrombosis of right femoral vein: Secondary | ICD-10-CM | POA: Insufficient documentation

## 2016-05-12 DIAGNOSIS — G20 Parkinson's disease: Secondary | ICD-10-CM | POA: Insufficient documentation

## 2016-05-12 LAB — COMPREHENSIVE METABOLIC PANEL
ALT: 6 U/L — ABNORMAL LOW (ref 17–63)
AST: 17 U/L (ref 15–41)
Albumin: 4 g/dL (ref 3.5–5.0)
Alkaline Phosphatase: 73 U/L (ref 38–126)
Anion gap: 6 (ref 5–15)
BUN: 19 mg/dL (ref 6–20)
CO2: 30 mmol/L (ref 22–32)
Calcium: 8.9 mg/dL (ref 8.9–10.3)
Chloride: 105 mmol/L (ref 101–111)
Creatinine, Ser: 1.61 mg/dL — ABNORMAL HIGH (ref 0.61–1.24)
GFR calc Af Amer: 44 mL/min — ABNORMAL LOW (ref >60)
GFR calc non Af Amer: 38 mL/min — ABNORMAL LOW (ref >60)
Glucose, Bld: 79 mg/dL (ref 65–99)
Potassium: 4.4 mmol/L (ref 3.5–5.1)
Sodium: 141 mmol/L (ref 135–145)
Total Bilirubin: 0.8 mg/dL (ref 0.3–1.2)
Total Protein: 7.1 g/dL (ref 6.5–8.1)

## 2016-05-12 LAB — CBC WITH DIFFERENTIAL/PLATELET
Basophils Absolute: 0 10*3/uL (ref 0–0.1)
Basophils Relative: 0 %
Eosinophils Absolute: 0.1 10*3/uL (ref 0–0.7)
Eosinophils Relative: 1 %
HCT: 44.8 % (ref 40.0–52.0)
Hemoglobin: 15.2 g/dL (ref 13.0–18.0)
Lymphocytes Relative: 21 %
Lymphs Abs: 1.3 10*3/uL (ref 1.0–3.6)
MCH: 30.2 pg (ref 26.0–34.0)
MCHC: 34 g/dL (ref 32.0–36.0)
MCV: 88.8 fL (ref 80.0–100.0)
Monocytes Absolute: 0.6 10*3/uL (ref 0.2–1.0)
Monocytes Relative: 11 %
Neutro Abs: 4 10*3/uL (ref 1.4–6.5)
Neutrophils Relative %: 67 %
Platelets: 254 10*3/uL (ref 150–440)
RBC: 5.05 MIL/uL (ref 4.40–5.90)
RDW: 13.7 % (ref 11.5–14.5)
WBC: 6 10*3/uL (ref 3.8–10.6)

## 2016-05-12 NOTE — Progress Notes (Signed)
Patient here for follow up no changes since last appointment 

## 2016-05-12 NOTE — Telephone Encounter (Signed)
Just F.Y.I. Pt cancelled CT and cysto appt.  He said he was going to hold off and may call back in 3 months or so to reschedule.

## 2016-05-12 NOTE — Progress Notes (Signed)
[facility] day:  05/12/2016  Chief Complaint: Walter [name] is a 83 y.o. male with a saddle pulmonary embolism and lower extremity DVT who is seen for assessment after interval cardiology consult.  HPI:  The patient was last seen in the hematology clinic on 04/14/2016.  At that time, he was taking Eliquis.  He noted a weight loss of 30-40 pounds over a 2-3 year period.  Weight had been fairly stable in the past year.  He described choking easily.  He had seen gastroenterology.  He had a history of nephrolithiasis and recent hematuria.  He was to follow-up with Dr. Erlene Quan and cardiology.  CT renal stone study on 03/21/2016 revealed numerous small bilateral nonobstructing renal stones without hydronephrosis.  There was a large 7.6 cm right renal cyst which could not be fully characterized without intravenous contrast.  There was mild aneurysmal dilatation of the mid abdominal aorta, 3.5 cm just below the renal arteries.   He was seen by Dr. Serafina Royals on 04/22/2016.   Recommendations were to proceed to surgery and/or invasive procedure without restriction. He was felt to be at the lowest risk possible for cardiovascular complication. He may discontinue anticoagulation 3 days prior to surgical intervention and or procedure for reduced bleeding risk. He will restart at a safe period after procedure when bleeding risk is reduced. In addition, after the biopsy, he would reinstate treatment of long-term anticoagulation due to concerns of recurrence of deep venous thrombosis and inactivity due to Parkinson's.  He was seen by Dr. Pilar Jarvis on 04/28/2016.  Plan is for CT urogram and office cystoscopy next week.  Symptomatically, he feels fine.  He is frustrated that he can't play golf.  Because of his parkinson's disease, he has trouble getting dressed.  He also notes a change in voice.  He says "I feel good now".   Past Medical History:  Diagnosis Date  . ARF  (acute renal failure) ([place]) 2011   after TKR  . BPH (benign prostatic hypertrophy)   . DVT (deep venous thrombosis) ([place]) 1998   after left knee arthroscopy  . Hx of colonoscopy 2000  . Hypertension   . Hypothyroid 2007   postop from thyroidectomy (cancer scare)  . Kidney stones    recurrent  . Oral cancer ([place]) 2004   graft from left thigh  . Osteoarthritis of both knees   . Parkinson's disease ([place])   . Tendonitis 2012   from quinolone  . Toe amputation status ([place]) 2010    Past Surgical History:  Procedure Laterality Date  . APPENDECTOMY    . CHOLECYSTECTOMY    . HERNIA REPAIR    . LITHOTRIPSY  2009/2010   then stent and cystoscopic removal 2014  . Oral cancer removed Right 2004  . REPAIR ZENKER'S DIVERTICULA  2013  . THYROIDECTOMY  2007  . TOE AMPUTATION Right 2010   badly overlapping other toe  . TOTAL KNEE ARTHROPLASTY Left   . TRANSURETHRAL RESECTION OF PROSTATE  2011  . UMBILICAL HERNIA REPAIR  2007    Family History  Problem Relation Age of Onset  . Dementia Mother   . Stroke Father   . Pulmonary disease Brother   . Heart disease Neg Hx   . Diabetes Neg Hx     Social History:  reports that he quit smoking about 14 years ago. His smoking use included Cigarettes. He has never used smokeless tobacco. He reports that he drinks alcohol. He reports  that he does not use drugs.  He previously smoked 2-3 cigars/day for 30-40 years.  He stopped smoking in 2004.  He previously lived in [place].  He lives in [place].  They moved to [facility].  He and his wife met in junior high school.  They have been married 37 years.  The patient is accompanied by his wife, Walter [name], today.  Allergies:  Allergies  Allergen Reactions  . Quinolones     tendonitis    Current Medications: Current Outpatient Prescriptions  Medication Sig Dispense Refill  . amLODipine (NORVASC) 5 MG tablet TAKE 1 TABLET EVERY DAY 90 tablet 3  . carbidopa-levodopa (SINEMET IR) 25-100 MG  tablet TAKE 1 TABLET THREE TIMES DAILY 270 tablet 2  . Cholecalciferol (VITAMIN D) 2000 UNITS CAPS Take by mouth daily.    Marland Kitchen ELIQUIS 5 MG TABS tablet TAKE 1 TABLET TWICE DAILY 60 tablet 0  . levothyroxine (SYNTHROID, LEVOTHROID) 112 MCG tablet TAKE 1 TABLET EVERY DAY BEFORE BREAKFAST 90 tablet 2  . polyethylene glycol (MIRALAX / GLYCOLAX) packet Take 17 g by mouth daily as needed.     . vitamin B-12 (CYANOCOBALAMIN) 1000 MCG tablet Take 1,000 mcg by mouth daily.     No current facility-administered medications for this visit.     Review of Systems:  GENERAL:  Feels good.  Active.  No fevers or sweats.  Weight loss of 30-40 pounds in 2-3 years (fairly stable in past year).  Weight stable since last visit. PERFORMANCE STATUS (ECOG):  1 HEENT:  Change in voice.  No visual changes, runny nose, sore throat, mouth sores or tenderness. Lungs: No shortness of breath or cough.  No hemoptysis. Cardiac:  No chest pain, palpitations, orthopnea, or PND. GI:  Chokes easily.  Takes small bites and chews food well.  No nausea, vomiting, diarrhea, constipation, melena or hematochezia. GU:  No urgency, frequency, dysuria.  Hematuria with a h/o nephrolithiasis. Musculoskeletal:  No back pain.  No joint pain.  No muscle tenderness. Extremities:  No pain or swelling. Skin:  No rashes or skin changes. Neuro:  No headache, numbness or weakness, balance or coordination issues. Endocrine:  No diabetes.  Thyroid disease on Synthroid.  No hot flashes or night sweats. Psych:  No mood changes, depression or anxiety. Pain:  No focal pain. Review of systems:  All other systems reviewed and found to be negative.  Physical Exam: Blood pressure 135/81, pulse 99, temperature 97 F (36.1 C), temperature source Tympanic, height '5\' 10"'$  (1.778 m), weight 182 lb (82.6 kg). GENERAL:  Well developed, well nourished, gentleman sitting comfortably in the exam room in no acute distress. MENTAL STATUS:  Alert and oriented to  person, place and time. HEAD:  Pearline Cables hair.  Normocephalic, atraumatic, face symmetric, no Cushingoid features. EYES:  Blue eyes.  No conjunctivitis or scleral icterus. NEUROLOGICAL: Unremarkable. PSYCH:  Appropriate.   Appointment on 05/12/2016  Component Date Value Ref Range Status  . WBC 05/12/2016 6.0  3.8 - 10.6 K/uL Final  . RBC 05/12/2016 5.05  4.40 - 5.90 MIL/uL Final  . Hemoglobin 05/12/2016 15.2  13.0 - 18.0 g/dL Final  . HCT 05/12/2016 44.8  40.0 - 52.0 % Final  . MCV 05/12/2016 88.8  80.0 - 100.0 fL Final  . MCH 05/12/2016 30.2  26.0 - 34.0 pg Final  . MCHC 05/12/2016 34.0  32.0 - 36.0 g/dL Final  . RDW 05/12/2016 13.7  11.5 - 14.5 % Final  . Platelets 05/12/2016 254  150 -  440 K/uL Final  . Neutrophils Relative % 05/12/2016 67  % Final  . Neutro Abs 05/12/2016 4.0  1.4 - 6.5 K/uL Final  . Lymphocytes Relative 05/12/2016 21  % Final  . Lymphs Abs 05/12/2016 1.3  1.0 - 3.6 K/uL Final  . Monocytes Relative 05/12/2016 11  % Final  . Monocytes Absolute 05/12/2016 0.6  0.2 - 1.0 K/uL Final  . Eosinophils Relative 05/12/2016 1  % Final  . Eosinophils Absolute 05/12/2016 0.1  0 - 0.7 K/uL Final  . Basophils Relative 05/12/2016 0  % Final  . Basophils Absolute 05/12/2016 0.0  0 - 0.1 K/uL Final  . Sodium 05/12/2016 141  135 - 145 mmol/L Final  . Potassium 05/12/2016 4.4  3.5 - 5.1 mmol/L Final  . Chloride 05/12/2016 105  101 - 111 mmol/L Final  . CO2 05/12/2016 30  22 - 32 mmol/L Final  . Glucose, Bld 05/12/2016 79  65 - 99 mg/dL Final  . BUN 05/12/2016 19  6 - 20 mg/dL Final  . Creatinine, Ser 05/12/2016 1.61* 0.61 - 1.24 mg/dL Final  . Calcium 05/12/2016 8.9  8.9 - 10.3 mg/dL Final  . Total Protein 05/12/2016 7.1  6.5 - 8.1 g/dL Final  . Albumin 05/12/2016 4.0  3.5 - 5.0 g/dL Final  . AST 05/12/2016 17  15 - 41 U/L Final  . ALT 05/12/2016 6* 17 - 63 U/L Final  . Alkaline Phosphatase 05/12/2016 73  38 - 126 U/L Final  . Total Bilirubin 05/12/2016 0.8  0.3 - 1.2 mg/dL Final   . GFR calc non Af Amer 05/12/2016 38* >60 mL/min Final  . GFR calc Af Amer 05/12/2016 44* >60 mL/min Final   Comment: (NOTE) The eGFR has been calculated using the CKD EPI equation. This calculation has not been validated in all clinical situations. eGFR's persistently <60 mL/min signify possible Chronic Kidney Disease.   Georgiann Hahn gap 05/12/2016 6  5 - 15 Final    Assessment:  Walter [name] is a 83 y.o. male with a history of recurrent thrombosis.  He had a left lower extremity DVT in 1990 following arthroscopic surgery.  He was on Coumadin < 1 year.  He developed a pulmonary embolism on 03/03/2016 without apparent precipitating events.  Three weeks prior, he had been on a 2 1/2 hour flight.  He is on Eliquis.  Chest CT angiogram on 03/03/2016 revealed a moderate to large burden of pulmonary emboli in all lobes including a saddle embolus with no evidence of heart strain.  There was a small hiatal hernia with possible associated wall thickening in the distal esophagus.  Bilateral lower extremity duplex on 03/04/2016 revealed an age-indeterminate short-segment occlusive DVT within distal aspect of the right femoral vein.  Hypercoagulable work-up on 03/04/2016 included the following normal studies: CBC, factor V Leiden, prothrombin gene mutation, lupus anticoagulant panel, anti-cardiolipin antibodies, beta-2 glycoprotein, protein S total (115%), protein S activity (110%), protein C activity (81%), AT III activity (84%).  Protein C total was 59% (60-150%).  Homocysteine level was 20.5 (0-15).  Creatinine was 1.63 (CrCl 43 ml/min).  Bilirubin was 1.5 (repeat 0.8 on 03/21/2016).  He is due for a colonoscopy.  He has a history of Zenker's diverticulum.   He has a history of nephrolithiasis.  CT renal stone study on 03/21/2016 revealed numerous small bilateral nonobstructing renal stones without hydronephrosis.  There was a large 7.6 cm right renal cyst which could not be fully characterized without  intravenous contrast.  CT urogram and  cystoscopy are planned.  He has a history of oral cancer s/p resection of his inner cheek at the [facility] in 2004.   Lab on 03/21/2016 included a normal CEA (4.2), CA19.9 (22), and PSA (3.56).  He has a 2-3 year history of a 30-40 pound weight loss.  Weight has been stable in the past year.  He chokes easily.  He has a history of nephrolithiasis and recent hematuria.  Plan: 1.  Discuss cardiology clearance.  Discuss plan for EGD + colonoscopy after 3 months of anticoagulation and holding Eliquis prior to the procedure.  Patient has decided that he does not want to proceed with endoscopy secondary to his age.  Despite a long conversation, patient wishes to defer unless he is concerned about his symptoms based on his age. 2.  Discuss follow-up with Dr. Pilar Jarvis.  Patient also wishes to postpone any evaluation. 3.  Patient to call GI and Urology regarding scheduled upcoming procedures. 4.  Continue Eliquis.   5.  RTC in 3 months for MD assessment and labs (CBC with diff, CMP).   Lequita Asal, MD  05/12/2016, 12:20 PM

## 2016-05-18 ENCOUNTER — Ambulatory Visit: Payer: Medicare HMO

## 2016-05-23 ENCOUNTER — Other Ambulatory Visit: Payer: Self-pay | Admitting: Internal Medicine

## 2016-05-24 DIAGNOSIS — M955 Acquired deformity of pelvis: Secondary | ICD-10-CM | POA: Diagnosis not present

## 2016-05-24 DIAGNOSIS — M9903 Segmental and somatic dysfunction of lumbar region: Secondary | ICD-10-CM | POA: Diagnosis not present

## 2016-05-24 DIAGNOSIS — M9905 Segmental and somatic dysfunction of pelvic region: Secondary | ICD-10-CM | POA: Diagnosis not present

## 2016-05-24 DIAGNOSIS — M5416 Radiculopathy, lumbar region: Secondary | ICD-10-CM | POA: Diagnosis not present

## 2016-05-25 ENCOUNTER — Other Ambulatory Visit: Payer: Medicare HMO

## 2016-05-30 ENCOUNTER — Encounter: Payer: Self-pay | Admitting: Speech Pathology

## 2016-05-30 ENCOUNTER — Ambulatory Visit: Payer: Medicare HMO | Attending: Neurology | Admitting: Occupational Therapy

## 2016-05-30 ENCOUNTER — Encounter: Payer: Self-pay | Admitting: Occupational Therapy

## 2016-05-30 ENCOUNTER — Ambulatory Visit: Payer: Medicare HMO | Admitting: Speech Pathology

## 2016-05-30 DIAGNOSIS — R262 Difficulty in walking, not elsewhere classified: Secondary | ICD-10-CM | POA: Insufficient documentation

## 2016-05-30 DIAGNOSIS — M6281 Muscle weakness (generalized): Secondary | ICD-10-CM | POA: Diagnosis not present

## 2016-05-30 DIAGNOSIS — R490 Dysphonia: Secondary | ICD-10-CM | POA: Insufficient documentation

## 2016-05-30 DIAGNOSIS — R278 Other lack of coordination: Secondary | ICD-10-CM | POA: Insufficient documentation

## 2016-05-30 DIAGNOSIS — R2681 Unsteadiness on feet: Secondary | ICD-10-CM | POA: Insufficient documentation

## 2016-05-30 NOTE — Therapy (Signed)
[facility] SERVICES [address] [place], [place], [address] Phone: [phone]   Fax:  [phone]  Speech Language Pathology Evaluation  Patient Details  Name: Walter [name] MRN: [id number] Date of Birth: 1931-09-30 Referring Provider: Ludwig Clarks  Encounter Date: 05/30/2016      End of Session - 05/30/16 1235    Visit Number 1   Number of Visits 17   Date for SLP Re-Evaluation 07/01/16   SLP Start Time 42   SLP Stop Time  1146   SLP Time Calculation (min) 46 min   Activity Tolerance Patient tolerated treatment well      Past Medical History:  Diagnosis Date   ARF (acute renal failure) ([place]) 2011   after TKR   BPH (benign prostatic hypertrophy)    DVT (deep venous thrombosis) ([place]) 1998   after left knee arthroscopy   Hx of colonoscopy 2000   Hypertension    Hypothyroid 2007   postop from thyroidectomy (cancer scare)   Kidney stones    recurrent   Oral cancer ([place]) 2004   graft from left thigh   Osteoarthritis of both knees    Parkinson's disease ([place])    Tendonitis 2012   from quinolone   Toe amputation status ([place]) 2010    Past Surgical History:  Procedure Laterality Date   APPENDECTOMY     CHOLECYSTECTOMY     HERNIA REPAIR     LITHOTRIPSY  2009/2010   then stent and cystoscopic removal 2014   Oral cancer removed Right 2004   REPAIR ZENKER'S DIVERTICULA  2013   THYROIDECTOMY  2007   TOE AMPUTATION Right 2010   badly overlapping other toe   TOTAL KNEE ARTHROPLASTY Left    TRANSURETHRAL RESECTION OF PROSTATE  5638   UMBILICAL HERNIA REPAIR  2007    There were no vitals filed for this visit.          SLP Evaluation [facility] - 05/30/16 1227      SLP Visit Information   SLP Received On 05/30/16   Referring Provider TAT, REBECCA S   Onset Date 03/31/2016     Subjective   Subjective "[name]"   Patient/Family Stated Goal Improve loudness      Pain Assessment   Currently in Pain? No/denies     General Information   HPI 83 year old man diagnosed with Parkinson's disease and moderate oropharyngeal dysphagia.     Prior Functional Status   Cognitive/Linguistic Baseline Within functional limits     Oral Motor/Sensory Function   Overall Oral Motor/Sensory Function Appears within functional limits for tasks assessed     Motor Speech   Overall Motor Speech Impaired   Respiration Impaired   Level of Impairment Conversation   Phonation Hoarse;Low vocal intensity   Resonance Within functional limits   Articulation Within functional limitis   Intelligibility Intelligible   Phonation Impaired   Volume Soft     Standardized Assessments   Standardized Assessments  Other Assessment  LSVT-LOUD Voice Evaluation      LSVT-LOUD Voice Evaluation Maximum phonation time for sustained ah: 15 seconds Mean intensity during sustained ah: 66 dB  Mean intensity sustained during conversational speech: 65 dB Average time patient was able to sustain /s/: 7 seconds Average time patient was able to sustain /z/: 17 seconds s/z ratio : 0.4 Highest dynamic pitch when altering pitch from a low note to a high note: 187 Hz Highest  pitch during conversational speech: 130 Hz Lowest dynamic pitch when altering from a high note to a low note: 82 Hz Lowest pitch during conversational speech: 95 Hz  Average fundamental frequency during sustained ah: 107 Hz (1.6 STD below mean for age and gender) Visi-Pitch: Museum/gallery exhibitions officer (MDVP)  MDVP extracts objective quantitative values (Relative Average Perturbation, Shimmer, Voice Turbulence Index, and Noise to Harmonic Ratio) on sustained phonation, which are displayed graphically and numerically in comparison to a built-in normative database.  The patient exhibited values outside the norm for Relative Average Perturbation, Shimmer, Voice Turbulence Index, and Noise to Harmonic Ratio.   Average fundamental frequency was 1.6 STD below the average for age and gender. The patient improved all parameters when cued to alter voicing (loud like me).  Simulatability: Improved vocal quality with loud voice.       SLP Education - 05/30/16 1235    Education provided Yes   Education Details LSVT-LOUD protocol   Person(s) Educated Patient   Methods Explanation   Comprehension Verbalized understanding            SLP Long Term Goals - 05/30/16 1237      SLP LONG TERM GOAL #1   Title The patient will complete Daily Tasks (Maximum duration "ah", High/Lows, and Functional Phrases) at average loudness of 80 dB and with loud, good quality voice.    Time 4   Period Weeks   Status New     SLP LONG TERM GOAL #2   Title The patient will complete Hierarchal Speech Loudness reading drills (words/phrases, sentences, and paragraph) at average 75 dB and with loud, good quality voice.     Time 4   Period Weeks   Status New     SLP LONG TERM GOAL #3   Title The patient will participate in conversation, maintaining average loudness of 75 dB and loud, good quality voice.   Time 4   Period Weeks   Status New     SLP LONG TERM GOAL #4   Title The patient will complete homework daily.   Time 4   Period Weeks   Status New          Plan - 05/30/16 1236    Clinical Impression Statement This 83 year old man with diagnosed Parkinson's disease is presenting with moderate voice disorder characterized by hoarse vocal quality, hypophonia, and monotone voice.  Based on stimulability testing, the patient is judged to be a good candidate for the LSVT LOUD program.  It is recommended that the patient receive the LSVT LOUD program which is comprised of 16 intensive sessions (4 times per week for 4 weeks, one hour sessions).  Prognosis for improvement is good based on motivation, stimulability, and strong family support.  LSVT LOUD has been documented in the literature as efficacious for  individuals with Parkinson's disease.     Speech Therapy Frequency 4x / week   Duration 4 weeks   Treatment/Interventions Patient/family education;Other (comment)  LSVT-LOUD   Potential to Achieve Goals Good   Potential Considerations Ability to learn/carryover information;Co-morbidities;Cooperation/participation level;Previous level of function;Severity of impairments;Family/community support   SLP Home Exercise Plan LSVT-LOUD daily homework   Consulted and Agree with Plan of Care Patient      Patient will benefit from skilled therapeutic intervention in order to improve the following deficits and impairments:   Dysphonia - Plan: SLP plan of care cert/re-cert      G-Codes - 11/94/17 1239    Functional Assessment Tool Used  LSVT-LOUD evaluation protocol, clinical judgment   Functional Limitations Voice   Voice Current Status [phone]) At least 40 percent but less than 60 percent impaired, limited or restricted   Voice Goal Status ([id number]) At least 1 percent but less than 20 percent impaired, limited or restricted      Problem List Patient Active Problem List   Diagnosis Date Noted   Deep vein thrombosis (DVT) of femoral vein of right lower extremity ([place]) 03/05/2016   Elevated troponin 03/05/2016   CKD (chronic kidney disease) stage 3, GFR 30-59 ml/min 03/05/2016   Acute saddle pulmonary embolism without acute cor pulmonale ([place]) 03/03/2016   Vitamin B12 deficiency 09/09/2014   Preventative health care 08/04/2014   Parkinson's disease ([place]) 08/04/2014   Esophageal diverticulum 01/01/2014   Actinic keratosis 07/31/2013   Hypertension    Osteoarthritis of both knees    Toe amputation status ([place])    Benign prostatic hyperplasia    Kidney stones    Hypothyroid    Tendonitis    [name], MS/CCC- SLP  Lou Miner 05/30/2016, 12:42 PM  [facility] [address] [place], [place],  [address] Phone: [phone]   Fax:  [phone]  Name: Walter [name] MRN: [id number] Date of Birth: 09/15/1931

## 2016-05-31 ENCOUNTER — Encounter: Payer: Self-pay | Admitting: Occupational Therapy

## 2016-05-31 NOTE — Therapy (Signed)
[place] MAIN [facility] SERVICES [address] [place], [place], [address] Phone: [phone]   Fax:  [phone]  Occupational Therapy Evaluation  Patient Details  Name: Walter [name] MRN: [id number] Date of Birth: 03-06-1931 Referring Provider: Tat  Encounter Date: 05/30/2016      OT End of Session - 05/31/16 2021    Visit Number 1   Number of Visits 17   Date for OT Re-Evaluation 07/08/16   Authorization Type Medicare G code 1   OT Start Time 1000   OT Stop Time 1100   OT Time Calculation (min) 60 min   Activity Tolerance Patient tolerated treatment well   Behavior During Therapy [facility] for tasks assessed/performed      Past Medical History:  Diagnosis Date  . ARF (acute renal failure) ([place]) 2011   after TKR  . BPH (benign prostatic hypertrophy)   . DVT (deep venous thrombosis) ([place]) 1998   after left knee arthroscopy  . Hx of colonoscopy 2000  . Hypertension   . Hypothyroid 2007   postop from thyroidectomy (cancer scare)  . Kidney stones    recurrent  . Oral cancer ([place]) 2004   graft from left thigh  . Osteoarthritis of both knees   . Parkinson's disease ([place])   . Tendonitis 2012   from quinolone  . Toe amputation status ([place]) 2010    Past Surgical History:  Procedure Laterality Date  . APPENDECTOMY    . CHOLECYSTECTOMY    . HERNIA REPAIR    . LITHOTRIPSY  2009/2010   then stent and cystoscopic removal 2014  . Oral cancer removed Right 2004  . REPAIR ZENKER'S DIVERTICULA  2013  . THYROIDECTOMY  2007  . TOE AMPUTATION Right 2010   badly overlapping other toe  . TOTAL KNEE ARTHROPLASTY Left   . TRANSURETHRAL RESECTION OF PROSTATE  2011  . UMBILICAL HERNIA REPAIR  2007    There were no vitals filed for this visit.      Subjective Assessment - 05/31/16 2018    Subjective  Patient reports he would like to be able to walk better, have improved balance, dress himself with greater ease and improve strength in right hand.     Pertinent History Patient was diagnosed with Parkinson's disease about 1 year ago, reports he also was diagnosed with tendonitis in bilateral lower extremities, recently had blood clots in his lung 03-03-16, right foot with previous second toe removal.  He also has chronic back pain.     Patient Stated Goals Patient reports he would like to be able to play golf again, take care of himself, be active.    Currently in Pain? No/denies   Pain Score 0-No pain   Multiple Pain Sites No           [facility] OT Assessment - 05/31/16 2037      Assessment   Diagnosis Parkinson's disease   Referring Provider Tat   Onset Date 03/15/15   Prior Therapy   none     Balance Screen   Has the patient fallen in the past 6 months No   Has the patient had a decrease in activity level because of a fear of falling?  No   Is the patient reluctant to leave their home because of a fear of falling?  No     Home  Environment   Family/patient expects to be discharged to: Private residence   Living Arrangements Spouse/significant other   Available Help at Discharge  Family   Type of [place] Access Level entry   Home Layout Two level   Bathroom Shower/Tub Walk-in Shower;Curtain   [place] - single point;Shower seat;Grab bars - tub/shower;Grab bars - toilet   Lives With Spouse     Prior Function   Level of Independence Independent   Vocation Retired     ADL   Eating/Feeding Modified independent   Grooming Modified independent   Upper Body Bathing Modified independent   Lower Body Bathing Increased time   Upper Body Dressing Increased time;Needs assist for fasteners   Lower Body Dressing Increased time   Toilet Tranfer Modified independent   Toileting - Clothing Manipulation Modified independent;Increased time   Toileting -  Hygiene Modified Independent   Tub/Shower Transfer Modified independent   ADL comments Patient demonstrates difficulty with  bathing, lower body dressing especially with managing underwear, difficulty with opening jars and containers and has to use a shoe horn when donning shoes.  He requires increased time to complete all self care tasks and occasionally requires assistance if in a hurry to get ready to make an appointment.     IADL   Prior Level of Function Shopping independent   Shopping Shops independently for small purchases   Prior Level of Function Light Housekeeping independent   Light Housekeeping Performs light daily tasks such as dishwashing, bed making   Prior Level of Function Meal Prep independent   Meal Prep Able to complete simple cold meal and snack prep   Community Mobility Drives own vehicle   Medication Management Is responsible for taking medication in correct dosages at correct time   Prior Level of Function Financial Management independent   Physiological scientist financial matters independently (budgets, writes checks, pays rent, bills goes to bank), collects and keeps track of income     Mobility   Mobility Status History of falls;Needs assist   Mobility Status Comments Patient ambulates without an assistive device however, he demonstrates shorter step length, shuffled gait, decreased balance with turning behaviors and decreased reciprocal arm swing.  Fatigues quickly and reports 6 minute walk test was the furthest he has walked in a year.  6 minute walk test 1040 feet, 5 times sit to stand 16 sec.       Written Expression   Dominant Hand Right   Handwriting 75% legible     Activity Tolerance   Activity Tolerance Tolerates 30 min activity with muliple rests     Cognition   Overall Cognitive Status Within Functional Limits for tasks assessed   Memory --  reports decreased memory over time     Sensation   Light Touch Appears Intact   Stereognosis Appears Intact   Proprioception Appears Intact     Coordination   Gross Motor Movements are Fluid and Coordinated No   Fine Motor  Movements are Fluid and Coordinated No   9 Hole Peg Test Right;Left   Right 9 Hole Peg Test 26 secs   Left 9 Hole Peg Test 23 sec.   Coordination impaired for reciprocal arm swing during functional mobility     ROM / Strength   AROM / PROM / Strength AROM;Strength     AROM   Overall AROM  Within functional limits for tasks performed     Strength   Overall Strength Deficits   Overall Strength Comments 4/5 overall BUE, 3+/5 BLE     Hand Function   Right Hand Grip (  lbs) 59   Left Hand Grip (lbs) 55        Balance to be assessed further next session with BERG balance scale.                 OT Education - 05/31/16 2021    Education provided Yes   Education Details LSVT BIG program   Person(s) Educated Patient   Methods Explanation;Demonstration   Comprehension Returned demonstration;Verbalized understanding             OT Long Term Goals - 05/31/16 2110      OT LONG TERM GOAL #1   Title Patient will improve gait speed and endurance and be able to walk 1200 feet in 6 minutes to negotiate around the home and community safely in 4 weeks   Baseline 1040 feet at evaluation   Time 4   Period Weeks   Status New     OT LONG TERM GOAL #2   Title Patient will complete HEP for maximal daily exercises with modified independence in 4 weeks   Baseline no current home program at eval   Time 4   Period Weeks   Status New     OT LONG TERM GOAL #3   Title Patient will transfer from sit to stand without the use of arms safely and independently from a variety of chairs/surfaces in 4 weeks.    Baseline difficulty from low surfaces   Time 4   Period Weeks   Status New     OT LONG TERM GOAL #4   Title Patient will complete basic self care tasks with modified independence with good speed in order to be on time for all appointments.    Baseline patient slow to complete tasks and occasionally requires assist to meet deadlines/appts.   Time 4   Period Weeks   Status  New     OT LONG TERM GOAL #5   Title Patient will demonstrate improved grip strength right hand by 5# to open jars and containers with modified independence.    Baseline difficulty with opening jars and containers at eval   Time 4   Period Weeks   Status New               Plan - 05/31/16 2022    Clinical Impression Statement Patient is a 83 yo male diagnosed with Parkinson's disease and was referred by his physician for LSVT BIG program. Patient presents with falls in the past, decreased step length with gait patterns, decreased reciprocal arm swing, decreased balance,  decreased coordination, and muscle strength which affect his ability to perform daily tasks. The patient is judged to be an excellent candidate for the LSVT BIG program. He would benefit from and was referred for the LSVT BIG program which is an intensive program designed specifically for Parkinson's patients with a focus on increasing amplitude and speed of movements, improving self-care and daily tasks and providing patients with daily exercises to improve overall function. It is recommended that the patient receive the LSVT BIG program which is comprised of 16 intensive sessions (4 times per week for 4 weeks, one hour sessions). Prognosis for improvement is good based on his motivation and strong family support. LSVT BIG has been documented in the literature as efficacious for individuals with Parkinson's disease.    Rehab Potential Good   Clinical Impairments Affecting Rehab Potential positive:  motivation, family support. Negative: progressive disease process, medical co morbidities   OT Frequency 4x / week  OT Duration 4 weeks   OT Treatment/Interventions Self-care/ADL training;DME and/or AE instruction;Patient/family education;Gait Training;Therapeutic exercises;Balance training;Therapeutic exercise;Stair Training;Therapeutic activities;Neuromuscular education;Functional Mobility Training;Manual Therapy   Consulted  and Agree with Plan of Care Patient      Patient will benefit from skilled therapeutic intervention in order to improve the following deficits and impairments:  Abnormal gait, Decreased coordination, Decreased range of motion, Difficulty walking, Improper body mechanics, Decreased endurance, Decreased activity tolerance, Decreased balance, Impaired UE functional use, Pain, Decreased mobility, Decreased strength, Decreased knowledge of use of DME  Visit Diagnosis: Difficulty in walking, not elsewhere classified  Muscle weakness (generalized)  Other lack of coordination  Unsteadiness on feet      G-Codes - 06-30-16 26-Jun-2025    Functional Assessment Tool Used (Outpatient only) --   Functional Limitation --   Mobility: Walking and Moving Around Current Status (R0076) --   Mobility: Walking and Moving Around Goal Status (A2633) --      Problem List Patient Active Problem List   Diagnosis Date Noted  . Deep vein thrombosis (DVT) of femoral vein of right lower extremity ([place]) 03/05/2016  . Elevated troponin 03/05/2016  . CKD (chronic kidney disease) stage 3, GFR 30-59 ml/min 03/05/2016  . Acute saddle pulmonary embolism without acute cor pulmonale (HCC) 03/03/2016  . Vitamin B12 deficiency 09/09/2014  . Preventative health care 08/04/2014  . Parkinson's disease ([place]) 08/04/2014  . Esophageal diverticulum 01/01/2014  . Actinic keratosis 07/31/2013  . Hypertension   . Osteoarthritis of both knees   . Toe amputation status ([place])   . Benign prostatic hyperplasia   . Kidney stones   . Hypothyroid   . Tendonitis    Walter [name] Oneita Jolly, OTR/L, CLT  Esmerelda Finnigan 06-30-2016, 9:16 PM  [place] MAIN [facility] SERVICES [address] [place], [place], [address] Phone: [phone]   Fax:  [phone]  Name: Walter [name] MRN: [id number] Date of Birth: 06-28-1931

## 2016-06-06 ENCOUNTER — Encounter: Payer: Medicare HMO | Admitting: Speech Pathology

## 2016-06-06 ENCOUNTER — Ambulatory Visit (INDEPENDENT_AMBULATORY_CARE_PROVIDER_SITE_OTHER): Payer: Medicare HMO | Admitting: Internal Medicine

## 2016-06-06 ENCOUNTER — Encounter: Payer: Self-pay | Admitting: Internal Medicine

## 2016-06-06 ENCOUNTER — Ambulatory Visit: Payer: Medicare HMO | Admitting: Occupational Therapy

## 2016-06-06 VITALS — BP 114/70 | HR 78 | Temp 98.2°F | Wt 184.0 lb

## 2016-06-06 DIAGNOSIS — R278 Other lack of coordination: Secondary | ICD-10-CM | POA: Diagnosis not present

## 2016-06-06 DIAGNOSIS — G20 Parkinson's disease: Secondary | ICD-10-CM

## 2016-06-06 DIAGNOSIS — N281 Cyst of kidney, acquired: Secondary | ICD-10-CM | POA: Insufficient documentation

## 2016-06-06 DIAGNOSIS — M6281 Muscle weakness (generalized): Secondary | ICD-10-CM

## 2016-06-06 DIAGNOSIS — R2681 Unsteadiness on feet: Secondary | ICD-10-CM

## 2016-06-06 DIAGNOSIS — R490 Dysphonia: Secondary | ICD-10-CM | POA: Diagnosis not present

## 2016-06-06 DIAGNOSIS — I2692 Saddle embolus of pulmonary artery without acute cor pulmonale: Secondary | ICD-10-CM | POA: Diagnosis not present

## 2016-06-06 DIAGNOSIS — R262 Difficulty in walking, not elsewhere classified: Secondary | ICD-10-CM | POA: Diagnosis not present

## 2016-06-06 NOTE — Assessment & Plan Note (Signed)
He is not excited about invasive work up for this Discussed considering ultrasound to see if more information is gotten from this--but he is not sure he would proceed with invasive procedure regardless

## 2016-06-06 NOTE — Patient Instructions (Signed)
Please let me know if you pass any more blood in the urine.

## 2016-06-06 NOTE — Assessment & Plan Note (Signed)
Doing well with this Probably needs life long anticoagulation--- recommended sticking with the eliquis

## 2016-06-06 NOTE — Assessment & Plan Note (Signed)
Starting OT and speech for his voice issues

## 2016-06-06 NOTE — Progress Notes (Signed)
Subjective:    Patient ID: Walter [name], male    DOB: [birth year], 83 y.o.   MRN: [id number]  HPI Here for follow up of pulmonary embolism and other medical problems  He is concerned about the price of eliquis Asks about change to coumadin Discussed-- I don't recommend any change  Has been taking B12 I recommended he stay on it  Parkinson's seems okay Just started rehab at [facility]--- OT and speech (referred by Dr Tat) His biggest decline has been in his voice  Reviewed the renal cyst He had decided not to pursue this any further Discussed this  Current Outpatient Prescriptions on File Prior to Visit  Medication Sig Dispense Refill  . amLODipine (NORVASC) 5 MG tablet TAKE 1 TABLET EVERY DAY 90 tablet 3  . carbidopa-levodopa (SINEMET IR) 25-100 MG tablet TAKE 1 TABLET THREE TIMES DAILY 270 tablet 1  . Cholecalciferol (VITAMIN D) 2000 UNITS CAPS Take by mouth daily.    Marland Kitchen ELIQUIS 5 MG TABS tablet TAKE 1 TABLET TWICE DAILY 60 tablet 0  . levothyroxine (SYNTHROID, LEVOTHROID) 112 MCG tablet TAKE 1 TABLET EVERY DAY BEFORE BREAKFAST 90 tablet 2  . polyethylene glycol (MIRALAX / GLYCOLAX) packet Take 17 g by mouth daily as needed.     . vitamin B-12 (CYANOCOBALAMIN) 1000 MCG tablet Take 1,000 mcg by mouth daily.     No current facility-administered medications on file prior to visit.     Allergies  Allergen Reactions  . Quinolones     tendonitis    Past Medical History:  Diagnosis Date  . ARF (acute renal failure) ([place]) 2011   after TKR  . BPH (benign prostatic hypertrophy)   . DVT (deep venous thrombosis) ([place]) 1998   after left knee arthroscopy  . Hx of colonoscopy 2000  . Hypertension   . Hypothyroid 2007   postop from thyroidectomy (cancer scare)  . Kidney stones    recurrent  . Oral cancer ([place]) 2004   graft from left thigh  . Osteoarthritis of both knees   . Parkinson's disease ([place])   . Tendonitis 2012   from quinolone  . Toe amputation status ([place]) 2010     Past Surgical History:  Procedure Laterality Date  . APPENDECTOMY    . CHOLECYSTECTOMY    . HERNIA REPAIR    . LITHOTRIPSY  2009/2010   then stent and cystoscopic removal 2014  . Oral cancer removed Right 2004  . REPAIR ZENKER'S DIVERTICULA  2013  . THYROIDECTOMY  2007  . TOE AMPUTATION Right 2010   badly overlapping other toe  . TOTAL KNEE ARTHROPLASTY Left   . TRANSURETHRAL RESECTION OF PROSTATE  2011  . UMBILICAL HERNIA REPAIR  2007    Family History  Problem Relation Age of Onset  . Dementia Mother   . Stroke Father   . Pulmonary disease Brother   . Heart disease Neg Hx   . Diabetes Neg Hx     Social History   Social History  . Marital status: Married    Spouse name: N/A  . Number of children: 3  . Years of education: N/A   Occupational History  . 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 Smoking status: Former Smoker    Types: Cigarettes    Quit date: 04/27/2002  . Smokeless tobacco: Never Used  . Alcohol use 0.0 oz/week     Comment: one drink daily (wine or scotch)  .  Drug use: No  . Sexual activity: Not on file   Other Topics Concern  . Not on file   Social History Narrative   Son works for [employer]   1 daughter in [place], other in [place]      Has living will   Wife, then son, would be health care POA   He isn't sure about DNR--will accept resuscitation for now   No tube feeds if cognitively unaware   Review of Systems Prefers no EGD or colonoscopy either Appetite is good Weight is stable No problems where toe amputation was---trying to work on strength and flexibility due to changes in walking    Objective:   Physical Exam  Constitutional: He appears well-nourished. No distress.  Neck: No thyromegaly present.  Cardiovascular: Normal rate, regular rhythm and normal heart sounds.  Exam reveals no gallop.   No murmur heard. Faint pedal pulses  Pulmonary/Chest:  Effort normal and breath sounds normal. No respiratory distress. He has no wheezes. He has no rales.  Musculoskeletal: He exhibits no edema.  Right 2nd toe amputation site is clean and well healed  Lymphadenopathy:    He has no cervical adenopathy.  Psychiatric: He has a normal mood and affect. His behavior is normal.          Assessment & Plan:

## 2016-06-06 NOTE — Progress Notes (Signed)
Pre visit review using our clinic review tool, if applicable. No additional management support is needed unless otherwise documented below in the visit note. 

## 2016-06-07 ENCOUNTER — Encounter: Payer: Self-pay | Admitting: Occupational Therapy

## 2016-06-07 ENCOUNTER — Ambulatory Visit: Payer: Medicare HMO | Admitting: Occupational Therapy

## 2016-06-07 ENCOUNTER — Ambulatory Visit: Payer: Medicare HMO | Admitting: Internal Medicine

## 2016-06-07 ENCOUNTER — Encounter: Payer: Medicare HMO | Admitting: Speech Pathology

## 2016-06-07 DIAGNOSIS — R2681 Unsteadiness on feet: Secondary | ICD-10-CM

## 2016-06-07 DIAGNOSIS — M6281 Muscle weakness (generalized): Secondary | ICD-10-CM | POA: Diagnosis not present

## 2016-06-07 DIAGNOSIS — R262 Difficulty in walking, not elsewhere classified: Secondary | ICD-10-CM | POA: Diagnosis not present

## 2016-06-07 DIAGNOSIS — R278 Other lack of coordination: Secondary | ICD-10-CM | POA: Diagnosis not present

## 2016-06-07 DIAGNOSIS — R490 Dysphonia: Secondary | ICD-10-CM | POA: Diagnosis not present

## 2016-06-07 NOTE — Therapy (Signed)
[place] MAIN [facility] SERVICES [address] [place], [place], [address] Phone: [phone]   Fax:  [phone]  Occupational Therapy Treatment  Patient Details  Name: Walter [name] MRN: [id number] Date of Birth: Dec 02, 1931 Referring Provider: Tat  Encounter Date: 06/06/2016      OT End of Session - 06/07/16 2010    Visit Number 2   Number of Visits 17   Date for OT Re-Evaluation 07/08/16   Authorization Type Medicare G code 2   OT Start Time 1001   OT Stop Time 1100   OT Time Calculation (min) 59 min   Activity Tolerance Patient tolerated treatment well   Behavior During Therapy [facility] for tasks assessed/performed      Past Medical History:  Diagnosis Date  . ARF (acute renal failure) ([place]) 2011   after TKR  . BPH (benign prostatic hypertrophy)   . DVT (deep venous thrombosis) ([place]) 1998   after left knee arthroscopy  . Hx of colonoscopy 2000  . Hypertension   . Hypothyroid 2007   postop from thyroidectomy (cancer scare)  . Kidney stones    recurrent  . Oral cancer ([place]) 2004   graft from left thigh  . Osteoarthritis of both knees   . Parkinson's disease ([place])   . Tendonitis 2012   from quinolone  . Toe amputation status ([place]) 2010    Past Surgical History:  Procedure Laterality Date  . APPENDECTOMY    . CHOLECYSTECTOMY    . HERNIA REPAIR    . LITHOTRIPSY  2009/2010   then stent and cystoscopic removal 2014  . Oral cancer removed Right 2004  . REPAIR ZENKER'S DIVERTICULA  2013  . THYROIDECTOMY  2007  . TOE AMPUTATION Right 2010   badly overlapping other toe  . TOTAL KNEE ARTHROPLASTY Left   . TRANSURETHRAL RESECTION OF PROSTATE  2011  . UMBILICAL HERNIA REPAIR  2007    There were no vitals filed for this visit.      Subjective Assessment - 06/07/16 2009    Subjective  Patient reports he is ready to get started with therapy but is worried a bit since he hasn't been as active in the last few months since having a  blood clot.    Pertinent History Patient was diagnosed with Parkinson's disease about 1 year ago, reports he also was diagnosed with tendonitis in bilateral lower extremities, recently had blood clots in his lung 03-03-16, right foot with previous second toe removal.  He also has chronic back pain.     Patient Stated Goals Patient reports he would like to be able to play golf again, take care of himself, be active.    Currently in Pain? No/denies   Pain Score 0-No pain                      OT Treatments/Exercises (OP) - 06/07/16 2039      Neurological Re-education Exercises   Other Exercises 1 Patient seen for initial instruction of LSVT BIG exercises: LSVT Daily Session Maximal Daily Exercises: Sustained movements are designed to rescale the amplitude of movement output for generalization to daily functional activities. Performed as follows for 1 set of 10 repetitions each: Multi directional sustained movements- 1) Floor to ceiling, 2) Side to side. Multi directional Repetitive movements performed in standing and are designed to provide retraining effort needed for sustained muscle activation in tasks Performed as follows: 3) Step and reach forward, 4) Step and Reach Backwards,  5) Step and reach sideways, 6) Rock and reach forward/backward, 7) Rock and reach sideways. Moderate Verbal cues and therapist demo with all exercises, guiding of arms and CGA.  Sit to stand from mat table on lowest setting with cues for weight shift, technique for 10 reps for 1 set, CGA.   Patient performing reciprocal stepping exercise with CGA and cues for 10 reps each foot, stair negotiation 4 steps for 5 reps each, cues for big turns, CGA.  Patient seen for balance testing with BERG balance test, scored 48/56.   Other Exercises 2 Patient seen for functional mobility tasks with cues for amplitude of gait and reciprocal arm swing for 2 sets of 250 feet with SBA.                OT Education - 06/07/16  2010    Education provided Yes   Education Details LSVT BIG maximal daily exercises   Person(s) Educated Patient   Methods Explanation;Demonstration;Verbal cues   Comprehension Verbal cues required;Returned demonstration;Verbalized understanding             OT Long Term Goals - 05/31/16 2110      OT LONG TERM GOAL #1   Title Patient will improve gait speed and endurance and be able to walk 1200 feet in 6 minutes to negotiate around the home and community safely in 4 weeks   Baseline 1040 feet at evaluation   Time 4   Period Weeks   Status New     OT LONG TERM GOAL #2   Title Patient will complete HEP for maximal daily exercises with modified independence in 4 weeks   Baseline no current home program at eval   Time 4   Period Weeks   Status New     OT LONG TERM GOAL #3   Title Patient will transfer from sit to stand without the use of arms safely and independently from a variety of chairs/surfaces in 4 weeks.    Baseline difficulty from low surfaces   Time 4   Period Weeks   Status New     OT LONG TERM GOAL #4   Title Patient will complete basic self care tasks with modified independence with good speed in order to be on time for all appointments.    Baseline patient slow to complete tasks and occasionally requires assist to meet deadlines/appts.   Time 4   Period Weeks   Status New     OT LONG TERM GOAL #5   Title Patient will demonstrate improved grip strength right hand by 5# to open jars and containers with modified independence.    Baseline difficulty with opening jars and containers at eval   Time 4   Period Weeks   Status New               Plan - 06/07/16 2011    Clinical Impression Statement Patient seen this date for initial instruction of maximal daily exercises of LSVT BIG program, he required moderate verbal cues, CGA and therapist demo and instruction to complete exercises, rest breaks as needed.  Patient seen for functional mobility tasks with  moderate to maximal cues for ampltiude of steps and reciprocal arm swing.  Continue to work towards instruction of maximal daily exercises to become proficient in performance.    Rehab Potential Good   Clinical Impairments Affecting Rehab Potential positive:  motivation, family support. Negative: progressive disease process, medical co morbidities   OT Frequency 4x / week  OT Duration 4 weeks   OT Treatment/Interventions Self-care/ADL training;DME and/or AE instruction;Patient/family education;Gait Training;Therapeutic exercises;Balance training;Therapeutic exercise;Stair Training;Therapeutic activities;Neuromuscular education;Functional Mobility Training;Manual Therapy   Consulted and Agree with Plan of Care Patient      Patient will benefit from skilled therapeutic intervention in order to improve the following deficits and impairments:  Abnormal gait, Decreased coordination, Decreased range of motion, Difficulty walking, Improper body mechanics, Decreased endurance, Decreased activity tolerance, Decreased balance, Impaired UE functional use, Pain, Decreased mobility, Decreased strength, Decreased knowledge of use of DME  Visit Diagnosis: Difficulty in walking, not elsewhere classified  Muscle weakness (generalized)  Other lack of coordination  Unsteadiness on feet    Problem List Patient Active Problem List   Diagnosis Date Noted  . Renal cyst, right 06/06/2016  . Deep vein thrombosis (DVT) of femoral vein of right lower extremity ([place]) 03/05/2016  . Elevated troponin 03/05/2016  . CKD (chronic kidney disease) stage 3, GFR 30-59 ml/min 03/05/2016  . Acute saddle pulmonary embolism without acute cor pulmonale (HCC) 03/03/2016  . Vitamin B12 deficiency 09/09/2014  . Preventative health care 08/04/2014  . Parkinson's disease ([place]) 08/04/2014  . Esophageal diverticulum 01/01/2014  . Actinic keratosis 07/31/2013  . Hypertension   . Osteoarthritis of both knees   . Toe amputation  status ([place])   . Benign prostatic hyperplasia   . Kidney stones   . Hypothyroid   . Tendonitis    Adithya Difrancesco Oneita Jolly, OTR/L, CLT  Josedejesus Marcum 06/07/2016, 8:47 PM  [place] MAIN [facility] SERVICES [address] [place], [place], [address] Phone: [phone]   Fax:  [phone]  Name: Walter [name] MRN: [id number] Date of Birth: Oct 08, 1931

## 2016-06-08 ENCOUNTER — Encounter: Payer: Medicare HMO | Admitting: Speech Pathology

## 2016-06-08 ENCOUNTER — Ambulatory Visit: Payer: Medicare HMO | Admitting: Occupational Therapy

## 2016-06-08 DIAGNOSIS — R278 Other lack of coordination: Secondary | ICD-10-CM

## 2016-06-08 DIAGNOSIS — R2681 Unsteadiness on feet: Secondary | ICD-10-CM

## 2016-06-08 DIAGNOSIS — R490 Dysphonia: Secondary | ICD-10-CM | POA: Diagnosis not present

## 2016-06-08 DIAGNOSIS — R262 Difficulty in walking, not elsewhere classified: Secondary | ICD-10-CM

## 2016-06-08 DIAGNOSIS — M6281 Muscle weakness (generalized): Secondary | ICD-10-CM

## 2016-06-09 ENCOUNTER — Ambulatory Visit: Payer: Medicare HMO | Admitting: Occupational Therapy

## 2016-06-09 ENCOUNTER — Encounter: Payer: Medicare HMO | Admitting: Speech Pathology

## 2016-06-09 DIAGNOSIS — R262 Difficulty in walking, not elsewhere classified: Secondary | ICD-10-CM | POA: Diagnosis not present

## 2016-06-09 DIAGNOSIS — R490 Dysphonia: Secondary | ICD-10-CM | POA: Diagnosis not present

## 2016-06-09 DIAGNOSIS — M6281 Muscle weakness (generalized): Secondary | ICD-10-CM

## 2016-06-09 DIAGNOSIS — R278 Other lack of coordination: Secondary | ICD-10-CM | POA: Diagnosis not present

## 2016-06-09 DIAGNOSIS — R2681 Unsteadiness on feet: Secondary | ICD-10-CM

## 2016-06-11 ENCOUNTER — Encounter: Payer: Self-pay | Admitting: Occupational Therapy

## 2016-06-11 NOTE — Therapy (Signed)
[place] MAIN [facility] SERVICES [address] [place], [place], [address] Phone: [phone]   Fax:  [phone]  Occupational Therapy Treatment  Patient Details  Name: Walter [name] MRN: [id number] Date of Birth: 10/21/1931 Referring Provider: Tat  Encounter Date: 06/09/2016      OT End of Session - 06/11/16 1301    Visit Number 5   Number of Visits 17   Date for OT Re-Evaluation 07/08/16   Authorization Type Medicare G code 5   OT Start Time 1000   OT Stop Time 1100   OT Time Calculation (min) 60 min      Past Medical History:  Diagnosis Date  . ARF (acute renal failure) ([place]) 2011   after TKR  . BPH (benign prostatic hypertrophy)   . DVT (deep venous thrombosis) ([place]) 1998   after left knee arthroscopy  . Hx of colonoscopy 2000  . Hypertension   . Hypothyroid 2007   postop from thyroidectomy (cancer scare)  . Kidney stones    recurrent  . Oral cancer ([place]) 2004   graft from left thigh  . Osteoarthritis of both knees   . Parkinson's disease ([place])   . Tendonitis 2012   from quinolone  . Toe amputation status ([place]) 2010    Past Surgical History:  Procedure Laterality Date  . APPENDECTOMY    . CHOLECYSTECTOMY    . HERNIA REPAIR    . LITHOTRIPSY  2009/2010   then stent and cystoscopic removal 2014  . Oral cancer removed Right 2004  . REPAIR ZENKER'S DIVERTICULA  2013  . THYROIDECTOMY  2007  . TOE AMPUTATION Right 2010   badly overlapping other toe  . TOTAL KNEE ARTHROPLASTY Left   . TRANSURETHRAL RESECTION OF PROSTATE  2011  . UMBILICAL HERNIA REPAIR  2007    There were no vitals filed for this visit.      Subjective Assessment - 06/11/16 1257    Subjective  Patient reports he has company coming this weekend.  Discussed the need to perform his exercises twice a day despite having company present.    Pertinent History Patient was diagnosed with Parkinson's disease about 1 year ago, reports he also was diagnosed with  tendonitis in bilateral lower extremities, recently had blood clots in his lung 03-03-16, right foot with previous second toe removal.  He also has chronic back pain.     Patient Stated Goals Patient reports he would like to be able to play golf again, take care of himself, be active.    Currently in Pain? No/denies                      OT Treatments/Exercises (OP) - 06/11/16 1258      Neurological Re-education Exercises   Other Exercises 1 Patient seen for instruction of LSVT BIG exercises: LSVT Daily Session Maximal Daily Exercises: Sustained movements are designed to rescale the amplitude of movement output for generalization to daily functional activities. Performed as follows for 1 set of 10 repetitions each: Multi directional sustained movements- 1) Floor to ceiling, 2) Side to side. Multi directional Repetitive movements performed in standing and are designed to provide retraining effort needed for sustained muscle activation in tasks Performed as follows: 3) Step and reach forward, 4) Step and Reach Backwards, 5) Step and reach sideways, 6) Rock and reach forward/backward, 7) Rock and reach sideways. Moderate Verbal cues with all exercises and tactile cues for rock and reach sideways (twist). Sit to  stand from mat table on lowest setting with cues for weight shift, technique for 10 reps for 1 set, CGA. Patient performing reciprocal stepping exercise with CGA and cues for 10 reps each foot, stair negotiation 4 steps for 5 reps each, cues for big turns, CGA. Discussed formulation of functional component tasks, patient to come with list for next visit of items he feels he has the most difficulty with.  He has indicated looking over his right shoulder to see oncoming cars during driving and switching lanes, sit to stand, managing curbs.     Other Exercises 2 Patient seen for functional mobility tasks with cues for amplitude of gait and reciprocal arm swing for 2 sets of 325 feet with SBA  and moderate verbal cues for step height and length especially when patient becomes distracted.                 OT Education - 06/11/16 1301    Education provided Yes   Education Details maximal daily exercises in adapted version for home.    Person(s) Educated Patient   Methods Explanation;Demonstration;Verbal cues;Handout   Comprehension Verbal cues required;Returned demonstration;Verbalized understanding             OT Long Term Goals - 06/11/16 1303      OT LONG TERM GOAL #1   Title Patient will improve gait speed and endurance and be able to walk 1200 feet in 6 minutes to negotiate around the home and community safely in 4 weeks   Baseline 1040 feet at evaluation   Time 4   Period Weeks   Status On-going     OT LONG TERM GOAL #2   Title Patient will complete HEP for maximal daily exercises with modified independence in 4 weeks   Baseline no current home program at eval   Time 4   Period Weeks   Status On-going     OT LONG TERM GOAL #3   Title Patient will transfer from sit to stand without the use of arms safely and independently from a variety of chairs/surfaces in 4 weeks.    Baseline difficulty from low surfaces   Time 4   Period Weeks   Status On-going     OT LONG TERM GOAL #4   Title Patient will complete basic self care tasks with modified independence with good speed in order to be on time for all appointments.    Baseline patient slow to complete tasks and occasionally requires assist to meet deadlines/appts.   Time 4   Period Weeks   Status On-going     OT LONG TERM GOAL #5   Title Patient will demonstrate improved grip strength right hand by 5# to open jars and containers with modified independence.    Baseline difficulty with opening jars and containers at eval   Time 4   Period Weeks   Status On-going               Plan - 06/11/16 1302    Clinical Impression Statement Issued maximal daily exercises for home program in adapted  version with use of chair and patient educated on both standard and adapted in clinic today.  He continues to work towards proper form, technique and BIG movements as well as functional mobility tasks utilizing BIG principles.  Will recheck status next session to see how home exercises went and make adjustments as needed.    Rehab Potential Good   Clinical Impairments Affecting Rehab Potential positive:  motivation, family  support. Negative: progressive disease process, medical co morbidities   OT Frequency 4x / week   OT Duration 4 weeks   OT Treatment/Interventions Self-care/ADL training;DME and/or AE instruction;Patient/family education;Gait Training;Therapeutic exercises;Balance training;Therapeutic exercise;Stair Training;Therapeutic activities;Neuromuscular education;Functional Mobility Training;Manual Therapy   Consulted and Agree with Plan of Care Patient      Patient will benefit from skilled therapeutic intervention in order to improve the following deficits and impairments:  Abnormal gait, Decreased coordination, Decreased range of motion, Difficulty walking, Improper body mechanics, Decreased endurance, Decreased activity tolerance, Decreased balance, Impaired UE functional use, Pain, Decreased mobility, Decreased strength, Decreased knowledge of use of DME  Visit Diagnosis: Difficulty in walking, not elsewhere classified  Muscle weakness (generalized)  Other lack of coordination  Unsteadiness on feet    Problem List Patient Active Problem List   Diagnosis Date Noted  . Renal cyst, right 06/06/2016  . Deep vein thrombosis (DVT) of femoral vein of right lower extremity ([place]) 03/05/2016  . Elevated troponin 03/05/2016  . CKD (chronic kidney disease) stage 3, GFR 30-59 ml/min 03/05/2016  . Acute saddle pulmonary embolism without acute cor pulmonale (HCC) 03/03/2016  . Vitamin B12 deficiency 09/09/2014  . Preventative health care 08/04/2014  . Parkinson's disease ([place])  08/04/2014  . Esophageal diverticulum 01/01/2014  . Actinic keratosis 07/31/2013  . Hypertension   . Osteoarthritis of both knees   . Toe amputation status ([place])   . Benign prostatic hyperplasia   . Kidney stones   . Hypothyroid   . Tendonitis    Hana Trippett Oneita Jolly, OTR/L, CLT  Kaydence Baba 06/11/2016, 1:04 PM  [place] MAIN [facility] SERVICES [address] [place], [place], [address] Phone: [phone]   Fax:  [phone]  Name: Arafat Cocuzza MRN: [id number] Date of Birth: Sep 27, 1931

## 2016-06-11 NOTE — Therapy (Signed)
[place] MAIN [facility] SERVICES [address] [place], [place], [address] Phone: [phone]   Fax:  [phone]  Occupational Therapy Treatment  Patient Details  Name: Walter [name] MRN: [id number] Date of Birth: [birth year] Referring Provider: Tat  Encounter Date: 06/08/2016      OT End of Session - 06/11/16 1252    Visit Number 4   Number of Visits 17   Date for OT Re-Evaluation 07/08/16   Authorization Type Medicare G code 4   OT Start Time 1001   OT Stop Time 1100   OT Time Calculation (min) 59 min   Activity Tolerance Patient tolerated treatment well   Behavior During Therapy [facility] for tasks assessed/performed      Past Medical History:  Diagnosis Date  . ARF (acute renal failure) ([place]) 2011   after TKR  . BPH (benign prostatic hypertrophy)   . DVT (deep venous thrombosis) ([place]) 1998   after left knee arthroscopy  . Hx of colonoscopy 2000  . Hypertension   . Hypothyroid 2007   postop from thyroidectomy (cancer scare)  . Kidney stones    recurrent  . Oral cancer ([place]) 2004   graft from left thigh  . Osteoarthritis of both knees   . Parkinson's disease ([place])   . Tendonitis 2012   from quinolone  . Toe amputation status ([place]) 2010    Past Surgical History:  Procedure Laterality Date  . APPENDECTOMY    . CHOLECYSTECTOMY    . HERNIA REPAIR    . LITHOTRIPSY  2009/2010   then stent and cystoscopic removal 2014  . Oral cancer removed Right 2004  . REPAIR ZENKER'S DIVERTICULA  2013  . THYROIDECTOMY  2007  . TOE AMPUTATION Right 2010   badly overlapping other toe  . TOTAL KNEE ARTHROPLASTY Left   . TRANSURETHRAL RESECTION OF PROSTATE  2011  . UMBILICAL HERNIA REPAIR  2007    There were no vitals filed for this visit.      Subjective Assessment - 06/11/16 1248    Subjective  Patient reports he can tell a difference in just performing exercises the last few days, is still pleased with pace and does not feel overwhelmed  with program.    Pertinent History Patient was diagnosed with Parkinson's disease about 1 year ago, reports he also was diagnosed with tendonitis in bilateral lower extremities, recently had blood clots in his lung 03-03-16, right foot with previous second toe removal.  He also has chronic back pain.     Patient Stated Goals Patient reports he would like to be able to play golf again, take care of himself, be active.    Currently in Pain? No/denies   Pain Score 0-No pain                      OT Treatments/Exercises (OP) - 06/11/16 1249      Neurological Re-education Exercises   Other Exercises 1 Patient seen for instruction of LSVT BIG exercises: LSVT Daily Session Maximal Daily Exercises: Sustained movements are designed to rescale the amplitude of movement output for generalization to daily functional activities. Performed as follows for 1 set of 10 repetitions each: Multi directional sustained movements- 1) Floor to ceiling, 2) Side to side. Multi directional Repetitive movements performed in standing and are designed to provide retraining effort needed for sustained muscle activation in tasks Performed as follows: 3) Step and reach forward, 4) Step and Reach Backwards, 5) Step and reach sideways,  6) Rock and reach forward/backward, 7) Rock and reach sideways. Moderate Verbal cues with all exercises and tactile cues for rock and reach sideways (twist). Sit to stand from mat table on lowest setting with cues for weight shift, technique for 10 reps for 1 set, CGA. Patient performing reciprocal stepping exercise with CGA and cues for 10 reps each foot, stair negotiation 4 steps for 5 reps each, cues for big turns, CGA.    Other Exercises 2 Patient seen for functional mobility tasks with cues for amplitude of gait and reciprocal arm swing for 1 set of 600 feet with SBA and moderate verbal cues for step height and length especially when patient becomes distracted.                 OT  Education - 06/11/16 1251    Education provided Yes   Education Details daily exercises, shifting weight with backwards step    Person(s) Educated Patient   Methods Explanation;Demonstration;Tactile cues;Verbal cues   Comprehension Verbal cues required;Returned demonstration;Verbalized understanding;Tactile cues required;Need further instruction             OT Long Term Goals - 05/31/16 2110      OT LONG TERM GOAL #1   Title Patient will improve gait speed and endurance and be able to walk 1200 feet in 6 minutes to negotiate around the home and community safely in 4 weeks   Baseline 1040 feet at evaluation   Time 4   Period Weeks   Status New     OT LONG TERM GOAL #2   Title Patient will complete HEP for maximal daily exercises with modified independence in 4 weeks   Baseline no current home program at eval   Time 4   Period Weeks   Status New     OT LONG TERM GOAL #3   Title Patient will transfer from sit to stand without the use of arms safely and independently from a variety of chairs/surfaces in 4 weeks.    Baseline difficulty from low surfaces   Time 4   Period Weeks   Status New     OT LONG TERM GOAL #4   Title Patient will complete basic self care tasks with modified independence with good speed in order to be on time for all appointments.    Baseline patient slow to complete tasks and occasionally requires assist to meet deadlines/appts.   Time 4   Period Weeks   Status New     OT LONG TERM GOAL #5   Title Patient will demonstrate improved grip strength right hand by 5# to open jars and containers with modified independence.    Baseline difficulty with opening jars and containers at eval   Time 4   Period Weeks   Status New               Plan - 06/11/16 1252    Clinical Impression Statement Patient continues to demonstrate decreased balance at times with exercises and requires CGA to occasional min assist.  He will likely need to start with adapted  exercises at home with use of a chair in order to perform exercises safely and progress from there.  Will plan to perform standard exercises with therapist and will issued adapted version for home next date.  He reponds well to cues for size and length of steps with functional mobility but as soon as he is distracted, he resorts back to pattern of short shuffled gait.  He is aware  of this and feels it is "easier to shuffle when feeling off balance".  Continue to work towards improving these patterns to affect amplitude of gait and decrease risk of falls.    Rehab Potential Good   Clinical Impairments Affecting Rehab Potential positive:  motivation, family support. Negative: progressive disease process, medical co morbidities   OT Frequency 4x / week   OT Duration 4 weeks   OT Treatment/Interventions Self-care/ADL training;DME and/or AE instruction;Patient/family education;Gait Training;Therapeutic exercises;Balance training;Therapeutic exercise;Stair Training;Therapeutic activities;Neuromuscular education;Functional Mobility Training;Manual Therapy   Consulted and Agree with Plan of Care Patient      Patient will benefit from skilled therapeutic intervention in order to improve the following deficits and impairments:  Abnormal gait, Decreased coordination, Decreased range of motion, Difficulty walking, Improper body mechanics, Decreased endurance, Decreased activity tolerance, Decreased balance, Impaired UE functional use, Pain, Decreased mobility, Decreased strength, Decreased knowledge of use of DME  Visit Diagnosis: Difficulty in walking, not elsewhere classified  Muscle weakness (generalized)  Other lack of coordination  Unsteadiness on feet    Problem List Patient Active Problem List   Diagnosis Date Noted  . Renal cyst, right 06/06/2016  . Deep vein thrombosis (DVT) of femoral vein of right lower extremity ([place]) 03/05/2016  . Elevated troponin 03/05/2016  . CKD (chronic kidney  disease) stage 3, GFR 30-59 ml/min 03/05/2016  . Acute saddle pulmonary embolism without acute cor pulmonale (HCC) 03/03/2016  . Vitamin B12 deficiency 09/09/2014  . Preventative health care 08/04/2014  . Parkinson's disease ([place]) 08/04/2014  . Esophageal diverticulum 01/01/2014  . Actinic keratosis 07/31/2013  . Hypertension   . Osteoarthritis of both knees   . Toe amputation status ([place])   . Benign prostatic hyperplasia   . Kidney stones   . Hypothyroid   . Tendonitis    Ruari Mudgett Oneita Jolly, OTR/L, CLT  Tressie Ragin 06/11/2016, 12:56 PM  [place] MAIN [facility] SERVICES [address] [place], [place], [address] Phone: [phone]   Fax:  [phone]  Name: Walter [name] MRN: [id number] Date of Birth: 08/01/1931

## 2016-06-11 NOTE — Therapy (Signed)
[place] MAIN [facility] SERVICES [address] [place], [place], [address] Phone: [phone]   Fax:  [phone]  Occupational Therapy Treatment  Patient Details  Name: Walter [name] MRN: [id number] Date of Birth: [birth year] Referring Provider: Tat  Encounter Date: 06/07/2016      OT End of Session - 06/11/16 8366    Visit Number 3   Number of Visits 17   Date for OT Re-Evaluation 07/08/16   Authorization Type Medicare G code 3   OT Start Time 1000   OT Stop Time 1100   OT Time Calculation (min) 60 min   Activity Tolerance Patient tolerated treatment well   Behavior During Therapy [facility] for tasks assessed/performed      Past Medical History:  Diagnosis Date  . ARF (acute renal failure) ([place]) 2011   after TKR  . BPH (benign prostatic hypertrophy)   . DVT (deep venous thrombosis) ([place]) 1998   after left knee arthroscopy  . Hx of colonoscopy 2000  . Hypertension   . Hypothyroid 2007   postop from thyroidectomy (cancer scare)  . Kidney stones    recurrent  . Oral cancer ([place]) 2004   graft from left thigh  . Osteoarthritis of both knees   . Parkinson's disease ([place])   . Tendonitis 2012   from quinolone  . Toe amputation status ([place]) 2010    Past Surgical History:  Procedure Laterality Date  . APPENDECTOMY    . CHOLECYSTECTOMY    . HERNIA REPAIR    . LITHOTRIPSY  2009/2010   then stent and cystoscopic removal 2014  . Oral cancer removed Right 2004  . REPAIR ZENKER'S DIVERTICULA  2013  . THYROIDECTOMY  2007  . TOE AMPUTATION Right 2010   badly overlapping other toe  . TOTAL KNEE ARTHROPLASTY Left   . TRANSURETHRAL RESECTION OF PROSTATE  2011  . UMBILICAL HERNIA REPAIR  2007    There were no vitals filed for this visit.      Subjective Assessment - 06/11/16 1241    Subjective  Patient reports he is a little sore from exercises yesterday but no pain.  Reports the pace has been good and feels it was not too much last date.     Pertinent History Patient was diagnosed with Parkinson's disease about 1 year ago, reports he also was diagnosed with tendonitis in bilateral lower extremities, recently had blood clots in his lung 03-03-16, right foot with previous second toe removal.  He also has chronic back pain.     Patient Stated Goals Patient reports he would like to be able to play golf again, take care of himself, be active.    Currently in Pain? No/denies   Pain Score 0-No pain                      OT Treatments/Exercises (OP) - 06/11/16 1242      Neurological Re-education Exercises   Other Exercises 1 Patient seen for instruction of LSVT BIG exercises: LSVT Daily Session Maximal Daily Exercises: Sustained movements are designed to rescale the amplitude of movement output for generalization to daily functional activities. Performed as follows for 1 set of 10 repetitions each: Multi directional sustained movements- 1) Floor to ceiling, 2) Side to side. Multi directional Repetitive movements performed in standing and are designed to provide retraining effort needed for sustained muscle activation in tasks Performed as follows: 3) Step and reach forward, 4) Step and Reach Backwards, 5) Step and  reach sideways, 6) Rock and reach forward/backward, 7) Rock and reach sideways. Moderate Verbal cues and therapist demo with all exercises, guiding of arms and CGA to min assist, one loss of balance slightly with self recovery. Sit to stand from mat table on lowest setting with cues for weight shift, technique for 10 reps for 1 set, CGA. Patient performing reciprocal stepping exercise with CGA and cues for 10 reps each foot, stair negotiation 4 steps for 5 reps each, cues for big turns, CGA.    Other Exercises 2 Patient seen for functional mobility tasks with cues for amplitude of gait and reciprocal arm swing for 2 sets of 300 feet with SBA and moderate verbal cues for step height and length.                 OT  Education - 06/11/16 1244    Education provided Yes   Education Details maximal daily exercises    Person(s) Educated Patient   Methods Explanation;Demonstration;Verbal cues   Comprehension Verbal cues required;Returned demonstration;Verbalized understanding             OT Long Term Goals - 05/31/16 2110      OT LONG TERM GOAL #1   Title Patient will improve gait speed and endurance and be able to walk 1200 feet in 6 minutes to negotiate around the home and community safely in 4 weeks   Baseline 1040 feet at evaluation   Time 4   Period Weeks   Status New     OT LONG TERM GOAL #2   Title Patient will complete HEP for maximal daily exercises with modified independence in 4 weeks   Baseline no current home program at eval   Time 4   Period Weeks   Status New     OT LONG TERM GOAL #3   Title Patient will transfer from sit to stand without the use of arms safely and independently from a variety of chairs/surfaces in 4 weeks.    Baseline difficulty from low surfaces   Time 4   Period Weeks   Status New     OT LONG TERM GOAL #4   Title Patient will complete basic self care tasks with modified independence with good speed in order to be on time for all appointments.    Baseline patient slow to complete tasks and occasionally requires assist to meet deadlines/appts.   Time 4   Period Weeks   Status New     OT LONG TERM GOAL #5   Title Patient will demonstrate improved grip strength right hand by 5# to open jars and containers with modified independence.    Baseline difficulty with opening jars and containers at eval   Time 4   Period Weeks   Status New               Plan - 06/11/16 1245    Clinical Impression Statement Patient seen for continued instruction on maximal daily exercises and continues to require CGA to min assist for completion.  Therapist also providing visual cues and demo of exercises in attempts to calibrate size of movement on a larger scale.   Continue to work towards goals to increase independence in daily tasks, improve balance, and impact functional mobility with gait patterns.    Rehab Potential Good   Clinical Impairments Affecting Rehab Potential positive:  motivation, family support. Negative: progressive disease process, medical co morbidities   OT Frequency 4x / week   OT Duration 4 weeks  OT Treatment/Interventions Self-care/ADL training;DME and/or AE instruction;Patient/family education;Gait Training;Therapeutic exercises;Balance training;Therapeutic exercise;Stair Training;Therapeutic activities;Neuromuscular education;Functional Mobility Training;Manual Therapy   Consulted and Agree with Plan of Care Patient      Patient will benefit from skilled therapeutic intervention in order to improve the following deficits and impairments:  Abnormal gait, Decreased coordination, Decreased range of motion, Difficulty walking, Improper body mechanics, Decreased endurance, Decreased activity tolerance, Decreased balance, Impaired UE functional use, Pain, Decreased mobility, Decreased strength, Decreased knowledge of use of DME  Visit Diagnosis: Difficulty in walking, not elsewhere classified  Muscle weakness (generalized)  Other lack of coordination  Unsteadiness on feet    Problem List Patient Active Problem List   Diagnosis Date Noted  . Renal cyst, right 06/06/2016  . Deep vein thrombosis (DVT) of femoral vein of right lower extremity ([place]) 03/05/2016  . Elevated troponin 03/05/2016  . CKD (chronic kidney disease) stage 3, GFR 30-59 ml/min 03/05/2016  . Acute saddle pulmonary embolism without acute cor pulmonale (HCC) 03/03/2016  . Vitamin B12 deficiency 09/09/2014  . Preventative health care 08/04/2014  . Parkinson's disease ([place]) 08/04/2014  . Esophageal diverticulum 01/01/2014  . Actinic keratosis 07/31/2013  . Hypertension   . Osteoarthritis of both knees   . Toe amputation status ([place])   . Benign prostatic  hyperplasia   . Kidney stones   . Hypothyroid   . Tendonitis    Reginae Wolfrey Colette Ribas 06/11/2016, 12:47 PM  [place] MAIN [facility] SERVICES [address] [place], [place], [address] Phone: [phone]   Fax:  [phone]  Name: Walter [name] MRN: [id number] Date of Birth: [birth year]

## 2016-06-13 ENCOUNTER — Ambulatory Visit: Payer: Medicare HMO | Admitting: Occupational Therapy

## 2016-06-13 ENCOUNTER — Encounter: Payer: Self-pay | Admitting: Speech Pathology

## 2016-06-13 ENCOUNTER — Ambulatory Visit: Payer: Medicare HMO | Admitting: Speech Pathology

## 2016-06-13 DIAGNOSIS — M6281 Muscle weakness (generalized): Secondary | ICD-10-CM

## 2016-06-13 DIAGNOSIS — R262 Difficulty in walking, not elsewhere classified: Secondary | ICD-10-CM

## 2016-06-13 DIAGNOSIS — R278 Other lack of coordination: Secondary | ICD-10-CM

## 2016-06-13 DIAGNOSIS — R490 Dysphonia: Secondary | ICD-10-CM

## 2016-06-13 DIAGNOSIS — R2681 Unsteadiness on feet: Secondary | ICD-10-CM | POA: Diagnosis not present

## 2016-06-13 NOTE — Therapy (Signed)
[place] MAIN [facility] SERVICES [address] [place], [place], [address] Phone: [phone]   Fax:  [phone]  Speech Language Pathology Treatment  Patient Details  Name: Walter [name] MRN: [id number] Date of Birth: [birth year] Referring Provider: Ludwig Clarks  Encounter Date: 06/13/2016      End of Session - 06/13/16 1307    Visit Number 2   Number of Visits 17   Date for SLP Re-Evaluation 07/01/16   SLP Start Time 1101   SLP Stop Time  4196   SLP Time Calculation (min) 56 min   Activity Tolerance Patient tolerated treatment well      Past Medical History:  Diagnosis Date  . ARF (acute renal failure) ([place]) 2011   after TKR  . BPH (benign prostatic hypertrophy)   . DVT (deep venous thrombosis) ([place]) 1998   after left knee arthroscopy  . Hx of colonoscopy 2000  . Hypertension   . Hypothyroid 2007   postop from thyroidectomy (cancer scare)  . Kidney stones    recurrent  . Oral cancer ([place]) 2004   graft from left thigh  . Osteoarthritis of both knees   . Parkinson's disease ([place])   . Tendonitis 2012   from quinolone  . Toe amputation status ([place]) 2010    Past Surgical History:  Procedure Laterality Date  . APPENDECTOMY    . CHOLECYSTECTOMY    . HERNIA REPAIR    . LITHOTRIPSY  2009/2010   then stent and cystoscopic removal 2014  . Oral cancer removed Right 2004  . REPAIR ZENKER'S DIVERTICULA  2013  . THYROIDECTOMY  2007  . TOE AMPUTATION Right 2010   badly overlapping other toe  . TOTAL KNEE ARTHROPLASTY Left   . TRANSURETHRAL RESECTION OF PROSTATE  2011  . UMBILICAL HERNIA REPAIR  2007    There were no vitals filed for this visit.      Subjective Assessment - 06/13/16 1306    Subjective Pt was pleasant and agreeable to treatment. Pt stated that he felt like his wife would like that he is speaking louder because she is hard of hearing.    Currently in Pain? No/denies               ADULT SLP TREATMENT -  06/13/16 0001      General Information   Behavior/Cognition Alert;Pleasant mood;Cooperative     Treatment Provided   Treatment provided Cognitive-Linquistic     Cognitive-Linquistic Treatment   Treatment focused on Voice;Other (comment)  LSVT LOUD   Skilled Treatment LSVT LOUD Protocol: Daily task #1 Maximum sustained "ah" - Average SPL - 85 dB; sustained for average of 8 seconds; Daily Task #2 Maximum fundamental frequency range; Highs: 15 high pitched "ah" given min verbal cues; Lows: 15 low pitched "ah" given min verbal cues. Daily Task #3 Maximum speech loudness drill with functional phrases. Average SPL - 75 dB. Hierarchal speech loudness drills: Read words and short phrases, 74 dB with min verbal cues. Off the cuff remarks, 71 dB improving to 75 dB with min verbal cues. Homework: Folder given and daily tasks explained.      Assessment / Recommendations / Plan   Plan Continue with current plan of care     Progression Toward Goals   Progression toward goals Progressing toward goals          SLP Education - 06/13/16 1306    Education provided Yes   Education Details LSVT Loud protocol and HEP  Person(s) Educated Patient   Methods Explanation   Comprehension Verbalized understanding;Verbal cues required            SLP Long Term Goals - 05/30/16 1237      SLP LONG TERM GOAL #1   Title The patient will complete Daily Tasks (Maximum duration "ah", High/Lows, and Functional Phrases) at average loudness of 80 dB and with loud, good quality voice.    Time 4   Period Weeks   Status New     SLP LONG TERM GOAL #2   Title The patient will complete Hierarchal Speech Loudness reading drills (words/phrases, sentences, and paragraph) at average 75 dB and with loud, good quality voice.     Time 4   Period Weeks   Status New     SLP LONG TERM GOAL #3   Title The patient will participate in conversation, maintaining average loudness of 75 dB and loud, good quality voice.   Time  4   Period Weeks   Status New     SLP LONG TERM GOAL #4   Title The patient will complete homework daily.   Time 4   Period Weeks   Status New          Plan - 06/13/16 1308    Clinical Impression Statement The patient is completing daily tasks and hierarchal speech drill tasks with loud, good quality voice given min SLP cues. Pt was beginning to generalize loudness into converational speech and also demonstrated some self-correction independently.   Speech Therapy Frequency 4x / week   Duration 4 weeks   Treatment/Interventions Patient/family education;Other (comment)  LSVT LOUD   Potential to Achieve Goals Good   Potential Considerations Ability to learn/carryover information;Co-morbidities;Cooperation/participation level;Previous level of function;Severity of impairments;Family/community support   SLP Home Exercise Plan LSVT-LOUD daily homework   Consulted and Agree with Plan of Care Patient      Patient will benefit from skilled therapeutic intervention in order to improve the following deficits and impairments:   Dysphonia    Problem List Patient Active Problem List   Diagnosis Date Noted  . Renal cyst, right 06/06/2016  . Deep vein thrombosis (DVT) of femoral vein of right lower extremity ([place]) 03/05/2016  . Elevated troponin 03/05/2016  . CKD (chronic kidney disease) stage 3, GFR 30-59 ml/min 03/05/2016  . Acute saddle pulmonary embolism without acute cor pulmonale (HCC) 03/03/2016  . Vitamin B12 deficiency 09/09/2014  . Preventative health care 08/04/2014  . Parkinson's disease ([place]) 08/04/2014  . Esophageal diverticulum 01/01/2014  . Actinic keratosis 07/31/2013  . Hypertension   . Osteoarthritis of both knees   . Toe amputation status ([place])   . Benign prostatic hyperplasia   . Kidney stones   . Hypothyroid   . Tendonitis     Beaumont,Peola Joynt 06/13/2016, 1:11 PM  [place] MAIN [facility] SERVICES [address]  [place], [place], [address] Phone: [phone]   Fax:  [phone]   Name: Walter [name] MRN: [id number] Date of Birth: 1931-03-26

## 2016-06-14 ENCOUNTER — Ambulatory Visit: Payer: Medicare HMO | Admitting: Speech Pathology

## 2016-06-14 ENCOUNTER — Ambulatory Visit: Payer: Medicare HMO | Admitting: Occupational Therapy

## 2016-06-14 ENCOUNTER — Encounter: Payer: Self-pay | Admitting: Speech Pathology

## 2016-06-14 DIAGNOSIS — R278 Other lack of coordination: Secondary | ICD-10-CM

## 2016-06-14 DIAGNOSIS — R262 Difficulty in walking, not elsewhere classified: Secondary | ICD-10-CM

## 2016-06-14 DIAGNOSIS — M6281 Muscle weakness (generalized): Secondary | ICD-10-CM | POA: Diagnosis not present

## 2016-06-14 DIAGNOSIS — R2681 Unsteadiness on feet: Secondary | ICD-10-CM

## 2016-06-14 DIAGNOSIS — R490 Dysphonia: Secondary | ICD-10-CM

## 2016-06-14 NOTE — Therapy (Signed)
[place] MAIN [facility] SERVICES [address] [place], [place], [address] Phone: [phone]   Fax:  [phone]  Speech Language Pathology Treatment  Patient Details  Name: Walter [name] MRN: [id number] Date of Birth: Sep 05, 1931 Referring Provider: Ludwig Clarks  Encounter Date: 06/14/2016      End of Session - 06/14/16 1509    Visit Number 3   Number of Visits 17   Date for SLP Re-Evaluation 07/01/16   SLP Start Time 1105   SLP Stop Time  1200   SLP Time Calculation (min) 55 min   Activity Tolerance Patient tolerated treatment well      Past Medical History:  Diagnosis Date  . ARF (acute renal failure) ([place]) 2011   after TKR  . BPH (benign prostatic hypertrophy)   . DVT (deep venous thrombosis) ([place]) 1998   after left knee arthroscopy  . Hx of colonoscopy 2000  . Hypertension   . Hypothyroid 2007   postop from thyroidectomy (cancer scare)  . Kidney stones    recurrent  . Oral cancer ([place]) 2004   graft from left thigh  . Osteoarthritis of both knees   . Parkinson's disease ([place])   . Tendonitis 2012   from quinolone  . Toe amputation status ([place]) 2010    Past Surgical History:  Procedure Laterality Date  . APPENDECTOMY    . CHOLECYSTECTOMY    . HERNIA REPAIR    . LITHOTRIPSY  2009/2010   then stent and cystoscopic removal 2014  . Oral cancer removed Right 2004  . REPAIR ZENKER'S DIVERTICULA  2013  . THYROIDECTOMY  2007  . TOE AMPUTATION Right 2010   badly overlapping other toe  . TOTAL KNEE ARTHROPLASTY Left   . TRANSURETHRAL RESECTION OF PROSTATE  2011  . UMBILICAL HERNIA REPAIR  2007    There were no vitals filed for this visit.      Subjective Assessment - 06/14/16 1341    Subjective Pt reported that his wife was "impressed" with his louder voice and that she hopes he can "keep it up."   Currently in Pain? No/denies               ADULT SLP TREATMENT - 06/14/16 0001      General Information    Behavior/Cognition Alert;Cooperative;Pleasant mood     Treatment Provided   Treatment provided Cognitive-Linquistic     Cognitive-Linquistic Treatment   Treatment focused on Voice  LSVT LOUD   Skilled Treatment LSVT LOUD Protocol: Daily task #1 Maximum sustained "ah" - Average SPL - 84 dB; sustained for average of 9 seconds; Daily Task #2 Maximum fundamental frequency range; Highs: 15 high pitched "ah" given min verbal cues; Lows: 15 low pitched "ah" independently. Daily Task #3 Maximum speech loudness drill with functional phrases. Average SPL - 74 dB w/min verbal cues. Hierarchal speech loudness drills: Read words and short phrases, 74 dB with min verbal cues. Off the cuff remarks, 67 dB improving to 74 dB with min verbal cues. Homework: Homework was completed and additional work given     Assessment / Recommendations / [place] with current plan of care     Progression Toward Goals   Progression toward goals Progressing toward goals          SLP Education - 06/14/16 1508    Education provided Yes   Education Details Practicing Loud voice at home in conversation   Person(s) Educated Patient   Methods Explanation  Comprehension Verbalized understanding;Verbal cues required            SLP Long Term Goals - 05/30/16 1237      SLP LONG TERM GOAL #1   Title The patient will complete Daily Tasks (Maximum duration "ah", High/Lows, and Functional Phrases) at average loudness of 80 dB and with loud, good quality voice.    Time 4   Period Weeks   Status New     SLP LONG TERM GOAL #2   Title The patient will complete Hierarchal Speech Loudness reading drills (words/phrases, sentences, and paragraph) at average 75 dB and with loud, good quality voice.     Time 4   Period Weeks   Status New     SLP LONG TERM GOAL #3   Title The patient will participate in conversation, maintaining average loudness of 75 dB and loud, good quality voice.   Time 4   Period Weeks    Status New     SLP LONG TERM GOAL #4   Title The patient will complete homework daily.   Time 4   Period Weeks   Status New          Plan - 06/14/16 1509    Clinical Impression Statement The patient is completing daily tasks and hierarchal speech drill tasks with loud, good quality voice given min SLP cues. Pt is beginning to generalize loudness into conversational speech and is demonstrating some self-correction and self-awareness of loudness.    Speech Therapy Frequency 4x / week   Duration 4 weeks   Treatment/Interventions Patient/family education;Other (comment);SLP instruction and feedback  LSVT LOUD   Potential to Achieve Goals Good   Potential Considerations Ability to learn/carryover information;Co-morbidities;Cooperation/participation level;Previous level of function;Severity of impairments;Family/community support   SLP Home Exercise Plan LSVT-LOUD daily homework   Consulted and Agree with Plan of Care Patient      Patient will benefit from skilled therapeutic intervention in order to improve the following deficits and impairments:   Dysphonia    Problem List Patient Active Problem List   Diagnosis Date Noted  . Renal cyst, right 06/06/2016  . Deep vein thrombosis (DVT) of femoral vein of right lower extremity ([place]) 03/05/2016  . Elevated troponin 03/05/2016  . CKD (chronic kidney disease) stage 3, GFR 30-59 ml/min 03/05/2016  . Acute saddle pulmonary embolism without acute cor pulmonale (HCC) 03/03/2016  . Vitamin B12 deficiency 09/09/2014  . Preventative health care 08/04/2014  . Parkinson's disease ([place]) 08/04/2014  . Esophageal diverticulum 01/01/2014  . Actinic keratosis 07/31/2013  . Hypertension   . Osteoarthritis of both knees   . Toe amputation status ([place])   . Benign prostatic hyperplasia   . Kidney stones   . Hypothyroid   . Tendonitis     [place],Maaran 06/14/2016, 3:12 PM  [place] MAIN [facility]  SERVICES [address] [place], [place], [address] Phone: [phone]   Fax:  [phone]   Name: Walter [name] MRN: [id number] Date of Birth: [birth year]

## 2016-06-15 ENCOUNTER — Ambulatory Visit: Payer: Medicare HMO | Attending: Neurology | Admitting: Occupational Therapy

## 2016-06-15 ENCOUNTER — Encounter: Payer: Self-pay | Admitting: Speech Pathology

## 2016-06-15 ENCOUNTER — Ambulatory Visit: Payer: Medicare HMO | Admitting: Speech Pathology

## 2016-06-15 DIAGNOSIS — R278 Other lack of coordination: Secondary | ICD-10-CM | POA: Insufficient documentation

## 2016-06-15 DIAGNOSIS — R490 Dysphonia: Secondary | ICD-10-CM | POA: Diagnosis not present

## 2016-06-15 DIAGNOSIS — R262 Difficulty in walking, not elsewhere classified: Secondary | ICD-10-CM | POA: Diagnosis not present

## 2016-06-15 DIAGNOSIS — R2681 Unsteadiness on feet: Secondary | ICD-10-CM | POA: Diagnosis not present

## 2016-06-15 DIAGNOSIS — M6281 Muscle weakness (generalized): Secondary | ICD-10-CM | POA: Diagnosis not present

## 2016-06-15 NOTE — Therapy (Signed)
[place] MAIN [facility] SERVICES [address] [place], [place], [address] Phone: [phone]   Fax:  [phone]  Speech Language Pathology Treatment  Patient Details  Name: Walter [name] MRN: [id number] Date of Birth: 09-03-1931 Referring Provider: Ludwig Clarks  Encounter Date: 06/15/2016      End of Session - 06/15/16 1229    Visit Number 4   Number of Visits 17   Date for SLP Re-Evaluation 07/01/16   SLP Start Time 57   SLP Stop Time  1155   SLP Time Calculation (min) 55 min   Activity Tolerance Patient tolerated treatment well      Past Medical History:  Diagnosis Date  . ARF (acute renal failure) ([place]) 2011   after TKR  . BPH (benign prostatic hypertrophy)   . DVT (deep venous thrombosis) ([place]) 1998   after left knee arthroscopy  . Hx of colonoscopy 2000  . Hypertension   . Hypothyroid 2007   postop from thyroidectomy (cancer scare)  . Kidney stones    recurrent  . Oral cancer ([place]) 2004   graft from left thigh  . Osteoarthritis of both knees   . Parkinson's disease ([place])   . Tendonitis 2012   from quinolone  . Toe amputation status ([place]) 2010    Past Surgical History:  Procedure Laterality Date  . APPENDECTOMY    . CHOLECYSTECTOMY    . HERNIA REPAIR    . LITHOTRIPSY  2009/2010   then stent and cystoscopic removal 2014  . Oral cancer removed Right 2004  . REPAIR ZENKER'S DIVERTICULA  2013  . THYROIDECTOMY  2007  . TOE AMPUTATION Right 2010   badly overlapping other toe  . TOTAL KNEE ARTHROPLASTY Left   . TRANSURETHRAL RESECTION OF PROSTATE  2011  . UMBILICAL HERNIA REPAIR  2007    There were no vitals filed for this visit.      Subjective Assessment - 06/15/16 1229    Subjective Pt reported that his wife was "impressed" with his louder voice and that she hopes he can "keep it up."   Currently in Pain? No/denies               ADULT SLP TREATMENT - 06/15/16 0001      General Information    Behavior/Cognition Alert;Cooperative;Pleasant mood   HPI Parkinson's disease     Treatment Provided   Treatment provided Cognitive-Linquistic     Pain Assessment   Pain Assessment No/denies pain     Cognitive-Linquistic Treatment   Treatment focused on Voice  LSVT LOUD   Skilled Treatment LSVT LOUD Protocol: Daily task #1 Maximum sustained "ah" - Average SPL - 84 dB; sustained for average of 9 seconds; Daily Task #2 Maximum fundamental frequency range; Highs: 15 high pitched "ah" independently; Lows: 15 low pitched "ah" independently. Daily Task #3 Maximum speech loudness drill with functional phrases. Average SPL - 74 dB w/min verbal cues. Hierarchal speech loudness drills: Read sentences, 74 dB with min verbal cues. Off the cuff remarks, 67 dB improving to 74 dB with min verbal cues. Homework: Homework was completed and additional work given     Assessment / Recommendations / [place] with current plan of care     Progression Toward Goals   Progression toward goals Progressing toward goals          SLP Education - 06/15/16 1229    Education provided Yes   Education Details LSVT-LOUD   Person(s) Educated Patient  Methods Explanation   Comprehension Verbalized understanding            SLP Long Term Goals - 05/30/16 1237      SLP LONG TERM GOAL #1   Title The patient will complete Daily Tasks (Maximum duration "ah", High/Lows, and Functional Phrases) at average loudness of 80 dB and with loud, good quality voice.    Time 4   Period Weeks   Status New     SLP LONG TERM GOAL #2   Title The patient will complete Hierarchal Speech Loudness reading drills (words/phrases, sentences, and paragraph) at average 75 dB and with loud, good quality voice.     Time 4   Period Weeks   Status New     SLP LONG TERM GOAL #3   Title The patient will participate in conversation, maintaining average loudness of 75 dB and loud, good quality voice.   Time 4   Period Weeks    Status New     SLP LONG TERM GOAL #4   Title The patient will complete homework daily.   Time 4   Period Weeks   Status New          Plan - 06/15/16 1229    Clinical Impression Statement The patient is completing daily tasks and hierarchal speech drill tasks with loud, good quality voice given min SLP cues. Pt is beginning to generalize loudness into conversational speech and is demonstrating some self-correction and self-awareness of loudness.    Speech Therapy Frequency 4x / week   Duration 4 weeks   Treatment/Interventions Patient/family education;Other (comment);SLP instruction and feedback  LSVT-LOUD   Potential to Achieve Goals Good   Potential Considerations Ability to learn/carryover information;Co-morbidities;Cooperation/participation level;Previous level of function;Severity of impairments;Family/community support   SLP Home Exercise Plan LSVT-LOUD daily homework   Consulted and Agree with Plan of Care Patient      Patient will benefit from skilled therapeutic intervention in order to improve the following deficits and impairments:   Dysphonia    Problem List Patient Active Problem List   Diagnosis Date Noted  . Renal cyst, right 06/06/2016  . Deep vein thrombosis (DVT) of femoral vein of right lower extremity ([place]) 03/05/2016  . Elevated troponin 03/05/2016  . CKD (chronic kidney disease) stage 3, GFR 30-59 ml/min 03/05/2016  . Acute saddle pulmonary embolism without acute cor pulmonale (HCC) 03/03/2016  . Vitamin B12 deficiency 09/09/2014  . Preventative health care 08/04/2014  . Parkinson's disease ([place]) 08/04/2014  . Esophageal diverticulum 01/01/2014  . Actinic keratosis 07/31/2013  . Hypertension   . Osteoarthritis of both knees   . Toe amputation status ([place])   . Benign prostatic hyperplasia   . Kidney stones   . Hypothyroid   . Tendonitis    [name], MS/CCC- SLP  Lou Miner 06/15/2016, 12:30 PM  [place] MAIN [facility] SERVICES [address] [place], [place], [address] Phone: [phone]   Fax:  [phone]   Name: Naziah Weckerly MRN: [id number] Date of Birth: [birth year]

## 2016-06-16 ENCOUNTER — Encounter: Payer: Self-pay | Admitting: Occupational Therapy

## 2016-06-16 ENCOUNTER — Encounter: Payer: Self-pay | Admitting: Speech Pathology

## 2016-06-16 ENCOUNTER — Ambulatory Visit: Payer: Medicare HMO | Admitting: Occupational Therapy

## 2016-06-16 ENCOUNTER — Ambulatory Visit: Payer: Medicare HMO | Admitting: Speech Pathology

## 2016-06-16 DIAGNOSIS — R278 Other lack of coordination: Secondary | ICD-10-CM

## 2016-06-16 DIAGNOSIS — R262 Difficulty in walking, not elsewhere classified: Secondary | ICD-10-CM

## 2016-06-16 DIAGNOSIS — R490 Dysphonia: Secondary | ICD-10-CM

## 2016-06-16 DIAGNOSIS — R2681 Unsteadiness on feet: Secondary | ICD-10-CM | POA: Diagnosis not present

## 2016-06-16 DIAGNOSIS — M6281 Muscle weakness (generalized): Secondary | ICD-10-CM | POA: Diagnosis not present

## 2016-06-16 NOTE — Therapy (Signed)
[place] MAIN [facility] SERVICES [address] [place], [place], [address] Phone: [phone]   Fax:  [phone]  Speech Language Pathology Treatment  Patient Details  Name: Walter [name] MRN: [id number] Date of Birth: 07/05/1931 Referring Provider: Ludwig Clarks  Encounter Date: 06/16/2016      End of Session - 06/16/16 1204    Visit Number 5   Number of Visits 17   Date for SLP Re-Evaluation 07/01/16   SLP Start Time 1100   SLP Stop Time  1154   SLP Time Calculation (min) 54 min   Activity Tolerance Patient tolerated treatment well      Past Medical History:  Diagnosis Date  . ARF (acute renal failure) ([place]) 2011   after TKR  . BPH (benign prostatic hypertrophy)   . DVT (deep venous thrombosis) ([place]) 1998   after left knee arthroscopy  . Hx of colonoscopy 2000  . Hypertension   . Hypothyroid 2007   postop from thyroidectomy (cancer scare)  . Kidney stones    recurrent  . Oral cancer ([place]) 2004   graft from left thigh  . Osteoarthritis of both knees   . Parkinson's disease ([place])   . Tendonitis 2012   from quinolone  . Toe amputation status ([place]) 2010    Past Surgical History:  Procedure Laterality Date  . APPENDECTOMY    . CHOLECYSTECTOMY    . HERNIA REPAIR    . LITHOTRIPSY  2009/2010   then stent and cystoscopic removal 2014  . Oral cancer removed Right 2004  . REPAIR ZENKER'S DIVERTICULA  2013  . THYROIDECTOMY  2007  . TOE AMPUTATION Right 2010   badly overlapping other toe  . TOTAL KNEE ARTHROPLASTY Left   . TRANSURETHRAL RESECTION OF PROSTATE  2011  . UMBILICAL HERNIA REPAIR  2007    There were no vitals filed for this visit.      Subjective Assessment - 06/16/16 1204    Subjective Pt reported that his wife was "impressed" with his louder voice and that she hopes he can "keep it up."   Currently in Pain? No/denies               ADULT SLP TREATMENT - 06/16/16 0001      General Information    Behavior/Cognition Alert;Cooperative;Pleasant mood   HPI Parkinson's disease     Treatment Provided   Treatment provided Cognitive-Linquistic     Pain Assessment   Pain Assessment No/denies pain     Cognitive-Linquistic Treatment   Treatment focused on Voice  LSVT LOUD   Skilled Treatment LSVT LOUD Protocol: Daily task #1 Maximum sustained "ah" - Average SPL - 84 dB; sustained for average of 9 seconds; Daily Task #2 Maximum fundamental frequency range; Highs: 15 high pitched "ah" independently; Lows: 15 low pitched "ah" independently. Daily Task #3 Maximum speech loudness drill with functional phrases. Average SPL - 74 dB w/min verbal cues. Hierarchal speech loudness drills: Read sentences, 74 dB with min verbal cues. Off the cuff remarks, 67 dB improving to 74 dB with min verbal cues. Homework: Homework was completed and additional work given     Assessment / Recommendations / [place] with current plan of care     Progression Toward Goals   Progression toward goals Progressing toward goals          SLP Education - 06/16/16 1204    Education provided Yes   Education Details LSVT-LOUD   Person(s) Educated Patient  Methods Explanation   Comprehension Verbalized understanding            SLP Long Term Goals - 05/30/16 1237      SLP LONG TERM GOAL #1   Title The patient will complete Daily Tasks (Maximum duration "ah", High/Lows, and Functional Phrases) at average loudness of 80 dB and with loud, good quality voice.    Time 4   Period Weeks   Status New     SLP LONG TERM GOAL #2   Title The patient will complete Hierarchal Speech Loudness reading drills (words/phrases, sentences, and paragraph) at average 75 dB and with loud, good quality voice.     Time 4   Period Weeks   Status New     SLP LONG TERM GOAL #3   Title The patient will participate in conversation, maintaining average loudness of 75 dB and loud, good quality voice.   Time 4   Period Weeks    Status New     SLP LONG TERM GOAL #4   Title The patient will complete homework daily.   Time 4   Period Weeks   Status New          Plan - 06/16/16 1205    Clinical Impression Statement The patient is completing daily tasks and hierarchal speech drill tasks with loud, good quality voice given min SLP cues. Pt is beginning to generalize loudness into conversational speech and is demonstrating some self-correction and self-awareness of loudness.    Speech Therapy Frequency 4x / week   Duration 4 weeks   Treatment/Interventions Patient/family education;Other (comment);SLP instruction and feedback   Potential to Achieve Goals Good   Potential Considerations Ability to learn/carryover information;Co-morbidities;Cooperation/participation level;Previous level of function;Severity of impairments;Family/community support   SLP Home Exercise Plan LSVT-LOUD daily homework   Consulted and Agree with Plan of Care Patient      Patient will benefit from skilled therapeutic intervention in order to improve the following deficits and impairments:   Dysphonia    Problem List Patient Active Problem List   Diagnosis Date Noted  . Renal cyst, right 06/06/2016  . Deep vein thrombosis (DVT) of femoral vein of right lower extremity ([place]) 03/05/2016  . Elevated troponin 03/05/2016  . CKD (chronic kidney disease) stage 3, GFR 30-59 ml/min 03/05/2016  . Acute saddle pulmonary embolism without acute cor pulmonale (HCC) 03/03/2016  . Vitamin B12 deficiency 09/09/2014  . Preventative health care 08/04/2014  . Parkinson's disease ([place]) 08/04/2014  . Esophageal diverticulum 01/01/2014  . Actinic keratosis 07/31/2013  . Hypertension   . Osteoarthritis of both knees   . Toe amputation status ([place])   . Benign prostatic hyperplasia   . Kidney stones   . Hypothyroid   . Tendonitis    [name], MS/CCC- SLP  Lou Miner 06/16/2016, 12:05 PM  [place] MAIN [facility] SERVICES [address] [place], [place], [address] Phone: [phone]   Fax:  [phone]   Name: Walter [name] MRN: [id number] Date of Birth: 01-30-1932

## 2016-06-17 ENCOUNTER — Encounter: Payer: Self-pay | Admitting: Occupational Therapy

## 2016-06-17 NOTE — Therapy (Signed)
[place] MAIN [facility] SERVICES [address] [place], [place], [address] Phone: [phone]   Fax:  [phone]  Occupational Therapy Treatment  Patient Details  Name: Walter [name] MRN: [id number] Date of Birth: 1931/04/27 Referring Provider: Tat  Encounter Date: 06/13/2016      OT End of Session - 06/16/16 1545    Visit Number 6   Number of Visits 17   Date for OT Re-Evaluation 07/08/16   Authorization Type Medicare G code 6   OT Start Time 1000   OT Stop Time 1058   OT Time Calculation (min) 58 min   Activity Tolerance Patient tolerated treatment well   Behavior During Therapy [facility] for tasks assessed/performed      Past Medical History:  Diagnosis Date  . ARF (acute renal failure) ([place]) 2011   after TKR  . BPH (benign prostatic hypertrophy)   . DVT (deep venous thrombosis) ([place]) 1998   after left knee arthroscopy  . Hx of colonoscopy 2000  . Hypertension   . Hypothyroid 2007   postop from thyroidectomy (cancer scare)  . Kidney stones    recurrent  . Oral cancer ([place]) 2004   graft from left thigh  . Osteoarthritis of both knees   . Parkinson's disease ([place])   . Tendonitis 2012   from quinolone  . Toe amputation status ([place]) 2010    Past Surgical History:  Procedure Laterality Date  . APPENDECTOMY    . CHOLECYSTECTOMY    . HERNIA REPAIR    . LITHOTRIPSY  2009/2010   then stent and cystoscopic removal 2014  . Oral cancer removed Right 2004  . REPAIR ZENKER'S DIVERTICULA  2013  . THYROIDECTOMY  2007  . TOE AMPUTATION Right 2010   badly overlapping other toe  . TOTAL KNEE ARTHROPLASTY Left   . TRANSURETHRAL RESECTION OF PROSTATE  2011  . UMBILICAL HERNIA REPAIR  2007    There were no vitals filed for this visit.      Subjective Assessment - 06/16/16 1544    Pertinent History Patient was diagnosed with Parkinson's disease about 1 year ago, reports he also was diagnosed with tendonitis in bilateral lower extremities,  recently had blood clots in his lung 03-03-16, right foot with previous second toe removal.  He also has chronic back pain.     Patient Stated Goals Patient reports he would like to be able to play golf again, take care of himself, be active.    Currently in Pain? Yes   Pain Score 2    Pain Location Knee   Pain Orientation Right   Pain Descriptors / Indicators Aching   Pain Type Acute pain   Pain Onset Today   Multiple Pain Sites No                      OT Treatments/Exercises (OP) - 06/17/16 1129      ADLs   ADL Comments Establishment of functional component tasks 1) sit to stand from mat table with SBA and cues for technique, 2)  reaching down to tie shoes, 3)  posture with wall stretches and cues, 4)  toe taps on steps to encourage amplitude of step as well as manage stairs with CGA and cues., 5) turning head to the right to view traffic for lane mergers with cues.      Neurological Re-education Exercises   Other Exercises 1 Patient seen for instruction of LSVT BIG exercises: LSVT Daily Session Maximal Daily  Exercises: Sustained movements are designed to rescale the amplitude of movement output for generalization to daily functional activities. Performed as follows for 1 set of 10 repetitions each: Multi directional sustained movements- 1) Floor to ceiling, 2) Side to side. Multi directional Repetitive movements performed in standing and are designed to provide retraining effort needed for sustained muscle activation in tasks Performed as follows: 3) Step and reach forward, 4) Step and Reach Backwards, 5) Step and reach sideways, 6) Rock and reach forward/backward, 7) Rock and reach sideways. Moderate Verbal cues with all exercises and tactile cues for rock and reach sideways (twist). Sit to stand from mat table on lowest setting with cues for weight shift, technique for 10 reps for 1 set, CGA. Patient performing reciprocal stepping exercise with CGA and cues for 10 reps each foot,  stair negotiation 4 steps for 5 reps each, cues for big turns, CGA.    Other Exercises 2 Patient seen for functional mobility tasks with cues for amplitude of gait and reciprocal arm swing for 2 sets of 350 feet with SBA and moderate verbal cues for step height and length especially when patient becomes distracted.                 OT Education - 06/17/16 1136    Education provided Yes   Education Details functional component tasks, daily exercises, being consistent   Person(s) Educated Patient   Methods Explanation;Demonstration;Verbal cues   Comprehension Verbal cues required;Returned demonstration;Verbalized understanding             OT Long Term Goals - 06/11/16 1303      OT LONG TERM GOAL #1   Title Patient will improve gait speed and endurance and be able to walk 1200 feet in 6 minutes to negotiate around the home and community safely in 4 weeks   Baseline 1040 feet at evaluation   Time 4   Period Weeks   Status On-going     OT LONG TERM GOAL #2   Title Patient will complete HEP for maximal daily exercises with modified independence in 4 weeks   Baseline no current home program at eval   Time 4   Period Weeks   Status On-going     OT LONG TERM GOAL #3   Title Patient will transfer from sit to stand without the use of arms safely and independently from a variety of chairs/surfaces in 4 weeks.    Baseline difficulty from low surfaces   Time 4   Period Weeks   Status On-going     OT LONG TERM GOAL #4   Title Patient will complete basic self care tasks with modified independence with good speed in order to be on time for all appointments.    Baseline patient slow to complete tasks and occasionally requires assist to meet deadlines/appts.   Time 4   Period Weeks   Status On-going     OT LONG TERM GOAL #5   Title Patient will demonstrate improved grip strength right hand by 5# to open jars and containers with modified independence.    Baseline difficulty with  opening jars and containers at eval   Time 4   Period Weeks   Status On-going               Plan - 06/16/16 1546    Clinical Impression Statement Patient reports he has not been consistent at completing exercises at home, he feels this is due to feeling sore and not sure if  he could handle more activity at this point.  Some pain noted at the back of the right knee with transition from standing to sitting but improved by end of the session.  Patient continues to require moderate cues for size and length of steps with functional mobility and easily reverts back to old pattern when distracted.    Rehab Potential Good   Clinical Impairments Affecting Rehab Potential positive:  motivation, family support. Negative: progressive disease process, medical co morbidities   OT Frequency 4x / week   OT Duration 4 weeks   OT Treatment/Interventions Self-care/ADL training;DME and/or AE instruction;Patient/family education;Gait Training;Therapeutic exercises;Balance training;Therapeutic exercise;Stair Training;Therapeutic activities;Neuromuscular education;Functional Mobility Training;Manual Therapy   Consulted and Agree with Plan of Care Patient      Patient will benefit from skilled therapeutic intervention in order to improve the following deficits and impairments:  Abnormal gait, Decreased coordination, Decreased range of motion, Difficulty walking, Improper body mechanics, Decreased endurance, Decreased activity tolerance, Decreased balance, Impaired UE functional use, Pain, Decreased mobility, Decreased strength, Decreased knowledge of use of DME  Visit Diagnosis: Difficulty in walking, not elsewhere classified  Muscle weakness (generalized)  Other lack of coordination  Unsteadiness on feet    Problem List Patient Active Problem List   Diagnosis Date Noted  . Renal cyst, right 06/06/2016  . Deep vein thrombosis (DVT) of femoral vein of right lower extremity ([place]) 03/05/2016  .  Elevated troponin 03/05/2016  . CKD (chronic kidney disease) stage 3, GFR 30-59 ml/min 03/05/2016  . Acute saddle pulmonary embolism without acute cor pulmonale (HCC) 03/03/2016  . Vitamin B12 deficiency 09/09/2014  . Preventative health care 08/04/2014  . Parkinson's disease ([place]) 08/04/2014  . Esophageal diverticulum 01/01/2014  . Actinic keratosis 07/31/2013  . Hypertension   . Osteoarthritis of both knees   . Toe amputation status ([place])   . Benign prostatic hyperplasia   . Kidney stones   . Hypothyroid   . Tendonitis    Nikkita Adeyemi Oneita Jolly, OTR/L, CLT  Linn Clavin 06/17/2016, 11:41 AM  [place] MAIN [facility] SERVICES [address] [place], [place], [address] Phone: [phone]   Fax:  [phone]  Name: Walter [name] MRN: [id number] Date of Birth: 1931/11/05

## 2016-06-17 NOTE — Therapy (Signed)
[place] MAIN [facility] SERVICES [address] [place], [place], [address] Phone: [phone]   Fax:  [phone]  Occupational Therapy Treatment  Patient Details  Name: Walter [name] MRN: [id number] Date of Birth: [birth year] Referring Provider: Tat  Encounter Date: 06/14/2016      OT End of Session - 06/17/16 1217    Visit Number 7   Number of Visits 17   Date for OT Re-Evaluation 07/08/16   Authorization Type Medicare G code 7   OT Start Time 1000   OT Stop Time 1100   OT Time Calculation (min) 60 min   Activity Tolerance Patient tolerated treatment well   Behavior During Therapy [facility] for tasks assessed/performed      Past Medical History:  Diagnosis Date  . ARF (acute renal failure) ([place]) 2011   after TKR  . BPH (benign prostatic hypertrophy)   . DVT (deep venous thrombosis) ([place]) 1998   after left knee arthroscopy  . Hx of colonoscopy 2000  . Hypertension   . Hypothyroid 2007   postop from thyroidectomy (cancer scare)  . Kidney stones    recurrent  . Oral cancer ([place]) 2004   graft from left thigh  . Osteoarthritis of both knees   . Parkinson's disease ([place])   . Tendonitis 2012   from quinolone  . Toe amputation status ([place]) 2010    Past Surgical History:  Procedure Laterality Date  . APPENDECTOMY    . CHOLECYSTECTOMY    . HERNIA REPAIR    . LITHOTRIPSY  2009/2010   then stent and cystoscopic removal 2014  . Oral cancer removed Right 2004  . REPAIR ZENKER'S DIVERTICULA  2013  . THYROIDECTOMY  2007  . TOE AMPUTATION Right 2010   badly overlapping other toe  . TOTAL KNEE ARTHROPLASTY Left   . TRANSURETHRAL RESECTION OF PROSTATE  2011  . UMBILICAL HERNIA REPAIR  2007    There were no vitals filed for this visit.      Subjective Assessment - 06/17/16 1212    Subjective  Patient reports he is doing well with speech therapy this week, encouraged him to count aloud during exercises in the clinic and home using his LOUD  principles.    Pertinent History Patient was diagnosed with Parkinson's disease about 1 year ago, reports he also was diagnosed with tendonitis in bilateral lower extremities, recently had blood clots in his lung 03-03-16, right foot with previous second toe removal.  He also has chronic back pain.     Patient Stated Goals Patient reports he would like to be able to play golf again, take care of himself, be active.    Currently in Pain? No/denies   Pain Score 0-No pain                      OT Treatments/Exercises (OP) - 06/17/16 1213      ADLs   ADL Comments Functional component tasks 1) sit to stand from mat table with SBA and cues for technique, 2) reaching down to tie shoes for 5 repetitions, 3) posture with wall stretches and cues X 5, 4) toe taps on steps to encourage amplitude of step as well as manage stairs with CGA and cues X 5, 5) turning head to the right to view traffic for lane mergers with cues, X 5.     Neurological Re-education Exercises   Other Exercises 1 Patient seen for instruction of LSVT BIG exercises: LSVT Daily Session Maximal  Daily Exercises: Sustained movements are designed to rescale the amplitude of movement output for generalization to daily functional activities. Performed as follows for 1 set of 10 repetitions each: Multi directional sustained movements- 1) Floor to ceiling, 2) Side to side. Multi directional Repetitive movements performed in standing and are designed to provide retraining effort needed for sustained muscle activation in tasks Performed as follows: 3) Step and reach forward, 4) Step and Reach Backwards, 5) Step and reach sideways, 6) Rock and reach forward/backward, 7) Rock and reach sideways. Moderate verbal cues with all exercises and tactile cues for rock and reach sideways (twist). Sit to stand from mat table on lowest setting with cues for weight shift, technique for 10 reps for 1 set, CGA. Patient performing reciprocal stepping exercise  with CGA and cues for 10 reps each foot, stair negotiation 4 steps for 5 reps each, cues for big turns, CGA. Cues for hand positioning with stepping backwards exercise today.   Other Exercises 2 Patient seen for functional mobility tasks with cues for amplitude of gait and reciprocal arm swing for 3 sets of 250 feet with SBA and moderate verbal cues for step height and length especially when patient becomes distracted.                OT Education - 06/17/16 1217    Education provided Yes   Education Details HEP   Person(s) Educated Patient   Methods Explanation;Demonstration;Verbal cues   Comprehension Verbal cues required;Returned demonstration;Verbalized understanding             OT Long Term Goals - 06/11/16 1303      OT LONG TERM GOAL #1   Title Patient will improve gait speed and endurance and be able to walk 1200 feet in 6 minutes to negotiate around the home and community safely in 4 weeks   Baseline 1040 feet at evaluation   Time 4   Period Weeks   Status On-going     OT LONG TERM GOAL #2   Title Patient will complete HEP for maximal daily exercises with modified independence in 4 weeks   Baseline no current home program at eval   Time 4   Period Weeks   Status On-going     OT LONG TERM GOAL #3   Title Patient will transfer from sit to stand without the use of arms safely and independently from a variety of chairs/surfaces in 4 weeks.    Baseline difficulty from low surfaces   Time 4   Period Weeks   Status On-going     OT LONG TERM GOAL #4   Title Patient will complete basic self care tasks with modified independence with good speed in order to be on time for all appointments.    Baseline patient slow to complete tasks and occasionally requires assist to meet deadlines/appts.   Time 4   Period Weeks   Status On-going     OT LONG TERM GOAL #5   Title Patient will demonstrate improved grip strength right hand by 5# to open jars and containers with  modified independence.    Baseline difficulty with opening jars and containers at eval   Time 4   Period Weeks   Status On-going               Plan - 06/17/16 1217    Clinical Impression Statement Patient continues to require moderate cues for maximal daily exercises, also benefits from therapist demonstration and performing beside patient for stepping backwards  and rock and reach exercise.  He requires occasional rest breaks with exercises and during functional mobility to requires less breaks this week compared to last.  He continues to benefit from skilled OT with LSVT BIG intensive program to improve independence in daily tasks.    Rehab Potential Good   Clinical Impairments Affecting Rehab Potential positive:  motivation, family support. Negative: progressive disease process, medical co morbidities   OT Frequency 4x / week   OT Duration 4 weeks   OT Treatment/Interventions Self-care/ADL training;DME and/or AE instruction;Patient/family education;Gait Training;Therapeutic exercises;Balance training;Therapeutic exercise;Stair Training;Therapeutic activities;Neuromuscular education;Functional Mobility Training;Manual Therapy   Consulted and Agree with Plan of Care Patient      Patient will benefit from skilled therapeutic intervention in order to improve the following deficits and impairments:  Abnormal gait, Decreased coordination, Decreased range of motion, Difficulty walking, Improper body mechanics, Decreased endurance, Decreased activity tolerance, Decreased balance, Impaired UE functional use, Pain, Decreased mobility, Decreased strength, Decreased knowledge of use of DME  Visit Diagnosis: Difficulty in walking, not elsewhere classified  Muscle weakness (generalized)  Other lack of coordination  Unsteadiness on feet    Problem List Patient Active Problem List   Diagnosis Date Noted  . Renal cyst, right 06/06/2016  . Deep vein thrombosis (DVT) of femoral vein of  right lower extremity ([place]) 03/05/2016  . Elevated troponin 03/05/2016  . CKD (chronic kidney disease) stage 3, GFR 30-59 ml/min 03/05/2016  . Acute saddle pulmonary embolism without acute cor pulmonale (HCC) 03/03/2016  . Vitamin B12 deficiency 09/09/2014  . Preventative health care 08/04/2014  . Parkinson's disease ([place]) 08/04/2014  . Esophageal diverticulum 01/01/2014  . Actinic keratosis 07/31/2013  . Hypertension   . Osteoarthritis of both knees   . Toe amputation status ([place])   . Benign prostatic hyperplasia   . Kidney stones   . Hypothyroid   . Tendonitis    Dierdra Salameh Oneita Jolly, OTR/L, CLT  Mabelle Mungin 06/17/2016, 12:21 PM  [place] MAIN [facility] SERVICES [address] [place], [place], [address] Phone: [phone]   Fax:  [phone]  Name: Bauer Ausborn MRN: [id number] Date of Birth: Dec 23, 1931

## 2016-06-17 NOTE — Therapy (Signed)
[place] MAIN [facility] SERVICES [address] [place], [place], [address] Phone: [phone]   Fax:  [phone]  Occupational Therapy Treatment  Patient Details  Name: Walter [name] MRN: [id number] Date of Birth: 01-Jun-1931 Referring Provider: Tat  Encounter Date: 06/16/2016      OT End of Session - 06/17/16 1551    Visit Number 9   Number of Visits 17   Date for OT Re-Evaluation 07/08/16   Authorization Type Medicare G code 9   OT Start Time 1000   OT Stop Time 1055   OT Time Calculation (min) 55 min      Past Medical History:  Diagnosis Date  . ARF (acute renal failure) ([place]) 2011   after TKR  . BPH (benign prostatic hypertrophy)   . DVT (deep venous thrombosis) ([place]) 1998   after left knee arthroscopy  . Hx of colonoscopy 2000  . Hypertension   . Hypothyroid 2007   postop from thyroidectomy (cancer scare)  . Kidney stones    recurrent  . Oral cancer ([place]) 2004   graft from left thigh  . Osteoarthritis of both knees   . Parkinson's disease ([place])   . Tendonitis 2012   from quinolone  . Toe amputation status ([place]) 2010    Past Surgical History:  Procedure Laterality Date  . APPENDECTOMY    . CHOLECYSTECTOMY    . HERNIA REPAIR    . LITHOTRIPSY  2009/2010   then stent and cystoscopic removal 2014  . Oral cancer removed Right 2004  . REPAIR ZENKER'S DIVERTICULA  2013  . THYROIDECTOMY  2007  . TOE AMPUTATION Right 2010   badly overlapping other toe  . TOTAL KNEE ARTHROPLASTY Left   . TRANSURETHRAL RESECTION OF PROSTATE  2011  . UMBILICAL HERNIA REPAIR  2007    There were no vitals filed for this visit.      Subjective Assessment - 06/17/16 1542    Subjective  Patient reports he is concerned that he has had some pulling/pain at the back of the right knee when transitioning from stand to sit at times, it has happened twice this week while performing sit to stand exercises and he has not felt it before.    Pertinent  History Patient was diagnosed with Parkinson's disease about 1 year ago, reports he also was diagnosed with tendonitis in bilateral lower extremities, recently had blood clots in his lung 03-03-16, right foot with previous second toe removal.  He also has chronic back pain.     Patient Stated Goals Patient reports he would like to be able to play golf again, take care of himself, be active.    Currently in Pain? Yes   Pain Score 2                       OT Treatments/Exercises (OP) - 06/17/16 1543      ADLs   ADL Comments Functional component tasks as follows this date:   1)Transitions from sit to stand from mat table with SBA and cues for technique, 2) reaching down to tie shoes for 5 repetitions, 3) posture with wall stretches and verbal cues for scapular retraction and head up 5 repetitions, 4) toe taps on steps to encourage amplitude of step as well as manage stairs with CGA and cues for 5 repetitions, 5) turning head to the right to view traffic for lane mergers with cues, 5 repetitions.     Neurological Re-education Exercises  Other Exercises 1 Patient seen for instruction of LSVT BIG exercises: LSVT Daily Session Maximal Daily Exercises: Sustained movements are designed to rescale the amplitude of movement output for generalization to daily functional activities. Performed as follows for 1 set of 10 repetitions each: Multi directional sustained movements- 1) Floor to ceiling, 2) Side to side. Multi directional Repetitive movements performed in standing and are designed to provide retraining effort needed for sustained muscle activation in tasks Performed as follows: 3) Step and reach forward, 4) Step and Reach Backwards, 5) Step and reach sideways, 6) Rock and reach forward/backward, 7) Rock and reach sideways. Moderate verbal cues with all exercises and tactile cues for rock and reach sideways (twist). Sit to stand from mat table on lowest setting with cues for weight shift,  technique for 10 reps for 1 set, CGA. Patient performing reciprocal stepping exercise with CGA and cues for 10 reps each foot, stair negotiation 4 steps for 5 reps each, cues for big turns, CGA   Other Exercises 2 Patient seen for functional mobility tasks with cues for amplitude of gait and reciprocal arm swing for 2 sets of 375 feet with SBA and moderate verbal cues for step height and length especially when patient becomes distracted., flat surface                OT Education - 06/17/16 1551    Education provided Yes   Education Details frequency of exercises   Person(s) Educated Patient   Methods Explanation;Demonstration;Verbal cues   Comprehension Verbal cues required;Returned demonstration;Verbalized understanding             OT Long Term Goals - 06/11/16 1303      OT LONG TERM GOAL #1   Title Patient will improve gait speed and endurance and be able to walk 1200 feet in 6 minutes to negotiate around the home and community safely in 4 weeks   Baseline 1040 feet at evaluation   Time 4   Period Weeks   Status On-going     OT LONG TERM GOAL #2   Title Patient will complete HEP for maximal daily exercises with modified independence in 4 weeks   Baseline no current home program at eval   Time 4   Period Weeks   Status On-going     OT LONG TERM GOAL #3   Title Patient will transfer from sit to stand without the use of arms safely and independently from a variety of chairs/surfaces in 4 weeks.    Baseline difficulty from low surfaces   Time 4   Period Weeks   Status On-going     OT LONG TERM GOAL #4   Title Patient will complete basic self care tasks with modified independence with good speed in order to be on time for all appointments.    Baseline patient slow to complete tasks and occasionally requires assist to meet deadlines/appts.   Time 4   Period Weeks   Status On-going     OT LONG TERM GOAL #5   Title Patient will demonstrate improved grip strength  right hand by 5# to open jars and containers with modified independence.    Baseline difficulty with opening jars and containers at eval   Time 4   Period Weeks   Status On-going               Plan - 06/17/16 1551    Clinical Impression Statement Patient has continued to progress during therapy sessions but still needs to  work on consistency of performing exercises at home.  Will continue to work towards refinement of performance of maximal daily exercises in the clinic.  Patient continues to require cues for calibration of movements with functional mobility skills especially with amplitude of gait when distracted.     Rehab Potential Good   Clinical Impairments Affecting Rehab Potential positive:  motivation, family support. Negative: progressive disease process, medical co morbidities   OT Frequency 4x / week   OT Duration 4 weeks   OT Treatment/Interventions Self-care/ADL training;DME and/or AE instruction;Patient/family education;Gait Training;Therapeutic exercises;Balance training;Therapeutic exercise;Stair Training;Therapeutic activities;Neuromuscular education;Functional Mobility Training;Manual Therapy   Consulted and Agree with Plan of Care Patient      Patient will benefit from skilled therapeutic intervention in order to improve the following deficits and impairments:  Abnormal gait, Decreased coordination, Decreased range of motion, Difficulty walking, Improper body mechanics, Decreased endurance, Decreased activity tolerance, Decreased balance, Impaired UE functional use, Pain, Decreased mobility, Decreased strength, Decreased knowledge of use of DME  Visit Diagnosis: Difficulty in walking, not elsewhere classified  Muscle weakness (generalized)  Other lack of coordination  Unsteadiness on feet    Problem List Patient Active Problem List   Diagnosis Date Noted  . Renal cyst, right 06/06/2016  . Deep vein thrombosis (DVT) of femoral vein of right lower extremity  ([place]) 03/05/2016  . Elevated troponin 03/05/2016  . CKD (chronic kidney disease) stage 3, GFR 30-59 ml/min 03/05/2016  . Acute saddle pulmonary embolism without acute cor pulmonale (HCC) 03/03/2016  . Vitamin B12 deficiency 09/09/2014  . Preventative health care 08/04/2014  . Parkinson's disease ([place]) 08/04/2014  . Esophageal diverticulum 01/01/2014  . Actinic keratosis 07/31/2013  . Hypertension   . Osteoarthritis of both knees   . Toe amputation status ([place])   . Benign prostatic hyperplasia   . Kidney stones   . Hypothyroid   . Tendonitis    Mone Commisso Oneita Jolly, OTR/L, CLT  Georgeana Oertel 06/17/2016, 3:54 PM  [place] MAIN [facility] SERVICES [address] [place], [place], [address] Phone: [phone]   Fax:  [phone]  Name: Walter [name] MRN: [id number] Date of Birth: August 05, 1931

## 2016-06-17 NOTE — Therapy (Addendum)
[place] MAIN [facility] SERVICES [address] [place], [place], [address] Phone: [phone]   Fax:  [phone]  Occupational Therapy Treatment  Patient Details  Name: Walter [name] MRN: [id number] Date of Birth: Oct 28, 1931 Referring Provider: Tat  Encounter Date: 06/15/2016      OT End of Session - 06/17/16 1426    Visit Number 8   Number of Visits 17   Date for OT Re-Evaluation 07/08/16   Authorization Type Medicare G code 8   OT Start Time 1001   OT Stop Time 1100   OT Time Calculation (min) 59 min   Activity Tolerance Patient tolerated treatment well   Behavior During Therapy [facility] for tasks assessed/performed      Past Medical History:  Diagnosis Date  . ARF (acute renal failure) ([place]) 2011   after TKR  . BPH (benign prostatic hypertrophy)   . DVT (deep venous thrombosis) ([place]) 1998   after left knee arthroscopy  . Hx of colonoscopy 2000  . Hypertension   . Hypothyroid 2007   postop from thyroidectomy (cancer scare)  . Kidney stones    recurrent  . Oral cancer ([place]) 2004   graft from left thigh  . Osteoarthritis of both knees   . Parkinson's disease ([place])   . Tendonitis 2012   from quinolone  . Toe amputation status ([place]) 2010    Past Surgical History:  Procedure Laterality Date  . APPENDECTOMY    . CHOLECYSTECTOMY    . HERNIA REPAIR    . LITHOTRIPSY  2009/2010   then stent and cystoscopic removal 2014  . Oral cancer removed Right 2004  . REPAIR ZENKER'S DIVERTICULA  2013  . THYROIDECTOMY  2007  . TOE AMPUTATION Right 2010   badly overlapping other toe  . TOTAL KNEE ARTHROPLASTY Left   . TRANSURETHRAL RESECTION OF PROSTATE  2011  . UMBILICAL HERNIA REPAIR  2007    There were no vitals filed for this visit.      Subjective Assessment - 06/17/16 1425    Subjective  Patient reports he has been tired from the exercises and has not been able to complete additional exercises at home.  He has been trying to work on  taking bigger steps when walking.     Pertinent History Patient was diagnosed with Parkinson's disease about 1 year ago, reports he also was diagnosed with tendonitis in bilateral lower extremities, recently had blood clots in his lung 03-03-16, right foot with previous second toe removal.  He also has chronic back pain.     Patient Stated Goals Patient reports he would like to be able to play golf again, take care of himself, be active.    Currently in Pain? Yes   Pain Score 2    Pain Location Knee   Pain Orientation Right   Pain Type Acute pain   Multiple Pain Sites No                      OT Treatments/Exercises (OP) - 06/17/16 1430      ADLs   ADL Comments Functional component tasks 1) sit to stand from mat table with SBA and cues for technique, 2) reaching down to tie shoes for 5 repetitions, 3) posture with wall stretches and cues X 5, 4) toe taps on steps to encourage amplitude of step as well as manage stairs with CGA and cues X 5, 5) turning head to the right to view traffic for lane  mergers with cues, X 5.     Neurological Re-education Exercises   Other Exercises 1 Patient seen for instruction of LSVT BIG exercises: LSVT Daily Session Maximal Daily Exercises: Sustained movements are designed to rescale the amplitude of movement output for generalization to daily functional activities. Performed as follows for 1 set of 10 repetitions each: Multi directional sustained movements- 1) Floor to ceiling, 2) Side to side. Multi directional Repetitive movements performed in standing and are designed to provide retraining effort needed for sustained muscle activation in tasks Performed as follows: 3) Step and reach forward, 4) Step and Reach Backwards, 5) Step and reach sideways, 6) Rock and reach forward/backward, 7) Rock and reach sideways. Moderate verbal cues with all exercises and tactile cues for rock and reach sideways (twist). Sit to stand from mat table on lowest setting with  cues for weight shift, technique for 10 reps for 1 set, CGA. Patient performing reciprocal stepping exercise with CGA and cues for 10 reps each foot, stair negotiation 4 steps for 5 reps each, cues for big turns, CGA. Cues for hand positioning with stepping backwards exercise.   Other Exercises 2 Patient seen for functional mobility tasks with cues for amplitude of gait and reciprocal arm swing for 2 sets of 400 feet with SBA and moderate verbal cues for step height and length especially when patient becomes distracted.                OT Education - 06/17/16 1426    Education provided Yes   Education Details LSVT BIG exercises, functional component tasks   Person(s) Educated Patient   Methods Explanation;Demonstration;Verbal cues   Comprehension Verbal cues required;Returned demonstration;Verbalized understanding             OT Long Term Goals - 06/11/16 1303      OT LONG TERM GOAL #1   Title Patient will improve gait speed and endurance and be able to walk 1200 feet in 6 minutes to negotiate around the home and community safely in 4 weeks   Baseline 1040 feet at evaluation   Time 4   Period Weeks   Status On-going     OT LONG TERM GOAL #2   Title Patient will complete HEP for maximal daily exercises with modified independence in 4 weeks   Baseline no current home program at eval   Time 4   Period Weeks   Status On-going     OT LONG TERM GOAL #3   Title Patient will transfer from sit to stand without the use of arms safely and independently from a variety of chairs/surfaces in 4 weeks.    Baseline difficulty from low surfaces   Time 4   Period Weeks   Status On-going     OT LONG TERM GOAL #4   Title Patient will complete basic self care tasks with modified independence with good speed in order to be on time for all appointments.    Baseline patient slow to complete tasks and occasionally requires assist to meet deadlines/appts.   Time 4   Period Weeks   Status  On-going     OT LONG TERM GOAL #5   Title Patient will demonstrate improved grip strength right hand by 5# to open jars and containers with modified independence.    Baseline difficulty with opening jars and containers at eval   Time 4   Period Weeks   Status On-going  Plan - 06/17/16 1427    Clinical Impression Statement Continuing to work towards performing second set of exercises at home daily.  Patient reports he has not been able to be consistent and feels he is tired and sore at times from exercises in the clinic but demonstrates understanding that he needs to also perform maximal daily exercises twice a day for optimal effect of program.  Patient performing exercises in clinic with CGA and cues but at home he is supposed to follow adapted version with use of a chair for safety.  Will follow up on Monday to see how his exercises went over the weekend.     Rehab Potential Good   Clinical Impairments Affecting Rehab Potential positive:  motivation, family support. Negative: progressive disease process, medical co morbidities   OT Frequency 4x / week   OT Duration 4 weeks   OT Treatment/Interventions Self-care/ADL training;DME and/or AE instruction;Patient/family education;Gait Training;Therapeutic exercises;Balance training;Therapeutic exercise;Stair Training;Therapeutic activities;Neuromuscular education;Functional Mobility Training;Manual Therapy   Consulted and Agree with Plan of Care Patient      Patient will benefit from skilled therapeutic intervention in order to improve the following deficits and impairments:  Abnormal gait, Decreased coordination, Decreased range of motion, Difficulty walking, Improper body mechanics, Decreased endurance, Decreased activity tolerance, Decreased balance, Impaired UE functional use, Pain, Decreased mobility, Decreased strength, Decreased knowledge of use of DME  Visit Diagnosis: Difficulty in walking, not elsewhere  classified  Muscle weakness (generalized)  Other lack of coordination  Unsteadiness on feet    Problem List Patient Active Problem List   Diagnosis Date Noted  . Renal cyst, right 06/06/2016  . Deep vein thrombosis (DVT) of femoral vein of right lower extremity ([place]) 03/05/2016  . Elevated troponin 03/05/2016  . CKD (chronic kidney disease) stage 3, GFR 30-59 ml/min 03/05/2016  . Acute saddle pulmonary embolism without acute cor pulmonale (HCC) 03/03/2016  . Vitamin B12 deficiency 09/09/2014  . Preventative health care 08/04/2014  . Parkinson's disease ([place]) 08/04/2014  . Esophageal diverticulum 01/01/2014  . Actinic keratosis 07/31/2013  . Hypertension   . Osteoarthritis of both knees   . Toe amputation status ([place])   . Benign prostatic hyperplasia   . Kidney stones   . Hypothyroid   . Tendonitis    Jannett Schmall Oneita Jolly, OTR/L, CLT  Chelisa Hennen 06/17/2016, 2:31 PM  [place] MAIN [facility] SERVICES [address] [place], [place], [address] Phone: [phone]   Fax:  [phone]  Name: Lankford Gutzmer MRN: [id number] Date of Birth: 1931/11/17

## 2016-06-20 ENCOUNTER — Ambulatory Visit: Payer: Medicare HMO | Admitting: Speech Pathology

## 2016-06-20 ENCOUNTER — Encounter: Payer: Self-pay | Admitting: Occupational Therapy

## 2016-06-20 ENCOUNTER — Ambulatory Visit: Payer: Medicare HMO | Admitting: Occupational Therapy

## 2016-06-20 ENCOUNTER — Encounter: Payer: Self-pay | Admitting: Speech Pathology

## 2016-06-20 DIAGNOSIS — R278 Other lack of coordination: Secondary | ICD-10-CM

## 2016-06-20 DIAGNOSIS — M6281 Muscle weakness (generalized): Secondary | ICD-10-CM

## 2016-06-20 DIAGNOSIS — R2681 Unsteadiness on feet: Secondary | ICD-10-CM

## 2016-06-20 DIAGNOSIS — R490 Dysphonia: Secondary | ICD-10-CM

## 2016-06-20 DIAGNOSIS — R262 Difficulty in walking, not elsewhere classified: Secondary | ICD-10-CM | POA: Diagnosis not present

## 2016-06-20 NOTE — Therapy (Signed)
[place] MAIN [facility] SERVICES [address] [place], [place], [address] Phone: [phone]   Fax:  [phone]  Speech Language Pathology Treatment  Patient Details  Name: Walter [name] MRN: [id number] Date of Birth: 08/28/1931 Referring Provider: Ludwig Clarks  Encounter Date: 06/20/2016      End of Session - 06/20/16 1211    Visit Number 6   Number of Visits 17   Date for SLP Re-Evaluation 07/01/16   SLP Start Time 1100   SLP Stop Time  1152   SLP Time Calculation (min) 52 min   Activity Tolerance Patient tolerated treatment well      Past Medical History:  Diagnosis Date  . ARF (acute renal failure) ([place]) 2011   after TKR  . BPH (benign prostatic hypertrophy)   . DVT (deep venous thrombosis) ([place]) 1998   after left knee arthroscopy  . Hx of colonoscopy 2000  . Hypertension   . Hypothyroid 2007   postop from thyroidectomy (cancer scare)  . Kidney stones    recurrent  . Oral cancer ([place]) 2004   graft from left thigh  . Osteoarthritis of both knees   . Parkinson's disease ([place])   . Tendonitis 2012   from quinolone  . Toe amputation status ([place]) 2010    Past Surgical History:  Procedure Laterality Date  . APPENDECTOMY    . CHOLECYSTECTOMY    . HERNIA REPAIR    . LITHOTRIPSY  2009/2010   then stent and cystoscopic removal 2014  . Oral cancer removed Right 2004  . REPAIR ZENKER'S DIVERTICULA  2013  . THYROIDECTOMY  2007  . TOE AMPUTATION Right 2010   badly overlapping other toe  . TOTAL KNEE ARTHROPLASTY Left   . TRANSURETHRAL RESECTION OF PROSTATE  2011  . UMBILICAL HERNIA REPAIR  2007    There were no vitals filed for this visit.      Subjective Assessment - 06/20/16 1210    Subjective The patient reports that he was able to sing louder and read louder at church   Currently in Pain? No/denies               ADULT SLP TREATMENT - 06/20/16 0001      General Information   Behavior/Cognition  Alert;Cooperative;Pleasant mood   HPI Parkinson's disease     Treatment Provided   Treatment provided Cognitive-Linquistic     Pain Assessment   Pain Assessment No/denies pain     Cognitive-Linquistic Treatment   Treatment focused on Voice  LSVT LOUD   Skilled Treatment LSVT LOUD Protocol: Daily task #1 Maximum sustained "ah" - Average SPL - 85 dB; sustained for average of 9 seconds; Daily Task #2 Maximum fundamental frequency range; Highs: 15 high pitched "ah" independently; Lows: 15 low pitched "ah" independently. Daily Task #3 Maximum speech loudness drill with functional phrases. Average SPL - 75 dB. Hierarchal speech loudness drills: Lengthy reading, 75 dB. Structured conversation, 73 dB.  Off the cuff remarks, 71 dB improving to 75 dB with min verbal cues. Homework: Homework was completed and additional work given.     Assessment / Recommendations / Plan   Plan Continue with current plan of care     Progression Toward Goals   Progression toward goals Progressing toward goals          SLP Education - 06/20/16 1211    Education provided Yes   Education Details LSVT-LOUD   Person(s) Educated Patient   Methods Explanation   Comprehension  Verbalized understanding            SLP Long Term Goals - 05/30/16 1237      SLP LONG TERM GOAL #1   Title The patient will complete Daily Tasks (Maximum duration "ah", High/Lows, and Functional Phrases) at average loudness of 80 dB and with loud, good quality voice.    Time 4   Period Weeks   Status New     SLP LONG TERM GOAL #2   Title The patient will complete Hierarchal Speech Loudness reading drills (words/phrases, sentences, and paragraph) at average 75 dB and with loud, good quality voice.     Time 4   Period Weeks   Status New     SLP LONG TERM GOAL #3   Title The patient will participate in conversation, maintaining average loudness of 75 dB and loud, good quality voice.   Time 4   Period Weeks   Status New     SLP  LONG TERM GOAL #4   Title The patient will complete homework daily.   Time 4   Period Weeks   Status New          Plan - 06/20/16 1211    Clinical Impression Statement The patient is completing daily tasks and hierarchal speech drill tasks with loud, good quality voice given min SLP cues. Pt is beginning to generalize loudness into conversational speech and is demonstrating some self-correction and self-awareness of loudness.    Speech Therapy Frequency 4x / week   Duration 4 weeks   Treatment/Interventions Patient/family education;Other (comment);SLP instruction and feedback  LSVT-LOUD   Potential to Achieve Goals Good   Potential Considerations Ability to learn/carryover information;Co-morbidities;Cooperation/participation level;Previous level of function;Severity of impairments;Family/community support   SLP Home Exercise Plan LSVT-LOUD daily homework   Consulted and Agree with Plan of Care Patient      Patient will benefit from skilled therapeutic intervention in order to improve the following deficits and impairments:   Dysphonia    Problem List Patient Active Problem List   Diagnosis Date Noted  . Renal cyst, right 06/06/2016  . Deep vein thrombosis (DVT) of femoral vein of right lower extremity ([place]) 03/05/2016  . Elevated troponin 03/05/2016  . CKD (chronic kidney disease) stage 3, GFR 30-59 ml/min 03/05/2016  . Acute saddle pulmonary embolism without acute cor pulmonale (HCC) 03/03/2016  . Vitamin B12 deficiency 09/09/2014  . Preventative health care 08/04/2014  . Parkinson's disease ([place]) 08/04/2014  . Esophageal diverticulum 01/01/2014  . Actinic keratosis 07/31/2013  . Hypertension   . Osteoarthritis of both knees   . Toe amputation status ([place])   . Benign prostatic hyperplasia   . Kidney stones   . Hypothyroid   . Tendonitis    [name], MS/CCC- SLP  Lou Miner 06/20/2016, 12:12 PM  [place] MAIN  [facility] SERVICES [address] [place], [place], [address] Phone: [phone]   Fax:  [phone]   Name: Walter [name] MRN: [id number] Date of Birth: 21-Jul-1931

## 2016-06-21 ENCOUNTER — Encounter: Payer: Self-pay | Admitting: Occupational Therapy

## 2016-06-21 ENCOUNTER — Ambulatory Visit: Payer: Medicare HMO | Admitting: Occupational Therapy

## 2016-06-21 ENCOUNTER — Encounter: Payer: Self-pay | Admitting: Speech Pathology

## 2016-06-21 ENCOUNTER — Ambulatory Visit: Payer: Medicare HMO | Admitting: Speech Pathology

## 2016-06-21 DIAGNOSIS — M6281 Muscle weakness (generalized): Secondary | ICD-10-CM | POA: Diagnosis not present

## 2016-06-21 DIAGNOSIS — R490 Dysphonia: Secondary | ICD-10-CM

## 2016-06-21 DIAGNOSIS — R262 Difficulty in walking, not elsewhere classified: Secondary | ICD-10-CM | POA: Diagnosis not present

## 2016-06-21 DIAGNOSIS — R2681 Unsteadiness on feet: Secondary | ICD-10-CM | POA: Diagnosis not present

## 2016-06-21 DIAGNOSIS — R278 Other lack of coordination: Secondary | ICD-10-CM

## 2016-06-21 NOTE — Therapy (Signed)
[place] MAIN [facility] SERVICES [address] [place], [place], [address] Phone: [phone]   Fax:  [phone]  Occupational Therapy Treatment  Patient Details  Name: Walter [name] MRN: [id number] Date of Birth: 1931/09/19 Referring Provider: Tat  Encounter Date: 06/20/2016      OT End of Session - 06/20/16 1549    Visit Number 10   Number of Visits 17   Date for OT Re-Evaluation 07/08/16   Authorization Type Medicare G code 10   OT Start Time 1000   OT Stop Time 1054   OT Time Calculation (min) 54 min   Activity Tolerance Patient tolerated treatment well   Behavior During Therapy [facility] for tasks assessed/performed      Past Medical History:  Diagnosis Date  . ARF (acute renal failure) ([name]) 2011   after TKR  . BPH (benign prostatic hypertrophy)   . DVT (deep venous thrombosis) ([place]) 1998   after left knee arthroscopy  . Hx of colonoscopy 2000  . Hypertension   . Hypothyroid 2007   postop from thyroidectomy (cancer scare)  . Kidney stones    recurrent  . Oral cancer ([place]) 2004   graft from left thigh  . Osteoarthritis of both knees   . Parkinson's disease ([place])   . Tendonitis 2012   from quinolone  . Toe amputation status ([place]) 2010    Past Surgical History:  Procedure Laterality Date  . APPENDECTOMY    . CHOLECYSTECTOMY    . HERNIA REPAIR    . LITHOTRIPSY  2009/2010   then stent and cystoscopic removal 2014  . Oral cancer removed Right 2004  . REPAIR ZENKER'S DIVERTICULA  2013  . THYROIDECTOMY  2007  . TOE AMPUTATION Right 2010   badly overlapping other toe  . TOTAL KNEE ARTHROPLASTY Left   . TRANSURETHRAL RESECTION OF PROSTATE  2011  . UMBILICAL HERNIA REPAIR  2007    There were no vitals filed for this visit.      Subjective Assessment - 06/20/16 1544    Subjective  Patient reports he has felt good the last couple days, he is planning to stop therapy at the end of this week, one week shy of completing LSVT,  stating financial reasons with co pays.     Pertinent History Patient was diagnosed with Parkinson's disease about 1 year ago, reports he also was diagnosed with tendonitis in bilateral lower extremities, recently had blood clots in his lung 03-03-16, right foot with previous second toe removal.  He also has chronic back pain.     Patient Stated Goals Patient reports he would like to be able to play golf again, take care of himself, be active.    Currently in Pain? No/denies   Pain Score 0-No pain                      OT Treatments/Exercises (OP) - 06/20/16 1459      ADLs   ADL Comments Functional component tasks as follows: 1)Transitions from sit to stand from mat table with supervision and occasional cues for technique, 2) reaching down to tie shoes for 5 repetitions, 3) posture with wall stretches and verbal cues for scapular retraction and head up 5 repetitions, 4) toe taps on steps to encourage amplitude of step as well as manage stairs with CGA and cues for 5 repetitions, 5) turning head to the right to view traffic for lane mergers with cues, 5 repetitions (performed seated to not  incorporate weight shifts at hips).     Neurological Re-education Exercises   Other Exercises 1 Patient seen for instruction of LSVT BIG exercises: LSVT Daily Session Maximal Daily Exercises: Sustained movements are designed to rescale the amplitude of movement output for generalization to daily functional activities. Performed as follows for 1 set of 10 repetitions each: Multi directional sustained movements- 1) Floor to ceiling, 2) Side to side. Multi directional Repetitive movements performed in standing and are designed to provide retraining effort needed for sustained muscle activation in tasks Performed as follows: 3) Step and reach forward, 4) Step and Reach Backwards, 5) Step and reach sideways, 6) Rock and reach forward/backward, 7) Rock and reach sideways. Minimal verbal cues with all exercises.  Sit to stand from mat table on lowest setting with cues for weight shift, technique for 10 reps for 1 set, SBA. Patient performing reciprocal stepping exercise with SBA and cues for 10 reps each foot, stair negotiation 4 steps for 5 reps each, cues for big turns, SBA   Other Exercises 2 Patient seen for 6 minute walk test, 1135 feet.  5 times sit to stand 13 sec.                 OT Education - 06/20/16 1546    Education provided Yes   Education Details LSVT BIG maximal daily exercises, goals   Person(s) Educated Patient   Methods Explanation;Demonstration;Verbal cues   Comprehension Verbal cues required;Returned demonstration;Verbalized understanding             OT Long Term Goals - 06/20/16 1550      OT LONG TERM GOAL #1   Title Patient will improve gait speed and endurance and be able to walk 1200 feet in 6 minutes to negotiate around the home and community safely in 4 weeks   Baseline 1040 feet at evaluation, 1135 feet at 10th visit   Time 4   Period Weeks   Status On-going     OT LONG TERM GOAL #2   Title Patient will complete HEP for maximal daily exercises with modified independence in 4 weeks   Baseline no current home program at eval   Time 4   Period Weeks   Status On-going     OT LONG TERM GOAL #3   Title Patient will transfer from sit to stand without the use of arms safely and independently from a variety of chairs/surfaces in 4 weeks.    Baseline difficulty from low surfaces   Time 4   Period Weeks   Status On-going     OT LONG TERM GOAL #4   Title Patient will complete basic self care tasks with modified independence with good speed in order to be on time for all appointments.    Baseline patient slow to complete tasks and occasionally requires assist to meet deadlines/appts.   Time 4   Period Weeks   Status On-going     OT LONG TERM GOAL #5   Title Patient will demonstrate improved grip strength right hand by 5# to open jars and containers with  modified independence.    Baseline difficulty with opening jars and containers at eval   Time 4   Period Weeks   Status On-going               Plan - 06/20/16 1549    Clinical Impression Statement Patient reported today he plans to finish up at the end of this week, one week shy of completing the full  4 weeks of intensive LSVT BIG program.  He reports it has to do with financial reasons and co pays.  Discussed the implications of stopping early and he may not see the full benefits of the program if he chooses to stop early.  He reports understanding and states he has already discussed it with his wife and has made a decision.  He has continued to make good progress and would benefit from completion of full program.  Reassessment this date included 6 minute walk test 1135 feet (increased from 1040 at eval), 5 times sit to stand 13 secs.  Patient continues to benefit from skilled OT for intensive LSVT BIG program to impact his functional mobility skills, balance, transfers and self care tasks.    Rehab Potential Good   Clinical Impairments Affecting Rehab Potential positive:  motivation, family support. Negative: progressive disease process, medical co morbidities   OT Frequency 4x / week   OT Duration 4 weeks   OT Treatment/Interventions Self-care/ADL training;DME and/or AE instruction;Patient/family education;Gait Training;Therapeutic exercises;Balance training;Therapeutic exercise;Stair Training;Therapeutic activities;Neuromuscular education;Functional Mobility Training;Manual Therapy   Consulted and Agree with Plan of Care Patient      Patient will benefit from skilled therapeutic intervention in order to improve the following deficits and impairments:  Abnormal gait, Decreased coordination, Decreased range of motion, Difficulty walking, Improper body mechanics, Decreased endurance, Decreased activity tolerance, Decreased balance, Impaired UE functional use, Pain, Decreased mobility,  Decreased strength, Decreased knowledge of use of DME  Visit Diagnosis: Difficulty in walking, not elsewhere classified  Muscle weakness (generalized)  Other lack of coordination  Unsteadiness on feet      G-Codes - 07/07/2016 1550    Functional Assessment Tool Used (Outpatient only) clinical judgment, ADL assessment, 6 minute walk test, 5 times sit to stand, manual muscle testing.   Functional Limitation Mobility: Walking and moving around   Mobility: Walking and Moving Around Current Status [phone]) At least 40 percent but less than 60 percent impaired, limited or restricted   Mobility: Walking and Moving Around Goal Status [phone]) At least 20 percent but less than 40 percent impaired, limited or restricted      Problem List Patient Active Problem List   Diagnosis Date Noted  . Renal cyst, right 06/06/2016  . Deep vein thrombosis (DVT) of femoral vein of right lower extremity ([place]) 03/05/2016  . Elevated troponin 03/05/2016  . CKD (chronic kidney disease) stage 3, GFR 30-59 ml/min 03/05/2016  . Acute saddle pulmonary embolism without acute cor pulmonale (HCC) 03/03/2016  . Vitamin B12 deficiency 09/09/2014  . Preventative health care 08/04/2014  . Parkinson's disease ([place]) 08/04/2014  . Esophageal diverticulum 01/01/2014  . Actinic keratosis 07/31/2013  . Hypertension   . Osteoarthritis of both knees   . Toe amputation status ([place])   . Benign prostatic hyperplasia   . Kidney stones   . Hypothyroid   . Tendonitis    Lillianah Swartzentruber Oneita Jolly, OTR/L, CLT  Walter [name] 06/21/2016, 3:05 PM  [place] MAIN [facility] SERVICES [address] [place], [place], [address] Phone: [phone]   Fax:  [phone]  Name: Walter [name] MRN: [id number] Date of Birth: [birth year]

## 2016-06-21 NOTE — Therapy (Signed)
[place] MAIN [facility] SERVICES [address] [place], [place], [address] Phone: [phone]   Fax:  [phone]  Speech Language Pathology Treatment  Patient Details  Name: Walter [name] MRN: [id number] Date of Birth: March 12, 1931 Referring Provider: Ludwig Clarks  Encounter Date: 06/21/2016      End of Session - 06/21/16 1212    Visit Number 7   Number of Visits 17   Date for SLP Re-Evaluation 07/01/16   SLP Start Time 1100   SLP Stop Time  1155   SLP Time Calculation (min) 55 min   Activity Tolerance Patient tolerated treatment well      Past Medical History:  Diagnosis Date  . ARF (acute renal failure) ([place]) 2011   after TKR  . BPH (benign prostatic hypertrophy)   . DVT (deep venous thrombosis) ([place]) 1998   after left knee arthroscopy  . Hx of colonoscopy 2000  . Hypertension   . Hypothyroid 2007   postop from thyroidectomy (cancer scare)  . Kidney stones    recurrent  . Oral cancer ([place]) 2004   graft from left thigh  . Osteoarthritis of both knees   . Parkinson's disease ([place])   . Tendonitis 2012   from quinolone  . Toe amputation status ([place]) 2010    Past Surgical History:  Procedure Laterality Date  . APPENDECTOMY    . CHOLECYSTECTOMY    . HERNIA REPAIR    . LITHOTRIPSY  2009/2010   then stent and cystoscopic removal 2014  . Oral cancer removed Right 2004  . REPAIR ZENKER'S DIVERTICULA  2013  . THYROIDECTOMY  2007  . TOE AMPUTATION Right 2010   badly overlapping other toe  . TOTAL KNEE ARTHROPLASTY Left   . TRANSURETHRAL RESECTION OF PROSTATE  2011  . UMBILICAL HERNIA REPAIR  2007    There were no vitals filed for this visit.      Subjective Assessment - 06/21/16 1212    Subjective The patient reports that he was able to sing louder and read louder at church   Currently in Pain? No/denies               ADULT SLP TREATMENT - 06/21/16 0001      General Information   Behavior/Cognition  Alert;Cooperative;Pleasant mood   HPI Parkinson's disease     Cognitive-Linquistic Treatment   Treatment focused on Voice  LSVT LOUD   Skilled Treatment LSVT LOUD Protocol: Daily task #1 Maximum sustained "ah" - Average SPL - 85 dB; sustained for average of 9 seconds; Daily Task #2 Maximum fundamental frequency range; Highs: 15 high pitched "ah" independently; Lows: 15 low pitched "ah" independently. Daily Task #3 Maximum speech loudness drill with functional phrases. Average SPL - 75 dB. Hierarchal speech loudness drills: Lengthy reading, 75 dB. Structured conversation, 73 dB.  Off the cuff remarks, 71 dB improving to 75 dB with min verbal cues. Homework: Homework was completed and additional work given.     Assessment / Recommendations / Plan   Plan Continue with current plan of care     Progression Toward Goals   Progression toward goals Progressing toward goals          SLP Education - 06/21/16 1212    Education provided Yes   Education Details LSVT-LOUD   Person(s) Educated Patient   Methods Explanation   Comprehension Verbalized understanding            SLP Long Term Goals - 05/30/16 1237  SLP LONG TERM GOAL #1   Title The patient will complete Daily Tasks (Maximum duration "ah", High/Lows, and Functional Phrases) at average loudness of 80 dB and with loud, good quality voice.    Time 4   Period Weeks   Status New     SLP LONG TERM GOAL #2   Title The patient will complete Hierarchal Speech Loudness reading drills (words/phrases, sentences, and paragraph) at average 75 dB and with loud, good quality voice.     Time 4   Period Weeks   Status New     SLP LONG TERM GOAL #3   Title The patient will participate in conversation, maintaining average loudness of 75 dB and loud, good quality voice.   Time 4   Period Weeks   Status New     SLP LONG TERM GOAL #4   Title The patient will complete homework daily.   Time 4   Period Weeks   Status New           Plan - 06/21/16 1213    Clinical Impression Statement The patient is completing daily tasks and hierarchal speech drill tasks with loud, good quality voice. The patient is demonstrating generalization into conversational speech.   Speech Therapy Frequency 4x / week   Duration 4 weeks   Treatment/Interventions Patient/family education;Other (comment);SLP instruction and feedback  LSVT-LOUD   Potential to Achieve Goals Good   Potential Considerations Ability to learn/carryover information;Co-morbidities;Cooperation/participation level;Previous level of function;Severity of impairments;Family/community support   SLP Home Exercise Plan LSVT-LOUD daily homework   Consulted and Agree with Plan of Care Patient      Patient will benefit from skilled therapeutic intervention in order to improve the following deficits and impairments:   Dysphonia    Problem List Patient Active Problem List   Diagnosis Date Noted  . Renal cyst, right 06/06/2016  . Deep vein thrombosis (DVT) of femoral vein of right lower extremity ([place]) 03/05/2016  . Elevated troponin 03/05/2016  . CKD (chronic kidney disease) stage 3, GFR 30-59 ml/min 03/05/2016  . Acute saddle pulmonary embolism without acute cor pulmonale (HCC) 03/03/2016  . Vitamin B12 deficiency 09/09/2014  . Preventative health care 08/04/2014  . Parkinson's disease ([place]) 08/04/2014  . Esophageal diverticulum 01/01/2014  . Actinic keratosis 07/31/2013  . Hypertension   . Osteoarthritis of both knees   . Toe amputation status ([place])   . Benign prostatic hyperplasia   . Kidney stones   . Hypothyroid   . Tendonitis    [name], MS/CCC- SLP  Lou Miner 06/21/2016, 12:14 PM  [place] MAIN [facility] SERVICES [address] [place], [place], [address] Phone: [phone]   Fax:  [phone]   Name: Walter [name] MRN: [id number] Date of Birth: [birth year]

## 2016-06-22 ENCOUNTER — Encounter: Payer: Self-pay | Admitting: Speech Pathology

## 2016-06-22 ENCOUNTER — Ambulatory Visit: Payer: Medicare HMO | Admitting: Speech Pathology

## 2016-06-22 ENCOUNTER — Encounter: Payer: Self-pay | Admitting: Occupational Therapy

## 2016-06-22 ENCOUNTER — Ambulatory Visit: Payer: Medicare HMO | Admitting: Occupational Therapy

## 2016-06-22 DIAGNOSIS — R2681 Unsteadiness on feet: Secondary | ICD-10-CM | POA: Diagnosis not present

## 2016-06-22 DIAGNOSIS — R490 Dysphonia: Secondary | ICD-10-CM

## 2016-06-22 DIAGNOSIS — M6281 Muscle weakness (generalized): Secondary | ICD-10-CM

## 2016-06-22 DIAGNOSIS — R278 Other lack of coordination: Secondary | ICD-10-CM | POA: Diagnosis not present

## 2016-06-22 DIAGNOSIS — R262 Difficulty in walking, not elsewhere classified: Secondary | ICD-10-CM | POA: Diagnosis not present

## 2016-06-22 NOTE — Therapy (Signed)
[place] MAIN [facility] SERVICES [address] [place], [place], [address] Phone: [phone]   Fax:  [phone]  Occupational Therapy Treatment  Patient Details  Name: Walter [name] MRN: [id number] Date of Birth: [birth year] Referring Provider: Tat  Encounter Date: 06/21/2016      OT End of Session - 06/21/16 1523    Visit Number 11   Number of Visits 17   Date for OT Re-Evaluation 07/08/16   Authorization Type Medicare G code 11   OT Start Time 0955   OT Stop Time 1054   OT Time Calculation (min) 59 min   Activity Tolerance Patient tolerated treatment well   Behavior During Therapy [facility] for tasks assessed/performed      Past Medical History:  Diagnosis Date  . ARF (acute renal failure) ([place]) 2011   after TKR  . BPH (benign prostatic hypertrophy)   . DVT (deep venous thrombosis) ([place]) 1998   after left knee arthroscopy  . Hx of colonoscopy 2000  . Hypertension   . Hypothyroid 2007   postop from thyroidectomy (cancer scare)  . Kidney stones    recurrent  . Oral cancer ([place]) 2004   graft from left thigh  . Osteoarthritis of both knees   . Parkinson's disease ([place])   . Tendonitis 2012   from quinolone  . Toe amputation status ([place]) 2010    Past Surgical History:  Procedure Laterality Date  . APPENDECTOMY    . CHOLECYSTECTOMY    . HERNIA REPAIR    . LITHOTRIPSY  2009/2010   then stent and cystoscopic removal 2014  . Oral cancer removed Right 2004  . REPAIR ZENKER'S DIVERTICULA  2013  . THYROIDECTOMY  2007  . TOE AMPUTATION Right 2010   badly overlapping other toe  . TOTAL KNEE ARTHROPLASTY Left   . TRANSURETHRAL RESECTION OF PROSTATE  2011  . UMBILICAL HERNIA REPAIR  2007    There were no vitals filed for this visit.      Subjective Assessment - 06/21/16 1519    Subjective  Patient reports he is still planning to finish up with therapy this week, states, "I am not going to change my mind."  Attributes stopping early  due to copays.    Pertinent History Patient was diagnosed with Parkinson's disease about 1 year ago, reports he also was diagnosed with tendonitis in bilateral lower extremities, recently had blood clots in his lung 03-03-16, right foot with previous second toe removal.  He also has chronic back pain.     Patient Stated Goals Patient reports he would like to be able to play golf again, take care of himself, be active.    Currently in Pain? No/denies   Pain Score 0-No pain                      OT Treatments/Exercises (OP) - 06/21/16 1558      ADLs   ADL Comments Functional component tasks as follows: 1)Transitions from sit to stand from mat table with supervision and occasional cues for technique, 2) reaching down to tie shoes for 5 repetitions, 3) posture with wall stretches and verbal cues for scapular retraction and head up 5 repetitions, 4) toe taps on steps to encourage amplitude of step as well as manage stairs with CGA and cues for 5 repetitions, 5) turning head to the right to view traffic for lane mergers with cues, 5 repetitions (performed seated to not incorporate weight shifts at hips).  Neurological Re-education Exercises   Other Exercises 1 Patient seen for instruction of LSVT BIG exercises: LSVT Daily Session Maximal Daily Exercises: Sustained movements are designed to rescale the amplitude of movement output for generalization to daily functional activities. Performed as follows for 1 set of 10 repetitions each: Multi directional sustained movements- 1) Floor to ceiling, 2) Side to side. Multi directional Repetitive movements performed in standing and are designed to provide retraining effort needed for sustained muscle activation in tasks Performed as follows: 3) Step and reach forward, 4) Step and Reach Backwards, 5) Step and reach sideways, 6) Rock and reach forward/backward, 7) Rock and reach sideways. Minimal verbal cues with all exercises and tactile cue for stepping  backwards to shift weight back onto back foot. Sit to stand from mat table on lowest setting with cues for weight shift, technique for 10 reps for 1 set, SBA. Patient performing reciprocal stepping exercise with SBA and cues for 10 reps each foot, stair negotiation 4 steps for 5 reps each, cues for big turns, SBA.  Focus on balance tasks this date with use of balance pad in standing while engaging in toss/catch with dynamic reaching/weight shifting with CGA to min assist.   Other Exercises 2 Patient seen for functional mobility tasks indoors, flat surface for 2 trials of 400 feet each with cues for amplitude of step as well as reciprocal arm swing.  Focused on turning behaviors incorporating larger steps with cues.                  OT Education - 06/21/16 1523    Education provided Yes   Education Details maximal daily exercises and frequency   Person(s) Educated Patient   Methods Explanation;Demonstration;Verbal cues   Comprehension Verbal cues required;Returned demonstration;Verbalized understanding             OT Long Term Goals - 06/20/16 1550      OT LONG TERM GOAL #1   Title Patient will improve gait speed and endurance and be able to walk 1200 feet in 6 minutes to negotiate around the home and community safely in 4 weeks   Baseline 1040 feet at evaluation   Time 4   Period Weeks   Status On-going     OT LONG TERM GOAL #2   Title Patient will complete HEP for maximal daily exercises with modified independence in 4 weeks   Baseline no current home program at eval   Time 4   Period Weeks   Status On-going     OT LONG TERM GOAL #3   Title Patient will transfer from sit to stand without the use of arms safely and independently from a variety of chairs/surfaces in 4 weeks.    Baseline difficulty from low surfaces   Time 4   Period Weeks   Status On-going     OT LONG TERM GOAL #4   Title Patient will complete basic self care tasks with modified independence with  good speed in order to be on time for all appointments.    Baseline patient slow to complete tasks and occasionally requires assist to meet deadlines/appts.   Time 4   Period Weeks   Status On-going     OT LONG TERM GOAL #5   Title Patient will demonstrate improved grip strength right hand by 5# to open jars and containers with modified independence.    Baseline difficulty with opening jars and containers at eval   Time 4   Period Weeks  Status On-going               Plan - 06/21/16 1524    Clinical Impression Statement Patient continues to progress in all areas.  He is aware of stopping his treatment early may affect the outcomes of his progress.  He continues to demonstrate impairments with balance and amplitude of gait which affects his risk for falls. He is becoming more consistent at the performance of exercises in the clinic with improved form with decreased cues however, he still has not been consistent with performance of exercises at home.  Will continue to work towards improving size of steps in height and length as well as balance to reduce risk for falls and improve independence in daily tasks.    Rehab Potential Good   Clinical Impairments Affecting Rehab Potential positive:  motivation, family support. Negative: progressive disease process, medical co morbidities   OT Frequency 4x / week   OT Duration 4 weeks   OT Treatment/Interventions Self-care/ADL training;DME and/or AE instruction;Patient/family education;Gait Training;Therapeutic exercises;Balance training;Therapeutic exercise;Stair Training;Therapeutic activities;Neuromuscular education;Functional Mobility Training;Manual Therapy   Consulted and Agree with Plan of Care Patient      Patient will benefit from skilled therapeutic intervention in order to improve the following deficits and impairments:  Abnormal gait, Decreased coordination, Decreased range of motion, Difficulty walking, Improper body mechanics,  Decreased endurance, Decreased activity tolerance, Decreased balance, Impaired UE functional use, Pain, Decreased mobility, Decreased strength, Decreased knowledge of use of DME  Visit Diagnosis: Difficulty in walking, not elsewhere classified  Muscle weakness (generalized)  Other lack of coordination  Unsteadiness on feet    Problem List Patient Active Problem List   Diagnosis Date Noted  . Renal cyst, right 06/06/2016  . Deep vein thrombosis (DVT) of femoral vein of right lower extremity ([place]) 03/05/2016  . Elevated troponin 03/05/2016  . CKD (chronic kidney disease) stage 3, GFR 30-59 ml/min 03/05/2016  . Acute saddle pulmonary embolism without acute cor pulmonale (HCC) 03/03/2016  . Vitamin B12 deficiency 09/09/2014  . Preventative health care 08/04/2014  . Parkinson's disease ([place]) 08/04/2014  . Esophageal diverticulum 01/01/2014  . Actinic keratosis 07/31/2013  . Hypertension   . Osteoarthritis of both knees   . Toe amputation status ([place])   . Benign prostatic hyperplasia   . Kidney stones   . Hypothyroid   . Tendonitis    Amy Oneita Jolly, OTR/L, CLT  Lovett,Amy 06/22/2016, 4:06 PM  [place] MAIN [facility] SERVICES [address] [place], [place], [address] Phone: [phone]   Fax:  [phone]  Name: Jaxsun Ciampi MRN: [id number] Date of Birth: [birth year]

## 2016-06-22 NOTE — Therapy (Signed)
[place] MAIN [facility] SERVICES [address] [place], [place], [address] Phone: [phone]   Fax:  [phone]  Speech Language Pathology Treatment  Patient Details  Name: Walter [name] MRN: [id number] Date of Birth: 09/19/1931 Referring Provider: Ludwig Clarks  Encounter Date: 06/22/2016      End of Session - 06/22/16 1213    Visit Number 8   Number of Visits 17   Date for SLP Re-Evaluation 07/01/16   SLP Start Time 1100   SLP Stop Time  1157   SLP Time Calculation (min) 57 min   Activity Tolerance Patient tolerated treatment well      Past Medical History:  Diagnosis Date  . ARF (acute renal failure) ([place]) 2011   after TKR  . BPH (benign prostatic hypertrophy)   . DVT (deep venous thrombosis) ([place]) 1998   after left knee arthroscopy  . Hx of colonoscopy 2000  . Hypertension   . Hypothyroid 2007   postop from thyroidectomy (cancer scare)  . Kidney stones    recurrent  . Oral cancer ([place]) 2004   graft from left thigh  . Osteoarthritis of both knees   . Parkinson's disease ([place])   . Tendonitis 2012   from quinolone  . Toe amputation status ([place]) 2010    Past Surgical History:  Procedure Laterality Date  . APPENDECTOMY    . CHOLECYSTECTOMY    . HERNIA REPAIR    . LITHOTRIPSY  2009/2010   then stent and cystoscopic removal 2014  . Oral cancer removed Right 2004  . REPAIR ZENKER'S DIVERTICULA  2013  . THYROIDECTOMY  2007  . TOE AMPUTATION Right 2010   badly overlapping other toe  . TOTAL KNEE ARTHROPLASTY Left   . TRANSURETHRAL RESECTION OF PROSTATE  2011  . UMBILICAL HERNIA REPAIR  2007    There were no vitals filed for this visit.      Subjective Assessment - 06/22/16 1213    Subjective The patient reports that he was able to sing louder and read louder at church   Currently in Pain? No/denies               ADULT SLP TREATMENT - 06/22/16 0001      General Information   Behavior/Cognition  Alert;Cooperative;Pleasant mood   HPI Parkinson's disease     Treatment Provided   Treatment provided Cognitive-Linquistic     Pain Assessment   Pain Assessment No/denies pain     Cognitive-Linquistic Treatment   Treatment focused on Voice  LSVT LOUD   Skilled Treatment LSVT LOUD Protocol: Daily task #1 Maximum sustained "ah" - Average SPL - 85 dB; sustained for average of 9 seconds; Daily Task #2 Maximum fundamental frequency range; Highs: 15 high pitched "ah" independently; Lows: 15 low pitched "ah" independently. Daily Task #3 Maximum speech loudness drill with functional phrases. Average SPL - 75 dB. Hierarchal speech loudness drills: Lengthy reading, 75 dB. Structured conversation, 73 dB.  Off the cuff remarks, 71 dB improving to 75 dB with min verbal cues. Homework: Homework was completed and additional work given.     Assessment / Recommendations / Plan   Plan Continue with current plan of care     Progression Toward Goals   Progression toward goals Progressing toward goals          SLP Education - 06/22/16 1213    Education Details LSVT-LOUD   Person(s) Educated Patient   Methods Explanation   Comprehension Verbalized understanding  SLP Long Term Goals - 05/30/16 1237      SLP LONG TERM GOAL #1   Title The patient will complete Daily Tasks (Maximum duration "ah", High/Lows, and Functional Phrases) at average loudness of 80 dB and with loud, good quality voice.    Time 4   Period Weeks   Status New     SLP LONG TERM GOAL #2   Title The patient will complete Hierarchal Speech Loudness reading drills (words/phrases, sentences, and paragraph) at average 75 dB and with loud, good quality voice.     Time 4   Period Weeks   Status New     SLP LONG TERM GOAL #3   Title The patient will participate in conversation, maintaining average loudness of 75 dB and loud, good quality voice.   Time 4   Period Weeks   Status New     SLP LONG TERM GOAL #4   Title  The patient will complete homework daily.   Time 4   Period Weeks   Status New          Plan - 06/22/16 1214    Clinical Impression Statement The patient is completing daily tasks and hierarchal speech drill tasks with loud, good quality voice. The patient is demonstrating generalization into conversational speech.   Speech Therapy Frequency 4x / week   Duration 4 weeks   Treatment/Interventions Patient/family education;Other (comment);SLP instruction and feedback  LSVT-LOUD   Potential to Achieve Goals Good   Potential Considerations Ability to learn/carryover information;Co-morbidities;Cooperation/participation level;Previous level of function;Severity of impairments;Family/community support   SLP Home Exercise Plan LSVT-LOUD daily homework   Consulted and Agree with Plan of Care Patient      Patient will benefit from skilled therapeutic intervention in order to improve the following deficits and impairments:   Dysphonia    Problem List Patient Active Problem List   Diagnosis Date Noted  . Renal cyst, right 06/06/2016  . Deep vein thrombosis (DVT) of femoral vein of right lower extremity ([place]) 03/05/2016  . Elevated troponin 03/05/2016  . CKD (chronic kidney disease) stage 3, GFR 30-59 ml/min 03/05/2016  . Acute saddle pulmonary embolism without acute cor pulmonale (HCC) 03/03/2016  . Vitamin B12 deficiency 09/09/2014  . Preventative health care 08/04/2014  . Parkinson's disease ([place]) 08/04/2014  . Esophageal diverticulum 01/01/2014  . Actinic keratosis 07/31/2013  . Hypertension   . Osteoarthritis of both knees   . Toe amputation status ([place])   . Benign prostatic hyperplasia   . Kidney stones   . Hypothyroid   . Tendonitis    [name], MS/CCC- SLP  Lou Miner 06/22/2016, 12:15 PM  [place] MAIN [facility] SERVICES [address] [place], [place], [address] Phone: [phone]   Fax:  [phone]   Name:  Koren Sermersheim MRN: [id number] Date of Birth: [birth year]

## 2016-06-23 ENCOUNTER — Ambulatory Visit: Payer: Medicare HMO | Admitting: Speech Pathology

## 2016-06-23 ENCOUNTER — Encounter: Payer: Self-pay | Admitting: Speech Pathology

## 2016-06-23 ENCOUNTER — Ambulatory Visit: Payer: Medicare HMO | Admitting: Occupational Therapy

## 2016-06-23 DIAGNOSIS — R262 Difficulty in walking, not elsewhere classified: Secondary | ICD-10-CM

## 2016-06-23 DIAGNOSIS — R2681 Unsteadiness on feet: Secondary | ICD-10-CM | POA: Diagnosis not present

## 2016-06-23 DIAGNOSIS — R490 Dysphonia: Secondary | ICD-10-CM

## 2016-06-23 DIAGNOSIS — M6281 Muscle weakness (generalized): Secondary | ICD-10-CM | POA: Diagnosis not present

## 2016-06-23 DIAGNOSIS — R278 Other lack of coordination: Secondary | ICD-10-CM

## 2016-06-23 NOTE — Therapy (Signed)
[place] MAIN [facility] SERVICES [address] [place], [place], [address] Phone: [phone]   Fax:  [phone]  Speech Language Pathology Treatment/Discharge Summary  Patient Details  Name: Walter [name] MRN: [id number] Date of Birth: Jul 14, 1931 Referring Provider: Ludwig Clarks  Encounter Date: 06/23/2016      End of Session - 06/23/16 1249    Visit Number 9   Number of Visits 17   Date for SLP Re-Evaluation 07/01/16   SLP Start Time 1100   SLP Stop Time  1155   SLP Time Calculation (min) 55 min   Activity Tolerance Patient tolerated treatment well      Past Medical History:  Diagnosis Date  . ARF (acute renal failure) ([place]) 2011   after TKR  . BPH (benign prostatic hypertrophy)   . DVT (deep venous thrombosis) ([place]) 1998   after left knee arthroscopy  . Hx of colonoscopy 2000  . Hypertension   . Hypothyroid 2007   postop from thyroidectomy (cancer scare)  . Kidney stones    recurrent  . Oral cancer ([place]) 2004   graft from left thigh  . Osteoarthritis of both knees   . Parkinson's disease ([place])   . Tendonitis 2012   from quinolone  . Toe amputation status ([place]) 2010    Past Surgical History:  Procedure Laterality Date  . APPENDECTOMY    . CHOLECYSTECTOMY    . HERNIA REPAIR    . LITHOTRIPSY  2009/2010   then stent and cystoscopic removal 2014  . Oral cancer removed Right 2004  . REPAIR ZENKER'S DIVERTICULA  2013  . THYROIDECTOMY  2007  . TOE AMPUTATION Right 2010   badly overlapping other toe  . TOTAL KNEE ARTHROPLASTY Left   . TRANSURETHRAL RESECTION OF PROSTATE  2011  . UMBILICAL HERNIA REPAIR  2007    There were no vitals filed for this visit.      Subjective Assessment - 06/23/16 1249    Subjective The patient reports that he was able to sing louder and read louder at church   Currently in Pain? No/denies               ADULT SLP TREATMENT - 06/23/16 0001      General Information   Behavior/Cognition Alert;Cooperative;Pleasant mood   HPI Parkinson's disease     Treatment Provided   Treatment provided Cognitive-Linquistic     Pain Assessment   Pain Assessment No/denies pain     Cognitive-Linquistic Treatment   Treatment focused on Voice  LSVT LOUD   Skilled Treatment LSVT LOUD Protocol: Daily task #1 Maximum sustained "ah" - Average SPL - 85 dB; sustained for average of 9 seconds; Daily Task #2 Maximum fundamental frequency range; Highs: 15 high pitched "ah" independently; Lows: 15 low pitched "ah" independently. Daily Task #3 Maximum speech loudness drill with functional phrases. Average SPL - 75 dB. Hierarchal speech loudness drills: Lengthy reading, 75 dB. Structured conversation, 73 dB.  Off the cuff remarks, 71 dB improving to 75 dB with min verbal cues. Homework: Homework was completed.      Assessment / Recommendations / Plan   Plan Discharge SLP treatment due to (comment)  Patient request     Progression Toward Goals   Progression toward goals Goals met, education completed, patient discharged from SLP          SLP Education - 06/23/16 1249    Education provided Yes   Education Details LSVT-LOUD, importance of continuing to practice everyday  Person(s) Educated Patient   Methods Explanation   Comprehension Verbalized understanding            SLP Long Term Goals - Jul 22, 2016 1254      SLP LONG TERM GOAL #1   Title The patient will complete Daily Tasks (Maximum duration "ah", High/Lows, and Functional Phrases) at average loudness of 80 dB and with loud, good quality voice.    Time 4   Period Weeks   Status Achieved     SLP LONG TERM GOAL #2   Title The patient will complete Hierarchal Speech Loudness reading drills (words/phrases, sentences, and paragraph) at average 75 dB and with loud, good quality voice.     Time 4   Period Weeks   Status Achieved     SLP LONG TERM GOAL #3   Title The patient will participate in conversation,  maintaining average loudness of 75 dB and loud, good quality voice.   Time 4   Period Weeks   Status Achieved     SLP LONG TERM GOAL #4   Title The patient will complete homework daily.   Time 4   Period Weeks   Status Achieved          Plan - 07/22/16 1250    Clinical Impression Statement The patient has requested early discharge due to family responsibilities.  He completed  of the standard LSVT-LOUD program. However, the patient is completing daily tasks and hierarchal speech drill tasks with loud, good quality voice and is demonstrating generalization into conversational speech.     Speech Therapy Frequency Other (comment)  Discharge   Treatment/Interventions Patient/family education;Other (comment);SLP instruction and feedback  LSVT-LOUD   Potential to Achieve Goals Good   Potential Considerations Ability to learn/carryover information;Co-morbidities;Cooperation/participation level;Previous level of function;Severity of impairments;Family/community support   SLP Home Exercise Plan Continue LSVT-LOUD daily homework   Consulted and Agree with Plan of Care Patient      Patient will benefit from skilled therapeutic intervention in order to improve the following deficits and impairments:   Dysphonia      G-Codes - 07-22-16 1255    Functional Assessment Tool Used LSVT-LOUD protocol, clinical judgment   Functional Limitations Voice   Voice Current Status (G9171) At least 1 percent but less than 20 percent impaired, limited or restricted   Voice Goal Status (B3419) At least 1 percent but less than 20 percent impaired, limited or restricted   Voice Discharge Status (F7902) At least 1 percent but less than 20 percent impaired, limited or restricted      Problem List Patient Active Problem List   Diagnosis Date Noted  . Renal cyst, right 06/06/2016  . Deep vein thrombosis (DVT) of femoral vein of right lower extremity ([place]) 03/05/2016  . Elevated troponin 03/05/2016  . CKD  (chronic kidney disease) stage 3, GFR 30-59 ml/min 03/05/2016  . Acute saddle pulmonary embolism without acute cor pulmonale (HCC) 03/03/2016  . Vitamin B12 deficiency 09/09/2014  . Preventative health care 08/04/2014  . Parkinson's disease ([place]) 08/04/2014  . Esophageal diverticulum 01/01/2014  . Actinic keratosis 07/31/2013  . Hypertension   . Osteoarthritis of both knees   . Toe amputation status ([place])   . Benign prostatic hyperplasia   . Kidney stones   . Hypothyroid   . Tendonitis    [name], MS/CCC- SLP  Lou Miner 07-22-16, 12:56 PM  [place] MAIN [facility] SERVICES [address] [place], [place], [address] Phone: [phone]  Fax:  [phone]   Name: Shen[place]ah Vandergriff MRN: [id number] Date of Birth: Jul 31, 1931

## 2016-06-24 NOTE — Therapy (Signed)
[place] MAIN [facility] SERVICES [address] [place], [place], [address] Phone: [phone]   Fax:  [phone]  Occupational Therapy Treatment  Patient Details  Name: Anhad Sheeley MRN: [id number] Date of Birth: 1931-05-15 Referring Provider: Tat  Encounter Date: 06/22/2016      OT End of Session - 06/24/16 1030    Visit Number 12   Number of Visits 17   Date for OT Re-Evaluation 07/08/16   Authorization Type Medicare G code 12   OT Start Time 1000   OT Stop Time 1100   OT Time Calculation (min) 60 min   Activity Tolerance Patient tolerated treatment well   Behavior During Therapy [facility] for tasks assessed/performed      Past Medical History:  Diagnosis Date  . ARF (acute renal failure) ([place]) 2011   after TKR  . BPH (benign prostatic hypertrophy)   . DVT (deep venous thrombosis) ([place]) 1998   after left knee arthroscopy  . Hx of colonoscopy 2000  . Hypertension   . Hypothyroid 2007   postop from thyroidectomy (cancer scare)  . Kidney stones    recurrent  . Oral cancer ([place]) 2004   graft from left thigh  . Osteoarthritis of both knees   . Parkinson's disease ([place])   . Tendonitis 2012   from quinolone  . Toe amputation status ([place]) 2010    Past Surgical History:  Procedure Laterality Date  . APPENDECTOMY    . CHOLECYSTECTOMY    . HERNIA REPAIR    . LITHOTRIPSY  2009/2010   then stent and cystoscopic removal 2014  . Oral cancer removed Right 2004  . REPAIR ZENKER'S DIVERTICULA  2013  . THYROIDECTOMY  2007  . TOE AMPUTATION Right 2010   badly overlapping other toe  . TOTAL KNEE ARTHROPLASTY Left   . TRANSURETHRAL RESECTION OF PROSTATE  2011  . UMBILICAL HERNIA REPAIR  2007    There were no vitals filed for this visit.      Subjective Assessment - 06/24/16 1026    Subjective  Patient reports he is doing well, today.  Wife had to go to the doctor while patient is at therapy.  Patient states, "I really don't realize how  bad my balance is until we do these activities."   Pertinent History Patient was diagnosed with Parkinson's disease about 1 year ago, reports he also was diagnosed with tendonitis in bilateral lower extremities, recently had blood clots in his lung 03-03-16, right foot with previous second toe removal.  He also has chronic back pain.     Patient Stated Goals Patient reports he would like to be able to play golf again, take care of himself, be active.    Currently in Pain? No/denies   Pain Score 0-No pain                      OT Treatments/Exercises (OP) - 06/24/16 1027      ADLs   ADL Comments Functional component tasks as follows: 1)Transitions from sit to stand from mat table with supervision and occasional cues for technique, 2) reaching down to tie shoes for 5 repetitions, 3) posture with wall stretches and verbal cues for scapular retraction and head up 5 repetitions, 4) toe taps on steps to encourage amplitude of step as well as manage stairs with CGA and cues for 5 repetitions, 5) turning head to the right to view traffic for lane mergers with cues, 5 repetitions (performed seated to  not incorporate weight shifts at hips).     Neurological Re-education Exercises   Other Exercises 1 Patient seen for instruction of LSVT BIG exercises: LSVT Daily Session Maximal Daily Exercises: Sustained movements are designed to rescale the amplitude of movement output for generalization to daily functional activities. Performed as follows for 1 set of 10 repetitions each: Multi directional sustained movements- 1) Floor to ceiling, 2) Side to side. Multi directional Repetitive movements performed in standing and are designed to provide retraining effort needed for sustained muscle activation in tasks Performed as follows: 3) Step and reach forward, 4) Step and Reach Backwards, 5) Step and reach sideways, 6) Rock and reach forward/backward, 7) Rock and reach sideways. Minimal verbal cues with all  exercises and tactile cue for stepping backwards to shift weight back onto back foot (occasional guiding from therapist at hips to achieve weight shift). Sit to stand from mat table on lowest setting with cues for weight shift, technique for 10 reps for 1 set. Patient performing reciprocal stepping exercise with SBA and cues for 10 reps each foot, stair negotiation 4 steps for 5 reps each, cues for big turns, SBA. Focus on balance tasks this date with heel to toe walking on line flat surface with CGA to min assist, moderate difficulty noted.    Other Exercises 2 Patient seen for functional mobility tasks indoors, flat surface for 2 trials of 450 feet each with cues for amplitude of step as well as reciprocal arm swing. Focused on turning behaviors incorporating larger steps with cues.                 OT Education - 06/24/16 1029    Education provided Yes   Education Details balance during functional tasks, use of chair for adapted exercises at home.   Person(s) Educated Patient   Methods Explanation;Demonstration;Verbal cues   Comprehension Verbal cues required;Returned demonstration;Verbalized understanding             OT Long Term Goals - 06/20/16 1550      OT LONG TERM GOAL #1   Title Patient will improve gait speed and endurance and be able to walk 1200 feet in 6 minutes to negotiate around the home and community safely in 4 weeks   Baseline 1040 feet at evaluation   Time 4   Period Weeks   Status On-going     OT LONG TERM GOAL #2   Title Patient will complete HEP for maximal daily exercises with modified independence in 4 weeks   Baseline no current home program at eval   Time 4   Period Weeks   Status On-going     OT LONG TERM GOAL #3   Title Patient will transfer from sit to stand without the use of arms safely and independently from a variety of chairs/surfaces in 4 weeks.    Baseline difficulty from low surfaces   Time 4   Period Weeks   Status On-going      OT LONG TERM GOAL #4   Title Patient will complete basic self care tasks with modified independence with good speed in order to be on time for all appointments.    Baseline patient slow to complete tasks and occasionally requires assist to meet deadlines/appts.   Time 4   Period Weeks   Status On-going     OT LONG TERM GOAL #5   Title Patient will demonstrate improved grip strength right hand by 5# to open jars and containers with modified independence.  Baseline difficulty with opening jars and containers at eval   Time 4   Period Weeks   Status On-going               Plan - 06/24/16 1030    Clinical Impression Statement Although patient  has made good progress with performance of LSVT BIG exercises, functional mobility and balance, he feels he will have to discontinue the program due to financial reasons with co pays.  Patient understands the implications of stopping program early and this may affect his outcomes.  Will plan to finalize home program next session and discharge from therapy per patient request.     Rehab Potential Good   Clinical Impairments Affecting Rehab Potential positive:  motivation, family support. Negative: progressive disease process, medical co morbidities   OT Frequency 4x / week   OT Duration 4 weeks   OT Treatment/Interventions Self-care/ADL training;DME and/or AE instruction;Patient/family education;Gait Training;Therapeutic exercises;Balance training;Therapeutic exercise;Stair Training;Therapeutic activities;Neuromuscular education;Functional Mobility Training;Manual Therapy   Consulted and Agree with Plan of Care Patient      Patient will benefit from skilled therapeutic intervention in order to improve the following deficits and impairments:  Abnormal gait, Decreased coordination, Decreased range of motion, Difficulty walking, Improper body mechanics, Decreased endurance, Decreased activity tolerance, Decreased balance, Impaired UE functional use,  Pain, Decreased mobility, Decreased strength, Decreased knowledge of use of DME  Visit Diagnosis: Difficulty in walking, not elsewhere classified  Unsteadiness on feet  Muscle weakness (generalized)  Other lack of coordination    Problem List Patient Active Problem List   Diagnosis Date Noted  . Renal cyst, right 06/06/2016  . Deep vein thrombosis (DVT) of femoral vein of right lower extremity ([place]) 03/05/2016  . Elevated troponin 03/05/2016  . CKD (chronic kidney disease) stage 3, GFR 30-59 ml/min 03/05/2016  . Acute saddle pulmonary embolism without acute cor pulmonale (HCC) 03/03/2016  . Vitamin B12 deficiency 09/09/2014  . Preventative health care 08/04/2014  . Parkinson's disease ([place]) 08/04/2014  . Esophageal diverticulum 01/01/2014  . Actinic keratosis 07/31/2013  . Hypertension   . Osteoarthritis of both knees   . Toe amputation status ([place])   . Benign prostatic hyperplasia   . Kidney stones   . Hypothyroid   . Tendonitis    Brinsley Wence Oneita Jolly, OTR/L, CLT Maisen Schmit 06/24/2016, 10:32 AM  [place] MAIN [facility] SERVICES [address] [place], [place], [address] Phone: [phone]   Fax:  [phone]  Name: Michae Grimley MRN: [id number] Date of Birth: [birth year]

## 2016-06-24 NOTE — Therapy (Signed)
[place] MAIN [facility] SERVICES [address] [place], [place], [address] Phone: [phone]   Fax:  [phone]  Occupational Therapy Treatment/Discharge Summary  Patient Details  Name: Walter [name] MRN: [id number] Date of Birth: December 01, 1931 Referring Provider: Tat  Encounter Date: 06/23/2016      OT End of Session - 06/24/16 1709    Visit Number 13   Number of Visits 17   Date for OT Re-Evaluation 07/08/16   Authorization Type Medicare G code 13   OT Start Time 0955   OT Stop Time 1100   OT Time Calculation (min) 65 min   Activity Tolerance Patient tolerated treatment well   Behavior During Therapy [facility] for tasks assessed/performed      Past Medical History:  Diagnosis Date  . ARF (acute renal failure) ([place]) 2011   after TKR  . BPH (benign prostatic hypertrophy)   . DVT (deep venous thrombosis) ([place]) 1998   after left knee arthroscopy  . Hx of colonoscopy 2000  . Hypertension   . Hypothyroid 2007   postop from thyroidectomy (cancer scare)  . Kidney stones    recurrent  . Oral cancer ([place]) 2004   graft from left thigh  . Osteoarthritis of both knees   . Parkinson's disease ([place])   . Tendonitis 2012   from quinolone  . Toe amputation status ([place]) 2010    Past Surgical History:  Procedure Laterality Date  . APPENDECTOMY    . CHOLECYSTECTOMY    . HERNIA REPAIR    . LITHOTRIPSY  2009/2010   then stent and cystoscopic removal 2014  . Oral cancer removed Right 2004  . REPAIR ZENKER'S DIVERTICULA  2013  . THYROIDECTOMY  2007  . TOE AMPUTATION Right 2010   badly overlapping other toe  . TOTAL KNEE ARTHROPLASTY Left   . TRANSURETHRAL RESECTION OF PROSTATE  2011  . UMBILICAL HERNIA REPAIR  2007    There were no vitals filed for this visit.      Subjective Assessment - 06/24/16 1026    Subjective  Patient reports he is doing well, today.  Wife had to go to the doctor while patient is at therapy.  Patient states, "I really  don't realize how bad my balance is until we do these activities."   Pertinent History Patient was diagnosed with Parkinson's disease about 1 year ago, reports he also was diagnosed with tendonitis in bilateral lower extremities, recently had blood clots in his lung 03-03-16, right foot with previous second toe removal.  He also has chronic back pain.     Patient Stated Goals Patient reports he would like to be able to play golf again, take care of himself, be active.    Currently in Pain? No/denies   Pain Score 0-No pain                      OT Treatments/Exercises (OP) - 06/24/16 1704      Neurological Re-education Exercises   Other Exercises 1 Patient seen for instruction of LSVT BIG exercises: LSVT Daily Session Maximal Daily Exercises: Sustained movements are designed to rescale the amplitude of movement output for generalization to daily functional activities. Performed as follows for 1 set of 10 repetitions each: Multi directional sustained movements- 1) Floor to ceiling, 2) Side to side. Multi directional Repetitive movements performed in standing and are designed to provide retraining effort needed for sustained muscle activation in tasks Performed as follows: 3) Step and reach forward,  4) Step and Reach Backwards, 5) Step and reach sideways, 6) Rock and reach forward/backward, 7) Rock and reach sideways. Occasional verbal cues with all exercises and tactile cue for stepping backwards to shift weight back onto back foot (occasional guiding from therapist at hips to achieve weight shift). Sit to stand from mat table on lowest setting for 10 reps for 1 set. Patient performing reciprocal stepping exercise modified independent for 10 reps each foot, stair negotiation 4 steps for 5 reps each, cues for big turns, modified independent. Final instruction on LSVT BIG forever exercises with maximal daily exercises performed at a minimum of 1 time a day although therapist recommends continuing  with 2 times a day for the next week since patient is stopping program a week early.    Other Exercises 2 6 minute walk test 1150 feet, 5 times sit to stand 13 secs.                 OT Education - 06/24/16 1708    Education provided Yes   Education Details LSVT BIG maximal daily exercises (forever exercises 1 time a day), posture exercises.   Person(s) Educated Patient   Methods Explanation;Demonstration;Verbal cues   Comprehension Verbal cues required;Returned demonstration;Verbalized understanding             OT Long Term Goals - 06/24/16 1715      OT LONG TERM GOAL #1   Title Patient will improve gait speed and endurance and be able to walk 1200 feet in 6 minutes to negotiate around the home and community safely in 4 weeks   Baseline 1040 feet at evaluation, 1150 at discharge   Time 4   Period Weeks   Status Partially Met     OT LONG TERM GOAL #2   Title Patient will complete HEP for maximal daily exercises with modified independence in 4 weeks   Baseline no current home program at eval   Time 4   Period Weeks   Status Achieved     OT LONG TERM GOAL #3   Title Patient will transfer from sit to stand without the use of arms safely and independently from a variety of chairs/surfaces in 4 weeks.    Baseline difficulty from low surfaces   Time 4   Period Weeks   Status Achieved     OT LONG TERM GOAL #4   Title Patient will complete basic self care tasks with modified independence with good speed in order to be on time for all appointments.    Baseline patient slow to complete tasks and occasionally requires assist to meet deadlines/appts.   Time 4   Period Weeks   Status Partially Met     OT LONG TERM GOAL #5   Title Patient will demonstrate improved grip strength right hand by 5# to open jars and containers with modified independence.    Baseline difficulty with opening jars and containers at eval   Time 4   Period Weeks   Status Partially Met                Plan - 06/24/16 1709    Clinical Impression Statement Patient made the decision to stop his LSVT BIG treatment one week early with reports of financial reasons/copay.  Patient reports he is pleased with therapy and the progress he has made over the last few weeks. He is able to complete maximal daily exercises with use of handouts although he benefits from additional cues at times for proper  form and technique.  He has made significant progress with 6 minute walk test and 5 times sit to stand.  His functional balance has improved but still demonstrates significant balance deficits and would benefit from additional therapy to further address these issues.  Recommend patient continue with exercises 2 times a day for 1 week and transition to 1 time a day forever exercises. Patient has requested to be discharged, goals partially met.  Patient has made improvements with amplitude of steps but needs to be consistent with implementation of LSVT BIG principles after discharge.  If pt is able to return to therapy in the future he will likely benefit from another round of LSVT BIG to further impact his balance, gait and daily function.     Rehab Potential Good   Clinical Impairments Affecting Rehab Potential positive:  motivation, family support. Negative: progressive disease process, medical co morbidities   OT Frequency 4x / week   OT Duration 4 weeks   OT Treatment/Interventions Self-care/ADL training;DME and/or AE instruction;Patient/family education;Gait Training;Therapeutic exercises;Balance training;Therapeutic exercise;Stair Training;Therapeutic activities;Neuromuscular education;Functional Mobility Training;Manual Therapy   Consulted and Agree with Plan of Care Patient      Patient will benefit from skilled therapeutic intervention in order to improve the following deficits and impairments:  Abnormal gait, Decreased coordination, Decreased range of motion, Difficulty walking, Improper  body mechanics, Decreased endurance, Decreased activity tolerance, Decreased balance, Impaired UE functional use, Pain, Decreased mobility, Decreased strength, Decreased knowledge of use of DME  Visit Diagnosis: Difficulty in walking, not elsewhere classified  Unsteadiness on feet  Muscle weakness (generalized)  Other lack of coordination      G-Codes - 07/01/16 1703    Functional Assessment Tool Used (Outpatient only) clinical judgment, ADL assessment, 6 minute walk test, 5 times sit to stand, manual muscle testing.   Functional Limitation Mobility: Walking and moving around   Mobility: Walking and Moving Around Goal Status [phone]) At least 20 percent but less than 40 percent impaired, limited or restricted   Mobility: Walking and Moving Around Discharge Status [phone]) At least 40 percent but less than 60 percent impaired, limited or restricted      Problem List Patient Active Problem List   Diagnosis Date Noted  . Renal cyst, right 06/06/2016  . Deep vein thrombosis (DVT) of femoral vein of right lower extremity ([place]) 03/05/2016  . Elevated troponin 03/05/2016  . CKD (chronic kidney disease) stage 3, GFR 30-59 ml/min 03/05/2016  . Acute saddle pulmonary embolism without acute cor pulmonale (HCC) 03/03/2016  . Vitamin B12 deficiency 09/09/2014  . Preventative health care 08/04/2014  . Parkinson's disease ([place]) 08/04/2014  . Esophageal diverticulum 01/01/2014  . Actinic keratosis 07/31/2013  . Hypertension   . Osteoarthritis of both knees   . Toe amputation status ([place])   . Benign prostatic hyperplasia   . Kidney stones   . Hypothyroid   . Tendonitis    Amy Oneita Jolly, OTR/L, CLT  Lovett,Amy 06/24/2016, 5:17 PM  [place] MAIN [facility] SERVICES [address] [place], [place], [address] Phone: [phone]   Fax:  [phone]  Name: Ryu Cerreta MRN: [id number] Date of Birth: Oct 22, 1931

## 2016-06-27 ENCOUNTER — Encounter: Payer: Medicare HMO | Admitting: Speech Pathology

## 2016-06-27 ENCOUNTER — Encounter: Payer: Medicare HMO | Admitting: Occupational Therapy

## 2016-06-28 ENCOUNTER — Encounter: Payer: Medicare HMO | Admitting: Occupational Therapy

## 2016-06-28 ENCOUNTER — Encounter: Payer: Medicare HMO | Admitting: Speech Pathology

## 2016-06-29 ENCOUNTER — Encounter: Payer: Medicare HMO | Admitting: Occupational Therapy

## 2016-06-29 ENCOUNTER — Encounter: Payer: Medicare HMO | Admitting: Speech Pathology

## 2016-06-30 ENCOUNTER — Encounter: Payer: Medicare HMO | Admitting: Speech Pathology

## 2016-06-30 ENCOUNTER — Encounter: Payer: Medicare HMO | Admitting: Occupational Therapy

## 2016-07-01 ENCOUNTER — Encounter: Payer: Medicare HMO | Admitting: Speech Pathology

## 2016-07-04 ENCOUNTER — Encounter: Payer: Medicare HMO | Admitting: Speech Pathology

## 2016-07-05 ENCOUNTER — Encounter: Payer: Medicare HMO | Admitting: Speech Pathology

## 2016-07-05 DIAGNOSIS — M9905 Segmental and somatic dysfunction of pelvic region: Secondary | ICD-10-CM | POA: Diagnosis not present

## 2016-07-05 DIAGNOSIS — M955 Acquired deformity of pelvis: Secondary | ICD-10-CM | POA: Diagnosis not present

## 2016-07-05 DIAGNOSIS — M9903 Segmental and somatic dysfunction of lumbar region: Secondary | ICD-10-CM | POA: Diagnosis not present

## 2016-07-05 DIAGNOSIS — M5416 Radiculopathy, lumbar region: Secondary | ICD-10-CM | POA: Diagnosis not present

## 2016-07-06 ENCOUNTER — Encounter: Payer: Medicare HMO | Admitting: Speech Pathology

## 2016-07-07 ENCOUNTER — Encounter: Payer: Medicare HMO | Admitting: Speech Pathology

## 2016-08-02 DIAGNOSIS — M9903 Segmental and somatic dysfunction of lumbar region: Secondary | ICD-10-CM | POA: Diagnosis not present

## 2016-08-02 DIAGNOSIS — M955 Acquired deformity of pelvis: Secondary | ICD-10-CM | POA: Diagnosis not present

## 2016-08-02 DIAGNOSIS — M5416 Radiculopathy, lumbar region: Secondary | ICD-10-CM | POA: Diagnosis not present

## 2016-08-02 DIAGNOSIS — M9905 Segmental and somatic dysfunction of pelvic region: Secondary | ICD-10-CM | POA: Diagnosis not present

## 2016-08-04 ENCOUNTER — Ambulatory Visit: Payer: Medicare HMO | Admitting: Neurology

## 2016-08-12 ENCOUNTER — Inpatient Hospital Stay (HOSPITAL_BASED_OUTPATIENT_CLINIC_OR_DEPARTMENT_OTHER): Payer: Medicare HMO | Admitting: Hematology and Oncology

## 2016-08-12 ENCOUNTER — Inpatient Hospital Stay: Payer: Medicare HMO | Attending: Hematology and Oncology

## 2016-08-12 VITALS — BP 130/78 | HR 76 | Temp 97.8°F | Resp 18 | Wt 179.3 lb

## 2016-08-12 DIAGNOSIS — N400 Enlarged prostate without lower urinary tract symptoms: Secondary | ICD-10-CM

## 2016-08-12 DIAGNOSIS — G20 Parkinson's disease: Secondary | ICD-10-CM | POA: Insufficient documentation

## 2016-08-12 DIAGNOSIS — Z85819 Personal history of malignant neoplasm of unspecified site of lip, oral cavity, and pharynx: Secondary | ICD-10-CM | POA: Insufficient documentation

## 2016-08-12 DIAGNOSIS — Z79899 Other long term (current) drug therapy: Secondary | ICD-10-CM | POA: Insufficient documentation

## 2016-08-12 DIAGNOSIS — I2692 Saddle embolus of pulmonary artery without acute cor pulmonale: Secondary | ICD-10-CM

## 2016-08-12 DIAGNOSIS — Z87891 Personal history of nicotine dependence: Secondary | ICD-10-CM

## 2016-08-12 DIAGNOSIS — I10 Essential (primary) hypertension: Secondary | ICD-10-CM

## 2016-08-12 DIAGNOSIS — Z87442 Personal history of urinary calculi: Secondary | ICD-10-CM

## 2016-08-12 DIAGNOSIS — E039 Hypothyroidism, unspecified: Secondary | ICD-10-CM | POA: Insufficient documentation

## 2016-08-12 DIAGNOSIS — Z86711 Personal history of pulmonary embolism: Secondary | ICD-10-CM

## 2016-08-12 DIAGNOSIS — I2699 Other pulmonary embolism without acute cor pulmonale: Secondary | ICD-10-CM

## 2016-08-12 DIAGNOSIS — Z86718 Personal history of other venous thrombosis and embolism: Secondary | ICD-10-CM | POA: Diagnosis not present

## 2016-08-12 DIAGNOSIS — M1990 Unspecified osteoarthritis, unspecified site: Secondary | ICD-10-CM | POA: Diagnosis not present

## 2016-08-12 DIAGNOSIS — N281 Cyst of kidney, acquired: Secondary | ICD-10-CM | POA: Insufficient documentation

## 2016-08-12 DIAGNOSIS — I82411 Acute embolism and thrombosis of right femoral vein: Secondary | ICD-10-CM

## 2016-08-12 LAB — COMPREHENSIVE METABOLIC PANEL
ALT: 5 U/L — ABNORMAL LOW (ref 17–63)
AST: 17 U/L (ref 15–41)
Albumin: 4.2 g/dL (ref 3.5–5.0)
Alkaline Phosphatase: 69 U/L (ref 38–126)
Anion gap: 10 (ref 5–15)
BUN: 22 mg/dL — ABNORMAL HIGH (ref 6–20)
CO2: 27 mmol/L (ref 22–32)
Calcium: 8.9 mg/dL (ref 8.9–10.3)
Chloride: 103 mmol/L (ref 101–111)
Creatinine, Ser: 1.94 mg/dL — ABNORMAL HIGH (ref 0.61–1.24)
GFR calc Af Amer: 35 mL/min — ABNORMAL LOW (ref >60)
GFR calc non Af Amer: 30 mL/min — ABNORMAL LOW (ref >60)
Glucose, Bld: 100 mg/dL — ABNORMAL HIGH (ref 65–99)
Potassium: 4.1 mmol/L (ref 3.5–5.1)
Sodium: 140 mmol/L (ref 135–145)
Total Bilirubin: 1.2 mg/dL (ref 0.3–1.2)
Total Protein: 6.8 g/dL (ref 6.5–8.1)

## 2016-08-12 LAB — CBC WITH DIFFERENTIAL/PLATELET
Basophils Absolute: 0 10*3/uL (ref 0–0.1)
Basophils Relative: 0 %
Eosinophils Absolute: 0.1 10*3/uL (ref 0–0.7)
Eosinophils Relative: 2 %
HCT: 41.5 % (ref 40.0–52.0)
Hemoglobin: 14.3 g/dL (ref 13.0–18.0)
Lymphocytes Relative: 24 %
Lymphs Abs: 1.4 10*3/uL (ref 1.0–3.6)
MCH: 30.9 pg (ref 26.0–34.0)
MCHC: 34.5 g/dL (ref 32.0–36.0)
MCV: 89.4 fL (ref 80.0–100.0)
Monocytes Absolute: 0.5 10*3/uL (ref 0.2–1.0)
Monocytes Relative: 10 %
Neutro Abs: 3.6 10*3/uL (ref 1.4–6.5)
Neutrophils Relative %: 64 %
Platelets: 240 10*3/uL (ref 150–440)
RBC: 4.64 MIL/uL (ref 4.40–5.90)
RDW: 14.4 % (ref 11.5–14.5)
WBC: 5.6 10*3/uL (ref 3.8–10.6)

## 2016-08-12 NOTE — Progress Notes (Signed)
[facility] day:  08/12/2016  Chief Complaint: Walter [name] is a 83 y.o. male with a saddle pulmonary embolism and lower extremity DVT who is seen for 3 month assessment.  HPI:  The patient was last seen in the hematology clinic on 05/12/2016.  At that time, he was taking Eliquis.  He decided to cancel his GI and urology procedures.  He was not concerned about his symptoms.  He was frustrated that he couldn't play golf.  Because of his Parkinson's disease, he had trouble getting dressed.   During the interim, he has felt "the same".  He states that he "babies himself" and does not a lot.  He describes doing 4 weeks of physical therapy and speech therapy.  He denies any bruising or bleeding.   Past Medical History:  Diagnosis Date  . ARF (acute renal failure) ([place]) 2011   after TKR  . BPH (benign prostatic hypertrophy)   . DVT (deep venous thrombosis) ([place]) 1998   after left knee arthroscopy  . Hx of colonoscopy 2000  . Hypertension   . Hypothyroid 2007   postop from thyroidectomy (cancer scare)  . Kidney stones    recurrent  . Oral cancer ([place]) 2004   graft from left thigh  . Osteoarthritis of both knees   . Parkinson's disease ([place])   . Tendonitis 2012   from quinolone  . Toe amputation status ([place]) 2010    Past Surgical History:  Procedure Laterality Date  . APPENDECTOMY    . CHOLECYSTECTOMY    . HERNIA REPAIR    . LITHOTRIPSY  2009/2010   then stent and cystoscopic removal 2014  . Oral cancer removed Right 2004  . REPAIR ZENKER'S DIVERTICULA  2013  . THYROIDECTOMY  2007  . TOE AMPUTATION Right 2010   badly overlapping other toe  . TOTAL KNEE ARTHROPLASTY Left   . TRANSURETHRAL RESECTION OF PROSTATE  2011  . UMBILICAL HERNIA REPAIR  2007    Family History  Problem Relation Age of Onset  . Dementia Mother   . Stroke Father   . Pulmonary disease Brother   . Heart disease Neg Hx   . Diabetes Neg Hx     Social  History:  reports that he quit smoking about 14 years ago. His smoking use included Cigarettes. He has never used smokeless tobacco. He reports that he drinks alcohol. He reports that he does not use drugs.  He previously smoked 2-3 cigars/day for 30-40 years.  He stopped smoking in 2004.  He previously lived in [place].  He lives in [place].  They moved to [facility].  He and his wife met in junior high school.  They have been married 72 years.  The patient is accompanied by his wife, Walter [name], today.  Allergies:  Allergies  Allergen Reactions  . Quinolones     tendonitis    Current Medications: Current Outpatient Prescriptions  Medication Sig Dispense Refill  . amLODipine (NORVASC) 5 MG tablet TAKE 1 TABLET EVERY DAY 90 tablet 3  . carbidopa-levodopa (SINEMET IR) 25-100 MG tablet TAKE 1 TABLET THREE TIMES DAILY 270 tablet 1  . Cholecalciferol (VITAMIN D) 2000 UNITS CAPS Take by mouth daily.    Marland Kitchen ELIQUIS 5 MG TABS tablet TAKE 1 TABLET TWICE DAILY 60 tablet 0  . levothyroxine (SYNTHROID, LEVOTHROID) 112 MCG tablet TAKE 1 TABLET EVERY DAY BEFORE BREAKFAST 90 tablet 2  . polyethylene glycol (MIRALAX / GLYCOLAX) packet Take 17 g  by mouth daily as needed.     . vitamin B-12 (CYANOCOBALAMIN) 1000 MCG tablet Take 1,000 mcg by mouth daily.     No current facility-administered medications for this visit.     Review of Systems:  GENERAL:  Feels "the same".  No fevers or sweats.  Weight loss of 30-40 pounds in 2-3 years.  Weight down 3 pounds since last visit. PERFORMANCE STATUS (ECOG):  1 HEENT:   No visual changes, runny nose, sore throat, mouth sores or tenderness. Lungs: No shortness of breath or cough.  No hemoptysis. Cardiac:  No chest pain, palpitations, orthopnea, or PND. GI:   Takes small bites and chews food well.  No nausea, vomiting, diarrhea, constipation, melena or hematochezia. GU:  No urgency, frequency, dysuria.  Hematuria with a h/o nephrolithiasis. Musculoskeletal:   No back pain.  No joint pain.  No muscle tenderness. Extremities:  No pain or swelling. Skin:  No rashes or skin changes. Neuro:  No headache, numbness or weakness, balance or coordination issues. Endocrine:  No diabetes.  Thyroid disease on Synthroid.  No hot flashes or night sweats. Psych:  No mood changes, depression or anxiety. Pain:  No focal pain. Review of systems:  All other systems reviewed and found to be negative.  Physical Exam: Blood pressure 130/78, pulse 76, temperature 97.8 F (36.6 C), temperature source Tympanic, resp. rate 18, weight 179 lb 5 oz (81.3 kg). GENERAL:  Well developed, well nourished, gentleman sitting comfortably in the exam room in no acute distress. MENTAL STATUS:  Alert and oriented to person, place and time. HEAD:  Pearline Cables hair.  Normocephalic, atraumatic, face symmetric, no Cushingoid features. EYES:  Blue eyes.  Pupils equal round and reactive to light and accomodation.  No conjunctivitis or scleral icterus. ENT:  Oropharynx clear without lesion.  Cheek graft.  Tongue normal. Mucous membranes moist.  RESPIRATORY:  Clear to auscultation without rales, wheezes or rhonchi. CARDIOVASCULAR:  Regular rate and rhythm without murmur, rub or gallop. ABDOMEN:  Soft, non-tender, with active bowel sounds, and no hepatosplenomegaly.  No masses. SKIN:  No rashes, ulcers or lesions. EXTREMITIES: Chronic lower extremity changes (left > right).  No skin discoloration or tenderness.  No palpable cords. LYMPH NODES: No palpable cervical, supraclavicular, axillary or inguinal adenopathy  NEUROLOGICAL: Unremarkable. PSYCH:  Appropriate.   Appointment on 08/12/2016  Component Date Value Ref Range Status  . WBC 08/12/2016 5.6  3.8 - 10.6 K/uL Final  . RBC 08/12/2016 4.64  4.40 - 5.90 MIL/uL Final  . Hemoglobin 08/12/2016 14.3  13.0 - 18.0 g/dL Final  . HCT 08/12/2016 41.5  40.0 - 52.0 % Final  . MCV 08/12/2016 89.4  80.0 - 100.0 fL Final  . MCH 08/12/2016 30.9  26.0 -  34.0 pg Final  . MCHC 08/12/2016 34.5  32.0 - 36.0 g/dL Final  . RDW 08/12/2016 14.4  11.5 - 14.5 % Final  . Platelets 08/12/2016 240  150 - 440 K/uL Final  . Neutrophils Relative % 08/12/2016 64  % Final  . Neutro Abs 08/12/2016 3.6  1.4 - 6.5 K/uL Final  . Lymphocytes Relative 08/12/2016 24  % Final  . Lymphs Abs 08/12/2016 1.4  1.0 - 3.6 K/uL Final  . Monocytes Relative 08/12/2016 10  % Final  . Monocytes Absolute 08/12/2016 0.5  0.2 - 1.0 K/uL Final  . Eosinophils Relative 08/12/2016 2  % Final  . Eosinophils Absolute 08/12/2016 0.1  0 - 0.7 K/uL Final  . Basophils Relative 08/12/2016 0  %  Final  . Basophils Absolute 08/12/2016 0.0  0 - 0.1 K/uL Final  . Sodium 08/12/2016 140  135 - 145 mmol/L Final  . Potassium 08/12/2016 4.1  3.5 - 5.1 mmol/L Final  . Chloride 08/12/2016 103  101 - 111 mmol/L Final  . CO2 08/12/2016 27  22 - 32 mmol/L Final  . Glucose, Bld 08/12/2016 100* 65 - 99 mg/dL Final  . BUN 08/12/2016 22* 6 - 20 mg/dL Final  . Creatinine, Ser 08/12/2016 1.94* 0.61 - 1.24 mg/dL Final  . Calcium 08/12/2016 8.9  8.9 - 10.3 mg/dL Final  . Total Protein 08/12/2016 6.8  6.5 - 8.1 g/dL Final  . Albumin 08/12/2016 4.2  3.5 - 5.0 g/dL Final  . AST 08/12/2016 17  15 - 41 U/L Final  . ALT 08/12/2016 5* 17 - 63 U/L Final  . Alkaline Phosphatase 08/12/2016 69  38 - 126 U/L Final  . Total Bilirubin 08/12/2016 1.2  0.3 - 1.2 mg/dL Final  . GFR calc non Af Amer 08/12/2016 30* >60 mL/min Final  . GFR calc Af Amer 08/12/2016 35* >60 mL/min Final   Comment: (NOTE) The eGFR has been calculated using the CKD EPI equation. This calculation has not been validated in all clinical situations. eGFR's persistently <60 mL/min signify possible Chronic Kidney Disease.   . Anion gap 08/12/2016 10  5 - 15 Final    Assessment:  Walter [name] is a 83 y.o. male with a history of recurrent thrombosis.  He had a left lower extremity DVT in 1990 following arthroscopic surgery.  He was on Coumadin < 1  year.  He developed a pulmonary embolism on 03/03/2016 without apparent precipitating events.  Three weeks prior, he had been on a 2 1/2 hour flight.  He is on Eliquis.  Chest CT angiogram on 03/03/2016 revealed a moderate to large burden of pulmonary emboli in all lobes including a saddle embolus with no evidence of heart strain.  There was a small hiatal hernia with possible associated wall thickening in the distal esophagus.  Bilateral lower extremity duplex on 03/04/2016 revealed an age-indeterminate short-segment occlusive DVT within distal aspect of the right femoral vein.  Hypercoagulable work-up on 03/04/2016 included the following normal studies: CBC, factor V Leiden, prothrombin gene mutation, lupus anticoagulant panel, anti-cardiolipin antibodies, beta-2 glycoprotein, protein S total (115%), protein S activity (110%), protein C activity (81%), AT III activity (84%).  Protein C total was 59% (60-150%).  Homocysteine level was 20.5 (0-15).  Creatinine was 1.63 (CrCl 43 ml/min).  Bilirubin was 1.5 (repeat 0.8 on 03/21/2016).  He is due for a colonoscopy.  He has a history of Zenker's diverticulum.   He has a history of nephrolithiasis.  CT renal stone study on 03/21/2016 revealed numerous small bilateral nonobstructing renal stones without hydronephrosis.  There was a large 7.6 cm right renal cyst which could not be fully characterized without intravenous contrast.  CT urogram and cystoscopy are planned.  He has a history of oral cancer s/p resection of his inner cheek at [facility] in 2004.   Lab on 03/21/2016 included a normal CEA (4.2), CA19.9 (22), and PSA (3.56).  Symptomatically, he feels "the same".  He has a 2-3 year history of a 30-40 pound weight loss (stable in the past year).  He chokes easily.  He has a history of nephrolithiasis and hematuria.  Plan: 1.  Labs today:  CBC with diff, CMP. 2.  Discuss thoughts about procedures.  Patient does not want to  proceed with  endoscopy secondary to his age.  He wishes to defer unless he is concerned about his symptoms based on his age. 3.  Continue Eliquis.   4.  Send CMP to Dr. Silvio Pate.  Call,  Cr higher.  Needs close monitoring 5.  RTC in 6 months for MD assessment and labs (CBC with diff, CMP).   Lequita Asal, MD  08/12/2016, 2:46 PM

## 2016-08-12 NOTE — Progress Notes (Signed)
Patient has a couple of areas on his right thigh that he would like MD to look at.  Not sure if it has to do with his taking Eliquis or if he got into something while watering his garden.

## 2016-08-15 NOTE — Progress Notes (Signed)
Walter [name] was seen today in the movement disorders clinic for neurologic consultation at the request of Venia Carbon, MD.  The consultation is for the evaluation of Parkinson's disease.  The patient reports he has never seen a neurologist previously.  This patient is accompanied in the office by his spouse who supplements the history.  I have had the opportunity to review records from his primary care physician regarding the diagnosis.  The patient was diagnosed with Parkinson's disease by his primary care physician in June, 2016 and placed on levodopa.  The patient indicates that after starting the medication, he felt that his symptoms were much better.  His balance was better.  Reports that [place] helped his balance as well.  Pt reports that his first sx was slowness, shuffling and balance trouble.    04/01/16 update:  Pt f/u today, accompanied by his wife who supplements the hx.  On carbidopa/levodopa 25/100 tid.  Pt denies falls.  Pt denies lightheadedness, near syncope.  No hallucinations.  Mood has been good.  The records that were made available to me were reviewed.  He had a MBE on 12/11/15 and there was moderate oropharyngeal dysphagia.  There was laryngeal penetration of nectar thick liquid.  Small zenkers diverticulum noted.  Regular diet recommended.  Asks about going to ST and that is his biggest c/o.  Speech getting quiet.  Used to enjoy singing and now can't and would like to get back to that.  MRI brain done and reviewed on 12/03/15.  Demonstrated only mod atrophy and mod small vessel disease.  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  He was d/c on 03/05/16 on eliquis.  Back on the upswing but not been able to exercise yet.    08/18/16 update:  Patient seen today in follow-up.  He is accompanied by his wife who supplements the history.  Patient remains on carbidopa/levodopa 25/100, one tablet 3 times per day.  He did complete LSVT big and loud since  our last visit.  He states that no falls.  He is not exercising.  States that is because his wife is getting ready to have back surgery and decided not to enter a program until she has gotten well.  He is still taking his B12 liquid supplement.  PREVIOUS MEDICATIONS: Sinemet  ALLERGIES:   Allergies  Allergen Reactions  . Quinolones     tendonitis    CURRENT MEDICATIONS:  Outpatient Encounter Prescriptions as of 08/18/2016  Medication Sig  . amLODipine (NORVASC) 5 MG tablet TAKE 1 TABLET EVERY DAY  . carbidopa-levodopa (SINEMET IR) 25-100 MG tablet TAKE 1 TABLET THREE TIMES DAILY  . Cholecalciferol (VITAMIN D) 2000 UNITS CAPS Take by mouth daily.  Marland Kitchen ELIQUIS 5 MG TABS tablet TAKE 1 TABLET TWICE DAILY  . levothyroxine (SYNTHROID, LEVOTHROID) 112 MCG tablet TAKE 1 TABLET EVERY DAY BEFORE BREAKFAST  . polyethylene glycol (MIRALAX / GLYCOLAX) packet Take 17 g by mouth daily as needed.   . vitamin B-12 (CYANOCOBALAMIN) 1000 MCG tablet Take 1,000 mcg by mouth daily.   No facility-administered encounter medications on file as of 08/18/2016.     PAST MEDICAL HISTORY:   Past Medical History:  Diagnosis Date  . ARF (acute renal failure) ([place]) 2011   after TKR  . BPH (benign prostatic hypertrophy)   . DVT (deep venous thrombosis) ([place]) 1998   after left knee arthroscopy  . Hx of colonoscopy 2000  . Hypertension   .  Hypothyroid 2007   postop from thyroidectomy (cancer scare)  . Kidney stones    recurrent  . Oral cancer ([place]) 2004   graft from left thigh  . Osteoarthritis of both knees   . Parkinson's disease ([place])   . Tendonitis 2012   from quinolone  . Toe amputation status ([place]) 2010    PAST SURGICAL HISTORY:   Past Surgical History:  Procedure Laterality Date  . APPENDECTOMY    . CHOLECYSTECTOMY    . HERNIA REPAIR    . LITHOTRIPSY  2009/2010   then stent and cystoscopic removal 2014  . Oral cancer removed Right 2004  . REPAIR ZENKER'S DIVERTICULA  2013  . THYROIDECTOMY   2007  . TOE AMPUTATION Right 2010   badly overlapping other toe  . TOTAL KNEE ARTHROPLASTY Left   . TRANSURETHRAL RESECTION OF PROSTATE  2011  . UMBILICAL HERNIA REPAIR  2007    SOCIAL HISTORY:   Social History   Social History  . Marital status: Married    Spouse name: N/A  . Number of children: 3  . Years of education: N/A   Occupational History  . 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 Smoking status: Former Smoker    Types: Cigarettes    Quit date: 04/27/2002  . Smokeless tobacco: Never Used  . Alcohol use 0.0 oz/week     Comment: one drink daily (wine or scotch)  . Drug use: No  . Sexual activity: Not on file   Other Topics Concern  . Not on file   Social History Narrative   Son works for [employer]   1 daughter in [place], other in [place] living will   Wife, then son, would be health care POA   He isn't sure about DNR--will accept resuscitation for now   No tube feeds if cognitively unaware    FAMILY HISTORY:   Family Status  Relation Status  . Mother Deceased at age 81       old age  . Father Deceased at age 13       stroke  . Brother Deceased       unknown-- had moved away  . Child Alive  . Neg Hx (Not Specified)    ROS:  A complete 10 system review of systems was obtained and was unremarkable apart from what is mentioned above.  PHYSICAL EXAMINATION:    VITALS:   Vitals:   08/18/16 0932  BP: 136/84  Pulse: 82  SpO2: 95%  Weight: 177 lb (80.3 kg)  Height: '5\' 10"'$  (1.778 m)    GEN:  The patient appears stated age and is in NAD. HEENT:  Normocephalic, atraumatic.  The mucous membranes are moist. The superficial temporal arteries are without ropiness or tenderness. CV:  RRR Lungs:  CTAB Neck/HEME:  There are no carotid bruits bilaterally.  Neurological examination:  Orientation: The patient is alert and oriented x3. Fund of knowledge is appropriate.  Recent  and remote memory are intact.  Attention and concentration are normal.    Able to name objects and repeat phrases. Cranial nerves: There is good facial symmetry with facial hypomimia.  The visual fields are full to confrontational testing. The speech is fluent and clear. Soft palate rises symmetrically and there is no tongue deviation. Hearing is intact to conversational tone. Sensation: Sensation is intact to light and pinprick throughout (facial, trunk, extremities). Vibration is  decreased at the bilateral big toe and ankle. There is no extinction with double simultaneous stimulation. There is no sensory dermatomal level identified. Motor: Strength is 5/5 in the bilateral upper and lower extremities.   Shoulder shrug is equal and symmetric.  There is no pronator drift.   Movement examination: Tone: There is normal tone in the UE Abnormal movements: There is no tremor today Coordination:  There is no decremation, with any form of RAMS, including alternating supination and pronation of the forearm, hand opening and closing, finger taps, heel taps and toe taps. Gait and Station: The patient has no difficulty arising out of a deep-seated chair without the use of the hands and is able to do it on first attempt. The patient's stride length is decreased with decreased arm swing bilaterally.  The patient turns en bloc.  He shuffles some  Lab Results  Component Value Date   [id number] 355 (H) 08/25/2014     ASSESSMENT/PLAN:  1.  Parkinsonism.  I suspect that this does represent idiopathic Parkinson's disease but vascular parkinsonism is in the Ddx.  The patient has bradykinesia, rigidity and mild postural instability.  -We discussed the diagnosis as well as pathophysiology of the disease.  We discussed treatment options as well as prognostic indicators.  Patient education was provided.  -Continue carbidopa/levodopa 25/100 tid but move dosages closer together so that he takes them 30 min prior to  meals.  Risks, benefits, side effects and alternative therapies were discussed.  The opportunity to ask questions was given and they were answered to the best of my ability.  The patient expressed understanding and willingness to follow the outlined treatment protocols.  2.  Dysphagia  -he has a zenkers diverticulum.  He had a MBE on 12/11/15 and there was moderate oropharyngeal dysphagia.  There was laryngeal penetration of nectar thick liquid.  Small zenkers diverticulum noted.  Regular diet recommended.    3.  B12 deficiency  -continue this faithfully. His B12 was 192 was prior to starting supplement.  4.  Follow up is anticipated in the next 4-5 months, sooner should new neurologic issues arise.  Much greater than 50% of this visit was spent in counseling and coordinating care.  Total face to face time:  30 min   Cc:  Venia Carbon, MD

## 2016-08-18 ENCOUNTER — Encounter: Payer: Self-pay | Admitting: Neurology

## 2016-08-18 ENCOUNTER — Ambulatory Visit (INDEPENDENT_AMBULATORY_CARE_PROVIDER_SITE_OTHER): Payer: Medicare HMO | Admitting: Neurology

## 2016-08-18 VITALS — BP 136/84 | HR 82 | Ht 70.0 in | Wt 177.0 lb

## 2016-08-18 DIAGNOSIS — R1319 Other dysphagia: Secondary | ICD-10-CM | POA: Diagnosis not present

## 2016-08-18 DIAGNOSIS — G20 Parkinson's disease: Secondary | ICD-10-CM

## 2016-08-18 DIAGNOSIS — E538 Deficiency of other specified B group vitamins: Secondary | ICD-10-CM | POA: Diagnosis not present

## 2016-10-10 ENCOUNTER — Other Ambulatory Visit: Payer: Self-pay | Admitting: Internal Medicine

## 2016-10-25 ENCOUNTER — Encounter: Payer: Medicare HMO | Admitting: Internal Medicine

## 2016-10-26 DIAGNOSIS — M955 Acquired deformity of pelvis: Secondary | ICD-10-CM | POA: Diagnosis not present

## 2016-10-26 DIAGNOSIS — M5416 Radiculopathy, lumbar region: Secondary | ICD-10-CM | POA: Diagnosis not present

## 2016-10-26 DIAGNOSIS — M9905 Segmental and somatic dysfunction of pelvic region: Secondary | ICD-10-CM | POA: Diagnosis not present

## 2016-10-26 DIAGNOSIS — M9903 Segmental and somatic dysfunction of lumbar region: Secondary | ICD-10-CM | POA: Diagnosis not present

## 2016-10-27 ENCOUNTER — Encounter: Payer: Self-pay | Admitting: Hematology and Oncology

## 2016-10-28 ENCOUNTER — Inpatient Hospital Stay: Payer: Medicare HMO | Attending: Hematology and Oncology

## 2016-10-28 ENCOUNTER — Other Ambulatory Visit: Payer: Self-pay

## 2016-10-28 ENCOUNTER — Other Ambulatory Visit: Payer: Self-pay | Admitting: *Deleted

## 2016-10-28 DIAGNOSIS — N183 Chronic kidney disease, stage 3 unspecified: Secondary | ICD-10-CM

## 2016-10-28 DIAGNOSIS — Z86711 Personal history of pulmonary embolism: Secondary | ICD-10-CM | POA: Insufficient documentation

## 2016-10-28 DIAGNOSIS — I82411 Acute embolism and thrombosis of right femoral vein: Secondary | ICD-10-CM

## 2016-10-28 LAB — CBC WITH DIFFERENTIAL/PLATELET
Basophils Absolute: 0 10*3/uL (ref 0–0.1)
Basophils Relative: 0 %
Eosinophils Absolute: 0.2 10*3/uL (ref 0–0.7)
Eosinophils Relative: 3 %
HCT: 41.9 % (ref 40.0–52.0)
Hemoglobin: 14.5 g/dL (ref 13.0–18.0)
Lymphocytes Relative: 16 %
Lymphs Abs: 1 10*3/uL (ref 1.0–3.6)
MCH: 31 pg (ref 26.0–34.0)
MCHC: 34.6 g/dL (ref 32.0–36.0)
MCV: 89.5 fL (ref 80.0–100.0)
Monocytes Absolute: 0.7 10*3/uL (ref 0.2–1.0)
Monocytes Relative: 10 %
Neutro Abs: 4.4 10*3/uL (ref 1.4–6.5)
Neutrophils Relative %: 71 %
Platelets: 241 10*3/uL (ref 150–440)
RBC: 4.68 MIL/uL (ref 4.40–5.90)
RDW: 13.8 % (ref 11.5–14.5)
WBC: 6.3 10*3/uL (ref 3.8–10.6)

## 2016-10-28 LAB — BASIC METABOLIC PANEL
Anion gap: 9 (ref 5–15)
BUN: 26 mg/dL — ABNORMAL HIGH (ref 6–20)
CO2: 31 mmol/L (ref 22–32)
Calcium: 9.4 mg/dL (ref 8.9–10.3)
Chloride: 101 mmol/L (ref 101–111)
Creatinine, Ser: 1.76 mg/dL — ABNORMAL HIGH (ref 0.61–1.24)
GFR calc Af Amer: 39 mL/min — ABNORMAL LOW (ref >60)
GFR calc non Af Amer: 34 mL/min — ABNORMAL LOW (ref >60)
Glucose, Bld: 99 mg/dL (ref 65–99)
Potassium: 4.4 mmol/L (ref 3.5–5.1)
Sodium: 141 mmol/L (ref 135–145)

## 2016-11-01 ENCOUNTER — Encounter: Payer: Self-pay | Admitting: Internal Medicine

## 2016-11-01 ENCOUNTER — Ambulatory Visit (INDEPENDENT_AMBULATORY_CARE_PROVIDER_SITE_OTHER): Payer: Medicare HMO | Admitting: Internal Medicine

## 2016-11-01 VITALS — BP 122/76 | HR 76 | Temp 98.0°F | Ht 69.0 in | Wt 176.0 lb

## 2016-11-01 DIAGNOSIS — Z0000 Encounter for general adult medical examination without abnormal findings: Secondary | ICD-10-CM

## 2016-11-01 DIAGNOSIS — Z23 Encounter for immunization: Secondary | ICD-10-CM | POA: Diagnosis not present

## 2016-11-01 DIAGNOSIS — N183 Chronic kidney disease, stage 3 unspecified: Secondary | ICD-10-CM

## 2016-11-01 DIAGNOSIS — I2692 Saddle embolus of pulmonary artery without acute cor pulmonale: Secondary | ICD-10-CM | POA: Diagnosis not present

## 2016-11-01 DIAGNOSIS — Z7189 Other specified counseling: Secondary | ICD-10-CM

## 2016-11-01 DIAGNOSIS — R350 Frequency of micturition: Secondary | ICD-10-CM | POA: Diagnosis not present

## 2016-11-01 DIAGNOSIS — I10 Essential (primary) hypertension: Secondary | ICD-10-CM | POA: Diagnosis not present

## 2016-11-01 DIAGNOSIS — N401 Enlarged prostate with lower urinary tract symptoms: Secondary | ICD-10-CM | POA: Diagnosis not present

## 2016-11-01 DIAGNOSIS — S98131A Complete traumatic amputation of one right lesser toe, initial encounter: Secondary | ICD-10-CM

## 2016-11-01 DIAGNOSIS — M9903 Segmental and somatic dysfunction of lumbar region: Secondary | ICD-10-CM | POA: Diagnosis not present

## 2016-11-01 DIAGNOSIS — M5416 Radiculopathy, lumbar region: Secondary | ICD-10-CM | POA: Diagnosis not present

## 2016-11-01 DIAGNOSIS — M955 Acquired deformity of pelvis: Secondary | ICD-10-CM | POA: Diagnosis not present

## 2016-11-01 DIAGNOSIS — Z89421 Acquired absence of other right toe(s): Secondary | ICD-10-CM

## 2016-11-01 DIAGNOSIS — M9905 Segmental and somatic dysfunction of pelvic region: Secondary | ICD-10-CM | POA: Diagnosis not present

## 2016-11-01 MED ORDER — TAMSULOSIN HCL 0.4 MG PO CAPS: 0.4000 mg | ORAL_CAPSULE | Freq: Every day | ORAL | 3 refills | Status: DC

## 2016-11-01 NOTE — Assessment & Plan Note (Signed)
Fairly stable though up a bit Not clear if ACEI a good idea now---will send to nephrology if creatinine goes up again

## 2016-11-01 NOTE — Progress Notes (Signed)
Subjective:    Patient ID: Walter [name], male    DOB: 1931-08-08, 83 y.o.   MRN: [id number]  HPI Here for Medicare wellness and follow up of chronic health conditions Reviewed form and advanced directives Reviewed other doctors---see list No alcohol or tobacco Inconsistent with his exercise Vision is okay Poor hearing --- wears them only at times No depression or anhedonia Mild memory issues he thinks are normal for age Independent with instrumental ADLs  Fell while gardening--tripped over rake Multiple ecchymoses on arms Nurses at [facility] have done wound care and improving Going to Dr Francisca December for chiropractic--- worsened his back pain This is the only fall this year  Continues with Dr Tat for his Parkinson's Functionally doing okay Remains independent with ADLs and helps with instrumental ADLs Voice still weak--but some better  Having more nocturia recently Up 4-5 times per night Not much trouble during the day--has to go every 2-3 hours Stream is reasonable Interested in trying medication  Has known CKD Creatinine up in June--but better when rechecked last week  Has spot on nose Got cryotherapy here but has recurred (over a year ago) Discussed that he should see a dermatologist (doesn't have one--will plan for Dr Nehemiah Massed)  Discoloration and flaking in left calf No significant swelling like with DVT No chest pain or SOB  No problems with right 2nd toe amputation site  Current Outpatient Prescriptions on File Prior to Visit  Medication Sig Dispense Refill  . amLODipine (NORVASC) 5 MG tablet TAKE 1 TABLET EVERY DAY 90 tablet 0  . carbidopa-levodopa (SINEMET IR) 25-100 MG tablet TAKE 1 TABLET THREE TIMES DAILY 270 tablet 0  . Cholecalciferol (VITAMIN D) 2000 UNITS CAPS Take by mouth daily.    Marland Kitchen ELIQUIS 5 MG TABS tablet TAKE 1 TABLET TWICE DAILY 60 tablet 0  . levothyroxine (SYNTHROID, LEVOTHROID) 112 MCG tablet TAKE 1 TABLET EVERY DAY BEFORE BREAKFAST 90 tablet 0    . polyethylene glycol (MIRALAX / GLYCOLAX) packet Take 17 g by mouth daily as needed.     . vitamin B-12 (CYANOCOBALAMIN) 1000 MCG tablet Take 1,000 mcg by mouth daily.     No current facility-administered medications on file prior to visit.     Allergies  Allergen Reactions  . Quinolones     tendonitis    Past Medical History:  Diagnosis Date  . ARF (acute renal failure) ([place]) 2011   after TKR  . BPH (benign prostatic hypertrophy)   . DVT (deep venous thrombosis) ([place]) 1998   after left knee arthroscopy  . Hx of colonoscopy 2000  . Hypertension   . Hypothyroid 2007   postop from thyroidectomy (cancer scare)  . Kidney stones    recurrent  . Oral cancer ([place]) 2004   graft from left thigh  . Osteoarthritis of both knees   . Parkinson's disease ([place])   . Tendonitis 2012   from quinolone  . Toe amputation status ([place]) 2010    Past Surgical History:  Procedure Laterality Date  . APPENDECTOMY    . CHOLECYSTECTOMY    . HERNIA REPAIR    . LITHOTRIPSY  2009/2010   then stent and cystoscopic removal 2014  . Oral cancer removed Right 2004  . REPAIR ZENKER'S DIVERTICULA  2013  . THYROIDECTOMY  2007  . TOE AMPUTATION Right 2010   badly overlapping other toe  . TOTAL KNEE ARTHROPLASTY Left   . TRANSURETHRAL RESECTION OF PROSTATE  2011  . UMBILICAL HERNIA REPAIR  2007  Family History  Problem Relation Age of Onset  . Dementia Mother   . Stroke Father   . Pulmonary disease Brother   . Heart disease Neg Hx   . Diabetes Neg Hx     Social History   Social History  . Marital status: Married    Spouse name: N/A  . Number of children: 3  . Years of education: N/A   Occupational History  . 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 Smoking status: Former Smoker    Types: Cigarettes    Quit date: 04/27/2002  . Smokeless tobacco: Never Used  . Alcohol use 0.0 oz/week     Comment: one drink  daily (wine or scotch)  . Drug use: No  . Sexual activity: Not on file   Other Topics Concern  . Not on file   Social History Narrative   Son works for [employer]   1 daughter in [place], other in [place] living will   Wife, then son, would be health care POA   He isn't sure about DNR--will accept resuscitation for now   No tube feeds if cognitively unaware   Review of Systems Appetite is good Weight stable or slightly down--working healthy eating (needs soft food with Zenker's and Parkinson's) Sleeps fine--other than the nocturia Wears seat belt Bowels are fine. No blood No other skin lesions other than nose Easy bruising and bleeding Teeth okay--- keeps up with dentist    Objective:   Physical Exam  Constitutional: He is oriented to person, place, and time. He appears well-developed. No distress.  HENT:  Mouth/Throat: Oropharynx is clear and moist. No oropharyngeal exudate.  Neck: No thyromegaly present.  Cardiovascular: Normal rate, regular rhythm and normal heart sounds.  Exam reveals no gallop.   No murmur heard. Feet warm with no palpable pulses  Pulmonary/Chest: Effort normal and breath sounds normal. No respiratory distress. He has no wheezes. He has no rales.  Abdominal: Soft. There is no tenderness.  Musculoskeletal:  Trace to 1+ ankle and calf edema Right 2nd toe amputations site clean and dry  Lymphadenopathy:    He has no cervical adenopathy.  Neurological: He is alert and oriented to person, place, and time.  President --- "Dwaine Deter, [place]" [phone] [id number] Recall 3/3  Skin:  Stasis changes in calves ?actinic on nose (he will go to derm)  Psychiatric: He has a normal mood and affect. His behavior is normal.          Assessment & Plan:

## 2016-11-01 NOTE — Assessment & Plan Note (Signed)
Increased symptoms  Will try tamsulosin

## 2016-11-01 NOTE — Assessment & Plan Note (Signed)
See social history 

## 2016-11-01 NOTE — Assessment & Plan Note (Signed)
I have personally reviewed the Medicare Annual Wellness questionnaire and have noted 1. The patient's medical and social history 2. Their use of alcohol, tobacco or illicit drugs 3. Their current medications and supplements 4. The patient's functional ability including ADL's, fall risks, home safety risks and hearing or visual             impairment. 5. Diet and physical activities 6. Evidence for depression or mood disorders  The patients weight, height, BMI and visual acuity have been recorded in the chart I have made referrals, counseling and provided education to the patient based review of the above and I have provided the pt with a written personalized care plan for preventive services.  I have provided you with a copy of your personalized plan for preventive services. Please take the time to review along with your updated medication list.  No cancer screening due to age Update pneumovax today Discussed trying some weight training at gym at [facility]

## 2016-11-01 NOTE — Assessment & Plan Note (Signed)
Sees Dr Mike Gip Will continue the eliquis

## 2016-11-01 NOTE — Assessment & Plan Note (Signed)
Right foot No problems with this

## 2016-11-01 NOTE — Assessment & Plan Note (Signed)
BP Readings from Last 3 Encounters:  11/01/16 122/76  08/18/16 136/84  08/12/16 130/78   Good control

## 2016-11-08 ENCOUNTER — Telehealth: Payer: Self-pay

## 2016-11-08 NOTE — Telephone Encounter (Signed)
Pt left v/m [employer] contacted pt that needs more info to fill the rx for tamsulosin and has been trying to contact [place]. Pt request [facility] to contact [employer]. Pt last seen 11/01/16. Has [employer] faxed anything to [facility]?

## 2016-11-09 MED ORDER — TAMSULOSIN HCL 0.4 MG PO CAPS: 0.4000 mg | ORAL_CAPSULE | Freq: Every day | ORAL | 3 refills | Status: DC

## 2016-11-09 NOTE — Telephone Encounter (Signed)
We received a fax the other day and the rx was corrected to say 1 a day. I can send it, again.

## 2016-11-09 NOTE — Telephone Encounter (Signed)
Spoke to pt. Advised him that I had already taken care of the problem. Told him to let me know if he continues to have an issue

## 2016-11-09 NOTE — Telephone Encounter (Signed)
Pt left v/m requesting cb about contacting [employer] about his medication.

## 2016-11-23 DIAGNOSIS — M9903 Segmental and somatic dysfunction of lumbar region: Secondary | ICD-10-CM | POA: Diagnosis not present

## 2016-11-23 DIAGNOSIS — M955 Acquired deformity of pelvis: Secondary | ICD-10-CM | POA: Diagnosis not present

## 2016-11-23 DIAGNOSIS — M9905 Segmental and somatic dysfunction of pelvic region: Secondary | ICD-10-CM | POA: Diagnosis not present

## 2016-11-23 DIAGNOSIS — M5416 Radiculopathy, lumbar region: Secondary | ICD-10-CM | POA: Diagnosis not present

## 2016-11-29 DIAGNOSIS — M5416 Radiculopathy, lumbar region: Secondary | ICD-10-CM | POA: Diagnosis not present

## 2016-11-29 DIAGNOSIS — M9903 Segmental and somatic dysfunction of lumbar region: Secondary | ICD-10-CM | POA: Diagnosis not present

## 2016-11-29 DIAGNOSIS — M955 Acquired deformity of pelvis: Secondary | ICD-10-CM | POA: Diagnosis not present

## 2016-11-29 DIAGNOSIS — M9905 Segmental and somatic dysfunction of pelvic region: Secondary | ICD-10-CM | POA: Diagnosis not present

## 2016-12-12 ENCOUNTER — Other Ambulatory Visit: Payer: Self-pay | Admitting: Internal Medicine

## 2016-12-14 DIAGNOSIS — M9903 Segmental and somatic dysfunction of lumbar region: Secondary | ICD-10-CM | POA: Diagnosis not present

## 2016-12-14 DIAGNOSIS — M9905 Segmental and somatic dysfunction of pelvic region: Secondary | ICD-10-CM | POA: Diagnosis not present

## 2016-12-14 DIAGNOSIS — M5416 Radiculopathy, lumbar region: Secondary | ICD-10-CM | POA: Diagnosis not present

## 2016-12-14 DIAGNOSIS — M955 Acquired deformity of pelvis: Secondary | ICD-10-CM | POA: Diagnosis not present

## 2016-12-19 NOTE — Progress Notes (Signed)
Walter [name] was seen today in the movement disorders clinic for neurologic consultation at the request of Venia Carbon, MD.  The consultation is for the evaluation of Parkinson's disease.  The patient reports he has never seen a neurologist previously.  This patient is accompanied in the office by his spouse who supplements the history.  I have had the opportunity to review records from his primary care physician regarding the diagnosis.  The patient was diagnosed with Parkinson's disease by his primary care physician in June, 2016 and placed on levodopa.  The patient indicates that after starting the medication, he felt that his symptoms were much better.  His balance was better.  Reports that [place] helped his balance as well.  Pt reports that his first sx was slowness, shuffling and balance trouble.    04/01/16 update:  Pt f/u today, accompanied by his wife who supplements the hx.  On carbidopa/levodopa 25/100 tid.  Pt denies falls.  Pt denies lightheadedness, near syncope.  No hallucinations.  Mood has been good.  The records that were made available to me were reviewed.  He had a MBE on 12/11/15 and there was moderate oropharyngeal dysphagia.  There was laryngeal penetration of nectar thick liquid.  Small zenkers diverticulum noted.  Regular diet recommended.  Asks about going to ST and that is his biggest c/o.  Speech getting quiet.  Used to enjoy singing and now can't and would like to get back to that.  MRI brain done and reviewed on 12/03/15.  Demonstrated only mod atrophy and mod small vessel disease.  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  He was d/c on 03/05/16 on eliquis.  Back on the upswing but not been able to exercise yet.    08/18/16 update:  Patient seen today in follow-up.  He is accompanied by his wife who supplements the history.  Patient remains on carbidopa/levodopa 25/100, one tablet 3 times per day.  He did complete LSVT big and loud since  our last visit.  He states that no falls.  He is not exercising.  States that is because his wife is getting ready to have back surgery and decided not to enter a program until she has gotten well.  He is still taking his B12 liquid supplement.  12/20/16 update: Patient is seen today in follow-up for his parkinsonism.  He is accompanied by his wife who supplements the history.  The patient is supposed to be on carbidopa/levodopa 25/100, 1 tablet 3 times per day but admits he often misses the middle of the day dosage.  Wife notes more shuffling.  Records have been reviewed since our last visit.  He has had his yearly physical since last visit.  He has had some falls.  States that he was feeling "pretty good" and he was gardening and he tripped himself on roots and he had to have some help by a neighbor getting off the ground.  He has also fallen out of bed 2 times with active dreams.  He actually put a hold in the wall with his shoulder/elbow when falling out of the bed. No lightheadedness or near syncope.  No hallucinations.  No visual distortions.  PREVIOUS MEDICATIONS: Sinemet  ALLERGIES:   Allergies  Allergen Reactions  . Quinolones     tendonitis    CURRENT MEDICATIONS:  Outpatient Encounter Prescriptions as of 12/20/2016  Medication Sig  . amLODipine (NORVASC) 5 MG tablet TAKE 1  TABLET EVERY DAY  . carbidopa-levodopa (SINEMET IR) 25-100 MG tablet TAKE 1 TABLET THREE TIMES DAILY  . Cholecalciferol (VITAMIN D) 2000 UNITS CAPS Take by mouth daily.  Marland Kitchen ELIQUIS 5 MG TABS tablet TAKE 1 TABLET TWICE DAILY  . levothyroxine (SYNTHROID, LEVOTHROID) 112 MCG tablet TAKE 1 TABLET EVERY DAY BEFORE BREAKFAST  . polyethylene glycol (MIRALAX / GLYCOLAX) packet Take 17 g by mouth daily as needed.   . vitamin B-12 (CYANOCOBALAMIN) 1000 MCG tablet Take 1,000 mcg by mouth daily.  . clonazePAM (KLONOPIN) 0.5 MG tablet Take 0.5 tablets (0.25 mg total) by mouth at bedtime.  . [DISCONTINUED] tamsulosin (FLOMAX)  0.4 MG CAPS capsule Take 1 capsule (0.4 mg total) by mouth daily.   No facility-administered encounter medications on file as of 12/20/2016.     PAST MEDICAL HISTORY:   Past Medical History:  Diagnosis Date  . ARF (acute renal failure) ([place]) 2011   after TKR  . BPH (benign prostatic hypertrophy)   . DVT (deep venous thrombosis) ([place]) 1998   after left knee arthroscopy  . Hx of colonoscopy 2000  . Hypertension   . Hypothyroid 2007   postop from thyroidectomy (cancer scare)  . Kidney stones    recurrent  . Oral cancer ([place]) 2004   graft from left thigh  . Osteoarthritis of both knees   . Parkinson's disease ([place])   . Tendonitis 2012   from quinolone  . Toe amputation status ([place]) 2010    PAST SURGICAL HISTORY:   Past Surgical History:  Procedure Laterality Date  . APPENDECTOMY    . CHOLECYSTECTOMY    . HERNIA REPAIR    . LITHOTRIPSY  2009/2010   then stent and cystoscopic removal 2014  . Oral cancer removed Right 2004  . REPAIR ZENKER'S DIVERTICULA  2013  . THYROIDECTOMY  2007  . TOE AMPUTATION Right 2010   badly overlapping other toe  . TOTAL KNEE ARTHROPLASTY Left   . TRANSURETHRAL RESECTION OF PROSTATE  2011  . UMBILICAL HERNIA REPAIR  2007    SOCIAL HISTORY:   Social History   Social History  . Marital status: Married    Spouse name: N/A  . Number of children: 3  . Years of education: N/A   Occupational History  . 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 Smoking status: Former Smoker    Types: Cigarettes    Quit date: 04/27/2002  . Smokeless tobacco: Never Used  . Alcohol use 0.0 oz/week     Comment: one drink daily (wine or scotch)  . Drug use: No  . Sexual activity: Not on file   Other Topics Concern  . Not on file   Social History Narrative   Son works for [employer]   1 daughter in [place], other in [place] living will   Wife, then son, would be health care POA    He isn't sure about DNR--will accept resuscitation for now   No tube feeds if cognitively unaware    FAMILY HISTORY:   Family Status  Relation Status  . Mother Deceased at age 26       old age  . Father Deceased at age 64       stroke  . Brother Deceased       unknown-- had moved away  . Child Alive  . Neg Hx (Not Specified)    ROS:  A complete  10 system review of systems was obtained and was unremarkable apart from what is mentioned above.  PHYSICAL EXAMINATION:    VITALS:   Vitals:   12/20/16 1104  BP: 104/68  Pulse: 76  SpO2: 96%  Weight: 177 lb (80.3 kg)  Height: 5\' 10"  (1.778 m)    GEN:  The patient appears stated age and is in NAD. HEENT:  Normocephalic, atraumatic.  The mucous membranes are moist. The superficial temporal arteries are without ropiness or tenderness. CV:  RRR Lungs:  CTAB Neck/HEME:  There are no carotid bruits bilaterally.  Neurological examination:  Orientation: The patient is alert and oriented x3. Cranial nerves: There is good facial symmetry with facial hypomimia.  The visual fields are full to confrontational testing. The speech is fluent and clear.  He is hypophonic.  Soft palate rises symmetrically and there is no tongue deviation. Hearing is intact to conversational tone. Sensation: Sensation is intact to light touch throughout. Motor: Strength is 5/5 in the bilateral upper and lower extremities.   Shoulder shrug is equal and symmetric.  There is no pronator drift.   Movement examination: Tone: There is mild tone increased in the upper extremities bilaterally. Abnormal movements: There is no tremor today Coordination:  There is no decremation, with any form of RAMS, including alternating supination and pronation of the forearm, hand opening and closing, finger taps, heel taps and toe taps. Gait and Station: The patient has no difficulty arising out of a deep-seated chair without the use of the hands and is able to do it on first  attempt. The patient's stride length is decreased with decreased arm swing bilaterally.  The patient turns en bloc.  He shuffles some (same as previous)  Lab Results  Component Value Date   VITAMINB12 943 (H) 08/25/2014     ASSESSMENT/PLAN:  1.  Idiopathic Parkinson's disease  -We discussed the diagnosis as well as pathophysiology of the disease.  We discussed treatment options as well as prognostic indicators.  Patient education was provided.  -needs to be more consistent with tid dosing with carbidopa/levodopa 25/100.  He is missing middle of day dosing and more shuffling.  Told him he needs to get a watch alarm  -needs PT.  He is resistant and talks about going to tai chi but hasn't done that for 2 years.  He asked me about boxing at [facility] and he was agreeable to PT at [facility] and then will do boxing at [facility].   A RX was provided today   2.  Dysphagia  -he has a zenkers diverticulum.  He had a MBE on 12/11/15 and there was moderate oropharyngeal dysphagia.  There was laryngeal penetration of nectar thick liquid.  Small zenkers diverticulum noted.  Regular diet recommended.   3.  B12 deficiency  -continue this faithfully. His B12 was 192 was prior to starting supplement.  4.  REM behavior disorder  -This is commonly associated with PD and the patient is experiencing this.  We discussed that this can be very serious and even harmful.  He fell out of the bed and put a hole in the wall.   We talked about medications as well as physical barriers to put in the bed (particularly soft bed rails, pillow barriers).  We talked about moving the night stand so that it is not so close to the side of the bed.  -Given the seriousness of his symptoms, we decided to add clonazepam 0.5 mg, half tablet at night.  Risks, benefits, side effects and alternative therapies were discussed.  The opportunity to ask questions was given and they were answered to the best of my ability.  The patient expressed  understanding and willingness to follow the outlined treatment protocols.  5.  Follow up is anticipated in the next few months, sooner should new neurologic issues arise.  Much greater than 50% of this visit was spent in counseling and coordinating care.  Total face to face time:  30 min   Cc:  Venia Carbon, MD

## 2016-12-20 ENCOUNTER — Ambulatory Visit (INDEPENDENT_AMBULATORY_CARE_PROVIDER_SITE_OTHER): Payer: Medicare HMO | Admitting: Neurology

## 2016-12-20 ENCOUNTER — Encounter: Payer: Self-pay | Admitting: Neurology

## 2016-12-20 VITALS — BP 104/68 | HR 76 | Ht 70.0 in | Wt 177.0 lb

## 2016-12-20 DIAGNOSIS — G20 Parkinson's disease: Secondary | ICD-10-CM | POA: Diagnosis not present

## 2016-12-20 DIAGNOSIS — G4752 REM sleep behavior disorder: Secondary | ICD-10-CM | POA: Diagnosis not present

## 2016-12-20 MED ORDER — CLONAZEPAM 0.5 MG PO TABS: 0.2500 mg | ORAL_TABLET | Freq: Every day | ORAL | 1 refills | Status: DC

## 2016-12-20 NOTE — Patient Instructions (Signed)
1.  Make sure that you are taking carbidopa/levodopa 25/100 at 7am/11am/4pm.   2.  Start klonopin - 0.5 - 1/2 tablet at night before bedtime for the dreams 3.  Start PT at [facility] 4.  Look into rock steady boxing at [facility]

## 2016-12-26 ENCOUNTER — Encounter: Payer: Self-pay | Admitting: Podiatry

## 2016-12-26 ENCOUNTER — Ambulatory Visit (INDEPENDENT_AMBULATORY_CARE_PROVIDER_SITE_OTHER): Payer: Medicare HMO | Admitting: Podiatry

## 2016-12-26 VITALS — BP 142/85 | HR 79 | Temp 98.5°F | Resp 16

## 2016-12-26 DIAGNOSIS — Q828 Other specified congenital malformations of skin: Secondary | ICD-10-CM | POA: Diagnosis not present

## 2016-12-26 DIAGNOSIS — D689 Coagulation defect, unspecified: Secondary | ICD-10-CM

## 2016-12-26 DIAGNOSIS — R234 Changes in skin texture: Secondary | ICD-10-CM | POA: Diagnosis not present

## 2016-12-26 DIAGNOSIS — L84 Corns and callosities: Secondary | ICD-10-CM

## 2016-12-26 DIAGNOSIS — M2031 Hallux varus (acquired), right foot: Secondary | ICD-10-CM | POA: Diagnosis not present

## 2016-12-26 NOTE — Progress Notes (Signed)
   Subjective:    Patient ID: Walter [name], male    DOB: 1931/08/29, 83 y.o.   MRN: [id number]  HPI this patient presents to the office with chief complaint of a painful callus on the tip of his right big toe  Patient says that this callus comes and goes and he usually gets pedicures which help with this callus .  He says that this callus is now opened up and causing pain.  He says he has been soaking this area in Epsom salts which helps, but he still experiences pain when he walks.  He states he has had an amputation of the second toe on the right foot, which has led to significant toe deformities on his right foot. He presents the office today for an evaluation and treatment of this painful callus.    Review of Systems  Endocrine: Positive for cold intolerance.  Musculoskeletal: Positive for gait problem.  Neurological: Positive for tremors.  Hematological: Bruises/bleeds easily.       Objective:   Physical Exam General Appearance  Alert, conversant and in no acute stress.  Vascular  Dorsalis pedis and posterior pulses are weak   bilaterally.  Capillary return is within normal limits  Bilaterally. Temperature is within normal limits  Bilaterally  Neurologic  Senn-Weinstein monofilament wire test within normal limits  bilaterally. Muscle power  Within normal limits bilaterally.  Nails normal nails noted on both feet.. No evidence of bacterial infection or drainage bilaterally.  Orthopedic  No limitations of motion of motion feet bilaterally.  No crepitus or effusions noted.  There is an HAV deformity of the first MPJ bilaterally. Amputation of the second toe of the right foot third toe is severely contracted and is elevated.  Hallux malleus the IPJ of the right foot.  Asymptomatic corn on the second digit, left foot.  DJD MC J right foot.  Skin  normotropic skin with no porokeratosis noted bilaterally.  No signs of infections or ulcers noted.          Assessment & Plan:  Fissure with  corn right hallux secondary  to IPJ right hallux.   IE  Debridement of callus with dremel tool.  Neosporin and dry sterile dressing was applied.  Patient was told to soak his foot for the next 3 days and bandaging.  Padding was dispensed to be worn at the site of the callus.  He was told to return to the office in 10 weeks for further evaluation and treatment   Gardiner Barefoot DPM

## 2016-12-28 DIAGNOSIS — M9903 Segmental and somatic dysfunction of lumbar region: Secondary | ICD-10-CM | POA: Diagnosis not present

## 2016-12-28 DIAGNOSIS — M5416 Radiculopathy, lumbar region: Secondary | ICD-10-CM | POA: Diagnosis not present

## 2016-12-28 DIAGNOSIS — M955 Acquired deformity of pelvis: Secondary | ICD-10-CM | POA: Diagnosis not present

## 2016-12-28 DIAGNOSIS — M9905 Segmental and somatic dysfunction of pelvic region: Secondary | ICD-10-CM | POA: Diagnosis not present

## 2017-01-02 DIAGNOSIS — G20 Parkinson's disease: Secondary | ICD-10-CM | POA: Diagnosis not present

## 2017-01-02 DIAGNOSIS — R262 Difficulty in walking, not elsewhere classified: Secondary | ICD-10-CM | POA: Diagnosis not present

## 2017-01-06 ENCOUNTER — Ambulatory Visit: Payer: Medicare HMO

## 2017-01-24 ENCOUNTER — Ambulatory Visit (INDEPENDENT_AMBULATORY_CARE_PROVIDER_SITE_OTHER): Payer: Medicare HMO

## 2017-01-24 DIAGNOSIS — Z23 Encounter for immunization: Secondary | ICD-10-CM | POA: Diagnosis not present

## 2017-02-10 ENCOUNTER — Inpatient Hospital Stay: Payer: Medicare HMO | Admitting: Hematology and Oncology

## 2017-02-10 ENCOUNTER — Encounter: Payer: Self-pay | Admitting: Oncology

## 2017-02-10 ENCOUNTER — Inpatient Hospital Stay: Payer: Medicare HMO

## 2017-02-10 NOTE — Progress Notes (Deleted)
[facility] day:  02/10/2017  Chief Complaint: Walter [name] is a 83 y.o. male with a saddle pulmonary embolism and lower extremity DVT who is seen for 6 month assessment.  HPI:  The patient was last seen in the hematology clinic on 08/12/2016.  At that time, he felt "the same".  He had a 2-3 year history of a 30-40 pound weight loss (stable in the past year).  He choked easily.  He had a history of nephrolithiasis and hematuria.  He declined endoscopy.  He continued Eliquis.  During the interim,    Past Medical History:  Diagnosis Date  . ARF (acute renal failure) ([place]) 2011   after TKR  . BPH (benign prostatic hypertrophy)   . DVT (deep venous thrombosis) ([place]) 1998   after left knee arthroscopy  . Hx of colonoscopy 2000  . Hypertension   . Hypothyroid 2007   postop from thyroidectomy (cancer scare)  . Kidney stones    recurrent  . Oral cancer ([place]) 2004   graft from left thigh  . Osteoarthritis of both knees   . Parkinson's disease ([place])   . Tendonitis 2012   from quinolone  . Toe amputation status ([place]) 2010    Past Surgical History:  Procedure Laterality Date  . APPENDECTOMY    . CHOLECYSTECTOMY    . HERNIA REPAIR    . LITHOTRIPSY  2009/2010   then stent and cystoscopic removal 2014  . Oral cancer removed Right 2004  . REPAIR ZENKER'S DIVERTICULA  2013  . THYROIDECTOMY  2007  . TOE AMPUTATION Right 2010   badly overlapping other toe  . TOTAL KNEE ARTHROPLASTY Left   . TRANSURETHRAL RESECTION OF PROSTATE  2011  . UMBILICAL HERNIA REPAIR  2007    Family History  Problem Relation Age of Onset  . Dementia Mother   . Stroke Father   . Pulmonary disease Brother   . Heart disease Neg Hx   . Diabetes Neg Hx     Social History:  reports that he quit smoking about 14 years ago. His smoking use included cigarettes. he has never used smokeless tobacco. He reports that he drinks alcohol. He reports that he does not use  drugs.  He previously smoked 2-3 cigars/day for 30-40 years.  He stopped smoking in 2004.  He previously lived in [place].  He lives in [place].  They moved to [facility]  He and his wife met in junior high school.  They have been married 88 years.  The patient is accompanied by his wife, Walter [name], today.  Allergies:  Allergies  Allergen Reactions  . Quinolones     tendonitis    Current Medications: Current Outpatient Medications  Medication Sig Dispense Refill  . amLODipine (NORVASC) 5 MG tablet TAKE 1 TABLET EVERY DAY 90 tablet 3  . carbidopa-levodopa (SINEMET IR) 25-100 MG tablet TAKE 1 TABLET THREE TIMES [name] 270 tablet 3  . Cholecalciferol (VITAMIN D) 2000 UNITS CAPS Take by mouth [name].    . clonazePAM (KLONOPIN) 0.5 MG tablet Take 0.5 tablets (0.25 mg total) by mouth at bedtime. 45 tablet 1  . ELIQUIS 5 MG TABS tablet TAKE 1 TABLET TWICE [name] 60 tablet 0  . levothyroxine (SYNTHROID, LEVOTHROID) 112 MCG tablet TAKE 1 TABLET EVERY DAY BEFORE BREAKFAST 90 tablet 3  . polyethylene glycol (MIRALAX / GLYCOLAX) packet Take 17 g by mouth [name] as needed.     . vitamin B-12 (CYANOCOBALAMIN) 1000 MCG  tablet Take 1,000 mcg by mouth [name].     No current facility-administered medications for this visit.     Review of Systems:  GENERAL:  Feels "the same".  No fevers or sweats.  Weight loss of 30-40 pounds in 2-3 years.  Weight down 3 pounds since last visit. PERFORMANCE STATUS (ECOG):  1 HEENT:   No visual changes, runny nose, sore throat, mouth sores or tenderness. Lungs: No shortness of breath or cough.  No hemoptysis. Cardiac:  No chest pain, palpitations, orthopnea, or PND. GI:   Takes small bites and chews food well.  No nausea, vomiting, diarrhea, constipation, melena or hematochezia. GU:  No urgency, frequency, dysuria.  Hematuria with a h/o nephrolithiasis. Musculoskeletal:  No back pain.  No joint pain.  No muscle tenderness. Extremities:  No pain or swelling. Skin:   No rashes or skin changes. Neuro:  No headache, numbness or weakness, balance or coordination issues. Endocrine:  No diabetes.  Thyroid disease on Synthroid.  No hot flashes or night sweats. Psych:  No mood changes, depression or anxiety. Pain:  No focal pain. Review of systems:  All other systems reviewed and found to be negative.  Physical Exam: There were no vitals taken for this visit. GENERAL:  Well developed, well nourished, gentleman sitting comfortably in the exam room in no acute distress. MENTAL STATUS:  Alert and oriented to person, place and time. HEAD:  Pearline Cables hair.  Normocephalic, atraumatic, face symmetric, no Cushingoid features. EYES:  Blue eyes.  Pupils equal round and reactive to light and accomodation.  No conjunctivitis or scleral icterus. ENT:  Oropharynx clear without lesion.  Cheek graft.  Tongue normal. Mucous membranes moist.  RESPIRATORY:  Clear to auscultation without rales, wheezes or rhonchi. CARDIOVASCULAR:  Regular rate and rhythm without murmur, rub or gallop. ABDOMEN:  Soft, non-tender, with active bowel sounds, and no hepatosplenomegaly.  No masses. SKIN:  No rashes, ulcers or lesions. EXTREMITIES: Chronic lower extremity changes (left > right).  No skin discoloration or tenderness.  No palpable cords. LYMPH NODES: No palpable cervical, supraclavicular, axillary or inguinal adenopathy  NEUROLOGICAL: Unremarkable. PSYCH:  Appropriate.   No visits with results within 3 Day(s) from this visit.  Latest known visit with results is:  Orders Only on 10/28/2016  Component Date Value Ref Range Status  . Sodium 10/28/2016 141  135 - 145 mmol/L Final  . Potassium 10/28/2016 4.4  3.5 - 5.1 mmol/L Final  . Chloride 10/28/2016 101  101 - 111 mmol/L Final  . CO2 10/28/2016 31  22 - 32 mmol/L Final  . Glucose, Bld 10/28/2016 99  65 - 99 mg/dL Final  . BUN 10/28/2016 26* 6 - 20 mg/dL Final  . Creatinine, Ser 10/28/2016 1.76* 0.61 - 1.24 mg/dL Final  . Calcium  10/28/2016 9.4  8.9 - 10.3 mg/dL Final  . GFR calc non Af Amer 10/28/2016 34* >60 mL/min Final  . GFR calc Af Amer 10/28/2016 39* >60 mL/min Final   Comment: (NOTE) The eGFR has been calculated using the CKD EPI equation. This calculation has not been validated in all clinical situations. eGFR's persistently <60 mL/min signify possible Chronic Kidney Disease.   . Anion gap 10/28/2016 9  5 - 15 Final  . WBC 10/28/2016 6.3  3.8 - 10.6 K/uL Final  . RBC 10/28/2016 4.68  4.40 - 5.90 MIL/uL Final  . Hemoglobin 10/28/2016 14.5  13.0 - 18.0 g/dL Final  . HCT 10/28/2016 41.9  40.0 - 52.0 % Final  . MCV  10/28/2016 89.5  80.0 - 100.0 fL Final  . MCH 10/28/2016 31.0  26.0 - 34.0 pg Final  . MCHC 10/28/2016 34.6  32.0 - 36.0 g/dL Final  . RDW 10/28/2016 13.8  11.5 - 14.5 % Final  . Platelets 10/28/2016 241  150 - 440 K/uL Final  . Neutrophils Relative % 10/28/2016 71  % Final  . Neutro Abs 10/28/2016 4.4  1.4 - 6.5 K/uL Final  . Lymphocytes Relative 10/28/2016 16  % Final  . Lymphs Abs 10/28/2016 1.0  1.0 - 3.6 K/uL Final  . Monocytes Relative 10/28/2016 10  % Final  . Monocytes Absolute 10/28/2016 0.7  0.2 - 1.0 K/uL Final  . Eosinophils Relative 10/28/2016 3  % Final  . Eosinophils Absolute 10/28/2016 0.2  0 - 0.7 K/uL Final  . Basophils Relative 10/28/2016 0  % Final  . Basophils Absolute 10/28/2016 0.0  0 - 0.1 K/uL Final    Assessment:  Walter [name] is a 83 y.o. male with a history of recurrent thrombosis.  He had a left lower extremity DVT in 1990 following arthroscopic surgery.  He was on Coumadin < 1 year.  He developed a pulmonary embolism on 03/03/2016 without apparent precipitating events.  Three weeks prior, he had been on a 2 1/2 hour flight.  He is on Eliquis.  Chest CT angiogram on 03/03/2016 revealed a moderate to large burden of pulmonary emboli in all lobes including a saddle embolus with no evidence of heart strain.  There was a small hiatal hernia with possible associated wall  thickening in the distal esophagus.  Bilateral lower extremity duplex on 03/04/2016 revealed an age-indeterminate short-segment occlusive DVT within distal aspect of the right femoral vein.  Hypercoagulable work-up on 03/04/2016 included the following normal studies: CBC, factor V Leiden, prothrombin gene mutation, lupus anticoagulant panel, anti-cardiolipin antibodies, beta-2 glycoprotein, protein S total (115%), protein S activity (110%), protein C activity (81%), AT III activity (84%).  Protein C total was 59% (60-150%).  Homocysteine level was 20.5 (0-15).  Creatinine was 1.63 (CrCl 43 ml/min).  Bilirubin was 1.5 (repeat 0.8 on 03/21/2016).  He is due for a colonoscopy.  He has a history of Zenker's diverticulum.   He has a history of nephrolithiasis.  CT renal stone study on 03/21/2016 revealed numerous small bilateral nonobstructing renal stones without hydronephrosis.  There was a large 7.6 cm right renal cyst which could not be fully characterized without intravenous contrast.  CT urogram and cystoscopy are planned.  He has a history of oral cancer s/p resection of his inner cheek at the [facility] in 2004.   Lab on 03/21/2016 included a normal CEA (4.2), CA19.9 (22), and PSA (3.56).  Symptomatically, he feels "the same".  He has a 2-3 year history of a 30-40 pound weight loss (stable in the past year).  He chokes easily.  He has a history of nephrolithiasis and hematuria.  Plan: 1.  Labs today:  CBC with diff, CMP. 2.  Discuss thoughts about procedures.  Patient does not want to proceed with endoscopy secondary to his age.  He wishes to defer unless he is concerned about his symptoms based on his age. 3.  Continue Eliquis.   4.  Send CMP to Dr. Silvio Pate.  Call,  Cr higher.  Needs close monitoring 5.  RTC in 6 months for MD assessment and labs (CBC with diff, CMP).   Lequita Asal, MD  02/10/2017, 5:51 AM   I saw and evaluated the patient,  participating in the key  portions of the service and reviewing pertinent diagnostic studies and records.  I reviewed the nurse practitioner's note and agree with the findings and the plan.  The assessment and plan were discussed with the patient.  Additional diagnostic studies of *** are needed to clarify *** and would change the clinical management.  A few ***multiple questions were asked by the patient and answered.   Nolon Stalls, MD 02/10/2017,5:51 AM

## 2017-02-15 DIAGNOSIS — L578 Other skin changes due to chronic exposure to nonionizing radiation: Secondary | ICD-10-CM | POA: Diagnosis not present

## 2017-02-15 DIAGNOSIS — D692 Other nonthrombocytopenic purpura: Secondary | ICD-10-CM | POA: Diagnosis not present

## 2017-02-15 DIAGNOSIS — R208 Other disturbances of skin sensation: Secondary | ICD-10-CM | POA: Diagnosis not present

## 2017-02-15 DIAGNOSIS — L821 Other seborrheic keratosis: Secondary | ICD-10-CM | POA: Diagnosis not present

## 2017-02-15 DIAGNOSIS — L570 Actinic keratosis: Secondary | ICD-10-CM | POA: Diagnosis not present

## 2017-02-15 DIAGNOSIS — L820 Inflamed seborrheic keratosis: Secondary | ICD-10-CM | POA: Diagnosis not present

## 2017-03-06 ENCOUNTER — Ambulatory Visit: Payer: Medicare HMO | Admitting: Podiatry

## 2017-03-09 ENCOUNTER — Other Ambulatory Visit: Payer: Medicare HMO

## 2017-03-10 ENCOUNTER — Inpatient Hospital Stay: Payer: Medicare HMO | Attending: Hematology and Oncology | Admitting: Hematology and Oncology

## 2017-03-10 ENCOUNTER — Inpatient Hospital Stay: Payer: Medicare HMO

## 2017-03-10 VITALS — BP 156/77 | HR 72 | Temp 96.3°F | Resp 20 | Wt 182.3 lb

## 2017-03-10 DIAGNOSIS — N400 Enlarged prostate without lower urinary tract symptoms: Secondary | ICD-10-CM

## 2017-03-10 DIAGNOSIS — Z7901 Long term (current) use of anticoagulants: Secondary | ICD-10-CM | POA: Diagnosis not present

## 2017-03-10 DIAGNOSIS — M1990 Unspecified osteoarthritis, unspecified site: Secondary | ICD-10-CM | POA: Insufficient documentation

## 2017-03-10 DIAGNOSIS — I2692 Saddle embolus of pulmonary artery without acute cor pulmonale: Secondary | ICD-10-CM

## 2017-03-10 DIAGNOSIS — I82411 Acute embolism and thrombosis of right femoral vein: Secondary | ICD-10-CM

## 2017-03-10 DIAGNOSIS — Z87442 Personal history of urinary calculi: Secondary | ICD-10-CM | POA: Diagnosis not present

## 2017-03-10 DIAGNOSIS — I10 Essential (primary) hypertension: Secondary | ICD-10-CM | POA: Diagnosis not present

## 2017-03-10 DIAGNOSIS — Z9049 Acquired absence of other specified parts of digestive tract: Secondary | ICD-10-CM

## 2017-03-10 DIAGNOSIS — Z87891 Personal history of nicotine dependence: Secondary | ICD-10-CM | POA: Diagnosis not present

## 2017-03-10 DIAGNOSIS — R634 Abnormal weight loss: Secondary | ICD-10-CM | POA: Diagnosis not present

## 2017-03-10 DIAGNOSIS — I82431 Acute embolism and thrombosis of right popliteal vein: Secondary | ICD-10-CM

## 2017-03-10 DIAGNOSIS — K449 Diaphragmatic hernia without obstruction or gangrene: Secondary | ICD-10-CM

## 2017-03-10 DIAGNOSIS — Z85819 Personal history of malignant neoplasm of unspecified site of lip, oral cavity, and pharynx: Secondary | ICD-10-CM | POA: Diagnosis not present

## 2017-03-10 DIAGNOSIS — N281 Cyst of kidney, acquired: Secondary | ICD-10-CM | POA: Insufficient documentation

## 2017-03-10 DIAGNOSIS — Z86718 Personal history of other venous thrombosis and embolism: Secondary | ICD-10-CM | POA: Diagnosis not present

## 2017-03-10 DIAGNOSIS — R1310 Dysphagia, unspecified: Secondary | ICD-10-CM | POA: Insufficient documentation

## 2017-03-10 DIAGNOSIS — Z79899 Other long term (current) drug therapy: Secondary | ICD-10-CM | POA: Diagnosis not present

## 2017-03-10 DIAGNOSIS — G20 Parkinson's disease: Secondary | ICD-10-CM

## 2017-03-10 DIAGNOSIS — Z86711 Personal history of pulmonary embolism: Secondary | ICD-10-CM

## 2017-03-10 DIAGNOSIS — E039 Hypothyroidism, unspecified: Secondary | ICD-10-CM | POA: Diagnosis not present

## 2017-03-10 LAB — COMPREHENSIVE METABOLIC PANEL
ALT: 6 U/L — ABNORMAL LOW (ref 17–63)
AST: 18 U/L (ref 15–41)
Albumin: 3.8 g/dL (ref 3.5–5.0)
Alkaline Phosphatase: 69 U/L (ref 38–126)
Anion gap: 7 (ref 5–15)
BUN: 27 mg/dL — ABNORMAL HIGH (ref 6–20)
CO2: 29 mmol/L (ref 22–32)
Calcium: 8.7 mg/dL — ABNORMAL LOW (ref 8.9–10.3)
Chloride: 105 mmol/L (ref 101–111)
Creatinine, Ser: 1.47 mg/dL — ABNORMAL HIGH (ref 0.61–1.24)
GFR calc Af Amer: 49 mL/min — ABNORMAL LOW (ref >60)
GFR calc non Af Amer: 42 mL/min — ABNORMAL LOW (ref >60)
Glucose, Bld: 99 mg/dL (ref 65–99)
Potassium: 4.3 mmol/L (ref 3.5–5.1)
Sodium: 141 mmol/L (ref 135–145)
Total Bilirubin: 0.8 mg/dL (ref 0.3–1.2)
Total Protein: 6.5 g/dL (ref 6.5–8.1)

## 2017-03-10 LAB — CBC WITH DIFFERENTIAL/PLATELET
Basophils Absolute: 0 10*3/uL (ref 0–0.1)
Basophils Relative: 0 %
Eosinophils Absolute: 0.1 10*3/uL (ref 0–0.7)
Eosinophils Relative: 1 %
HCT: 42.4 % (ref 40.0–52.0)
Hemoglobin: 14.1 g/dL (ref 13.0–18.0)
Lymphocytes Relative: 12 %
Lymphs Abs: 1 10*3/uL (ref 1.0–3.6)
MCH: 30.5 pg (ref 26.0–34.0)
MCHC: 33.3 g/dL (ref 32.0–36.0)
MCV: 91.5 fL (ref 80.0–100.0)
Monocytes Absolute: 0.5 10*3/uL (ref 0.2–1.0)
Monocytes Relative: 6 %
Neutro Abs: 6.8 10*3/uL — ABNORMAL HIGH (ref 1.4–6.5)
Neutrophils Relative %: 81 %
Platelets: 206 10*3/uL (ref 150–440)
RBC: 4.63 MIL/uL (ref 4.40–5.90)
RDW: 13.7 % (ref 11.5–14.5)
WBC: 8.4 10*3/uL (ref 3.8–10.6)

## 2017-03-10 NOTE — Progress Notes (Signed)
[facility] day:  03/10/2017  Chief Complaint: Walter [name] is a 84 y.o. male with a saddle pulmonary embolism and lower extremity DVT who is seen for 7 month assessment.  HPI:  The patient was last seen in the hematology clinic on 08/12/2016.  At that time, he felt "the same".  He had a 2-3 year history of a 30-40 pound weight loss (stable in the past year).  He choked easily.  He had a history of nephrolithiasis and hematuria.  He declined endoscopy.  He continued Eliquis.  During the interim, patient has been doing "pretty good". Patient denies any acute physical concerns. Patient is eating well. His weight has increased by 5 pounds. He notes that his dysphagia as improved. Patient continues on anticoagulation therapy (Eliquis). Patient denies bleeding; no hematochezia, melena, or gross hematuria. Patient notes that he bruises very easily. Patient has experienced a single atraumatic fall. He states, "I had a bad dream and fell out of bed".   He is participating in a "boxing class" for patients with Parkinson's at [facility].  He denies pain in the clinic today.    Past Medical History:  Diagnosis Date  . ARF (acute renal failure) ([place]) 2011   after TKR  . BPH (benign prostatic hypertrophy)   . DVT (deep venous thrombosis) ([place]) 1998   after left knee arthroscopy  . Hx of colonoscopy 2000  . Hypertension   . Hypothyroid 2007   postop from thyroidectomy (cancer scare)  . Kidney stones    recurrent  . Oral cancer ([place]) 2004   graft from left thigh  . Osteoarthritis of both knees   . Parkinson's disease ([place])   . Tendonitis 2012   from quinolone  . Toe amputation status ([place]) 2010    Past Surgical History:  Procedure Laterality Date  . APPENDECTOMY    . CHOLECYSTECTOMY    . HERNIA REPAIR    . LITHOTRIPSY  2009/2010   then stent and cystoscopic removal 2014  . Oral cancer removed Right 2004  . REPAIR ZENKER'S DIVERTICULA  2013  .  THYROIDECTOMY  2007  . TOE AMPUTATION Right 2010   badly overlapping other toe  . TOTAL KNEE ARTHROPLASTY Left   . TRANSURETHRAL RESECTION OF PROSTATE  2011  . UMBILICAL HERNIA REPAIR  2007    Family History  Problem Relation Age of Onset  . Dementia Mother   . Stroke Father   . Pulmonary disease Brother   . Heart disease Neg Hx   . Diabetes Neg Hx     Social History:  reports that he quit smoking about 14 years ago. His smoking use included cigarettes. he has never used smokeless tobacco. He reports that he drinks alcohol. He reports that he does not use drugs.  He previously smoked 2-3 cigars/day for 30-40 years.  He stopped smoking in 2004.  He previously lived in [place].  He lives in [place].  They moved to [facility].  He and his wife met in junior high school.  They have been married 85 years.  The patient is accompanied by his wife, [name], today.  Allergies:  Allergies  Allergen Reactions  . Quinolones     tendonitis    Current Medications: Current Outpatient Medications  Medication Sig Dispense Refill  . amLODipine (NORVASC) 5 MG tablet TAKE 1 TABLET EVERY DAY 90 tablet 3  . carbidopa-levodopa (SINEMET IR) 25-100 MG tablet TAKE 1 TABLET THREE TIMES DAILY 270  tablet 3  . Cholecalciferol (VITAMIN D) 2000 UNITS CAPS Take by mouth daily.    . clonazePAM (KLONOPIN) 0.5 MG tablet Take 0.5 tablets (0.25 mg total) by mouth at bedtime. 45 tablet 1  . ELIQUIS 5 MG TABS tablet TAKE 1 TABLET TWICE DAILY 60 tablet 0  . levothyroxine (SYNTHROID, LEVOTHROID) 112 MCG tablet TAKE 1 TABLET EVERY DAY BEFORE BREAKFAST 90 tablet 3  . polyethylene glycol (MIRALAX / GLYCOLAX) packet Take 17 g by mouth daily as needed.     . vitamin B-12 (CYANOCOBALAMIN) 1000 MCG tablet Take 1,000 mcg by mouth daily.     No current facility-administered medications for this visit.     Review of Systems:  GENERAL:  Feels "pretty good".  No fevers or sweats.  Weight loss of 30-40 pounds in 2-3  years.  Weight up 5 pounds since last visit. PERFORMANCE STATUS (ECOG):  1 HEENT:   No visual changes, runny nose, sore throat, mouth sores or tenderness. Lungs: No shortness of breath or cough.  No hemoptysis. Cardiac:  No chest pain, palpitations, orthopnea, or PND. GI:   Takes small bites and chews food well.  No nausea, vomiting, diarrhea, constipation, melena or hematochezia. GU:  No urgency, frequency, dysuria.  Hematuria with a h/o nephrolithiasis. Musculoskeletal:  No back pain.  No joint pain.  No muscle tenderness. Extremities:  No pain or swelling. Skin:  No rashes or skin changes. Neuro:  Parkinson's.  No headache, numbness or weakness, balance or coordination issues. Endocrine:  No diabetes.  Thyroid disease on Synthroid.  No hot flashes or night sweats. Psych:  No mood changes, depression or anxiety. Pain:  No focal pain. Review of systems:  All other systems reviewed and found to be negative.  Physical Exam: Blood pressure (!) 156/77, pulse 72, temperature (!) 96.3 F (35.7 C), temperature source Tympanic, resp. rate 20, weight 182 lb 5 oz (82.7 kg). GENERAL:  Well developed, well nourished, gentleman sitting comfortably in the exam room in no acute distress.  He requires assistance onto exam table. MENTAL STATUS:  Alert and oriented to person, place and time. HEAD:  Short gray hair.  Normocephalic, atraumatic, face symmetric, no Cushingoid features. EYES:  Blue eyes.  Pupils equal round and reactive to light and accomodation.  No conjunctivitis or scleral icterus. ENT:  Oropharynx clear without lesion.  Cheek graft.  Tongue normal. Mucous membranes moist.  RESPIRATORY:  Clear to auscultation without rales, wheezes or rhonchi. CARDIOVASCULAR:  Regular rate and rhythm without murmur, rub or gallop. ABDOMEN:  Soft, non-tender, with active bowel sounds, and no hepatosplenomegaly.  No masses. SKIN:  No rashes, ulcers or lesions. EXTREMITIES: Chronic ankle edmea (left > right).   No skin discoloration or tenderness.  No palpable cords. LYMPH NODES: No palpable cervical, supraclavicular, axillary or inguinal adenopathy  NEUROLOGICAL: Unremarkable. PSYCH:  Appropriate.   Appointment on 03/10/2017  Component Date Value Ref Range Status  . WBC 03/10/2017 8.4  3.8 - 10.6 K/uL Final  . RBC 03/10/2017 4.63  4.40 - 5.90 MIL/uL Final  . Hemoglobin 03/10/2017 14.1  13.0 - 18.0 g/dL Final  . HCT 03/10/2017 42.4  40.0 - 52.0 % Final  . MCV 03/10/2017 91.5  80.0 - 100.0 fL Final  . MCH 03/10/2017 30.5  26.0 - 34.0 pg Final  . MCHC 03/10/2017 33.3  32.0 - 36.0 g/dL Final  . RDW 03/10/2017 13.7  11.5 - 14.5 % Final  . Platelets 03/10/2017 206  150 - 440 K/uL Final  .  Neutrophils Relative % 03/10/2017 81  % Final  . Neutro Abs 03/10/2017 6.8* 1.4 - 6.5 K/uL Final  . Lymphocytes Relative 03/10/2017 12  % Final  . Lymphs Abs 03/10/2017 1.0  1.0 - 3.6 K/uL Final  . Monocytes Relative 03/10/2017 6  % Final  . Monocytes Absolute 03/10/2017 0.5  0.2 - 1.0 K/uL Final  . Eosinophils Relative 03/10/2017 1  % Final  . Eosinophils Absolute 03/10/2017 0.1  0 - 0.7 K/uL Final  . Basophils Relative 03/10/2017 0  % Final  . Basophils Absolute 03/10/2017 0.0  0 - 0.1 K/uL Final   Performed at [facility], [address] [place], [place] [address]    Assessment:  Walter [name] is a 84 y.o. male with a history of recurrent thrombosis.  He had a left lower extremity DVT in 1990 following arthroscopic surgery.  He was on Coumadin < 1 year.  He developed a pulmonary embolism on 03/03/2016 without apparent precipitating events.  Three weeks prior, he had been on a 2 1/2 hour flight.  He is on Eliquis.  Chest CT angiogram on 03/03/2016 revealed a moderate to large burden of pulmonary emboli in all lobes including a saddle embolus with no evidence of heart strain.  There was a small hiatal hernia with possible associated wall thickening in the distal esophagus.  Bilateral lower extremity  duplex on 03/04/2016 revealed an age-indeterminate short-segment occlusive DVT within distal aspect of the right femoral vein.  Hypercoagulable work-up on 03/04/2016 included the following normal studies: CBC, factor V Leiden, prothrombin gene mutation, lupus anticoagulant panel, anti-cardiolipin antibodies, beta-2 glycoprotein, protein S total (115%), protein S activity (110%), protein C activity (81%), AT III activity (84%).  Protein C total was 59% (60-150%).  Homocysteine level was 20.5 (0-15).  Creatinine was 1.63 (CrCl 43 ml/min).  Bilirubin was 1.5 (repeat 0.8 on 03/21/2016).  He is due for a colonoscopy.  He has a history of Zenker's diverticulum.   He has a history of nephrolithiasis.  CT renal stone study on 03/21/2016 revealed numerous small bilateral nonobstructing renal stones without hydronephrosis.  There was a large 7.6 cm right renal cyst which could not be fully characterized without intravenous contrast.  CT urogram and cystoscopy are planned.  He has a history of oral cancer s/p resection of his inner cheek at the [facility] in 2004.   Lab on 03/21/2016 included a normal CEA (4.2), CA19.9 (22), and PSA (3.56).  Symptomatically, he feels well. Patient denies any acute physical concerns today. Patient is eating well and has gained 5 pounds. Dysphagia has improved.  He is more active. He is participating in a boxing class at [facility]. Exam reveals ankle edema.  Labs are unremarkable.   Plan: 1.  Labs today:  CBC with diff, CMP. 2.  Continue Eliquis as previously prescribed.  3.  RTC in 6 months for MD assessment and labs (CBC with diff, CMP).   Honor Loh, NP  03/10/2017, 11:40 AM   I saw and evaluated the patient, participating in the key portions of the service and reviewing pertinent diagnostic studies and records.  I reviewed the nurse practitioner's note and agree with the findings and the plan.  The assessment and plan were discussed with the patient.  A few  questions were asked by the patient and answered.   Nolon Stalls, MD 03/10/2017,11:40 AM

## 2017-03-10 NOTE — Progress Notes (Signed)
Patient offers no complaints today. 

## 2017-03-12 ENCOUNTER — Encounter: Payer: Self-pay | Admitting: Hematology and Oncology

## 2017-03-20 DIAGNOSIS — M9905 Segmental and somatic dysfunction of pelvic region: Secondary | ICD-10-CM | POA: Diagnosis not present

## 2017-03-20 DIAGNOSIS — M5416 Radiculopathy, lumbar region: Secondary | ICD-10-CM | POA: Diagnosis not present

## 2017-03-20 DIAGNOSIS — M9903 Segmental and somatic dysfunction of lumbar region: Secondary | ICD-10-CM | POA: Diagnosis not present

## 2017-03-20 DIAGNOSIS — M955 Acquired deformity of pelvis: Secondary | ICD-10-CM | POA: Diagnosis not present

## 2017-04-12 ENCOUNTER — Other Ambulatory Visit: Payer: Self-pay | Admitting: Hematology and Oncology

## 2017-04-12 ENCOUNTER — Telehealth: Payer: Self-pay | Admitting: *Deleted

## 2017-04-12 NOTE — Telephone Encounter (Signed)
Patient came to the cancer center today and requested to speak to a nurse. He wanted to be reassured that his Eliquis prescription was sent to the [facility]. Reviewed chart and I reassured patient that script was sent by Dr.Corcoran at 650 am today. Pt thanked me for my time.

## 2017-04-17 DIAGNOSIS — M9903 Segmental and somatic dysfunction of lumbar region: Secondary | ICD-10-CM | POA: Diagnosis not present

## 2017-04-17 DIAGNOSIS — M5416 Radiculopathy, lumbar region: Secondary | ICD-10-CM | POA: Diagnosis not present

## 2017-04-17 DIAGNOSIS — M955 Acquired deformity of pelvis: Secondary | ICD-10-CM | POA: Diagnosis not present

## 2017-04-17 DIAGNOSIS — M9905 Segmental and somatic dysfunction of pelvic region: Secondary | ICD-10-CM | POA: Diagnosis not present

## 2017-04-25 ENCOUNTER — Ambulatory Visit: Payer: Medicare HMO | Admitting: Neurology

## 2017-04-27 NOTE — Progress Notes (Addendum)
  Walter [name] was seen today in the movement disorders clinic for neurologic consultation at the request of Letvak, Richard I, MD.  The consultation is for the evaluation of Parkinson's disease.  The patient reports he has never seen a neurologist previously.  This patient is accompanied in the office by his spouse who supplements the history.  I have had the opportunity to review records from his primary care physician regarding the diagnosis.  The patient was diagnosed with Parkinson's disease by his primary care physician in June, 2016 and placed on levodopa.  The patient indicates that after starting the medication, he felt that his symptoms were much better.  His balance was better.  Reports that Tai Chi helped his balance as well.  Pt reports that his first sx was slowness, shuffling and balance trouble.    04/01/16 update:  Pt f/u today, accompanied by his wife who supplements the hx.  On carbidopa/levodopa 25/100 tid.  Pt denies falls.  Pt denies lightheadedness, near syncope.  No hallucinations.  Mood has been good.  The records that were made available to me were reviewed.  He had a MBE on 12/11/15 and there was moderate oropharyngeal dysphagia.  There was laryngeal penetration of nectar thick liquid.  Small zenkers diverticulum noted.  Regular diet recommended.  Asks about going to ST and that is his biggest c/o.  Speech getting quiet.  Used to enjoy singing and now can't and would like to get back to that.  MRI brain done and reviewed on 12/03/15.  Demonstrated only mod atrophy and mod small vessel disease.  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  He was d/c on 03/05/16 on eliquis.  Back on the upswing but not been able to exercise yet.    08/18/16 update:  Patient seen today in follow-up.  He is accompanied by his wife who supplements the history.  Patient remains on carbidopa/levodopa 25/100, one tablet 3 times per day.  He did complete LSVT big and loud since  our last visit.  He states that no falls.  He is not exercising.  States that is because his wife is getting ready to have back surgery and decided not to enter a program until she has gotten well.  He is still taking his B12 liquid supplement.  12/20/16 update: Patient is seen today in follow-up for his parkinsonism.  He is accompanied by his wife who supplements the history.  The patient is supposed to be on carbidopa/levodopa 25/100, 1 tablet 3 times per day but admits he often misses the middle of the day dosage.  Wife notes more shuffling.  Records have been reviewed since our last visit.  He has had his yearly physical since last visit.  He has had some falls.  States that he was feeling "pretty good" and he was gardening and he tripped himself on roots and he had to have some help by a neighbor getting off the ground.  He has also fallen out of bed 2 times with active dreams.  He actually put a hold in the wall with his shoulder/elbow when falling out of the bed. No lightheadedness or near syncope.  No hallucinations.  No visual distortions.  04/28/17 update: Patient is seen today in follow-up for parkinsonism.  He is accompanied by his wife who supplements the history.  Talked to the patient last visit about making sure he takes the middle of the day dose of his levodopa.    He is on carbidopa/levodopa 25/100, 1 tablet 3 times per day.  No falls.  No lightheadedness.  No hallucinations.  He is doing rock Public librarian.  We added low-dose clonazepam last visit for REM behavior disorder.  He reports that he d/c it due to fact that it made him feel like a zombie and he had a hang over effect.  He tried it for 3 nights.  It did help the dreams.  He d/c it but has had no violent dreams.   records were reviewed since last visit.  He saw hematology/oncology on March 10, 2017.  He remains on Eliquis.  He has some swallow trouble due to zenkers and he has to be careful.  PREVIOUS MEDICATIONS: Sinemet; klonopin -  0.25 mg with hang over effect  ALLERGIES:   Allergies  Allergen Reactions  . Quinolones     tendonitis    CURRENT MEDICATIONS:  Outpatient Encounter Medications as of 04/28/2017  Medication Sig  . amLODipine (NORVASC) 5 MG tablet TAKE 1 TABLET EVERY DAY  . carbidopa-levodopa (SINEMET IR) 25-100 MG tablet TAKE 1 TABLET THREE TIMES DAILY  . Cholecalciferol (VITAMIN D) 2000 UNITS CAPS Take by mouth daily.  Marland Kitchen ELIQUIS 5 MG TABS tablet TAKE 1 TABLET TWICE DAILY  . levothyroxine (SYNTHROID, LEVOTHROID) 112 MCG tablet TAKE 1 TABLET EVERY DAY BEFORE BREAKFAST  . polyethylene glycol (MIRALAX / GLYCOLAX) packet Take 17 g by mouth daily as needed.   . vitamin B-12 (CYANOCOBALAMIN) 1000 MCG tablet Take 1,000 mcg by mouth daily.  . [DISCONTINUED] clonazePAM (KLONOPIN) 0.5 MG tablet Take 0.5 tablets (0.25 mg total) by mouth at bedtime.   No facility-administered encounter medications on file as of 04/28/2017.     PAST MEDICAL HISTORY:   Past Medical History:  Diagnosis Date  . ARF (acute renal failure) ([place]) 2011   after TKR  . BPH (benign prostatic hypertrophy)   . DVT (deep venous thrombosis) ([place]) 1998   after left knee arthroscopy  . Hx of colonoscopy 2000  . Hypertension   . Hypothyroid 2007   postop from thyroidectomy (cancer scare)  . Kidney stones    recurrent  . Oral cancer ([place]) 2004   graft from left thigh  . Osteoarthritis of both knees   . Parkinson's disease ([place])   . Tendonitis 2012   from quinolone  . Toe amputation status ([place]) 2010    PAST SURGICAL HISTORY:   Past Surgical History:  Procedure Laterality Date  . APPENDECTOMY    . CHOLECYSTECTOMY    . HERNIA REPAIR    . LITHOTRIPSY  2009/2010   then stent and cystoscopic removal 2014  . Oral cancer removed Right 2004  . REPAIR ZENKER'S DIVERTICULA  2013  . THYROIDECTOMY  2007  . TOE AMPUTATION Right 2010   badly overlapping other toe  . TOTAL KNEE ARTHROPLASTY Left   . TRANSURETHRAL RESECTION OF PROSTATE   2011  . UMBILICAL HERNIA REPAIR  2007    SOCIAL HISTORY:   Social History   Socioeconomic History  . Marital status: Married    Spouse name: Not on file  . Number of children: 3  . Years of education: Not on file  . Highest education level: Not on file  Social Needs  . Financial resource strain: Not on file  . Food insecurity - worry: Not on file  . Food insecurity - inability: Not on file  . Transportation needs - medical: Not on file  . Transportation needs - non-medical: Not on  file  Occupational History  . 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 Smoking status: Former Smoker    Types: Cigarettes    Last attempt to quit: 04/27/2002    Years since quitting: 15.0  . Smokeless tobacco: Never Used  Substance and Sexual Activity  . Alcohol use: Yes    Alcohol/week: 0.0 oz    Comment: one drink daily (wine or scotch)  . Drug use: No  . Sexual activity: Not on file  Other Topics Concern  . Not on file  Social History Narrative   Son works for [employer]   1 daughter in [place], other in [place] living will   Wife, then son, would be health care POA   He isn't sure about DNR--will accept resuscitation for now   No tube feeds if cognitively unaware    FAMILY HISTORY:   Family Status  Relation Name Status  . Mother  Deceased at age 80       old age  . Father  Deceased at age 33       stroke  . Brother  Deceased       unknown-- had moved away  . Child 3,healthy Alive  . Neg Hx  (Not Specified)    ROS:  A complete 10 system review of systems was obtained and was unremarkable apart from what is mentioned above.  PHYSICAL EXAMINATION:    VITALS:   Vitals:   04/28/17 1325  BP: 132/80  Pulse: 76  SpO2: 96%  Weight: 180 lb (81.6 kg)  Height: '5\' 10"'$  (1.778 m)    GEN:  The patient appears stated age and is in NAD. HEENT:  Normocephalic, atraumatic.  The mucous membranes are moist.  The superficial temporal arteries are without ropiness or tenderness. CV:  RRR Lungs:  CTAB Neck/HEME:  There are no carotid bruits bilaterally.  Neurological examination:  Orientation: The patient is alert and oriented x3. Cranial nerves: There is good facial symmetry with facial hypomimia.  The visual fields are full to confrontational testing. The speech is fluent and clear.  He is hypophonic.  Soft palate rises symmetrically and there is no tongue deviation. Hearing is intact to conversational tone. Sensation: Sensation is intact to light touch throughout. Motor: Strength is at least antigravity x 4.   Movement examination: Tone: There is mild tone increased in the LUE Abnormal movements: There is no tremor today Coordination:  There is no decremation, with any form of RAMS, including alternating supination and pronation of the forearm, hand opening and closing, finger taps, heel taps and toe taps. Gait and Station: The patient has mild difficulty arising out of a deep-seated chair.  The patient's stride length is decreased with decreased arm swing bilaterally  Lab Results  Component Value Date   VITAMINB12 943 (H) 08/25/2014     ASSESSMENT/PLAN:  1.  Idiopathic Parkinson's disease  -We discussed the diagnosis as well as pathophysiology of the disease.  We discussed treatment options as well as prognostic indicators.  Patient education was provided.  -needs to be more consistent with tid dosing with carbidopa/levodopa 25/100.  He missed the middle of the day dose today (and last visit). Told him he needs to get a watch alarm but wife reports that he is doing better.  -needs PT.  He refuses because finds RSB so helpful.  I encouraged him to add in the Wed [facility] moves classes.  2.  Dysphagia  -he has a zenkers diverticulum.  He had a MBE on 12/11/15 and there was moderate oropharyngeal dysphagia.  There was laryngeal penetration of nectar thick liquid.  Small zenkers diverticulum  noted.  Regular diet recommended. He has been fairly stable in this regard but has to be careful.  He has a new partial and finds that if he focuses on eating and not talking, he does better.    3.  B12 deficiency  -continue this faithfully. His B12 was 192 was prior to starting supplement.  4.  REM behavior disorder  -This is commonly associated with PD and the patient is experiencing this.  We discussed that this can be very serious and even harmful.  He fell out of the bed and put a hole in the wall.   We talked about medications as well as physical barriers to put in the bed (particularly soft bed rails, pillow barriers).  We talked about moving the night stand so that it is not so close to the side of the bed.  -he d/c klonopin as had hang over effect.  Needs to use the physical barriers and uses bed rails.  Talked about rozerem if needed in the future  5.  Nocturia  -recommended bedside urinal. Explained why night cath would not work (he asked).  Recommended med like myrbetriq and could refer to urology.  He refused for now  6.  Follow up is anticipated in the next few months, sooner should new neurologic issues arise.  Much greater than 50% of this visit was spent in counseling and coordinating care.  Total face to face time:  25 min   Cc:  Venia Carbon, MD

## 2017-04-28 ENCOUNTER — Encounter: Payer: Self-pay | Admitting: Neurology

## 2017-04-28 ENCOUNTER — Ambulatory Visit: Payer: Medicare HMO | Admitting: Neurology

## 2017-04-28 VITALS — BP 132/80 | HR 76 | Ht 70.0 in | Wt 180.0 lb

## 2017-04-28 DIAGNOSIS — G20 Parkinson's disease: Secondary | ICD-10-CM

## 2017-04-28 DIAGNOSIS — G4752 REM sleep behavior disorder: Secondary | ICD-10-CM

## 2017-05-15 DIAGNOSIS — M955 Acquired deformity of pelvis: Secondary | ICD-10-CM | POA: Diagnosis not present

## 2017-05-15 DIAGNOSIS — M9905 Segmental and somatic dysfunction of pelvic region: Secondary | ICD-10-CM | POA: Diagnosis not present

## 2017-05-15 DIAGNOSIS — M9903 Segmental and somatic dysfunction of lumbar region: Secondary | ICD-10-CM | POA: Diagnosis not present

## 2017-05-15 DIAGNOSIS — M5416 Radiculopathy, lumbar region: Secondary | ICD-10-CM | POA: Diagnosis not present

## 2017-05-17 DIAGNOSIS — L820 Inflamed seborrheic keratosis: Secondary | ICD-10-CM | POA: Diagnosis not present

## 2017-05-17 DIAGNOSIS — L578 Other skin changes due to chronic exposure to nonionizing radiation: Secondary | ICD-10-CM | POA: Diagnosis not present

## 2017-05-17 DIAGNOSIS — L821 Other seborrheic keratosis: Secondary | ICD-10-CM | POA: Diagnosis not present

## 2017-05-17 DIAGNOSIS — I788 Other diseases of capillaries: Secondary | ICD-10-CM | POA: Diagnosis not present

## 2017-05-17 DIAGNOSIS — L570 Actinic keratosis: Secondary | ICD-10-CM | POA: Diagnosis not present

## 2017-06-12 ENCOUNTER — Other Ambulatory Visit: Payer: Self-pay

## 2017-06-12 ENCOUNTER — Encounter: Payer: Self-pay | Admitting: Emergency Medicine

## 2017-06-12 ENCOUNTER — Emergency Department: Payer: Medicare HMO

## 2017-06-12 ENCOUNTER — Inpatient Hospital Stay
Admission: EM | Admit: 2017-06-12 | Discharge: 2017-06-16 | DRG: 481 | Disposition: A | Discharge status: Skilled Nursing Facility | Payer: Medicare HMO | Attending: Internal Medicine | Admitting: Internal Medicine

## 2017-06-12 DIAGNOSIS — I739 Peripheral vascular disease, unspecified: Secondary | ICD-10-CM | POA: Diagnosis present

## 2017-06-12 DIAGNOSIS — Z7901 Long term (current) use of anticoagulants: Secondary | ICD-10-CM | POA: Diagnosis not present

## 2017-06-12 DIAGNOSIS — M25551 Pain in right hip: Secondary | ICD-10-CM | POA: Diagnosis not present

## 2017-06-12 DIAGNOSIS — Z86711 Personal history of pulmonary embolism: Secondary | ICD-10-CM | POA: Diagnosis not present

## 2017-06-12 DIAGNOSIS — R262 Difficulty in walking, not elsewhere classified: Secondary | ICD-10-CM | POA: Diagnosis not present

## 2017-06-12 DIAGNOSIS — W19XXXA Unspecified fall, initial encounter: Secondary | ICD-10-CM | POA: Diagnosis not present

## 2017-06-12 DIAGNOSIS — G20 Parkinson's disease: Secondary | ICD-10-CM | POA: Diagnosis present

## 2017-06-12 DIAGNOSIS — I10 Essential (primary) hypertension: Secondary | ICD-10-CM | POA: Diagnosis not present

## 2017-06-12 DIAGNOSIS — Z79890 Hormone replacement therapy (postmenopausal): Secondary | ICD-10-CM

## 2017-06-12 DIAGNOSIS — W010XXA Fall on same level from slipping, tripping and stumbling without subsequent striking against object, initial encounter: Secondary | ICD-10-CM | POA: Diagnosis present

## 2017-06-12 DIAGNOSIS — E890 Postprocedural hypothyroidism: Secondary | ICD-10-CM | POA: Diagnosis present

## 2017-06-12 DIAGNOSIS — N183 Chronic kidney disease, stage 3 (moderate): Secondary | ICD-10-CM | POA: Diagnosis not present

## 2017-06-12 DIAGNOSIS — Z881 Allergy status to other antibiotic agents status: Secondary | ICD-10-CM | POA: Diagnosis not present

## 2017-06-12 DIAGNOSIS — Z419 Encounter for procedure for purposes other than remedying health state, unspecified: Secondary | ICD-10-CM

## 2017-06-12 DIAGNOSIS — R1310 Dysphagia, unspecified: Secondary | ICD-10-CM | POA: Diagnosis present

## 2017-06-12 DIAGNOSIS — S72141A Displaced intertrochanteric fracture of right femur, initial encounter for closed fracture: Secondary | ICD-10-CM

## 2017-06-12 DIAGNOSIS — S72144A Nondisplaced intertrochanteric fracture of right femur, initial encounter for closed fracture: Secondary | ICD-10-CM | POA: Diagnosis not present

## 2017-06-12 DIAGNOSIS — M955 Acquired deformity of pelvis: Secondary | ICD-10-CM | POA: Diagnosis not present

## 2017-06-12 DIAGNOSIS — Z86718 Personal history of other venous thrombosis and embolism: Secondary | ICD-10-CM

## 2017-06-12 DIAGNOSIS — N3000 Acute cystitis without hematuria: Secondary | ICD-10-CM | POA: Diagnosis not present

## 2017-06-12 DIAGNOSIS — S72009A Fracture of unspecified part of neck of unspecified femur, initial encounter for closed fracture: Secondary | ICD-10-CM | POA: Diagnosis not present

## 2017-06-12 DIAGNOSIS — F05 Delirium due to known physiological condition: Secondary | ICD-10-CM | POA: Diagnosis not present

## 2017-06-12 DIAGNOSIS — I129 Hypertensive chronic kidney disease with stage 1 through stage 4 chronic kidney disease, or unspecified chronic kidney disease: Secondary | ICD-10-CM | POA: Diagnosis not present

## 2017-06-12 DIAGNOSIS — M9903 Segmental and somatic dysfunction of lumbar region: Secondary | ICD-10-CM | POA: Diagnosis not present

## 2017-06-12 DIAGNOSIS — R2681 Unsteadiness on feet: Secondary | ICD-10-CM | POA: Diagnosis not present

## 2017-06-12 DIAGNOSIS — M9905 Segmental and somatic dysfunction of pelvic region: Secondary | ICD-10-CM | POA: Diagnosis not present

## 2017-06-12 DIAGNOSIS — M6281 Muscle weakness (generalized): Secondary | ICD-10-CM | POA: Diagnosis not present

## 2017-06-12 DIAGNOSIS — M5416 Radiculopathy, lumbar region: Secondary | ICD-10-CM | POA: Diagnosis not present

## 2017-06-12 DIAGNOSIS — Z87891 Personal history of nicotine dependence: Secondary | ICD-10-CM

## 2017-06-12 DIAGNOSIS — R05 Cough: Secondary | ICD-10-CM | POA: Diagnosis not present

## 2017-06-12 DIAGNOSIS — R059 Cough, unspecified: Secondary | ICD-10-CM

## 2017-06-12 DIAGNOSIS — Z7401 Bed confinement status: Secondary | ICD-10-CM | POA: Diagnosis not present

## 2017-06-12 DIAGNOSIS — Z66 Do not resuscitate: Secondary | ICD-10-CM | POA: Diagnosis present

## 2017-06-12 DIAGNOSIS — E538 Deficiency of other specified B group vitamins: Secondary | ICD-10-CM | POA: Diagnosis not present

## 2017-06-12 DIAGNOSIS — Z96652 Presence of left artificial knee joint: Secondary | ICD-10-CM | POA: Diagnosis present

## 2017-06-12 DIAGNOSIS — S72141D Displaced intertrochanteric fracture of right femur, subsequent encounter for closed fracture with routine healing: Secondary | ICD-10-CM | POA: Diagnosis not present

## 2017-06-12 DIAGNOSIS — M79604 Pain in right leg: Secondary | ICD-10-CM | POA: Diagnosis not present

## 2017-06-12 DIAGNOSIS — Z79899 Other long term (current) drug therapy: Secondary | ICD-10-CM | POA: Diagnosis not present

## 2017-06-12 DIAGNOSIS — Z01818 Encounter for other preprocedural examination: Secondary | ICD-10-CM | POA: Diagnosis not present

## 2017-06-12 DIAGNOSIS — Z741 Need for assistance with personal care: Secondary | ICD-10-CM | POA: Diagnosis not present

## 2017-06-12 DIAGNOSIS — S72001A Fracture of unspecified part of neck of right femur, initial encounter for closed fracture: Secondary | ICD-10-CM | POA: Diagnosis not present

## 2017-06-12 DIAGNOSIS — E039 Hypothyroidism, unspecified: Secondary | ICD-10-CM | POA: Diagnosis not present

## 2017-06-12 LAB — CBC WITH DIFFERENTIAL/PLATELET
Basophils Absolute: 0 10*3/uL (ref 0–0.1)
Basophils Relative: 0 %
EOS ABS: 0.1 10*3/uL (ref 0–0.7)
Eosinophils Relative: 1 %
HCT: 41.3 % (ref 40.0–52.0)
HEMOGLOBIN: 14 g/dL (ref 13.0–18.0)
LYMPHS PCT: 14 %
Lymphs Abs: 0.9 10*3/uL — ABNORMAL LOW (ref 1.0–3.6)
MCH: 30.9 pg (ref 26.0–34.0)
MCHC: 33.8 g/dL (ref 32.0–36.0)
MCV: 91.5 fL (ref 80.0–100.0)
MONOS PCT: 9 %
Monocytes Absolute: 0.5 10*3/uL (ref 0.2–1.0)
NEUTROS PCT: 76 %
Neutro Abs: 4.7 10*3/uL (ref 1.4–6.5)
Platelets: 238 10*3/uL (ref 150–440)
RBC: 4.51 MIL/uL (ref 4.40–5.90)
RDW: 13.8 % (ref 11.5–14.5)
WBC: 6.2 10*3/uL (ref 3.8–10.6)

## 2017-06-12 LAB — BASIC METABOLIC PANEL
Anion gap: 6 (ref 5–15)
BUN: 27 mg/dL — AB (ref 6–20)
CALCIUM: 9 mg/dL (ref 8.9–10.3)
CO2: 31 mmol/L (ref 22–32)
Chloride: 104 mmol/L (ref 101–111)
Creatinine, Ser: 1.65 mg/dL — ABNORMAL HIGH (ref 0.61–1.24)
GFR calc Af Amer: 42 mL/min — ABNORMAL LOW (ref >60)
GFR, EST NON AFRICAN AMERICAN: 37 mL/min — AB (ref >60)
Glucose, Bld: 105 mg/dL — ABNORMAL HIGH (ref 65–99)
POTASSIUM: 3.8 mmol/L (ref 3.5–5.1)
Sodium: 141 mmol/L (ref 135–145)

## 2017-06-12 LAB — PROTIME-INR
INR: 1.22
Prothrombin Time: 15.3 seconds — ABNORMAL HIGH (ref 11.4–15.2)

## 2017-06-12 MED ORDER — POLYETHYLENE GLYCOL 3350 17 G PO PACK
17.0000 g | PACK | Freq: Every day | ORAL | Status: DC
  Administered 2017-06-14 – 2017-06-16 (×2): 17 g via ORAL
  Filled 2017-06-12 (×2): qty 1

## 2017-06-12 MED ORDER — VITAMIN D 1000 UNITS PO TABS
2000.0000 [IU] | ORAL_TABLET | Freq: Every day | ORAL | Status: DC
  Administered 2017-06-14 – 2017-06-16 (×3): 2000 [IU] via ORAL
  Filled 2017-06-12 (×3): qty 2

## 2017-06-12 MED ORDER — CARBIDOPA-LEVODOPA 25-100 MG PO TABS
1.0000 | ORAL_TABLET | Freq: Three times a day (TID) | ORAL | Status: DC
  Administered 2017-06-12 – 2017-06-16 (×11): 1 via ORAL
  Filled 2017-06-12 (×15): qty 1

## 2017-06-12 MED ORDER — ONDANSETRON HCL 4 MG/2ML IJ SOLN: 4.0000 mg | Freq: Four times a day (QID) | INTRAMUSCULAR | Status: DC | PRN

## 2017-06-12 MED ORDER — MORPHINE SULFATE (PF) 2 MG/ML IV SOLN
2.0000 mg | INTRAVENOUS | Status: DC | PRN
  Administered 2017-06-12 – 2017-06-14 (×4): 2 mg via INTRAVENOUS
  Filled 2017-06-12 (×4): qty 1

## 2017-06-12 MED ORDER — SODIUM CHLORIDE 0.9 % IV SOLN
INTRAVENOUS | Status: DC
  Administered 2017-06-12: 75 mL/h via INTRAVENOUS
  Administered 2017-06-13: 11:00:00 via INTRAVENOUS

## 2017-06-12 MED ORDER — ACETAMINOPHEN 650 MG RE SUPP: 650.0000 mg | Freq: Four times a day (QID) | RECTAL | Status: DC | PRN

## 2017-06-12 MED ORDER — FENTANYL CITRATE (PF) 100 MCG/2ML IJ SOLN
50.0000 ug | Freq: Once | INTRAMUSCULAR | Status: AC
  Administered 2017-06-12: 50 ug via INTRAVENOUS

## 2017-06-12 MED ORDER — AMLODIPINE BESYLATE 5 MG PO TABS
5.0000 mg | ORAL_TABLET | Freq: Every day | ORAL | Status: DC
  Administered 2017-06-14 – 2017-06-16 (×3): 5 mg via ORAL
  Filled 2017-06-12 (×3): qty 1

## 2017-06-12 MED ORDER — FENTANYL CITRATE (PF) 100 MCG/2ML IJ SOLN
INTRAMUSCULAR | Status: AC
  Administered 2017-06-12: 50 ug via INTRAVENOUS
  Filled 2017-06-12: qty 2

## 2017-06-12 MED ORDER — FENTANYL CITRATE (PF) 100 MCG/2ML IJ SOLN
50.0000 ug | Freq: Once | INTRAMUSCULAR | Status: AC
  Administered 2017-06-12: 50 ug via INTRAVENOUS
  Filled 2017-06-12: qty 2

## 2017-06-12 MED ORDER — HYDROCODONE-ACETAMINOPHEN 5-325 MG PO TABS
1.0000 | ORAL_TABLET | ORAL | Status: DC | PRN
  Administered 2017-06-12: 2 via ORAL
  Filled 2017-06-12: qty 2

## 2017-06-12 MED ORDER — ACETAMINOPHEN 325 MG PO TABS: 650.0000 mg | ORAL_TABLET | Freq: Four times a day (QID) | ORAL | Status: DC | PRN

## 2017-06-12 MED ORDER — HYDROMORPHONE HCL 1 MG/ML IJ SOLN
1.0000 mg | Freq: Once | INTRAMUSCULAR | Status: AC
  Administered 2017-06-12: 1 mg via INTRAVENOUS
  Filled 2017-06-12: qty 1

## 2017-06-12 MED ORDER — ONDANSETRON HCL 4 MG PO TABS: 4.0000 mg | ORAL_TABLET | Freq: Four times a day (QID) | ORAL | Status: DC | PRN

## 2017-06-12 MED ORDER — LEVOTHYROXINE SODIUM 112 MCG PO TABS
112.0000 ug | ORAL_TABLET | Freq: Every day | ORAL | Status: DC
  Administered 2017-06-13 – 2017-06-16 (×4): 112 ug via ORAL
  Filled 2017-06-12 (×5): qty 1

## 2017-06-12 MED ORDER — SENNOSIDES-DOCUSATE SODIUM 8.6-50 MG PO TABS
1.0000 | ORAL_TABLET | Freq: Every evening | ORAL | Status: DC | PRN
  Administered 2017-06-14: 1 via ORAL
  Filled 2017-06-12: qty 1

## 2017-06-12 MED ORDER — VITAMIN B-12 1000 MCG PO TABS
1000.0000 ug | ORAL_TABLET | Freq: Every day | ORAL | Status: DC
  Administered 2017-06-14 – 2017-06-16 (×3): 1000 ug via ORAL
  Filled 2017-06-12 (×3): qty 1

## 2017-06-12 NOTE — Progress Notes (Signed)
Advanced care plan.  Purpose of the Encounter: CODE STATUS  Parties in Attendance: patient and family  Patient's Decision Capacity:Good  Subjective/Patient's story: Accidentally fell and presented to emergency room   Objective/Medical story Has a right femoral neck fracture  has hip pain  Goals of care determination:  Advance directives discussed Patient has living will does not want any cardiac resuscitation, intubation and ventilator if need arises goals met  CODE STATUS: DNR  Time spent discussing advanced care planning: 16 minutes

## 2017-06-12 NOTE — H&P (Signed)
[facility] NAME: Walter [name]    MR#:  [id number]  DATE OF BIRTH:  [birth year]  DATE OF ADMISSION:  06/12/2017  PRIMARY CARE PHYSICIAN: Venia Carbon, MD   REQUESTING/REFERRING PHYSICIAN:   CHIEF COMPLAINT:   Chief Complaint  Patient presents with  . Hip Pain    HISTORY OF PRESENT ILLNESS: Walter [name]  is a 84 y.o. male with a known history of prostate hypertrophy, DVT of lower extremity on oral Eliquis for anticoagulation, hypertension, hypothyroidism, Parkinson's disease is a resident of [facility] facility patient lost balance and fell on the sidewalk.Marland Kitchen He landed on his right hip and has an abrasion injury over the right wrist area.  Imaging studies in the emergency room showed right femoral neck fracture.  Case was discussed with orthopedic attending Dr. Rudene Christians by ER physician.  Patient will have open reduction internal fixation surgery tomorrow to fix the right femoral neck fracture.  Hospitalist service was consulted by ER physician for further care.  Has aching pain in the right hip which is 8 out of 10 on a scale of 1-10.  Pain medication relieves the pain.  PAST MEDICAL HISTORY:   Past Medical History:  Diagnosis Date  . ARF (acute renal failure) ([place]) 2011   after TKR  . BPH (benign prostatic hypertrophy)   . DVT (deep venous thrombosis) ([place]) 1998   after left knee arthroscopy  . Hx of colonoscopy 2000  . Hypertension   . Hypothyroid 2007   postop from thyroidectomy (cancer scare)  . Kidney stones    recurrent  . Oral cancer ([place]) 2004   graft from left thigh  . Osteoarthritis of both knees   . Parkinson's disease ([place])   . Tendonitis 2012   from quinolone  . Toe amputation status ([place]) 2010    PAST SURGICAL HISTORY:  Past Surgical History:  Procedure Laterality Date  . APPENDECTOMY    . CHOLECYSTECTOMY    . HERNIA REPAIR    . LITHOTRIPSY  2009/2010   then stent and cystoscopic removal 2014  .  Oral cancer removed Right 2004  . REPAIR ZENKER'S DIVERTICULA  2013  . THYROIDECTOMY  2007  . TOE AMPUTATION Right 2010   badly overlapping other toe  . TOTAL KNEE ARTHROPLASTY Left   . TRANSURETHRAL RESECTION OF PROSTATE  2011  . UMBILICAL HERNIA REPAIR  2007    SOCIAL HISTORY:  Social History   Tobacco Use  . Smoking status: Former Smoker    Types: Cigarettes    Last attempt to quit: 04/27/2002    Years since quitting: 15.1  . Smokeless tobacco: Never Used  Substance Use Topics  . Alcohol use: Yes    Alcohol/week: 0.0 oz    Comment: one drink daily (wine or scotch)    FAMILY HISTORY:  Family History  Problem Relation Age of Onset  . Dementia Mother   . Stroke Father   . Pulmonary disease Brother   . Heart disease Neg Hx   . Diabetes Neg Hx     DRUG ALLERGIES:  Allergies  Allergen Reactions  . Quinolones     tendonitis    REVIEW OF SYSTEMS:   CONSTITUTIONAL: No fever, fatigue or weakness.  EYES: No blurred or double vision.  EARS, NOSE, AND THROAT: No tinnitus or ear pain.  RESPIRATORY: No cough, shortness of breath, wheezing or hemoptysis.  CARDIOVASCULAR: No chest pain, orthopnea, edema.  GASTROINTESTINAL: No nausea, vomiting, diarrhea or  abdominal pain.  GENITOURINARY: No dysuria, hematuria.  ENDOCRINE: No polyuria, nocturia,  HEMATOLOGY: No anemia, easy bruising or bleeding SKIN: abrasion over right wrist MUSCULOSKELETAL: Right hip pain NEUROLOGIC: No tingling, numbness, weakness.  PSYCHIATRY: No anxiety or depression.   MEDICATIONS AT HOME:  Prior to Admission medications   Medication Sig Start Date End Date Taking? Authorizing Provider  amLODipine (NORVASC) 5 MG tablet TAKE 1 TABLET EVERY DAY 12/13/16  Yes Viviana Simpler I, MD  carbidopa-levodopa (SINEMET IR) 25-100 MG tablet TAKE 1 TABLET THREE TIMES DAILY 12/13/16  Yes Venia Carbon, MD  Cholecalciferol (VITAMIN D) 2000 UNITS CAPS Take 2,000 Units by mouth daily.    Yes [provider]  ELIQUIS 5 MG TABS tablet TAKE 1 TABLET TWICE DAILY 04/12/17  Yes Corcoran, Melissa C, MD  levothyroxine (SYNTHROID, LEVOTHROID) 112 MCG tablet TAKE 1 TABLET EVERY DAY BEFORE BREAKFAST 12/13/16  Yes Viviana Simpler I, MD  polyethylene glycol (MIRALAX / GLYCOLAX) packet Take 17 g by mouth daily.    Yes [provider]  vitamin B-12 (CYANOCOBALAMIN) 1000 MCG tablet Take 1,000 mcg by mouth daily.   Yes [provider]      PHYSICAL EXAMINATION:   VITAL SIGNS: Blood pressure (!) 156/76, pulse 76, temperature (!) 97.3 F (36.3 C), temperature source Oral, resp. rate 20, height 5\' 10"  (1.778 m), weight 79.4 kg (175 lb), SpO2 96 %.  GENERAL:  84 y.o.-year-old patient lying in the bed with no acute distress.  EYES: Pupils equal, round, reactive to light and accommodation. No scleral icterus. Extraocular muscles intact.  HEENT: Head atraumatic, normocephalic. Oropharynx and nasopharynx clear.  NECK:  Supple, no jugular venous distention. No thyroid enlargement, no tenderness.  LUNGS: Normal breath sounds bilaterally, no wheezing, rales,rhonchi or crepitation. No use of accessory muscles of respiration.  CARDIOVASCULAR: S1, S2 normal. No murmurs, rubs, or gallops.  ABDOMEN: Soft, nontender, nondistended. Bowel sounds present. No organomegaly or mass.  EXTREMITIES: No pedal edema, cyanosis, or clubbing.  Has right hip tenderness NEUROLOGIC: Cranial nerves II through XII are intact. Muscle strength 5/5 in all extremities. Sensation intact. Gait not checked.  PSYCHIATRIC: The patient is alert and oriented x 3.  SKIN: Abrasion over right wrist and fore arm  LABORATORY PANEL:   CBC Recent Labs  Lab 06/12/17 1639  WBC 6.2  HGB 14.0  HCT 41.3  PLT 238  MCV 91.5  MCH 30.9  MCHC 33.8  RDW 13.8  LYMPHSABS 0.9*  MONOABS 0.5  EOSABS 0.1  BASOSABS 0.0    ------------------------------------------------------------------------------------------------------------------  Chemistries  Recent Labs  Lab 06/12/17 1639  NA 141  K 3.8  CL 104  CO2 31  GLUCOSE 105*  BUN 27*  CREATININE 1.65*  CALCIUM 9.0   ------------------------------------------------------------------------------------------------------------------ estimated creatinine clearance is 34.4 mL/min (A) (by C-G formula based on SCr of 1.65 mg/dL (H)). ------------------------------------------------------------------------------------------------------------------ No results for input(s): TSH, T4TOTAL, T3FREE, THYROIDAB in the last 72 hours.  Invalid input(s): FREET3   Coagulation profile Recent Labs  Lab 06/12/17 1639  INR 1.22   ------------------------------------------------------------------------------------------------------------------- No results for input(s): DDIMER in the last 72 hours. -------------------------------------------------------------------------------------------------------------------  Cardiac Enzymes No results for input(s): CKMB, TROPONINI, MYOGLOBIN in the last 168 hours.  Invalid input(s): CK ------------------------------------------------------------------------------------------------------------------ Invalid input(s): POCBNP  ---------------------------------------------------------------------------------------------------------------  Urinalysis    Component Value Date/Time   COLORURINE YELLOW (A) 03/11/2016 1029   APPEARANCEUR Cloudy (A) 04/28/2016 1523   LABSPEC 1.013 03/11/2016 1029   PHURINE 6.0 03/11/2016 1029   GLUCOSEU Negative 04/28/2016 1523  HGBUR LARGE (A) 03/11/2016 1029   BILIRUBINUR Negative 04/28/2016 1523   KETONESUR 5 (A) 03/11/2016 1029   PROTEINUR 2+ (A) 04/28/2016 1523   PROTEINUR 30 (A) 03/11/2016 1029   UROBILINOGEN negative 02/17/2014 1643   NITRITE Negative 04/28/2016 1523   NITRITE  NEGATIVE 03/11/2016 1029   LEUKOCYTESUR Negative 04/28/2016 1523     RADIOLOGY: Dg Chest 1 View  Result Date: 06/12/2017 CLINICAL DATA:  Right hip fracture EXAM: CHEST  1 VIEW COMPARISON:  03/03/2016 FINDINGS: Lungs are hyperexpanded. The lungs are clear without focal pneumonia, edema, pneumothorax or pleural effusion. Interstitial markings are diffusely coarsened with chronic features. Prominent skin fold noted over the lateral left hemithorax. Cardiopericardial silhouette is at upper limits of normal for size. The visualized bony structures of the thorax are intact. IMPRESSION: No active disease. Electronically Signed   By: Misty Keelan M.D.   On: 06/12/2017 17:25   Ct Hip Right Wo Contrast  Result Date: 06/12/2017 CLINICAL DATA:  Right hip fracture secondary to a fall today. EXAM: CT OF THE RIGHT HIP WITHOUT CONTRAST TECHNIQUE: Multidetector CT imaging of the right hip was performed according to the standard protocol. Multiplanar CT image reconstructions were also generated. COMPARISON:  Radiographs dated 06/12/2017 FINDINGS: Bones/Joint/Cartilage There is an oblique intertrochanteric fracture of the proximal right femur. Fracture extends from the lateral aspect of the subcapital region of the right femoral neck inferiorly through the lesser trochanter with slight impaction. The fracture extends approximately 4 cm distal to the lesser trochanter medially. The visualized pelvic bones are normal except for mild to moderate arthritic changes of the right hip joint with marginal osteophytes and slight joint space narrowing. Muscles and Tendons Negative. Soft tissues Focal soft tissue contusion in the subcutaneous fat posterolateral aspect of the left hip. IMPRESSION: Acute fracture of the right femoral neck extending into the intertrochanteric region and lesser trochanter and extending medially into the proximal left femoral shaft. Electronically Signed   By: Lorriane Shire M.D.   On: 06/12/2017 17:31    Dg Hip Unilat W Or Wo Pelvis 2-3 Views Right  Result Date: 06/12/2017 CLINICAL DATA:  Right hip pain.  Fall today. EXAM: DG HIP (WITH OR WITHOUT PELVIS) 2-3V RIGHT COMPARISON:  None. FINDINGS: A nondisplaced intertrochanteric fracture is present. The hip is not well visualized on the lateral view. Pelvis is otherwise intact. Left hip is unremarkable. Vascular calcifications are present. IMPRESSION: Nondisplaced right intertrochanteric fracture. Detail is limited. CT could be used for or further detail if clinically indicated. Electronically Signed   By: San Morelle M.D.   On: 06/12/2017 16:35    EKG: Orders placed or performed during the hospital encounter of 06/12/17  . ED EKG  . ED EKG    IMPRESSION AND PLAN:  84 year old elderly male patient with history of DVT on oral Eliquis, Parkinson's disease, prostate hypertrophy, hypertension, osteoarthritis of the knees had a fall.  1.Right femoral neck fracture Admit patient to medical floor Orthopedic surgery consultation N.p.o. after midnight  2.Right hip pain Pain management with IV morphine as needed and oral Percocet  3.Accidental fall  4.Parkinson's disease  continue carbidopa levodopa  5.Hypertension  continue oral Norvasc  6.DVT prophylaxis sequential compression device to lower extremities     All the records are reviewed and case discussed with ED provider. Management plans discussed with the patient, family and they are in agreement.  CODE STATUS:DNR    Code Status Orders  (From admission, onward)        Start  Ordered   06/12/17 2001  Do not attempt resuscitation (DNR)  Continuous    Question Answer Comment  In the event of cardiac or respiratory ARREST Do not call a "code blue"   In the event of cardiac or respiratory ARREST Do not perform Intubation, CPR, defibrillation or ACLS   In the event of cardiac or respiratory ARREST Use medication by any route, position, wound care, and other  measures to relive pain and suffering. May use oxygen, suction and manual treatment of airway obstruction as needed for comfort.      06/12/17 2000    Code Status History    Date Active Date Inactive Code Status Order ID Comments User Context   03/03/2016 2223 03/05/2016 1837 Full Code [id number]  Lance Coon, MD Inpatient       TOTAL TIME TAKING CARE OF THIS PATIENT: 52 minutes.    Saundra Shelling M.D on 06/12/2017 at 8:00 PM  Between 7am to 6pm - Pager - [phone]  After 6pm go to [email or web address] - password [email or web address] [facility]  Office  [phone]  CC: Primary care physician; Venia Carbon, MD

## 2017-06-12 NOTE — ED Provider Notes (Signed)
[facility] Emergency Department Provider Note  ____________________________________________  Time seen: Approximately 4:13 PM  I have reviewed the triage vital signs and the nursing notes.   HISTORY  Chief Complaint Hip Pain    HPI Tiron Suski is a 84 y.o. male with a history of hypertension, kidney stones, pulmonary embolism who comes to the ED complaining of right hip pain after a fall. He was returning home, states that he had a trip and fall, tried to catch himself with his right arm which protected him from hitting his head. He is on Eliquis. Complains of pain in the right hip which is severe, 8/10, sudden onset, constant, nonradiating. Hurts with movement of the leg.      Past Medical History:  Diagnosis Date  . ARF (acute renal failure) ([place]) 2011   after TKR  . BPH (benign prostatic hypertrophy)   . DVT (deep venous thrombosis) ([place]) 1998   after left knee arthroscopy  . Hx of colonoscopy 2000  . Hypertension   . Hypothyroid 2007   postop from thyroidectomy (cancer scare)  . Kidney stones    recurrent  . Oral cancer ([place]) 2004   graft from left thigh  . Osteoarthritis of both knees   . Parkinson's disease ([place])   . Tendonitis 2012   from quinolone  . Toe amputation status ([place]) 2010     Patient Active Problem List   Diagnosis Date Noted  . Advance directive discussed with patient 11/01/2016  . Renal cyst, right 06/06/2016  . Acute deep vein thrombosis (DVT) of popliteal vein of right lower extremity ([place]) 04/28/2016  . Benign essential HTN 04/28/2016  . CKD (chronic kidney disease) stage 3, GFR 30-59 ml/min (HCC) 03/05/2016  . Acute saddle pulmonary embolism without acute cor pulmonale (HCC) 03/03/2016  . Vitamin B12 deficiency 09/09/2014  . Preventative health care 08/04/2014  . Parkinson's disease ([place]) 08/04/2014  . Esophageal diverticulum 01/01/2014  . Actinic keratosis 07/31/2013  . Hypertension   . Osteoarthritis of both  knees   . Toe amputation status ([place])   . Benign prostatic hyperplasia   . Kidney stones   . Hypothyroid   . Tendonitis      Past Surgical History:  Procedure Laterality Date  . APPENDECTOMY    . CHOLECYSTECTOMY    . HERNIA REPAIR    . LITHOTRIPSY  2009/2010   then stent and cystoscopic removal 2014  . Oral cancer removed Right 2004  . REPAIR ZENKER'S DIVERTICULA  2013  . THYROIDECTOMY  2007  . TOE AMPUTATION Right 2010   badly overlapping other toe  . TOTAL KNEE ARTHROPLASTY Left   . TRANSURETHRAL RESECTION OF PROSTATE  2011  . UMBILICAL HERNIA REPAIR  2007     Prior to Admission medications   Medication Sig Start Date End Date Taking? Authorizing Provider  amLODipine (NORVASC) 5 MG tablet TAKE 1 TABLET EVERY DAY 12/13/16   Viviana Simpler I, MD  carbidopa-levodopa (SINEMET IR) 25-100 MG tablet TAKE 1 TABLET THREE TIMES DAILY 12/13/16   Venia Carbon, MD  Cholecalciferol (VITAMIN D) 2000 UNITS CAPS Take by mouth daily.    [provider]  ELIQUIS 5 MG TABS tablet TAKE 1 TABLET TWICE DAILY 04/12/17   Lequita Asal, MD  levothyroxine (SYNTHROID, LEVOTHROID) 112 MCG tablet TAKE 1 TABLET EVERY DAY BEFORE BREAKFAST 12/13/16   Viviana Simpler I, MD  polyethylene glycol (MIRALAX / GLYCOLAX) packet Take 17 g by mouth daily as needed.     [provider]  vitamin B-12 (CYANOCOBALAMIN) 1000 MCG tablet Take 1,000 mcg by mouth daily.    [provider]     Allergies Quinolones   Family History  Problem Relation Age of Onset  . Dementia Mother   . Stroke Father   . Pulmonary disease Brother   . Heart disease Neg Hx   . Diabetes Neg Hx     Social History Social History   Tobacco Use  . Smoking status: Former Smoker    Types: Cigarettes    Last attempt to quit: 04/27/2002    Years since quitting: 15.1  . Smokeless tobacco: Never Used  Substance Use Topics  . Alcohol use: Yes    Alcohol/week: 0.0 oz    Comment: one drink daily (wine  or scotch)  . Drug use: No    Review of Systems  Constitutional:   No fever or chills.  ENT:   No sore throat. No rhinorrhea. Cardiovascular:   No chest pain or syncope. Respiratory:   No dyspnea or cough. Gastrointestinal:   Negative for abdominal pain, vomiting and diarrhea.  Musculoskeletal:   positive right hip pain as above All other systems reviewed and are negative except as documented above in ROS and HPI.  ____________________________________________   PHYSICAL EXAM:  VITAL SIGNS: ED Triage Vitals  Enc Vitals Group     BP 06/12/17 1600 (!) 158/78     Pulse Rate 06/12/17 1600 77     Resp 06/12/17 1600 20     Temp 06/12/17 1600 (!) 97.3 F (36.3 C)     Temp Source 06/12/17 1600 Oral     SpO2 06/12/17 1600 96 %     Weight 06/12/17 1602 175 lb (79.4 kg)     Height 06/12/17 1602 5\' 10"  (1.778 m)     Head Circumference --      Peak Flow --      Pain Score 06/12/17 1601 7     Pain Loc --      Pain Edu? --      Excl. in [place]? --     Vital signs reviewed, nursing assessments reviewed.   Constitutional:   Alert and oriented. Well appearing and in no distress. Eyes:   Conjunctivae are normal. EOMI. PERRL. ENT      Head:   Normocephalic and atraumatic.      Nose:   No congestion/rhinnorhea.       Mouth/Throat:   MMM, no pharyngeal erythema. No peritonsillar mass.       Neck:   No meningismus. Full ROM. Hematological/Lymphatic/Immunilogical:   No cervical lymphadenopathy. Cardiovascular:   regular rate, irregular rhythm.. Symmetric bilateral radial and DP pulses.  No murmurs.  Respiratory:   Normal respiratory effort without tachypnea/retractions. Breath sounds are clear and equal bilaterally. No wheezes/rales/rhonchi. Gastrointestinal:   Soft and nontender. Non distended. There is no CVA tenderness.  No rebound, rigidity, or guarding. Genitourinary:   deferred Musculoskeletal:   tenderness to palpation at the right femoral head and acetabular pelvis. Long bones  otherwise stable and nontender. Right knee unaffected.. Neurologic:   Normal speech and language.  Motor grossly intact. No acute focal neurologic deficits are appreciated.  Skin:    Skin is warm, dry with a 2-3 cm superficial skin tear over the right dorsal forearm, no laceration. Hemostatic. No rash ____________________________________________    LABS (pertinent positives/negatives) (all labs ordered are listed, but only abnormal results are displayed) Labs Reviewed  BASIC METABOLIC PANEL - Abnormal; Notable for the following  components:      Result Value   Glucose, Bld 105 (*)    BUN 27 (*)    Creatinine, Ser 1.65 (*)    GFR calc non Af Amer 37 (*)    GFR calc Af Amer 42 (*)    All other components within normal limits  PROTIME-INR - Abnormal; Notable for the following components:   Prothrombin Time 15.3 (*)    All other components within normal limits  CBC WITH DIFFERENTIAL/PLATELET - Abnormal; Notable for the following components:   Lymphs Abs 0.9 (*)    All other components within normal limits   ____________________________________________   EKG  interpreted by me Sinus rhythm rate of 75, normal axis. First-degree AV block. Normal QRS ST segments. Isolated T-wave inversion in aVL which is nonspecific.  ____________________________________________    RADIOLOGY  Dg Chest 1 View  Result Date: 06/12/2017 CLINICAL DATA:  Right hip fracture EXAM: CHEST  1 VIEW COMPARISON:  03/03/2016 FINDINGS: Lungs are hyperexpanded. The lungs are clear without focal pneumonia, edema, pneumothorax or pleural effusion. Interstitial markings are diffusely coarsened with chronic features. Prominent skin fold noted over the lateral left hemithorax. Cardiopericardial silhouette is at upper limits of normal for size. The visualized bony structures of the thorax are intact. IMPRESSION: No active disease. Electronically Signed   By: Misty Ervan M.D.   On: 06/12/2017 17:25   Ct Hip Right Wo  Contrast  Result Date: 06/12/2017 CLINICAL DATA:  Right hip fracture secondary to a fall today. EXAM: CT OF THE RIGHT HIP WITHOUT CONTRAST TECHNIQUE: Multidetector CT imaging of the right hip was performed according to the standard protocol. Multiplanar CT image reconstructions were also generated. COMPARISON:  Radiographs dated 06/12/2017 FINDINGS: Bones/Joint/Cartilage There is an oblique intertrochanteric fracture of the proximal right femur. Fracture extends from the lateral aspect of the subcapital region of the right femoral neck inferiorly through the lesser trochanter with slight impaction. The fracture extends approximately 4 cm distal to the lesser trochanter medially. The visualized pelvic bones are normal except for mild to moderate arthritic changes of the right hip joint with marginal osteophytes and slight joint space narrowing. Muscles and Tendons Negative. Soft tissues Focal soft tissue contusion in the subcutaneous fat posterolateral aspect of the left hip. IMPRESSION: Acute fracture of the right femoral neck extending into the intertrochanteric region and lesser trochanter and extending medially into the proximal left femoral shaft. Electronically Signed   By: Lorriane Shire M.D.   On: 06/12/2017 17:31   Dg Hip Unilat W Or Wo Pelvis 2-3 Views Right  Result Date: 06/12/2017 CLINICAL DATA:  Right hip pain.  Fall today. EXAM: DG HIP (WITH OR WITHOUT PELVIS) 2-3V RIGHT COMPARISON:  None. FINDINGS: A nondisplaced intertrochanteric fracture is present. The hip is not well visualized on the lateral view. Pelvis is otherwise intact. Left hip is unremarkable. Vascular calcifications are present. IMPRESSION: Nondisplaced right intertrochanteric fracture. Detail is limited. CT could be used for or further detail if clinically indicated. Electronically Signed   By: San Morelle M.D.   On: 06/12/2017 16:35     ____________________________________________   PROCEDURES Procedures  ____________________________________________  DIFFERENTIAL DIAGNOSIS   right hip fracture, pelvis fracture, contusion  CLINICAL IMPRESSION / ASSESSMENT AND PLAN / ED COURSE  Pertinent labs & imaging results that were available during my care of the patient were reviewed by me and considered in my medical decision making (see chart for details).      Clinical Course as of Jun 12 1848  Mon Jun 12, 2017  1606 P/w right hip pain at acetab. After fall. Will obtain Xrays, labs. Give IV fentanyl for pain control.    [PS]  1656 Hip xray suggests nondisplaced right intertrochanteric fx. When I look at the images, it appears there is a loss of femoral neck length, suggesting worse injury than reported. Will get CT hip to further evaluate.    [PS]  0349 Ct confirms oblique intertroch. Fx. Will d/w ortho. Likely admit for surgical management and anticoagulation management.    [PS]  1806 D/w ortho menz. Could operate tomorrow to repair. Will admit to medicine.    [PS]    Clinical Course User Index [PS] Carrie Mew, MD     ----------------------------------------- 6:50 PM on 06/12/2017 -----------------------------------------  Patient requiring repeated opioid dosing to control his hip pain. Switched to IV Dilaudid for longer effect.  ____________________________________________   FINAL CLINICAL IMPRESSION(S) / ED DIAGNOSES    Final diagnoses:  Closed 2-part intertrochanteric fracture of right femur, initial encounter ([place])  Current use of long term anticoagulation     ED Discharge Orders    None      Portions of this note were generated with dragon dictation software. Dictation errors may occur despite best attempts at proofreading.    Carrie Mew, MD 06/12/17 [phone]

## 2017-06-12 NOTE — ED Triage Notes (Signed)
approx 30 min prior to arrival fell. Patient states he lost his balance. History of Parkinsons. Alert and oriented on arrival. Takes Eloquis due to PE.

## 2017-06-12 NOTE — ED Notes (Signed)
Pt resting with eyes closed. VSS. Family at bedside.

## 2017-06-13 ENCOUNTER — Inpatient Hospital Stay: Payer: Medicare HMO | Admitting: Anesthesiology

## 2017-06-13 ENCOUNTER — Encounter
Admission: EM | Disposition: A | Discharge status: Skilled Nursing Facility | Payer: Self-pay | Source: Home / Self Care | Attending: Internal Medicine

## 2017-06-13 ENCOUNTER — Inpatient Hospital Stay: Payer: Medicare HMO

## 2017-06-13 ENCOUNTER — Encounter: Payer: Self-pay | Admitting: Anesthesiology

## 2017-06-13 ENCOUNTER — Other Ambulatory Visit: Payer: Self-pay

## 2017-06-13 HISTORY — PX: INTRAMEDULLARY (IM) NAIL INTERTROCHANTERIC: SHX5875

## 2017-06-13 LAB — BASIC METABOLIC PANEL
Anion gap: 4 — ABNORMAL LOW (ref 5–15)
BUN: 27 mg/dL — ABNORMAL HIGH (ref 6–20)
CHLORIDE: 106 mmol/L (ref 101–111)
CO2: 27 mmol/L (ref 22–32)
CREATININE: 1.43 mg/dL — AB (ref 0.61–1.24)
Calcium: 8.5 mg/dL — ABNORMAL LOW (ref 8.9–10.3)
GFR calc non Af Amer: 43 mL/min — ABNORMAL LOW (ref >60)
GFR, EST AFRICAN AMERICAN: 50 mL/min — AB (ref >60)
Glucose, Bld: 109 mg/dL — ABNORMAL HIGH (ref 65–99)
POTASSIUM: 3.5 mmol/L (ref 3.5–5.1)
Sodium: 137 mmol/L (ref 135–145)

## 2017-06-13 LAB — CBC
HCT: 37.9 % — ABNORMAL LOW (ref 40.0–52.0)
Hemoglobin: 13 g/dL (ref 13.0–18.0)
MCH: 31.4 pg (ref 26.0–34.0)
MCHC: 34.3 g/dL (ref 32.0–36.0)
MCV: 91.4 fL (ref 80.0–100.0)
PLATELETS: 208 10*3/uL (ref 150–440)
RBC: 4.15 MIL/uL — AB (ref 4.40–5.90)
RDW: 13.8 % (ref 11.5–14.5)
WBC: 8.1 10*3/uL (ref 3.8–10.6)

## 2017-06-13 LAB — PROTIME-INR
INR: 1.28
Prothrombin Time: 15.9 seconds — ABNORMAL HIGH (ref 11.4–15.2)

## 2017-06-13 LAB — SURGICAL PCR SCREEN
MRSA, PCR: NEGATIVE
STAPHYLOCOCCUS AUREUS: NEGATIVE

## 2017-06-13 SURGERY — FIXATION, FRACTURE, INTERTROCHANTERIC, WITH INTRAMEDULLARY ROD
Anesthesia: General | Site: Hip | Laterality: Right | Wound class: Contaminated

## 2017-06-13 MED ORDER — MAGNESIUM HYDROXIDE 400 MG/5ML PO SUSP
30.0000 mL | Freq: Every day | ORAL | Status: DC | PRN
  Administered 2017-06-14: 30 mL via ORAL
  Filled 2017-06-13: qty 30

## 2017-06-13 MED ORDER — CEFAZOLIN SODIUM 1 G IJ SOLR
INTRAMUSCULAR | Status: AC
  Filled 2017-06-13: qty 10

## 2017-06-13 MED ORDER — ALUM & MAG HYDROXIDE-SIMETH 200-200-20 MG/5ML PO SUSP: 30.0000 mL | ORAL | Status: DC | PRN

## 2017-06-13 MED ORDER — MENTHOL 3 MG MT LOZG
1.0000 | LOZENGE | OROMUCOSAL | Status: DC | PRN
  Filled 2017-06-13: qty 9

## 2017-06-13 MED ORDER — FUROSEMIDE 10 MG/ML IJ SOLN
INTRAMUSCULAR | Status: AC
  Administered 2017-06-13: 10 mg via INTRAVENOUS
  Filled 2017-06-13: qty 2

## 2017-06-13 MED ORDER — APIXABAN 5 MG PO TABS: 5.0000 mg | ORAL_TABLET | Freq: Two times a day (BID) | ORAL | Status: DC

## 2017-06-13 MED ORDER — APIXABAN 2.5 MG PO TABS
2.5000 mg | ORAL_TABLET | Freq: Two times a day (BID) | ORAL | Status: DC
  Administered 2017-06-14: 2.5 mg via ORAL
  Filled 2017-06-13: qty 1

## 2017-06-13 MED ORDER — LACTATED RINGERS IV SOLN
INTRAVENOUS | Status: DC | PRN
  Administered 2017-06-13: 17:00:00 via INTRAVENOUS

## 2017-06-13 MED ORDER — MAGNESIUM CITRATE PO SOLN
1.0000 | Freq: Once | ORAL | Status: DC | PRN
  Filled 2017-06-13: qty 296

## 2017-06-13 MED ORDER — FENTANYL CITRATE (PF) 100 MCG/2ML IJ SOLN: 25.0000 ug | INTRAMUSCULAR | Status: DC | PRN

## 2017-06-13 MED ORDER — METOCLOPRAMIDE HCL 10 MG PO TABS: 5.0000 mg | ORAL_TABLET | Freq: Three times a day (TID) | ORAL | Status: DC | PRN

## 2017-06-13 MED ORDER — FENTANYL CITRATE (PF) 100 MCG/2ML IJ SOLN
INTRAMUSCULAR | Status: AC
  Filled 2017-06-13: qty 2

## 2017-06-13 MED ORDER — IPRATROPIUM-ALBUTEROL 0.5-2.5 (3) MG/3ML IN SOLN: 3.0000 mL | Freq: Once | RESPIRATORY_TRACT | Status: DC

## 2017-06-13 MED ORDER — DEXAMETHASONE SODIUM PHOSPHATE 10 MG/ML IJ SOLN
INTRAMUSCULAR | Status: DC | PRN
  Administered 2017-06-13: 5 mg via INTRAVENOUS

## 2017-06-13 MED ORDER — SODIUM CHLORIDE FLUSH 0.9 % IV SOLN
INTRAVENOUS | Status: AC
  Filled 2017-06-13: qty 10

## 2017-06-13 MED ORDER — PROPOFOL 10 MG/ML IV BOLUS
INTRAVENOUS | Status: AC
  Filled 2017-06-13: qty 20

## 2017-06-13 MED ORDER — PHENOL 1.4 % MT LIQD
1.0000 | OROMUCOSAL | Status: DC | PRN
  Filled 2017-06-13: qty 177

## 2017-06-13 MED ORDER — SODIUM CHLORIDE 0.9 % IV SOLN: INTRAVENOUS | Status: DC

## 2017-06-13 MED ORDER — PROPOFOL 10 MG/ML IV BOLUS
INTRAVENOUS | Status: DC | PRN
  Administered 2017-06-13: 80 mg via INTRAVENOUS

## 2017-06-13 MED ORDER — METHOCARBAMOL 500 MG PO TABS: 500.0000 mg | ORAL_TABLET | Freq: Four times a day (QID) | ORAL | Status: DC | PRN

## 2017-06-13 MED ORDER — SUGAMMADEX SODIUM 200 MG/2ML IV SOLN
INTRAVENOUS | Status: DC | PRN
  Administered 2017-06-13: 160 mg via INTRAVENOUS

## 2017-06-13 MED ORDER — FUROSEMIDE 10 MG/ML IJ SOLN
10.0000 mg | Freq: Once | INTRAMUSCULAR | Status: AC
  Administered 2017-06-13: 10 mg via INTRAVENOUS

## 2017-06-13 MED ORDER — ONDANSETRON HCL 4 MG/2ML IJ SOLN: 4.0000 mg | Freq: Once | INTRAMUSCULAR | Status: DC | PRN

## 2017-06-13 MED ORDER — METOCLOPRAMIDE HCL 5 MG/ML IJ SOLN: 5.0000 mg | Freq: Three times a day (TID) | INTRAMUSCULAR | Status: DC | PRN

## 2017-06-13 MED ORDER — ONDANSETRON HCL 4 MG/2ML IJ SOLN
INTRAMUSCULAR | Status: DC | PRN
  Administered 2017-06-13: 4 mg via INTRAVENOUS

## 2017-06-13 MED ORDER — IPRATROPIUM-ALBUTEROL 0.5-2.5 (3) MG/3ML IN SOLN
RESPIRATORY_TRACT | Status: AC
  Administered 2017-06-13: 3 mL
  Filled 2017-06-13: qty 3

## 2017-06-13 MED ORDER — CEFAZOLIN (ANCEF) 1 G IV SOLR
2.0000 g | INTRAVENOUS | Status: AC
  Administered 2017-06-13: 2 g
  Filled 2017-06-13: qty 2

## 2017-06-13 MED ORDER — METHOCARBAMOL 1000 MG/10ML IJ SOLN
500.0000 mg | Freq: Four times a day (QID) | INTRAVENOUS | Status: DC | PRN
  Filled 2017-06-13: qty 5

## 2017-06-13 MED ORDER — NEOMYCIN-POLYMYXIN B GU 40-200000 IR SOLN
Status: DC | PRN
  Administered 2017-06-13: 2 mL

## 2017-06-13 MED ORDER — ROCURONIUM BROMIDE 100 MG/10ML IV SOLN
INTRAVENOUS | Status: DC | PRN
  Administered 2017-06-13: 40 mg via INTRAVENOUS

## 2017-06-13 MED ORDER — DOCUSATE SODIUM 100 MG PO CAPS
100.0000 mg | ORAL_CAPSULE | Freq: Two times a day (BID) | ORAL | Status: DC
  Administered 2017-06-13 – 2017-06-16 (×6): 100 mg via ORAL
  Filled 2017-06-13 (×6): qty 1

## 2017-06-13 MED ORDER — LIDOCAINE HCL (CARDIAC) 20 MG/ML IV SOLN
INTRAVENOUS | Status: DC | PRN
  Administered 2017-06-13: 60 mg via INTRAVENOUS

## 2017-06-13 MED ORDER — CEFAZOLIN SODIUM-DEXTROSE 2-4 GM/100ML-% IV SOLN
2.0000 g | Freq: Four times a day (QID) | INTRAVENOUS | Status: AC
  Administered 2017-06-13 – 2017-06-14 (×3): 2 g via INTRAVENOUS
  Filled 2017-06-13 (×3): qty 100

## 2017-06-13 MED ORDER — FENTANYL CITRATE (PF) 100 MCG/2ML IJ SOLN
INTRAMUSCULAR | Status: DC | PRN
  Administered 2017-06-13 (×2): 50 ug via INTRAVENOUS

## 2017-06-13 MED ORDER — BISACODYL 10 MG RE SUPP
10.0000 mg | Freq: Every day | RECTAL | Status: DC | PRN
  Administered 2017-06-15: 10 mg via RECTAL
  Filled 2017-06-13: qty 1

## 2017-06-13 SURGICAL SUPPLY — 33 items
BIT DRILL CANN LG 4.3MM (BIT) ×1 IMPLANT
CANISTER SUCT 1200ML W/VALVE (MISCELLANEOUS) ×3 IMPLANT
CHLORAPREP W/TINT 26ML (MISCELLANEOUS) ×3 IMPLANT
DRAPE SHEET LG 3/4 BI-LAMINATE (DRAPES) ×3 IMPLANT
DRAPE U-SHAPE 47X51 STRL (DRAPES) ×3 IMPLANT
DRILL BIT CANN LG 4.3MM (BIT) ×3
DRSG OPSITE POSTOP 3X4 (GAUZE/BANDAGES/DRESSINGS) ×9 IMPLANT
GAUZE SPONGE 4X4 16PLY XRAY LF (GAUZE/BANDAGES/DRESSINGS) ×3 IMPLANT
GAUZE XEROFORM 4X4 STRL (GAUZE/BANDAGES/DRESSINGS) ×3 IMPLANT
GLOVE BIOGEL PI IND STRL 9 (GLOVE) ×1 IMPLANT
GLOVE BIOGEL PI INDICATOR 9 (GLOVE) ×2
GLOVE SURG SYN 9.0  PF PI (GLOVE) ×2
GLOVE SURG SYN 9.0 PF PI (GLOVE) ×1 IMPLANT
GOWN SRG 2XL LVL 4 RGLN SLV (GOWNS) ×1 IMPLANT
GOWN STRL NON-REIN 2XL LVL4 (GOWNS) ×2
GOWN STRL REUS W/ TWL LRG LVL3 (GOWN DISPOSABLE) ×1 IMPLANT
GOWN STRL REUS W/TWL LRG LVL3 (GOWN DISPOSABLE) ×2
GUIDEPIN VERSANAIL DSP 3.2X444 (ORTHOPEDIC DISPOSABLE SUPPLIES) ×6 IMPLANT
HIP FRAC NAIL LAG SCR 10.5X100 (Orthopedic Implant) ×2 IMPLANT
KIT TURNOVER KIT A (KITS) ×3 IMPLANT
MAT BLUE FLOOR 46X72 FLO (MISCELLANEOUS) ×3 IMPLANT
NAIL HIP FRACT 130D 9X180 (Orthopedic Implant) ×3 IMPLANT
NEEDLE FILTER BLUNT 18X 1/2SAF (NEEDLE) ×2
NEEDLE FILTER BLUNT 18X1 1/2 (NEEDLE) ×1 IMPLANT
NS IRRIG 500ML POUR BTL (IV SOLUTION) ×3 IMPLANT
PACK HIP COMPR (MISCELLANEOUS) ×3 IMPLANT
PAD ABD DERMACEA PRESS 5X9 (GAUZE/BANDAGES/DRESSINGS) ×3 IMPLANT
SCREW BONE CORTICAL 5.0X36 (Screw) ×3 IMPLANT
SCREW CANN THRD AFF 10.5X100 (Orthopedic Implant) ×1 IMPLANT
STAPLER SKIN PROX 35W (STAPLE) ×3 IMPLANT
SUT VIC AB 1 CT1 36 (SUTURE) ×3 IMPLANT
SUT VIC AB 2-0 CT1 (SUTURE) ×3 IMPLANT
SYR 10ML LL (SYRINGE) ×3 IMPLANT

## 2017-06-13 NOTE — Transfer of Care (Signed)
Immediate Anesthesia Transfer of Care Note  Patient: Walter [name]  Procedure(s) Performed: INTRAMEDULLARY (IM) NAIL INTERTROCHANTRIC (Right Hip)  Patient Location: PACU  Anesthesia Type:General  Level of Consciousness: sedated  Airway & Oxygen Therapy: Patient connected to face mask oxygen  Post-op Assessment: Post -op Vital signs reviewed and stable  Post vital signs: stable  Last Vitals:  Vitals Value Taken Time  BP 160/78 06/13/2017  6:42 PM  Temp    Pulse 84 06/13/2017  6:42 PM  Resp 16 06/13/2017  6:42 PM  SpO2 98 % 06/13/2017  6:42 PM  Vitals shown include unvalidated device data.  Last Pain:  Vitals:   06/13/17 1418  TempSrc:   PainSc: 9          Complications: No apparent anesthesia complications

## 2017-06-13 NOTE — Anesthesia Procedure Notes (Signed)
Procedure Name: Intubation Date/Time: 06/13/2017 5:40 PM Performed by: Allean Found, CRNA Pre-anesthesia Checklist: Emergency Drugs available, Suction available, Patient being monitored, Timeout performed and Patient identified Patient Re-evaluated:Patient Re-evaluated prior to induction Oxygen Delivery Method: Circle system utilized Preoxygenation: Pre-oxygenation with 100% oxygen Induction Type: IV induction Ventilation: Mask ventilation without difficulty Grade View: Grade II Tube type: Oral Tube size: 7.5 mm Number of attempts: 1 Airway Equipment and Method: Stylet Placement Confirmation: ETT inserted through vocal cords under direct vision,  positive ETCO2 and breath sounds checked- equal and bilateral Secured at: 23 cm Tube secured with: Tape Dental Injury: Teeth and Oropharynx as per pre-operative assessment

## 2017-06-13 NOTE — Plan of Care (Signed)
POA and living will copied and placed in chart.

## 2017-06-13 NOTE — Op Note (Signed)
06/13/2017  6:35 PM  PATIENT:  Walter [name]  84 y.o. male  PRE-OPERATIVE DIAGNOSIS:  fractured right hip, intertrochanteric  POST-OPERATIVE DIAGNOSIS:  fractured right hip, intertrochanteric  PROCEDURE:  Procedure(s): INTRAMEDULLARY (IM) NAIL INTERTROCHANTRIC (Right)  SURGEON: Laurene Footman, MD  ASSISTANTS: None  ANESTHESIA:   general  EBL:  Total I/O In: 600 [I.V.:600] Out: 600 [Urine:300; Blood:300]  BLOOD ADMINISTERED:none  DRAINS: none   LOCAL MEDICATIONS USED:  NONE  SPECIMEN:  No Specimen  DISPOSITION OF SPECIMEN:  N/A  COUNTS:  YES  TOURNIQUET:  * No tourniquets in log *  IMPLANTS: Biomet affixes 9 x 180 mm 130 degree rod with 100 mm lag screw and 36 mm interlocking screw  DICTATION: .Dragon Dictation patient brought the operating room and after adequate general anesthesia was obtained the patient was transferred to the fracture table with the left leg in well-leg holder right foot in the traction boot without traction being required.  Serum was brought in in good visualization of the fracture which was essentially nondisplaced was obtained.  After prepping and draping using a barrier drape method appropriate patient identification and timeout procedure were completed and a small incision was made proximally greater trochanter.  A guidewire was inserted and the tip in a proximal reaming was carried out followed by placement of the rod to the appropriate depth.  Because of the orientation the fracture is concerned that there would might be rotation with placement of the leg screw a guidewire was inserted through the antirotation screw hole until the screw was placed and compression applied measurement was made drilling was carried out followed by tapping and placing a 100 mm lag screw followed by release of traction and compression through the compression device.  The distal interlocking screw was then placed after tightening the leg screw and a quarter rotation back to  allow for further compression.  The distal screw hole was filled with a 36 mm screw at this point the insertion handle was removed after permanent C-arm views were obtained the wounds were irrigated and closed with #1 Vicryl deep fascia 2-0 Vicryl subcutaneously and skin staples followed by Xeroform 4 x 4's ABD and tape  PLAN OF CARE: Continue as inpatient  PATIENT DISPOSITION:  PACU - hemodynamically stable.

## 2017-06-13 NOTE — Anesthesia Preprocedure Evaluation (Addendum)
Anesthesia Evaluation  Patient identified by MRN, date of birth, ID band Patient awake    Reviewed: Allergy & Precautions, NPO status , Patient's Chart, lab work & pertinent test results  Airway Mallampati: II  TM Distance: >3 FB     Dental   Pulmonary former smoker, PE   Pulmonary exam normal        Cardiovascular hypertension, + Peripheral Vascular Disease  Normal cardiovascular exam     Neuro/Psych parkinsons Dz negative psych ROS   GI/Hepatic   Endo/Other  Hypothyroidism   Renal/GU Renal InsufficiencyRenal disease  negative genitourinary   Musculoskeletal  (+) Arthritis , Osteoarthritis,    Abdominal Normal abdominal exam  (+)   Peds negative pediatric ROS (+)  Hematology   Anesthesia Other Findings Past Medical History: 2011: ARF (acute renal failure) ([place])     Comment:  after TKR No date: BPH (benign prostatic hypertrophy) 1998: DVT (deep venous thrombosis) (HCC)     Comment:  after left knee arthroscopy 2000: Hx of colonoscopy No date: Hypertension 2007: Hypothyroid     Comment:  postop from thyroidectomy (cancer scare) No date: Kidney stones     Comment:  recurrent 2004: Oral cancer ([place])     Comment:  graft from left thigh No date: Osteoarthritis of both knees No date: Parkinson's disease ([place]) 2012: Tendonitis     Comment:  from quinolone 2010: Toe amputation status (HCC)  Reproductive/Obstetrics                            Anesthesia Physical Anesthesia Plan  ASA: III  Anesthesia Plan: General   Post-op Pain Management:    Induction: Intravenous  PONV Risk Score and Plan:   Airway Management Planned: Oral ETT  Additional Equipment:   Intra-op Plan:   Post-operative Plan: Extubation in OR  Informed Consent: I have reviewed the patients History and Physical, chart, labs and discussed the procedure including the risks, benefits and alternatives for the  proposed anesthesia with the patient or authorized representative who has indicated his/her understanding and acceptance.   Dental advisory given  Plan Discussed with: CRNA and Surgeon  Anesthesia Plan Comments:         Anesthesia Quick Evaluation

## 2017-06-13 NOTE — Progress Notes (Signed)
Dr Rudene Christians at bedside. Pt's wife and son informed that surgery will be done later tonight. Pt can have breakfast and morning medicines and NPO thereafter per Dr. Rudene Christians. Pt and family aware.

## 2017-06-13 NOTE — Anesthesia Post-op Follow-up Note (Signed)
Anesthesia QCDR form completed.        

## 2017-06-13 NOTE — Progress Notes (Signed)
ANTICOAGULATION CONSULT NOTE - Initial Consult  Pharmacy Consult for Eliquis  Indication: DVT  Allergies  Allergen Reactions  . Quinolones     tendonitis    Patient Measurements: Height: 5\' 10"  (177.8 cm) Weight: 177 lb (80.3 kg) IBW/kg (Calculated) : 73 Heparin Dosing Weight:   Vital Signs: Temp: 98.4 F (36.9 C) (04/16 2022) Temp Source: Oral (04/16 2022) BP: 123/64 (04/16 2022) Pulse Rate: 90 (04/16 2022)  Labs: Recent Labs    06/12/17 1639 06/13/17 0504  HGB 14.0 13.0  HCT 41.3 37.9*  PLT 238 208  LABPROT 15.3* 15.9*  INR 1.22 1.28  CREATININE 1.65* 1.43*    Estimated Creatinine Clearance: 39.7 mL/min (A) (by C-G formula based on SCr of 1.43 mg/dL (H)).   Medical History: Past Medical History:  Diagnosis Date  . ARF (acute renal failure) ([place]) 2011   after TKR  . BPH (benign prostatic hypertrophy)   . DVT (deep venous thrombosis) ([place]) 1998   after left knee arthroscopy  . Hx of colonoscopy 2000  . Hypertension   . Hypothyroid 2007   postop from thyroidectomy (cancer scare)  . Kidney stones    recurrent  . Oral cancer ([place]) 2004   graft from left thigh  . Osteoarthritis of both knees   . Parkinson's disease ([place])   . Tendonitis 2012   from quinolone  . Toe amputation status ([place]) 2010    Medications:  Medications Prior to Admission  Medication Sig Dispense Refill Last Dose  . amLODipine (NORVASC) 5 MG tablet TAKE 1 TABLET EVERY DAY 90 tablet 3 06/12/2017 at 0830  . carbidopa-levodopa (SINEMET IR) 25-100 MG tablet TAKE 1 TABLET THREE TIMES DAILY 270 tablet 3 06/12/2017 at 1230  . Cholecalciferol (VITAMIN D) 2000 UNITS CAPS Take 2,000 Units by mouth daily.    Past Week at Unknown time  . ELIQUIS 5 MG TABS tablet TAKE 1 TABLET TWICE DAILY 180 tablet 11 06/12/2017 at 0830  . levothyroxine (SYNTHROID, LEVOTHROID) 112 MCG tablet TAKE 1 TABLET EVERY DAY BEFORE BREAKFAST 90 tablet 3 06/12/2017 at 0830  . polyethylene glycol (MIRALAX / GLYCOLAX) packet  Take 17 g by mouth daily.    06/12/2017 at 0830  . vitamin B-12 (CYANOCOBALAMIN) 1000 MCG tablet Take 1,000 mcg by mouth daily.   Past Week at Unknown time    Assessment:  Goal of Therapy:  DVT prevention Monitor platelets by anticoagulation protocol: Yes   Plan:  Pt S/P hip fracture repair.  Pt was on Eliquis 5 mg PO BID at home .   Will adjust dose to  Eliquis 2.5 mg PO BID X 2 doses to start on 4/17 @ 10:00 followed by Eliquis 5 mg PO BID to start on 4/18 @ 10:00.   Greidys Deland D 06/13/2017,8:37 PM

## 2017-06-13 NOTE — Consult Note (Signed)
Patient is seen for evaluation of right hip fracture.  He suffered a fall for no reason yesterday he normally is walker without assistive device is able to leave his home and get out into the community although he does have a shuffling gait related to his parkinsonism.  He denies prodromal symptoms.  He had x-rays in the emergency room and a CT that shows a vertically oriented intertrochanteric fracture that is a stable pattern.  On examination he has no ecchymosis and skin is intact around the hip.  There is no rotatory deformity to the lower leg he does have palpable pulse with toe flexion extension present.  Radiographs show vertically oriented entered high intertrochanteric hip fracture  Impression is right high intertrochanteric hip fracture  Plan is for ORIF with IM rod short.  Plan on doing that today to get him mobilized.  With his history of PE without having had surgery a year ago I would like to proceed with surgery today so that he can resume his Eliquis and be mobilized.

## 2017-06-13 NOTE — Progress Notes (Addendum)
Patient ID: Walter [name], male   DOB: 1931/12/12, 84 y.o.   MRN: [id number]  [facility] PROGRESS NOTE  Brittney Caraway XTK:[id number] DOB: Jan 10, 1932 DOA: 06/12/2017 PCP: Venia Carbon, MD  HPI/Subjective: Patient had a fall and softened this blow with his arm and hip.  Complaining of a little neck discomfort.  Objective: Vitals:   06/13/17 0000 06/13/17 0735  BP: (!) 151/76 135/73  Pulse: 84 74  Resp: 18   Temp: 99 F (37.2 C) 97.7 F (36.5 C)  SpO2: 91% 90%    Filed Weights   06/12/17 1602 06/12/17 2026  Weight: 79.4 kg (175 lb) 80.3 kg (177 lb)    ROS: Review of Systems  Constitutional: Negative for chills and fever.  Eyes: Negative for blurred vision.  Respiratory: Negative for cough and shortness of breath.   Cardiovascular: Negative for chest pain.  Gastrointestinal: Negative for abdominal pain, constipation, diarrhea, nausea and vomiting.  Genitourinary: Negative for dysuria.  Musculoskeletal: Positive for neck pain. Negative for joint pain.  Neurological: Negative for dizziness and headaches.   Exam: Physical Exam  Constitutional: He is oriented to person, place, and time.  HENT:  Nose: No mucosal edema.  Mouth/Throat: No oropharyngeal exudate or posterior oropharyngeal edema.  Eyes: Pupils are equal, round, and reactive to light. Conjunctivae, EOM and lids are normal.  Neck: No JVD present. Carotid bruit is not present. No edema present. No thyroid mass and no thyromegaly present.  Cardiovascular: S1 normal and S2 normal. Exam reveals no gallop.  No murmur heard. Pulses:      Dorsalis pedis pulses are 2+ on the right side, and 2+ on the left side.  Respiratory: No respiratory distress. He has no wheezes. He has no rhonchi. He has no rales.  GI: Soft. Bowel sounds are normal. There is no tenderness.  Musculoskeletal:       Right ankle: He exhibits no swelling.       Left ankle: He exhibits no swelling.  Lymphadenopathy:    He has no cervical adenopathy.   Neurological: He is alert and oriented to person, place, and time. No cranial nerve deficit.  Able to wiggle toes and feel my touching his feet.  Skin: Skin is warm. No rash noted. Nails show no clubbing.  Psychiatric: He has a normal mood and affect.      Data Reviewed: Basic Metabolic Panel: Recent Labs  Lab 06/12/17 1639 06/13/17 0504  NA 141 137  K 3.8 3.5  CL 104 106  CO2 31 27  GLUCOSE 105* 109*  BUN 27* 27*  CREATININE 1.65* 1.43*  CALCIUM 9.0 8.5*   CBC: Recent Labs  Lab 06/12/17 1639 06/13/17 0504  WBC 6.2 8.1  NEUTROABS 4.7  --   HGB 14.0 13.0  HCT 41.3 37.9*  MCV 91.5 91.4  PLT 238 208     Recent Results (from the past 240 hour(s))  Surgical pcr screen     Status: None   Collection Time: 06/13/17  6:16 AM  Result Value Ref Range Status   MRSA, PCR NEGATIVE NEGATIVE Final   Staphylococcus aureus NEGATIVE NEGATIVE Final    Comment: (NOTE) The Xpert SA Assay (FDA approved for NASAL specimens in patients 69 years of age and older), is one component of a comprehensive surveillance program. It is not intended to diagnose infection nor to guide or monitor treatment. Performed at [facility], [address] [place], [place] [address]      Studies: Dg Chest 1 View  Result  Date: 06/12/2017 CLINICAL DATA:  Right hip fracture EXAM: CHEST  1 VIEW COMPARISON:  03/03/2016 FINDINGS: Lungs are hyperexpanded. The lungs are clear without focal pneumonia, edema, pneumothorax or pleural effusion. Interstitial markings are diffusely coarsened with chronic features. Prominent skin fold noted over the lateral left hemithorax. Cardiopericardial silhouette is at upper limits of normal for size. The visualized bony structures of the thorax are intact. IMPRESSION: No active disease. Electronically Signed   By: Misty Mavryk M.D.   On: 06/12/2017 17:25   Ct Hip Right Wo Contrast  Result Date: 06/12/2017 CLINICAL DATA:  Right hip fracture secondary to a fall  today. EXAM: CT OF THE RIGHT HIP WITHOUT CONTRAST TECHNIQUE: Multidetector CT imaging of the right hip was performed according to the standard protocol. Multiplanar CT image reconstructions were also generated. COMPARISON:  Radiographs dated 06/12/2017 FINDINGS: Bones/Joint/Cartilage There is an oblique intertrochanteric fracture of the proximal right femur. Fracture extends from the lateral aspect of the subcapital region of the right femoral neck inferiorly through the lesser trochanter with slight impaction. The fracture extends approximately 4 cm distal to the lesser trochanter medially. The visualized pelvic bones are normal except for mild to moderate arthritic changes of the right hip joint with marginal osteophytes and slight joint space narrowing. Muscles and Tendons Negative. Soft tissues Focal soft tissue contusion in the subcutaneous fat posterolateral aspect of the left hip. IMPRESSION: Acute fracture of the right femoral neck extending into the intertrochanteric region and lesser trochanter and extending medially into the proximal left femoral shaft. Electronically Signed   By: Lorriane Shire M.D.   On: 06/12/2017 17:31   Dg Hip Unilat W Or Wo Pelvis 2-3 Views Right  Result Date: 06/12/2017 CLINICAL DATA:  Right hip pain.  Fall today. EXAM: DG HIP (WITH OR WITHOUT PELVIS) 2-3V RIGHT COMPARISON:  None. FINDINGS: A nondisplaced intertrochanteric fracture is present. The hip is not well visualized on the lateral view. Pelvis is otherwise intact. Left hip is unremarkable. Vascular calcifications are present. IMPRESSION: Nondisplaced right intertrochanteric fracture. Detail is limited. CT could be used for or further detail if clinically indicated. Electronically Signed   By: San Morelle M.D.   On: 06/12/2017 16:35    Scheduled Meds: . amLODipine  5 mg Oral Daily  . carbidopa-levodopa  1 tablet Oral TID  . ceFAZolin  2 g Other To OR  . cholecalciferol  2,000 Units Oral Daily  .  levothyroxine  112 mcg Oral QAC breakfast  . polyethylene glycol  17 g Oral Daily  . vitamin B-12  1,000 mcg Oral Daily   Continuous Infusions: . sodium chloride 75 mL/hr (06/12/17 2100)    Assessment/Plan:  1. Preoperative evaluation for right hip fracture.  No contraindications to surgery at this time.  Holding Eliquis. 2. Essential hypertension on Norvasc 3. Parkinson's disease continue Sinemet even if patient is n.p.o. 4. Hypothyroidism unspecified on levothyroxine 5. History of DVT/PE.  Eliquis on hold for procedure. 6. Chronic kidney disease stage III. 7. Chronic dysphasia with Parkinson's disease.  Code Status:     Code Status Orders  (From admission, onward)        Start     Ordered   06/12/17 2001  Do not attempt resuscitation (DNR)  Continuous    Question Answer Comment  In the event of cardiac or respiratory ARREST Do not call a "code blue"   In the event of cardiac or respiratory ARREST Do not perform Intubation, CPR, defibrillation or ACLS   In the event  of cardiac or respiratory ARREST Use medication by any route, position, wound care, and other measures to relive pain and suffering. May use oxygen, suction and manual treatment of airway obstruction as needed for comfort.      06/12/17 2000    Code Status History    Date Active Date Inactive Code Status Order ID Comments User Context   03/03/2016 2223 03/05/2016 1837 Full Code [id number]  Lance Coon, MD Inpatient    Advance Directive Documentation     Most Recent Value  Type of Advance Directive  Healthcare Power of Attorney, Living will  Pre-existing out of facility DNR order (yellow form or pink MOST form)  -  "MOST" Form in Place?  -     Family Communication: wife at bedside Disposition Plan: Likely will go to rehab on postoperative day 3  Consultants:  Orthopedic surgery  Time spent: 28 minutes  [place]

## 2017-06-14 ENCOUNTER — Encounter: Payer: Self-pay | Admitting: Orthopedic Surgery

## 2017-06-14 LAB — CBC
HCT: 38.7 % — ABNORMAL LOW (ref 40.0–52.0)
Hemoglobin: 13.3 g/dL (ref 13.0–18.0)
MCH: 31.4 pg (ref 26.0–34.0)
MCHC: 34.4 g/dL (ref 32.0–36.0)
MCV: 91.1 fL (ref 80.0–100.0)
PLATELETS: 195 10*3/uL (ref 150–440)
RBC: 4.25 MIL/uL — AB (ref 4.40–5.90)
RDW: 13.7 % (ref 11.5–14.5)
WBC: 9.3 10*3/uL (ref 3.8–10.6)

## 2017-06-14 LAB — BASIC METABOLIC PANEL
ANION GAP: 7 (ref 5–15)
BUN: 23 mg/dL — AB (ref 6–20)
CHLORIDE: 107 mmol/L (ref 101–111)
CO2: 26 mmol/L (ref 22–32)
Calcium: 8.1 mg/dL — ABNORMAL LOW (ref 8.9–10.3)
Creatinine, Ser: 1.42 mg/dL — ABNORMAL HIGH (ref 0.61–1.24)
GFR calc Af Amer: 51 mL/min — ABNORMAL LOW (ref >60)
GFR calc non Af Amer: 44 mL/min — ABNORMAL LOW (ref >60)
Glucose, Bld: 160 mg/dL — ABNORMAL HIGH (ref 65–99)
Potassium: 4.2 mmol/L (ref 3.5–5.1)
SODIUM: 140 mmol/L (ref 135–145)

## 2017-06-14 MED ORDER — APIXABAN 5 MG PO TABS
5.0000 mg | ORAL_TABLET | Freq: Two times a day (BID) | ORAL | Status: DC
  Administered 2017-06-14 – 2017-06-16 (×4): 5 mg via ORAL
  Filled 2017-06-14 (×4): qty 1

## 2017-06-14 MED ORDER — LACTULOSE 10 GM/15ML PO SOLN
30.0000 g | Freq: Two times a day (BID) | ORAL | Status: DC
  Administered 2017-06-14 (×2): 30 g via ORAL
  Filled 2017-06-14 (×2): qty 60

## 2017-06-14 MED ORDER — HYDROCODONE-ACETAMINOPHEN 7.5-325 MG/15ML PO SOLN
10.0000 mL | ORAL | Status: DC | PRN
  Administered 2017-06-14 – 2017-06-15 (×2): 10 mL via ORAL
  Filled 2017-06-14 (×2): qty 15

## 2017-06-14 NOTE — Evaluation (Signed)
Physical Therapy Evaluation Patient Details Name: Walter [name] MRN: [id number] DOB: [birth year] Today's Date: 06/14/2017   History of Present Illness  Pt is an 84 y.o. male with a known history of prostate hypertrophy, DVT of lower extremity on oral Eliquis for anticoagulation, hypertension, hypothyroidism, and Parkinson's disease who lost balance and fell on the sidewalk.Marland Kitchen He landed on his right hip and has an abrasion injury over the right wrist area.  Pt diagnosed with R hip intertrochanteric fracture.  Pt is now s/p R hip IM nail.  PMH includes above as well as: ARF after L TKR, OA, R toe amputation, and oral cancer.       Clinical Impression  Pt presents with deficits in strength, transfers, mobility, gait, balance, and activity tolerance.  Overall pt presented with very poor functional mobility and was very guarded on the RLE.  Pt was near total assist for all bed mobility tasks and even with +2 extensive assistance was unable to stand from an elevated EOB.  Pt did state that he has a poor tolerance to pain.  Pt will benefit from PT services in a SNF setting upon discharge to safely address above deficits for decreased caregiver assistance and eventual return to PLOF.\     Follow Up Recommendations SNF    Equipment Recommendations  Rolling walker with 5" wheels;Other (comment)(TBD at next venue of care if discharges to SNF)    Recommendations for Other Services       Precautions / Restrictions Precautions Precautions: Fall Restrictions Weight Bearing Restrictions: Yes RLE Weight Bearing: Weight bearing as tolerated      Mobility  Bed Mobility Overal bed mobility: Needs Assistance Bed Mobility: Sit to Supine;Supine to Sit     Supine to sit: +2 for physical assistance;Max assist Sit to supine: Max assist;+2 for physical assistance   General bed mobility comments: Pt required near total assist with bed mobility tasks with almost no participation from patient provided despite max  verbal and tactile cues for sequencing.   Transfers                 General transfer comment: +2 extensive assistance provided during sit to/from stand transfer attempt at EOB with pt unable to clear EOB and limited by pain  Ambulation/Gait             General Gait Details: Unable  Stairs            Wheelchair Mobility    Modified Rankin (Stroke Patients Only)       Balance Overall balance assessment: Needs assistance Sitting-balance support: Feet supported;Bilateral upper extremity supported Sitting balance-Leahy Scale: Fair   Postural control: Posterior lean     Standing balance comment: Unable                             Pertinent Vitals/Pain Pain Assessment: 0-10 Pain Location: No pain at rest    [place] expects to be discharged to:: Private residence([facility] ) Living Arrangements: Spouse/significant other Available Help at Discharge: Family;Available 24 hours/day Type of Home: Independent living facility Home Access: Level entry     Home Layout: One level Home Equipment: Cane - single point      Prior Function Level of Independence: Needs assistance   Gait / Transfers Assistance Needed: Ind amb limited community distances without AD, no other fall history, min A from spouse in and out of bed; attends Parkinson's boxing class 2x/wk.  ADL's /  Homemaking Assistance Needed: Min A from spouse with ADLs        Hand Dominance   Dominant Hand: Right    Extremity/Trunk Assessment   Upper Extremity Assessment Upper Extremity Assessment: Generalized weakness    Lower Extremity Assessment Lower Extremity Assessment: Generalized weakness;RLE deficits/detail RLE: Unable to fully assess due to pain RLE Sensation: WNL       Communication   Communication: No difficulties  Cognition Arousal/Alertness: Awake/alert Behavior During Therapy: WFL for tasks assessed/performed Overall Cognitive Status: Within  Functional Limits for tasks assessed                                        General Comments      Exercises Total Joint Exercises Ankle Circles/Pumps: AROM;Both;5 reps;10 reps Quad Sets: Strengthening;Both;5 reps;10 reps Gluteal Sets: Strengthening;Both;10 reps Heel Slides: AAROM;Left;10 reps(Unable on the RLE) Hip ABduction/ADduction: AAROM;Both;10 reps Straight Leg Raises: AAROM;Both;10 reps Long Arc Quad: AROM;Both;10 reps Knee Flexion: AROM;Both;10 reps   Assessment/Plan    PT Assessment Patient needs continued PT services  PT Problem List Decreased strength;Decreased activity tolerance;Decreased balance;Decreased knowledge of use of DME;Decreased mobility       PT Treatment Interventions DME instruction;Gait training;Functional mobility training;Balance training;Therapeutic exercise;Therapeutic activities;Patient/family education    PT Goals (Current goals can be found in the Care Plan section)  Acute Rehab PT Goals Patient Stated Goal: To be more active PT Goal Formulation: With patient Time For Goal Achievement: 06/27/17 Potential to Achieve Goals: Good    Frequency BID   Barriers to discharge Inaccessible home environment;Decreased caregiver support      Co-evaluation               AM-PAC PT "6 Clicks" Daily Activity  Outcome Measure Difficulty turning over in bed (including adjusting bedclothes, sheets and blankets)?: Unable Difficulty moving from lying on back to sitting on the side of the bed? : Unable Difficulty sitting down on and standing up from a chair with arms (e.g., wheelchair, bedside commode, etc,.)?: Unable Help needed moving to and from a bed to chair (including a wheelchair)?: Total Help needed walking in hospital room?: Total Help needed climbing 3-5 steps with a railing? : Total 6 Click Score: 6    End of Session Equipment Utilized During Treatment: Gait belt;Oxygen Activity Tolerance: Patient limited by  pain Patient left: in bed;with bed alarm set;with call bell/phone within reach;with SCD's reapplied Nurse Communication: Mobility status PT Visit Diagnosis: Muscle weakness (generalized) (M62.81);Other abnormalities of gait and mobility (R26.89)    Time: 2703-5009 PT Time Calculation (min) (ACUTE ONLY): 42 min   Charges:   PT Evaluation $PT Eval Low Complexity: 1 Low PT Treatments $Therapeutic Exercise: 8-22 mins   PT G Codes:        DRoyetta Asal PT, DPT 06/14/17, 1:39 PM

## 2017-06-14 NOTE — Progress Notes (Signed)
Physical Therapy Treatment Patient Details Name: Walter [name] MRN: [id number] DOB: 09-19-1931 Today's Date: 06/14/2017    History of Present Illness Pt is an 84 y.o. male with a known history of prostate hypertrophy, DVT of lower extremity on oral Eliquis for anticoagulation, hypertension, hypothyroidism, and Parkinson's disease who lost balance and fell on the sidewalk.Marland Kitchen He landed on his right hip and has an abrasion injury over the right wrist area.  Pt diagnosed with R hip intertrochanteric fracture.  Pt is now s/p R hip IM nail.  PMH includes above as well as: ARF after L TKR, OA, R toe amputation, and oral cancer.       PT Comments    Pt presents with deficits in strength, transfers, mobility, gait, balance, and activity tolerance.  Pt requires +2 max, near total, assist with bed mobility tasks and declined transfer attempt this session secondary to pain from transfer attempts during the earlier session.  Pt stated would attempt to stand/walk during tomorrow's session.  Pt tolerated below therex fairly well but did require slow, gentle movements and frequent therapeutic rest breaks.  Overall pt remains very limited by pain.  Pt will benefit from PT services in a SNF setting upon discharge to safely address above deficits for decreased caregiver assistance and eventual return to PLOF.     Follow Up Recommendations  SNF     Equipment Recommendations  Other (comment)(TBD at next venue of care)    Recommendations for Other Services       Precautions / Restrictions Precautions Precautions: Fall Restrictions Weight Bearing Restrictions: Yes RLE Weight Bearing: Weight bearing as tolerated    Mobility  Bed Mobility Overal bed mobility: Needs Assistance Bed Mobility: Sit to Supine;Supine to Sit     Supine to sit: +2 for physical assistance;Max assist Sit to supine: Max assist;+2 for physical assistance   General bed mobility comments: Pt required near total assist with bed mobility  tasks with almost no participation from patient provided despite max verbal and tactile cues for sequencing.   Transfers                 General transfer comment: Pt requested no transfer attempts this session secondary to pain after transfer attempts in the AM session but stated would transfer tomorrow.    Ambulation/Gait             General Gait Details: Unable   Stairs             Wheelchair Mobility    Modified Rankin (Stroke Patients Only)       Balance Overall balance assessment: Needs assistance Sitting-balance support: Feet supported;Bilateral upper extremity supported Sitting balance-Leahy Scale: Fair   Postural control: Posterior lean     Standing balance comment: NT                            Cognition Arousal/Alertness: Awake/alert Behavior During Therapy: WFL for tasks assessed/performed Overall Cognitive Status: Within Functional Limits for tasks assessed                                        Exercises Total Joint Exercises Ankle Circles/Pumps: AROM;Both;5 reps;10 reps Quad Sets: Strengthening;Both;5 reps;10 reps Gluteal Sets: Strengthening;Both;10 reps;5 reps Short Arc Quad: AROM;Both;10 reps;15 reps Heel Slides: AAROM;Left;10 reps Hip ABduction/ADduction: AAROM;Both;10 reps Straight Leg Raises: AAROM;Both;10 reps Long Arc Quad: AROM;Both;10  reps Knee Flexion: AROM;Both;10 reps Other Exercises Other Exercises: HEP education for BLE APs, QS, and GS x 10 each 5-6x/day    General Comments        Pertinent Vitals/Pain Pain Assessment: No/denies pain Pain Location: No pain at rest    [place] expects to be discharged to:: Private residence([facility] ) Living Arrangements: Spouse/significant other Available Help at Discharge: Family;Available 24 hours/day Type of Home: Independent living facility Home Access: Level entry   Home Layout: One level Home Equipment: Cane - single  point      Prior Function Level of Independence: Needs assistance  Gait / Transfers Assistance Needed: Ind amb limited community distances without AD, no other fall history, min A from spouse in and out of bed; attends Parkinson's boxing class 2x/wk. ADL's / Homemaking Assistance Needed: Min A from spouse with ADLs     PT Goals (current goals can now be found in the care plan section) Acute Rehab PT Goals Patient Stated Goal: To be more active PT Goal Formulation: With patient Time For Goal Achievement: 06/27/17 Potential to Achieve Goals: Good    Frequency    BID      PT Plan      Co-evaluation              AM-PAC PT "6 Clicks" Daily Activity  Outcome Measure  Difficulty turning over in bed (including adjusting bedclothes, sheets and blankets)?: Unable Difficulty moving from lying on back to sitting on the side of the bed? : Unable Difficulty sitting down on and standing up from a chair with arms (e.g., wheelchair, bedside commode, etc,.)?: Unable Help needed moving to and from a bed to chair (including a wheelchair)?: Total Help needed walking in hospital room?: Total Help needed climbing 3-5 steps with a railing? : Total 6 Click Score: 6    End of Session Equipment Utilized During Treatment: Gait belt;Oxygen Activity Tolerance: Patient limited by pain Patient left: in bed;with bed alarm set;with call bell/phone within reach;with SCD's reapplied Nurse Communication: Mobility status PT Visit Diagnosis: Muscle weakness (generalized) (M62.81);Other abnormalities of gait and mobility (R26.89)     Time: 2706-2376 PT Time Calculation (min) (ACUTE ONLY): 24 min  Charges:  $Therapeutic Exercise: 23-37 mins                    G Codes:       DRoyetta Asal PT, DPT 06/14/17, 2:31 PM

## 2017-06-14 NOTE — Progress Notes (Signed)
Patient ID: Walter [name], male   DOB: 1932-01-07, 84 y.o.   MRN: [id number]   [facility] PROGRESS NOTE  Bethel Gaglio LZJ:[id number] DOB: December 14, 1931 DOA: 06/12/2017 PCP: Venia Carbon, MD  HPI/Subjective: Patient stated he had a lot of hip pain when he tried to move around.  Also complaining of some left shoulder pain.  The neck pain that he had yesterday is gone.  He has a history of constipation.  Objective: Vitals:   06/14/17 1101 06/14/17 1132  BP: 138/74   Pulse:    Resp:    Temp:    SpO2:  96%    Filed Weights   06/12/17 1602 06/12/17 2026  Weight: 79.4 kg (175 lb) 80.3 kg (177 lb)    ROS: Review of Systems  Constitutional: Negative for chills and fever.  Eyes: Negative for blurred vision.  Respiratory: Negative for cough and shortness of breath.   Cardiovascular: Negative for chest pain.  Gastrointestinal: Negative for abdominal pain, constipation, diarrhea, nausea and vomiting.  Genitourinary: Negative for dysuria.  Musculoskeletal: Positive for joint pain and neck pain.  Neurological: Negative for dizziness and headaches.   Exam: Physical Exam  Constitutional: He is oriented to person, place, and time.  HENT:  Nose: No mucosal edema.  Mouth/Throat: No oropharyngeal exudate or posterior oropharyngeal edema.  Eyes: Pupils are equal, round, and reactive to light. Conjunctivae, EOM and lids are normal.  Neck: No JVD present. Carotid bruit is not present. No edema present. No thyroid mass and no thyromegaly present.  Cardiovascular: S1 normal and S2 normal. Exam reveals no gallop.  No murmur heard. Pulses:      Dorsalis pedis pulses are 2+ on the right side, and 2+ on the left side.  Respiratory: No respiratory distress. He has no wheezes. He has no rhonchi. He has no rales.  GI: Soft. Bowel sounds are normal. There is no tenderness.  Musculoskeletal:       Right ankle: He exhibits no swelling.       Left ankle: He exhibits no swelling.  Lymphadenopathy:     He has no cervical adenopathy.  Neurological: He is alert and oriented to person, place, and time. No cranial nerve deficit.  Able to wiggle toes and feel my touching his feet.  Skin: Skin is warm. No rash noted. Nails show no clubbing.  Psychiatric: He has a normal mood and affect.      Data Reviewed: Basic Metabolic Panel: Recent Labs  Lab 06/12/17 1639 06/13/17 0504 06/14/17 0351  NA 141 137 140  K 3.8 3.5 4.2  CL 104 106 107  CO2 31 27 26   GLUCOSE 105* 109* 160*  BUN 27* 27* 23*  CREATININE 1.65* 1.43* 1.42*  CALCIUM 9.0 8.5* 8.1*   CBC: Recent Labs  Lab 06/12/17 1639 06/13/17 0504 06/14/17 0351  WBC 6.2 8.1 9.3  NEUTROABS 4.7  --   --   HGB 14.0 13.0 13.3  HCT 41.3 37.9* 38.7*  MCV 91.5 91.4 91.1  PLT 238 208 195     Recent Results (from the past 240 hour(s))  Surgical pcr screen     Status: None   Collection Time: 06/13/17  6:16 AM  Result Value Ref Range Status   MRSA, PCR NEGATIVE NEGATIVE Final   Staphylococcus aureus NEGATIVE NEGATIVE Final    Comment: (NOTE) The Xpert SA Assay (FDA approved for NASAL specimens in patients 32 years of age and older), is one component of a comprehensive surveillance program. It is not intended  to diagnose infection nor to guide or monitor treatment. Performed at [facility], [address], [place], [place] [address]      Studies: Dg Chest 1 View  Result Date: 06/12/2017 CLINICAL DATA:  Right hip fracture EXAM: CHEST  1 VIEW COMPARISON:  03/03/2016 FINDINGS: Lungs are hyperexpanded. The lungs are clear without focal pneumonia, edema, pneumothorax or pleural effusion. Interstitial markings are diffusely coarsened with chronic features. Prominent skin fold noted over the lateral left hemithorax. Cardiopericardial silhouette is at upper limits of normal for size. The visualized bony structures of the thorax are intact. IMPRESSION: No active disease. Electronically Signed   By: Misty Treysen M.D.   On:  06/12/2017 17:25   Ct Hip Right Wo Contrast  Result Date: 06/12/2017 CLINICAL DATA:  Right hip fracture secondary to a fall today. EXAM: CT OF THE RIGHT HIP WITHOUT CONTRAST TECHNIQUE: Multidetector CT imaging of the right hip was performed according to the standard protocol. Multiplanar CT image reconstructions were also generated. COMPARISON:  Radiographs dated 06/12/2017 FINDINGS: Bones/Joint/Cartilage There is an oblique intertrochanteric fracture of the proximal right femur. Fracture extends from the lateral aspect of the subcapital region of the right femoral neck inferiorly through the lesser trochanter with slight impaction. The fracture extends approximately 4 cm distal to the lesser trochanter medially. The visualized pelvic bones are normal except for mild to moderate arthritic changes of the right hip joint with marginal osteophytes and slight joint space narrowing. Muscles and Tendons Negative. Soft tissues Focal soft tissue contusion in the subcutaneous fat posterolateral aspect of the left hip. IMPRESSION: Acute fracture of the right femoral neck extending into the intertrochanteric region and lesser trochanter and extending medially into the proximal left femoral shaft. Electronically Signed   By: Lorriane Shire M.D.   On: 06/12/2017 17:31   Dg Chest Port 1 View  Result Date: 06/13/2017 CLINICAL DATA:  Cough with hypoxia EXAM: PORTABLE CHEST 1 VIEW COMPARISON:  06/12/2017, 03/03/2016 FINDINGS: Development of airspace disease at the left base. Streaky atelectasis at the right base. Possible small left effusion. Stable cardiomediastinal silhouette with aortic atherosclerosis. No pneumothorax. IMPRESSION: 1. Development of probable small left effusion and left basilar dense airspace disease, atelectasis versus pneumonia or aspiration. 2. Streaky atelectasis at the right base Electronically Signed   By: Donavan Foil M.D.   On: 06/13/2017 20:49   Dg Hip Operative Unilat W Or W/o Pelvis  Right  Result Date: 06/13/2017 CLINICAL DATA:  84 y/o  M; intraoperative right femur fixation. EXAM: OPERATIVE right HIP (WITH PELVIS IF PERFORMED) 3 VIEWS TECHNIQUE: Fluoroscopic spot image(s) were submitted for interpretation post-operatively. COMPARISON:  06/12/2017 CT of the right hip. FINDINGS: Right femoral neck and intertrochanteric fracture. Intraoperative intramedullary nail and proximal lag screw fixation with distal transverse interlocking screw. Fluoro time is 53 seconds. IMPRESSION: Intraoperative fluoroscopy of right femur intramedullary nail fixation. Fluoro time 53 seconds. Electronically Signed   By: Kristine Garbe M.D.   On: 06/13/2017 20:02   Dg Hip Unilat W Or Wo Pelvis 2-3 Views Right  Result Date: 06/12/2017 CLINICAL DATA:  Right hip pain.  Fall today. EXAM: DG HIP (WITH OR WITHOUT PELVIS) 2-3V RIGHT COMPARISON:  None. FINDINGS: A nondisplaced intertrochanteric fracture is present. The hip is not well visualized on the lateral view. Pelvis is otherwise intact. Left hip is unremarkable. Vascular calcifications are present. IMPRESSION: Nondisplaced right intertrochanteric fracture. Detail is limited. CT could be used for or further detail if clinically indicated. Electronically Signed   By: Harrell Gave  Mattern M.D.   On: 06/12/2017 16:35    Scheduled Meds: . amLODipine  5 mg Oral Daily  . apixaban  5 mg Oral BID  . carbidopa-levodopa  1 tablet Oral TID  . cholecalciferol  2,000 Units Oral Daily  . docusate sodium  100 mg Oral BID  . lactulose  30 g Oral BID  . levothyroxine  112 mcg Oral QAC breakfast  . polyethylene glycol  17 g Oral Daily  . vitamin B-12  1,000 mcg Oral Daily   Continuous Infusions: . methocarbamol (ROBAXIN)  IV      Assessment/Plan:  1. Closed right hip fracture status post operative repair.  Pain control.  When I saw the patient he was on 3 L of oxygen.  Asked the nursing staff to take a pulse ox on room air and it looks like it is good.   Medications for bowel movement. 2. Essential hypertension on Norvasc 3. Parkinson's disease continue Sinemet even if patient is n.p.o. 4. Hypothyroidism unspecified on levothyroxine 5. History of DVT/PE.  Eliquis  low-dose restarted today and back to full dose tomorrow  6. Chronic kidney disease stage III. 7. Chronic dysphasia with Parkinson's disease.  Code Status:     Code Status Orders  (From admission, onward)        Start     Ordered   06/12/17 2001  Do not attempt resuscitation (DNR)  Continuous    Question Answer Comment  In the event of cardiac or respiratory ARREST Do not call a "code blue"   In the event of cardiac or respiratory ARREST Do not perform Intubation, CPR, defibrillation or ACLS   In the event of cardiac or respiratory ARREST Use medication by any route, position, wound care, and other measures to relive pain and suffering. May use oxygen, suction and manual treatment of airway obstruction as needed for comfort.      06/12/17 2000    Code Status History    Date Active Date Inactive Code Status Order ID Comments User Context   03/03/2016 2223 03/05/2016 1837 Full Code [id number]  Lance Coon, MD Inpatient    Advance Directive Documentation     Most Recent Value  Type of Advance Directive  Healthcare Power of Attorney, Living will  Pre-existing out of facility DNR order (yellow form or pink MOST form)  -  "MOST" Form in Place?  -     Family Communication: wife yesterday Disposition Plan: Potentially out to rehab on Thursday or Friday, depending on clinical course.  This will have to be improved by the patient's insurance.  Consultants:  Orthopedic surgery  Time spent: 26 minutes  [place]

## 2017-06-14 NOTE — NC FL2 (Signed)
[place] LEVEL OF CARE SCREENING TOOL     IDENTIFICATION  Patient Name: Walter [name] Birthdate: January 26, 1932 Sex: male Admission Date (Current Location): 06/12/2017  Advance and [place] Number:  Engineering geologist and Address:  [facility], [address], [place], [place] [address]      Provider Number: [id number]  Attending Physician Name and Address:  Loletha Grayer, MD  Relative Name and Phone Number:  Lashawn, Orrego [phone]  [phone] or Kosta,Todd Son [phone]  or Dellia Nims Daughter   [phone]     Current Level of Care: Hospital Recommended Level of Care: [place] Prior Approval Number:    Date Approved/Denied:   PASRR Number: [id number]  Discharge Plan: SNF    Current Diagnoses: Patient Active Problem List   Diagnosis Date Noted  . Hip fracture ([place]) 06/12/2017  . Advance directive discussed with patient 11/01/2016  . Renal cyst, right 06/06/2016  . Acute deep vein thrombosis (DVT) of popliteal vein of right lower extremity ([place]) 04/28/2016  . Benign essential HTN 04/28/2016  . CKD (chronic kidney disease) stage 3, GFR 30-59 ml/min (HCC) 03/05/2016  . Acute saddle pulmonary embolism without acute cor pulmonale (HCC) 03/03/2016  . Vitamin B12 deficiency 09/09/2014  . Preventative health care 08/04/2014  . Parkinson's disease ([place]) 08/04/2014  . Esophageal diverticulum 01/01/2014  . Actinic keratosis 07/31/2013  . Hypertension   . Osteoarthritis of both knees   . Toe amputation status ([place])   . Benign prostatic hyperplasia   . Kidney stones   . Hypothyroid   . Tendonitis     Orientation RESPIRATION BLADDER Height & Weight     Self, Time, Situation, Place  Normal Continent Weight: 177 lb (80.3 kg) Height:  5\' 10"  (177.8 cm)  BEHAVIORAL SYMPTOMS/MOOD NEUROLOGICAL BOWEL NUTRITION STATUS      Continent Diet(Regular diet)  AMBULATORY STATUS COMMUNICATION OF NEEDS Skin   Limited  Assist Verbally Surgical wounds                       Personal Care Assistance Level of Assistance  Bathing, Feeding, Dressing Bathing Assistance: Limited assistance Feeding assistance: Limited assistance Dressing Assistance: Limited assistance     Functional Limitations Info  Sight, Hearing, Speech Sight Info: Adequate Hearing Info: Adequate Speech Info: Adequate    SPECIAL CARE FACTORS FREQUENCY  PT (By licensed PT)     PT Frequency: 5x a week              Contractures Contractures Info: Not present    Additional Factors Info  Code Status, Allergies Code Status Info: DNR Allergies Info: Quinolones           Current Medications (06/14/2017):  This is the current hospital active medication list Current Facility-Administered Medications  Medication Dose Route Frequency Provider Last Rate Last Dose  . acetaminophen (TYLENOL) tablet 650 mg  650 mg Oral Q6H PRN Hessie Knows, MD       Or  . acetaminophen (TYLENOL) suppository 650 mg  650 mg Rectal Q6H PRN Hessie Knows, MD      . alum & mag hydroxide-simeth (MAALOX/MYLANTA) 200-200-20 MG/5ML suspension 30 mL  30 mL Oral Q4H PRN Hessie Knows, MD      . amLODipine (NORVASC) tablet 5 mg  5 mg Oral Daily Hessie Knows, MD   5 mg at 06/14/17 1102  . apixaban (ELIQUIS) tablet 5 mg  5 mg Oral BID Ramond Dial, RPH      .  bisacodyl (DULCOLAX) suppository 10 mg  10 mg Rectal Daily PRN Hessie Knows, MD      . carbidopa-levodopa (SINEMET IR) 25-100 MG per tablet immediate release 1 tablet  1 tablet Oral TID Hessie Knows, MD   1 tablet at 06/14/17 1107  . cholecalciferol (VITAMIN D) tablet 2,000 Units  2,000 Units Oral Daily Hessie Knows, MD   2,000 Units at 06/14/17 1102  . docusate sodium (COLACE) capsule 100 mg  100 mg Oral BID Hessie Knows, MD   100 mg at 06/14/17 1102  . HYDROcodone-acetaminophen (HYCET) 7.5-325 mg/15 ml solution 10 mL  10 mL Oral Q4H PRN Hessie Knows, MD      . lactulose (CHRONULAC) 10 GM/15ML  solution 30 g  30 g Oral BID Loletha Grayer, MD   30 g at 06/14/17 1131  . levothyroxine (SYNTHROID, LEVOTHROID) tablet 112 mcg  112 mcg Oral QAC breakfast Hessie Knows, MD   112 mcg at 06/14/17 0805  . magnesium citrate solution 1 Bottle  1 Bottle Oral Once PRN Hessie Knows, MD      . magnesium hydroxide (MILK OF MAGNESIA) suspension 30 mL  30 mL Oral Daily PRN Hessie Knows, MD      . menthol-cetylpyridinium (CEPACOL) lozenge 3 mg  1 lozenge Oral PRN Hessie Knows, MD       Or  . phenol (CHLORASEPTIC) mouth spray 1 spray  1 spray Mouth/Throat PRN Hessie Knows, MD      . methocarbamol (ROBAXIN) tablet 500 mg  500 mg Oral Q6H PRN Hessie Knows, MD       Or  . methocarbamol (ROBAXIN) 500 mg in dextrose 5 % 50 mL IVPB  500 mg Intravenous Q6H PRN Hessie Knows, MD      . metoCLOPramide (REGLAN) tablet 5-10 mg  5-10 mg Oral Q8H PRN Hessie Knows, MD       Or  . metoCLOPramide (REGLAN) injection 5-10 mg  5-10 mg Intravenous Q8H PRN Hessie Knows, MD      . morphine 2 MG/ML injection 2 mg  2 mg Intravenous Q4H PRN Hessie Knows, MD   2 mg at 06/13/17 1418  . ondansetron (ZOFRAN) tablet 4 mg  4 mg Oral Q6H PRN Hessie Knows, MD       Or  . ondansetron [facility]) injection 4 mg  4 mg Intravenous Q6H PRN Hessie Knows, MD      . polyethylene glycol (MIRALAX / GLYCOLAX) packet 17 g  17 g Oral Daily Hessie Knows, MD   17 g at 06/14/17 1101  . senna-docusate (Senokot-S) tablet 1 tablet  1 tablet Oral QHS PRN Hessie Knows, MD      . vitamin B-12 (CYANOCOBALAMIN) tablet 1,000 mcg  1,000 mcg Oral Daily Hessie Knows, MD   1,000 mcg at 06/14/17 1102     Discharge Medications: Please see discharge summary for a list of discharge medications.  Relevant Imaging Results:  Relevant Lab Results:   Additional Information SSN [id number]  Ross Ludwig, [place]

## 2017-06-14 NOTE — Clinical Social Work Note (Signed)
Clinical Social Work Assessment  Patient Details  Name: Walter [name] MRN: [id number] Date of Birth: December 09, 1931  Date of referral:  06/14/17               Reason for consult:  Facility Placement                Permission sought to share information with:  Family Supports, Customer service manager Permission granted to share information::  Yes, Verbal Permission Granted  Name::     Jonavin, Seder [phone]  [phone] or Ginty,Todd Son [phone] or Dellia Nims Daughter   [phone]   Agency::  SNF admissions  Relationship::     Contact Information:     Housing/Transportation Living arrangements for the past 2 months:  Independent Living Facility([facility] [facility]) Source of Information:  Patient, Adult Children, Spouse Patient Interpreter Needed:  None Criminal Activity/Legal Involvement Pertinent to Current Situation/Hospitalization:  No - Comment as needed Significant Relationships:  Adult Children, Spouse Lives with:  Spouse Do you feel safe going back to the place where you live?  No Need for family participation in patient care:  No (Coment)  Care giving concerns:  Patient and family feel he needs some short term rehab before he is ready to return back home.   Social Worker assessment / plan:  Patient is an 84 year old male who is married, lives with his wife, and is alert and oriented x4.  Patient states he has not been to rehab before, CSW explained to patient and family process for looking for SNF placement and what to expect at SNF.  Patient's wife was concerned that his insurance would not pay for stay, CSW explained to her that [facility] has an agreement with [employer], that since patient already lives in the community that they will cover his stay.  Patient wife requested that [facility] admissions worker call her as well.  CSW answered other questions that patient's family had, and CSW was given permission to begin bed search at [facility].  CSW explained if  insurance does not approve patient, then they will have to go home with home health or pay privately for SNF.  Family is aware of situation.  Employment status:  Retired Nurse, adult PT Recommendations:  [place] / Referral to community resources:  [place]  Patient/Family's Response to care:  Patient's family is nervous about him going to SNF, but are in agreement to having him go for short term rehab.  Patient/Family's Understanding of and Emotional Response to Diagnosis, Current Treatment, and Prognosis: Patient and family are aware of current treatment plan and diagnosis.  Patient and family are hopeful that he will not have to be in SNF for very long, but are aware he may have to be at SNF for a longer period of time more then a few days.  Emotional Assessment Appearance:  Appears stated age Attitude/Demeanor/Rapport:    Affect (typically observed):  Appropriate, Calm, Stable Orientation:  Oriented to  Time, Oriented to Place, Oriented to Self, Oriented to Situation Alcohol / Substance use:  Not Applicable Psych involvement (Current and /or in the community):  No (Comment)  Discharge Needs  Concerns to be addressed:  Care Coordination, Lack of Support Readmission within the last 30 days:  No Current discharge risk:  Lack of support system Barriers to Discharge:  Continued Medical Work up   Anell Barr 06/14/2017, 5:27 PM

## 2017-06-14 NOTE — Clinical Social Work Note (Signed)
CSW received consult that patient may need SNF for short term rehab, awaiting PT recommendations.  Patient is managed by regular [employer], CSW to continue to follow patient's progress throughout discharge planning.  [name] Broom. [place], MSW, [place]  06/14/2017 1:14 PM

## 2017-06-14 NOTE — Progress Notes (Signed)
ANTICOAGULATION CONSULT NOTE - Initial Consult  Pharmacy Consult for Eliquis  Indication: DVT  Allergies  Allergen Reactions  . Quinolones     tendonitis    Patient Measurements: Height: 5\' 10"  (177.8 cm) Weight: 177 lb (80.3 kg) IBW/kg (Calculated) : 73 Heparin Dosing Weight:   Vital Signs: Temp: 98.8 F (37.1 C) (04/17 0447) Temp Source: Oral (04/17 0447) BP: 138/74 (04/17 1101) Pulse Rate: 81 (04/17 0826)  Labs: Recent Labs    06/12/17 1639 06/13/17 0504 06/14/17 0351  HGB 14.0 13.0 13.3  HCT 41.3 37.9* 38.7*  PLT 238 208 195  LABPROT 15.3* 15.9*  --   INR 1.22 1.28  --   CREATININE 1.65* 1.43* 1.42*    Estimated Creatinine Clearance: 40 mL/min (A) (by C-G formula based on SCr of 1.42 mg/dL (H)).   Medical History: Past Medical History:  Diagnosis Date  . ARF (acute renal failure) ([place]) 2011   after TKR  . BPH (benign prostatic hypertrophy)   . DVT (deep venous thrombosis) ([place]) 1998   after left knee arthroscopy  . Hx of colonoscopy 2000  . Hypertension   . Hypothyroid 2007   postop from thyroidectomy (cancer scare)  . Kidney stones    recurrent  . Oral cancer ([place]) 2004   graft from left thigh  . Osteoarthritis of both knees   . Parkinson's disease ([place])   . Tendonitis 2012   from quinolone  . Toe amputation status ([place]) 2010    Medications:  Medications Prior to Admission  Medication Sig Dispense Refill Last Dose  . amLODipine (NORVASC) 5 MG tablet TAKE 1 TABLET EVERY DAY 90 tablet 3 06/12/2017 at 0830  . carbidopa-levodopa (SINEMET IR) 25-100 MG tablet TAKE 1 TABLET THREE TIMES DAILY 270 tablet 3 06/12/2017 at 1230  . Cholecalciferol (VITAMIN D) 2000 UNITS CAPS Take 2,000 Units by mouth daily.    Past Week at Unknown time  . ELIQUIS 5 MG TABS tablet TAKE 1 TABLET TWICE DAILY 180 tablet 11 06/12/2017 at 0830  . levothyroxine (SYNTHROID, LEVOTHROID) 112 MCG tablet TAKE 1 TABLET EVERY DAY BEFORE BREAKFAST 90 tablet 3 06/12/2017 at 0830  .  polyethylene glycol (MIRALAX / GLYCOLAX) packet Take 17 g by mouth daily.    06/12/2017 at 0830  . vitamin B-12 (CYANOCOBALAMIN) 1000 MCG tablet Take 1,000 mcg by mouth daily.   Past Week at Unknown time    Assessment:  Goal of Therapy:  DVT prevention Monitor platelets by anticoagulation protocol: Yes   Plan:  Pt S/P hip fracture repair.  Pt was on Eliquis 5 mg PO BID at home .   Pt was given 2.5mg  this AM (~12hr post op). I will increase dose back to home dose of 5mg  BID tonight (>24hr post op). Pt Hbg is stable at 13. Pt is at high risk of clot due to his hx of PE.  Ramond Dial, Pharm.D, BCPS Clinical Pharmacist  06/14/2017,12:23 PM

## 2017-06-14 NOTE — Clinical Social Work Note (Signed)
Patient is from [place], and plans to go to [facility] for short term rehab.  CSW spoke to [facility], and they have started insurance authorization with [facility].  CSW spoke to patient and his family regarding process of going to SNF, and they are in agreement to going to SNF once insurance has been approved and patient is medically ready for discharge.  [name] Broom. [place], MSW, [place]  06/14/2017 5:23 PM

## 2017-06-14 NOTE — Progress Notes (Addendum)
   Subjective: 1 Day Post-Op Procedure(s) (LRB): INTRAMEDULLARY (IM) NAIL INTERTROCHANTRIC (Right) Patient reports pain as 0 on 0-10 scale.   Patient is well, and has had no acute complaints or problems We will start therapy today.  Plan is to go Rehab after hospital stay. no nausea and no vomiting Patient denies any chest pains or shortness of breath. Objective: Vital signs in last 24 hours: Temp:  [97.5 F (36.4 C)-100.2 F (37.9 C)] 98.8 F (37.1 C) (04/17 0447) Pulse Rate:  [74-99] 78 (04/17 0447) Resp:  [13-19] 18 (04/17 0447) BP: (122-173)/(64-79) 140/74 (04/17 0447) SpO2:  [88 %-97 %] 96 % (04/17 0447) Heels are non tender and elevated off the bed using rolled towels  Intake/Output from previous day: 04/16 0701 - 04/17 0700 In: 700 [I.V.:600; IV Piggyback:100] Out: 1200 [Urine:900; Blood:300] Intake/Output this shift: No intake/output data recorded.  Recent Labs    06/12/17 1639 06/13/17 0504 06/14/17 0351  HGB 14.0 13.0 13.3   Recent Labs    06/13/17 0504 06/14/17 0351  WBC 8.1 9.3  RBC 4.15* 4.25*  HCT 37.9* 38.7*  PLT 208 195   Recent Labs    06/13/17 0504 06/14/17 0351  NA 137 140  K 3.5 4.2  CL 106 107  CO2 27 26  BUN 27* 23*  CREATININE 1.43* 1.42*  GLUCOSE 109* 160*  CALCIUM 8.5* 8.1*   Recent Labs    06/12/17 1639 06/13/17 0504  INR 1.22 1.28    EXAM General - Patient is Alert, Appropriate and Oriented Extremity - Neurologically intact Neurovascular intact Sensation intact distally Intact pulses distally Dorsiflexion/Plantar flexion intact Compartment soft Dressing - dressing C/D/I Motor Function - intact, moving foot and toes well on exam.    Past Medical History:  Diagnosis Date  . ARF (acute renal failure) ([place]) 2011   after TKR  . BPH (benign prostatic hypertrophy)   . DVT (deep venous thrombosis) ([place]) 1998   after left knee arthroscopy  . Hx of colonoscopy 2000  . Hypertension   . Hypothyroid 2007   postop from  thyroidectomy (cancer scare)  . Kidney stones    recurrent  . Oral cancer ([place]) 2004   graft from left thigh  . Osteoarthritis of both knees   . Parkinson's disease ([place])   . Tendonitis 2012   from quinolone  . Toe amputation status ([place]) 2010    Assessment/Plan: 1 Day Post-Op Procedure(s) (LRB): INTRAMEDULLARY (IM) NAIL INTERTROCHANTRIC (Right) Active Problems:   Hip fracture (HCC)  Estimated body mass index is 25.4 kg/m as calculated from the following:   Height as of this encounter: 5\' 10"  (1.778 m).   Weight as of this encounter: 80.3 kg (177 lb). Advance diet Up with therapy D/C IV fluids Plan for discharge tomorrow Discharge to SNF  Labs: Were reviewed and acceptable DVT Prophylaxis - Lovenox, Foot Pumps and TED hose Weight-Bearing as tolerated to right leg D/C O2 and Pulse OX and try on Room Air Begin working on bowel movement Will need to continue Lovenox 40 mg for 2 weeks postop TED stockings are to be worn bilaterally for 6 weeks Staples will need to be removed 2 weeks postop and apply benzoin and Steri-Strips Patient will need a follow-up in [facility] in 6 weeks No shower until the staples are removed   R. Waldo [place] 06/14/2017, 7:11 AM

## 2017-06-15 LAB — BASIC METABOLIC PANEL
Anion gap: 4 — ABNORMAL LOW (ref 5–15)
BUN: 23 mg/dL — AB (ref 6–20)
CO2: 31 mmol/L (ref 22–32)
CREATININE: 1.31 mg/dL — AB (ref 0.61–1.24)
Calcium: 8.2 mg/dL — ABNORMAL LOW (ref 8.9–10.3)
Chloride: 106 mmol/L (ref 101–111)
GFR calc Af Amer: 56 mL/min — ABNORMAL LOW (ref >60)
GFR, EST NON AFRICAN AMERICAN: 48 mL/min — AB (ref >60)
Glucose, Bld: 122 mg/dL — ABNORMAL HIGH (ref 65–99)
POTASSIUM: 3.9 mmol/L (ref 3.5–5.1)
Sodium: 141 mmol/L (ref 135–145)

## 2017-06-15 LAB — CBC
HCT: 37.7 % — ABNORMAL LOW (ref 40.0–52.0)
Hemoglobin: 13.1 g/dL (ref 13.0–18.0)
MCH: 31.5 pg (ref 26.0–34.0)
MCHC: 34.6 g/dL (ref 32.0–36.0)
MCV: 91 fL (ref 80.0–100.0)
PLATELETS: 201 10*3/uL (ref 150–440)
RBC: 4.15 MIL/uL — ABNORMAL LOW (ref 4.40–5.90)
RDW: 13.9 % (ref 11.5–14.5)
WBC: 10 10*3/uL (ref 3.8–10.6)

## 2017-06-15 MED ORDER — ACETAMINOPHEN 650 MG RE SUPP: 650.0000 mg | Freq: Four times a day (QID) | RECTAL | Status: DC | PRN

## 2017-06-15 MED ORDER — ACETAMINOPHEN 325 MG PO TABS
650.0000 mg | ORAL_TABLET | Freq: Four times a day (QID) | ORAL | Status: DC | PRN
  Administered 2017-06-15: 650 mg via ORAL
  Filled 2017-06-15: qty 2

## 2017-06-15 MED ORDER — HYDROCODONE-ACETAMINOPHEN 7.5-325 MG/15ML PO SOLN: 5.0000 mL | ORAL | 0 refills | Status: DC | PRN

## 2017-06-15 MED ORDER — HYDROCODONE-ACETAMINOPHEN 7.5-325 MG/15ML PO SOLN
5.0000 mL | ORAL | Status: DC | PRN
  Administered 2017-06-15 – 2017-06-16 (×2): 5 mL via ORAL
  Filled 2017-06-15 (×2): qty 15

## 2017-06-15 MED ORDER — LACTULOSE 10 GM/15ML PO SOLN: 30.0000 g | Freq: Every day | ORAL | Status: DC | PRN

## 2017-06-15 MED ORDER — TRAZODONE HCL 50 MG PO TABS: 50.0000 mg | ORAL_TABLET | Freq: Every evening | ORAL | Status: DC | PRN

## 2017-06-15 MED ORDER — MELATONIN 5 MG PO TABS
5.0000 mg | ORAL_TABLET | Freq: Every day | ORAL | Status: DC
  Administered 2017-06-15: 5 mg via ORAL
  Filled 2017-06-15 (×2): qty 1

## 2017-06-15 NOTE — Progress Notes (Signed)
Physical Therapy Treatment Patient Details Name: Walter [name] MRN: [id number] DOB: 10/02/1931 Today's Date: 06/15/2017    History of Present Illness Pt is an 84 y.o. male with a known history of prostate hypertrophy, DVT of lower extremity on oral Eliquis for anticoagulation, hypertension, hypothyroidism, and Parkinson's disease who lost balance and fell on the sidewalk.Marland Kitchen He landed on his right hip and has an abrasion injury over the right wrist area.  Pt diagnosed with R hip intertrochanteric fracture.  Pt is now s/p R hip IM nail.  PMH includes above as well as: ARF after L TKR, OA, R toe amputation, and oral cancer.       PT Comments    Pt presents with deficits in strength, transfers, mobility, gait, balance, and activity tolerance.  Pt continues to require extensive +2 assistance with all bed mobility tasks and transfers.  Pt stood at the EOB with heavily flexed trunk and was only able to straighten up slightly with extensive cues.  Pt was able to take 1-2 very small steps forward and back with the RLE but was unable to advance the LLE other than by shuffling it a small amount on the floor.  Overall pt remains extremely limited functionally, is at a very high risk for falls, and will benefit from PT services in a SNF setting upon discharge to safely address above deficits for decreased caregiver assistance and eventual return to PLOF.     Follow Up Recommendations  SNF     Equipment Recommendations       Recommendations for Other Services       Precautions / Restrictions Precautions Precautions: Fall Restrictions Weight Bearing Restrictions: Yes RLE Weight Bearing: Weight bearing as tolerated    Mobility  Bed Mobility Overal bed mobility: Needs Assistance Bed Mobility: Sit to Supine;Supine to Sit;Rolling Rolling: Mod assist;+2 for physical assistance   Supine to sit: +2 for physical assistance;Max assist Sit to supine: Max assist;+2 for physical assistance   General bed  mobility comments: Pt required near total assist with bed mobility tasks with very little participation from patient provided despite max verbal and tactile cues for sequencing.   Transfers Overall transfer level: Needs assistance Equipment used: Rolling walker (2 wheeled) Transfers: Sit to/from Stand Sit to Stand: Mod assist;+2 physical assistance;From elevated surface         General transfer comment: Pt able to stand at EOB this session with heavy +2 assist to stand and then to prevent posterior LOB while in standing  Ambulation/Gait Ambulation/Gait assistance: Mod assist;+2 physical assistance Ambulation Distance (Feet): 1 Feet Assistive device: Rolling walker (2 wheeled) Gait Pattern/deviations: Step-to pattern;Decreased step length - left;Decreased stance time - right;Antalgic     General Gait Details: Pt able to take 1-2 very small steps at the EOB but was very antalgic on the RLE with decreased LLE step length   Stairs             Wheelchair Mobility    Modified Rankin (Stroke Patients Only)       Balance Overall balance assessment: Needs assistance Sitting-balance support: Feet supported;Bilateral upper extremity supported Sitting balance-Leahy Scale: Fair Sitting balance - Comments: Posterior lean in sitting that improved during the session Postural control: Posterior lean Standing balance support: Bilateral upper extremity supported Standing balance-Leahy Scale: Poor Standing balance comment: Posterior instability                            Cognition Arousal/Alertness: Awake/alert Behavior  During Therapy: WFL for tasks assessed/performed Overall Cognitive Status: Within Functional Limits for tasks assessed                                        Exercises Other Exercises Other Exercises: Anterior weight shifting activities in sitting and standing for reduced posterior lean    General Comments        Pertinent  Vitals/Pain Pain Assessment: No/denies pain Pain Location: No pain at rest Pain Intervention(s): Premedicated before session;Monitored during session;Limited activity within patient's tolerance    Home Living                      Prior Function            PT Goals (current goals can now be found in the care plan section) Progress towards PT goals: Not progressing toward goals - comment(Pt limited by RLE pain and weakness)    Frequency    BID      PT Plan Current plan remains appropriate    Co-evaluation              AM-PAC PT "6 Clicks" Daily Activity  Outcome Measure                   End of Session Equipment Utilized During Treatment: Gait belt Activity Tolerance: Patient limited by pain Patient left: in bed;with bed alarm set;with call bell/phone within reach;with SCD's reapplied Nurse Communication: Mobility status PT Visit Diagnosis: Muscle weakness (generalized) (M62.81);Other abnormalities of gait and mobility (R26.89)     Time: 4081-4481 PT Time Calculation (min) (ACUTE ONLY): 27 min  Charges:  $Therapeutic Activity: 23-37 mins                    G Codes:       DRoyetta Asal PT, DPT 06/15/17, 2:39 PM

## 2017-06-15 NOTE — Progress Notes (Addendum)
   Subjective: 2 Days Post-Op Procedure(s) (LRB): INTRAMEDULLARY (IM) NAIL INTERTROCHANTRIC (Right) Patient reports pain as mild.   Patient is well, and has had no acute complaints or problems We will continue therapy today.  Plan is to go Rehab after hospital stay. no nausea and no vomiting Patient denies any chest pains or shortness of breath.  Objective: Vital signs in last 24 hours: Temp:  [97.8 F (36.6 C)-98.6 F (37 C)] 97.8 F (36.6 C) (04/18 0414) Pulse Rate:  [78-85] 85 (04/18 0414) Resp:  [18] 18 (04/17 2335) BP: (136-146)/(74-83) 138/77 (04/18 0414) SpO2:  [91 %-96 %] 91 % (04/18 0414) Heels are non tender and elevated off the bed using rolled towels  Intake/Output from previous day: 04/17 0701 - 04/18 0700 In: 360 [P.O.:360] Out: -  Intake/Output this shift: Total I/O In: 240 [P.O.:240] Out: -   Recent Labs    06/12/17 1639 06/13/17 0504 06/14/17 0351 06/15/17 0430  HGB 14.0 13.0 13.3 13.1   Recent Labs    06/14/17 0351 06/15/17 0430  WBC 9.3 10.0  RBC 4.25* 4.15*  HCT 38.7* 37.7*  PLT 195 201   Recent Labs    06/14/17 0351 06/15/17 0430  NA 140 141  K 4.2 3.9  CL 107 106  CO2 26 31  BUN 23* 23*  CREATININE 1.42* 1.31*  GLUCOSE 160* 122*  CALCIUM 8.1* 8.2*   Recent Labs    06/12/17 1639 06/13/17 0504  INR 1.22 1.28    EXAM General - Patient is Alert, Appropriate and Oriented Extremity - Neurologically intact Neurovascular intact Sensation intact distally Intact pulses distally Dorsiflexion/Plantar flexion intact Compartment soft Dressing - dressing C/D/I Motor Function - intact, moving foot and toes well on exam.    Past Medical History:  Diagnosis Date  . ARF (acute renal failure) ([place]) 2011   after TKR  . BPH (benign prostatic hypertrophy)   . DVT (deep venous thrombosis) ([place]) 1998   after left knee arthroscopy  . Hx of colonoscopy 2000  . Hypertension   . Hypothyroid 2007   postop from thyroidectomy (cancer  scare)  . Kidney stones    recurrent  . Oral cancer ([place]) 2004   graft from left thigh  . Osteoarthritis of both knees   . Parkinson's disease ([place])   . Tendonitis 2012   from quinolone  . Toe amputation status ([place]) 2010    Assessment/Plan: 2 Days Post-Op Procedure(s) (LRB): INTRAMEDULLARY (IM) NAIL INTERTROCHANTRIC (Right) Active Problems:   Hip fracture (HCC)  Estimated body mass index is 25.4 kg/m as calculated from the following:   Height as of this encounter: 5\' 10"  (1.778 m).   Weight as of this encounter: 80.3 kg (177 lb). Advance diet Up with therapy  Labs: Were reviewed and acceptable DVT Prophylaxis - Foot Pumps, TED hose and Eliquis Weight-Bearing as tolerated to right leg D/C O2 and Pulse OX and try on Room Air Continue working on bowel movement continue Eliquis TED stockings are to be worn bilaterally for 6 weeks Staples will need to be removed 06/27/17 and apply benzoin and Steri-Strips Patient will need a follow-up in [facility] in 6 weeks No shower until the staples are removed  Duanne Guess PA-C [place] 06/15/2017, 6:25 AM

## 2017-06-15 NOTE — Progress Notes (Signed)
Physical Therapy Treatment Patient Details Name: Walter [name] MRN: [id number] DOB: Nov 07, 1931 Today's Date: 06/15/2017    History of Present Illness Pt is an 84 y.o. male with a known history of prostate hypertrophy, DVT of lower extremity on oral Eliquis for anticoagulation, hypertension, hypothyroidism, and Parkinson's disease who lost balance and fell on the sidewalk.Marland Kitchen He landed on his right hip and has an abrasion injury over the right wrist area.  Pt diagnosed with R hip intertrochanteric fracture.  Pt is now s/p R hip IM nail.  PMH includes above as well as: ARF after L TKR, OA, R toe amputation, and oral cancer.       PT Comments    Pt presents with deficits in strength, transfers, mobility, gait, balance, and activity tolerance.  Pt continues to required +2 extensive assistance with bed mobility tasks with very little participation provided.  Pt highly limited by RLE pain with any movement but was able to stand from elevated EOB with +2 assist but presented with poor standing balance and required continued assist to prevent posterior LOB.  Pt was able to lift RLE from the floor several times with minimal clearance and was then able to take 2-3 very small steps but was highly antalgic on the RLE with minimal RLE stance time while advancing the LLE.  Pt will benefit from PT services in a SNF setting upon discharge to safely address above deficits for decreased caregiver assistance and eventual return to PLOF.     Follow Up Recommendations  SNF     Equipment Recommendations       Recommendations for Other Services       Precautions / Restrictions Precautions Precautions: Fall Restrictions Weight Bearing Restrictions: Yes RLE Weight Bearing: Weight bearing as tolerated    Mobility  Bed Mobility Overal bed mobility: Needs Assistance Bed Mobility: Sit to Supine;Supine to Sit     Supine to sit: +2 for physical assistance;Max assist Sit to supine: Max assist;+2 for physical  assistance   General bed mobility comments: Pt required near total assist with bed mobility tasks with very little participation from patient provided despite max verbal and tactile cues for sequencing.   Transfers Overall transfer level: Needs assistance Equipment used: Rolling walker (2 wheeled) Transfers: Sit to/from Stand Sit to Stand: Mod assist;+2 physical assistance;From elevated surface         General transfer comment: Pt able to stand at EOB this session with heavy +2 assist to stand and then to prevent posterior LOB while in standing  Ambulation/Gait Ambulation/Gait assistance: Mod assist;+2 physical assistance Ambulation Distance (Feet): 1 Feet Assistive device: Rolling walker (2 wheeled) Gait Pattern/deviations: Step-to pattern;Decreased step length - left;Decreased stance time - right;Antalgic   Gait velocity interpretation: <1.31 ft/sec, indicative of household ambulator General Gait Details: Pt able to take 2-3 very small steps at the EOB but was very antalgic on the RLE with decreased LLE stance time   Stairs             Wheelchair Mobility    Modified Rankin (Stroke Patients Only)       Balance Overall balance assessment: Needs assistance Sitting-balance support: Feet supported;Bilateral upper extremity supported Sitting balance-Leahy Scale: Fair     Standing balance support: Bilateral upper extremity supported Standing balance-Leahy Scale: Poor Standing balance comment: Posterior instability                            Cognition Arousal/Alertness: Awake/alert Behavior During  Therapy: WFL for tasks assessed/performed Overall Cognitive Status: Within Functional Limits for tasks assessed                                        Exercises Total Joint Exercises Ankle Circles/Pumps: AROM;Both;5 reps;10 reps Quad Sets: Strengthening;Both;5 reps;10 reps Gluteal Sets: Strengthening;Both;10 reps;5 reps Short Arc Quad:  AROM;Both;10 reps;15 reps Heel Slides: AAROM;Left;10 reps Hip ABduction/ADduction: AAROM;Both;10 reps Straight Leg Raises: AAROM;Both;10 reps Long Arc Quad: AROM;Both;10 reps Knee Flexion: AROM;Both;10 reps Marching in Standing: AROM;Right;5 reps;Standing Other Exercises Other Exercises: HEP education with pt and daughter for BLE APs, QS, and GS x 10 each 5-6x/day    General Comments        Pertinent Vitals/Pain Pain Assessment: No/denies pain Pain Location: No pain at rest Pain Intervention(s): Premedicated before session;Monitored during session;Limited activity within patient's tolerance    Home Living                      Prior Function            PT Goals (current goals can now be found in the care plan section) Progress towards PT goals: Progressing toward goals    Frequency    BID      PT Plan Current plan remains appropriate    Co-evaluation              AM-PAC PT "6 Clicks" Daily Activity  Outcome Measure                   End of Session Equipment Utilized During Treatment: Gait belt Activity Tolerance: Patient limited by pain Patient left: in bed;with bed alarm set;with call bell/phone within reach;with SCD's reapplied Nurse Communication: Mobility status PT Visit Diagnosis: Muscle weakness (generalized) (M62.81);Other abnormalities of gait and mobility (R26.89)     Time: 0905(and 9:25-9:50 with break for MD visit)-0918 PT Time Calculation (min) (ACUTE ONLY): 13 min  Charges:  $Therapeutic Exercise: 23-37 mins $Therapeutic Activity: 8-22 mins                    G Codes:       D. Royetta Asal PT, DPT 06/15/17, 10:03 AM

## 2017-06-15 NOTE — Care Management Important Message (Signed)
Important Message  Patient Details  Name: Walter [name] MRN: [id number] Date of Birth: 19-Dec-1931   Medicare Important Message Given:  Yes    [name] A Gerlad Pelzel 06/15/2017, 10:46 AM

## 2017-06-15 NOTE — Progress Notes (Signed)
Patient ID: Walter [name], male   DOB: 1931-08-12, 84 y.o.   MRN: [id number]    [employer] PROGRESS NOTE  Kelon Easom XLK:[id number] DOB: 08-Oct-1931 DOA: 06/12/2017 PCP: Venia Carbon, MD  HPI/Subjective: Patient tells a story about things on the wall being on the floor.  He states that he felt like he was floating.  He did not sleep very well last night.  Complains of some joint pain in the right hip.  Feeling better now.  He did receive the pain medication around 12 midnight last night.  Had a bowel movement last night.  Objective: Vitals:   06/15/17 0809 06/15/17 1025  BP: (!) 147/67 (!) 141/68  Pulse: 86   Resp:    Temp: 98 F (36.7 C)   SpO2: 90%     Filed Weights   06/12/17 1602 06/12/17 2026  Weight: 79.4 kg (175 lb) 80.3 kg (177 lb)    ROS: Review of Systems  Constitutional: Negative for chills and fever.  Eyes: Negative for blurred vision.  Respiratory: Negative for cough and shortness of breath.   Cardiovascular: Negative for chest pain.  Gastrointestinal: Negative for abdominal pain, constipation, diarrhea, nausea and vomiting.  Genitourinary: Negative for dysuria.  Musculoskeletal: Positive for joint pain and neck pain.  Neurological: Negative for dizziness and headaches.   Exam: Physical Exam  Constitutional: He is oriented to person, place, and time.  HENT:  Nose: No mucosal edema.  Mouth/Throat: No oropharyngeal exudate or posterior oropharyngeal edema.  Eyes: Pupils are equal, round, and reactive to light. Conjunctivae, EOM and lids are normal.  Neck: No JVD present. Carotid bruit is not present. No edema present. No thyroid mass and no thyromegaly present.  Cardiovascular: S1 normal and S2 normal. Exam reveals no gallop.  No murmur heard. Pulses:      Dorsalis pedis pulses are 2+ on the right side, and 2+ on the left side.  Respiratory: No respiratory distress. He has no wheezes. He has no rhonchi. He has no rales.  GI: Soft. Bowel sounds are  normal. There is no tenderness.  Musculoskeletal:       Right ankle: He exhibits no swelling.       Left ankle: He exhibits no swelling.  Lymphadenopathy:    He has no cervical adenopathy.  Neurological: He is alert and oriented to person, place, and time. No cranial nerve deficit.  Able to wiggle toes and feel my touching his feet.  Skin: Skin is warm. No rash noted. Nails show no clubbing.  Psychiatric: He has a normal mood and affect.      Data Reviewed: Basic Metabolic Panel: Recent Labs  Lab 06/12/17 1639 06/13/17 0504 06/14/17 0351 06/15/17 0430  NA 141 137 140 141  K 3.8 3.5 4.2 3.9  CL 104 106 107 106  CO2 31 27 26 31   GLUCOSE 105* 109* 160* 122*  BUN 27* 27* 23* 23*  CREATININE 1.65* 1.43* 1.42* 1.31*  CALCIUM 9.0 8.5* 8.1* 8.2*   CBC: Recent Labs  Lab 06/12/17 1639 06/13/17 0504 06/14/17 0351 06/15/17 0430  WBC 6.2 8.1 9.3 10.0  NEUTROABS 4.7  --   --   --   HGB 14.0 13.0 13.3 13.1  HCT 41.3 37.9* 38.7* 37.7*  MCV 91.5 91.4 91.1 91.0  PLT 238 208 195 201     Recent Results (from the past 240 hour(s))  Surgical pcr screen     Status: None   Collection Time: 06/13/17  6:16 AM  Result Value  Ref Range Status   MRSA, PCR NEGATIVE NEGATIVE Final   Staphylococcus aureus NEGATIVE NEGATIVE Final    Comment: (NOTE) The Xpert SA Assay (FDA approved for NASAL specimens in patients 50 years of age and older), is one component of a comprehensive surveillance program. It is not intended to diagnose infection nor to guide or monitor treatment. Performed at [facility], [address] [place], [place] [address]      Studies: Dg Chest [facility] 1 View  Result Date: 06/13/2017 CLINICAL DATA:  Cough with hypoxia EXAM: PORTABLE CHEST 1 VIEW COMPARISON:  06/12/2017, 03/03/2016 FINDINGS: Development of airspace disease at the left base. Streaky atelectasis at the right base. Possible small left effusion. Stable cardiomediastinal silhouette with aortic  atherosclerosis. No pneumothorax. IMPRESSION: 1. Development of probable small left effusion and left basilar dense airspace disease, atelectasis versus pneumonia or aspiration. 2. Streaky atelectasis at the right base Electronically Signed   By: Donavan Foil M.D.   On: 06/13/2017 20:49   Dg Hip Operative Unilat W Or W/o Pelvis Right  Result Date: 06/13/2017 CLINICAL DATA:  84 y/o  M; intraoperative right femur fixation. EXAM: OPERATIVE right HIP (WITH PELVIS IF PERFORMED) 3 VIEWS TECHNIQUE: Fluoroscopic spot image(s) were submitted for interpretation post-operatively. COMPARISON:  06/12/2017 CT of the right hip. FINDINGS: Right femoral neck and intertrochanteric fracture. Intraoperative intramedullary nail and proximal lag screw fixation with distal transverse interlocking screw. Fluoro time is 53 seconds. IMPRESSION: Intraoperative fluoroscopy of right femur intramedullary nail fixation. Fluoro time 53 seconds. Electronically Signed   By: Kristine Garbe M.D.   On: 06/13/2017 20:02    Scheduled Meds: . amLODipine  5 mg Oral Daily  . apixaban  5 mg Oral BID  . carbidopa-levodopa  1 tablet Oral TID  . cholecalciferol  2,000 Units Oral Daily  . docusate sodium  100 mg Oral BID  . levothyroxine  112 mcg Oral QAC breakfast  . Melatonin  5 mg Oral QHS  . polyethylene glycol  17 g Oral Daily  . vitamin B-12  1,000 mcg Oral Daily   Continuous Infusions: . methocarbamol (ROBAXIN)  IV      Assessment/Plan:  1. Postoperative delirium.  Try to get him sleeping tonight with melatonin and as needed trazodone.  Avoid Ativan and Haldol in a patient with Parkinson's.  Get rid of IV pain medications.  Try Tylenol first for pain.  Decrease the dose of his pain medication orally.   2. Closed right hip fracture status post operative repair.  Pain control.  When I saw the patient he was on 3 L of oxygen.  Asked the nursing staff to take a pulse ox on room air and it looks like it is good.  Medications  for bowel movement. 3. Essential hypertension on Norvasc 4. Parkinson's disease. continue Sinemet 5. Hypothyroidism unspecified on levothyroxine 6. History of DVT/PE.  Eliquis. 7. Chronic kidney disease stage III. 8. Chronic dysphasia with Parkinson's disease.  Code Status:     Code Status Orders  (From admission, onward)        Start     Ordered   06/12/17 2001  Do not attempt resuscitation (DNR)  Continuous    Question Answer Comment  In the event of cardiac or respiratory ARREST Do not call a "code blue"   In the event of cardiac or respiratory ARREST Do not perform Intubation, CPR, defibrillation or ACLS   In the event of cardiac or respiratory ARREST Use medication by any route, position, wound  care, and other measures to relive pain and suffering. May use oxygen, suction and manual treatment of airway obstruction as needed for comfort.      06/12/17 2000    Code Status History    Date Active Date Inactive Code Status Order ID Comments User Context   03/03/2016 2223 03/05/2016 1837 Full Code [id number]  Lance Coon, MD Inpatient    Advance Directive Documentation     Most Recent Value  Type of Advance Directive  Healthcare Power of Attorney, Living will  Pre-existing out of facility DNR order (yellow form or pink MOST form)  -  "MOST" Form in Place?  -     Disposition Plan: Hopefully can go out to rehab on Friday.  This will have to be improved by the patient's insurance.  Consultants:  Orthopedic surgery  Time spent: 25 minutes  [place]

## 2017-06-15 NOTE — Clinical Social Work Note (Signed)
CSW received phone call from [facility] who said they have received insurance authorization for patient to go to SNF.  Authorization is good for 72 hours from today.  CSW to continue to follow patient's progress throughout discharge planning.  [name] Broom. [place], MSW, [place]  06/15/2017 3:18 PM

## 2017-06-16 DIAGNOSIS — M6281 Muscle weakness (generalized): Secondary | ICD-10-CM | POA: Diagnosis not present

## 2017-06-16 DIAGNOSIS — I10 Essential (primary) hypertension: Secondary | ICD-10-CM | POA: Diagnosis not present

## 2017-06-16 DIAGNOSIS — F05 Delirium due to known physiological condition: Secondary | ICD-10-CM | POA: Diagnosis not present

## 2017-06-16 DIAGNOSIS — G20 Parkinson's disease: Secondary | ICD-10-CM | POA: Diagnosis not present

## 2017-06-16 DIAGNOSIS — S72141D Displaced intertrochanteric fracture of right femur, subsequent encounter for closed fracture with routine healing: Secondary | ICD-10-CM | POA: Diagnosis not present

## 2017-06-16 DIAGNOSIS — N400 Enlarged prostate without lower urinary tract symptoms: Secondary | ICD-10-CM | POA: Diagnosis not present

## 2017-06-16 DIAGNOSIS — S72009A Fracture of unspecified part of neck of unspecified femur, initial encounter for closed fracture: Secondary | ICD-10-CM | POA: Diagnosis not present

## 2017-06-16 DIAGNOSIS — N183 Chronic kidney disease, stage 3 (moderate): Secondary | ICD-10-CM | POA: Diagnosis not present

## 2017-06-16 DIAGNOSIS — Z7401 Bed confinement status: Secondary | ICD-10-CM | POA: Diagnosis not present

## 2017-06-16 DIAGNOSIS — R05 Cough: Secondary | ICD-10-CM | POA: Diagnosis not present

## 2017-06-16 DIAGNOSIS — R1310 Dysphagia, unspecified: Secondary | ICD-10-CM | POA: Diagnosis not present

## 2017-06-16 DIAGNOSIS — Z741 Need for assistance with personal care: Secondary | ICD-10-CM | POA: Diagnosis not present

## 2017-06-16 DIAGNOSIS — S72001A Fracture of unspecified part of neck of right femur, initial encounter for closed fracture: Secondary | ICD-10-CM | POA: Diagnosis not present

## 2017-06-16 DIAGNOSIS — Z86711 Personal history of pulmonary embolism: Secondary | ICD-10-CM | POA: Diagnosis not present

## 2017-06-16 DIAGNOSIS — R262 Difficulty in walking, not elsewhere classified: Secondary | ICD-10-CM | POA: Diagnosis not present

## 2017-06-16 DIAGNOSIS — R2681 Unsteadiness on feet: Secondary | ICD-10-CM | POA: Diagnosis not present

## 2017-06-16 DIAGNOSIS — I2692 Saddle embolus of pulmonary artery without acute cor pulmonale: Secondary | ICD-10-CM | POA: Diagnosis not present

## 2017-06-16 DIAGNOSIS — N3000 Acute cystitis without hematuria: Secondary | ICD-10-CM | POA: Diagnosis not present

## 2017-06-16 MED ORDER — TRAZODONE HCL 50 MG PO TABS: 25.0000 mg | ORAL_TABLET | Freq: Every evening | ORAL | Status: DC | PRN

## 2017-06-16 MED ORDER — ACETAMINOPHEN 325 MG PO TABS: 650.0000 mg | ORAL_TABLET | Freq: Four times a day (QID) | ORAL | Status: AC | PRN

## 2017-06-16 MED ORDER — DOCUSATE SODIUM 100 MG PO CAPS: 100.0000 mg | ORAL_CAPSULE | Freq: Two times a day (BID) | ORAL | 0 refills | Status: DC

## 2017-06-16 NOTE — Discharge Summary (Signed)
[facility] at [facility], [place] y.[place] DOB 1931/11/18, MRN [id number]. Admission date: 06/12/2017 Discharge Date 06/16/2017 Primary MD Venia Carbon, MD Admitting Physician Saundra Shelling, MD  Admission Diagnosis  Current use of long term anticoagulation [Z79.01] Closed 2-part intertrochanteric fracture of right femur, initial encounter [facility]) [S72.141A]  Discharge Diagnosis   Active Problems: Closed right hip fracture status post repair Postoperative delirium Essential hypertension Parkinson's disease Hypothyroidism Chronic kidney disease stage III Chronic dysphagia with Parkinson's disease     Hospital Course  Walter [name]  is a 84 y.o. male with a known history of prostate hypertrophy, DVT of lower extremity on oral Eliquis for anticoagulation, hypertension, hypothyroidism, Parkinson's disease is a resident of [facility] facility patient lost balance and fell on the sidewalk.  Patient was noted to have a closed right hip fracture.  He was admitted and seen by the orthopedic physician and underwent surgery.  Patient tolerated the procedure well.  He did have some postop delirium but that has since resolved.  Will require aggressive PT.     Continue oxygen 2 L wean off as tolerated.  Continue incentive spirometry every hour while awake        Consults  orthopedic surgery  Significant Tests:  See full reports for all details     Dg Chest 1 View  Result Date: 06/12/2017 CLINICAL DATA:  Right hip fracture EXAM: CHEST  1 VIEW COMPARISON:  03/03/2016 FINDINGS: Lungs are hyperexpanded. The lungs are clear without focal pneumonia, edema, pneumothorax or pleural effusion. Interstitial markings are diffusely coarsened with chronic features. Prominent skin fold noted over the lateral left hemithorax. Cardiopericardial silhouette is at upper limits of normal for size. The visualized bony structures of the thorax are intact. IMPRESSION: No active  disease. Electronically Signed   By: Misty Tynell M.D.   On: 06/12/2017 17:25   Ct Hip Right Wo Contrast  Result Date: 06/12/2017 CLINICAL DATA:  Right hip fracture secondary to a fall today. EXAM: CT OF THE RIGHT HIP WITHOUT CONTRAST TECHNIQUE: Multidetector CT imaging of the right hip was performed according to the standard protocol. Multiplanar CT image reconstructions were also generated. COMPARISON:  Radiographs dated 06/12/2017 FINDINGS: Bones/Joint/Cartilage There is an oblique intertrochanteric fracture of the proximal right femur. Fracture extends from the lateral aspect of the subcapital region of the right femoral neck inferiorly through the lesser trochanter with slight impaction. The fracture extends approximately 4 cm distal to the lesser trochanter medially. The visualized pelvic bones are normal except for mild to moderate arthritic changes of the right hip joint with marginal osteophytes and slight joint space narrowing. Muscles and Tendons Negative. Soft tissues Focal soft tissue contusion in the subcutaneous fat posterolateral aspect of the left hip. IMPRESSION: Acute fracture of the right femoral neck extending into the intertrochanteric region and lesser trochanter and extending medially into the proximal left femoral shaft. Electronically Signed   By: Lorriane Shire M.D.   On: 06/12/2017 17:31   Dg Chest Port 1 View  Result Date: 06/13/2017 CLINICAL DATA:  Cough with hypoxia EXAM: PORTABLE CHEST 1 VIEW COMPARISON:  06/12/2017, 03/03/2016 FINDINGS: Development of airspace disease at the left base. Streaky atelectasis at the right base. Possible small left effusion. Stable cardiomediastinal silhouette with aortic atherosclerosis. No pneumothorax. IMPRESSION: 1. Development of probable small left effusion and left basilar dense airspace disease, atelectasis versus pneumonia or aspiration. 2. Streaky atelectasis at the right base Electronically Signed   By: Madie Reno.D.  On:  06/13/2017 20:49   Dg Hip Operative Unilat W Or W/o Pelvis Right  Result Date: 06/13/2017 CLINICAL DATA:  84 y/o  M; intraoperative right femur fixation. EXAM: OPERATIVE right HIP (WITH PELVIS IF PERFORMED) 3 VIEWS TECHNIQUE: Fluoroscopic spot image(s) were submitted for interpretation post-operatively. COMPARISON:  06/12/2017 CT of the right hip. FINDINGS: Right femoral neck and intertrochanteric fracture. Intraoperative intramedullary nail and proximal lag screw fixation with distal transverse interlocking screw. Fluoro time is 53 seconds. IMPRESSION: Intraoperative fluoroscopy of right femur intramedullary nail fixation. Fluoro time 53 seconds. Electronically Signed   By: Kristine Garbe M.D.   On: 06/13/2017 20:02   Dg Hip Unilat W Or Wo Pelvis 2-3 Views Right  Result Date: 06/12/2017 CLINICAL DATA:  Right hip pain.  Fall today. EXAM: DG HIP (WITH OR WITHOUT PELVIS) 2-3V RIGHT COMPARISON:  None. FINDINGS: A nondisplaced intertrochanteric fracture is present. The hip is not well visualized on the lateral view. Pelvis is otherwise intact. Left hip is unremarkable. Vascular calcifications are present. IMPRESSION: Nondisplaced right intertrochanteric fracture. Detail is limited. CT could be used for or further detail if clinically indicated. Electronically Signed   By: San Morelle M.D.   On: 06/12/2017 16:35       Today   Subjective:   Walter [name] little drowsy otherwise denies any pain o Objective:   Blood pressure 129/64, pulse 77, temperature 97.9 F (36.6 C), temperature source Oral, resp. rate 18, height 5\' 10"  (1.778 m), weight 80.3 kg (177 lb), SpO2 95 %.  .  Intake/Output Summary (Last 24 hours) at 06/16/2017 1025 Last data filed at 06/16/2017 1002 Gross per 24 hour  Intake 240 ml  Output 790 ml  Net -550 ml    Exam VITAL SIGNS: Blood pressure 129/64, pulse 77, temperature 97.9 F (36.6 C), temperature source Oral, resp. rate 18, height 5\' 10"  (1.778 m),  weight 80.3 kg (177 lb), SpO2 95 %.  GENERAL:  84 y.o.-year-old patient lying in the bed with no acute distress.  EYES: Pupils equal, round, reactive to light and accommodation. No scleral icterus. Extraocular muscles intact.  HEENT: Head atraumatic, normocephalic. Oropharynx and nasopharynx clear.  NECK:  Supple, no jugular venous distention. No thyroid enlargement, no tenderness.  LUNGS: Normal breath sounds bilaterally, no wheezing, rales,rhonchi or crepitation. No use of accessory muscles of respiration.  CARDIOVASCULAR: S1, S2 normal. No murmurs, rubs, or gallops.  ABDOMEN: Soft, nontender, nondistended. Bowel sounds present. No organomegaly or mass.  EXTREMITIES: No pedal edema, cyanosis, or clubbing.  NEUROLOGIC: Cranial nerves II through XII are intact. Muscle strength 5/5 in all extremities. Sensation intact. Gait not checked.  PSYCHIATRIC: The patient is alert and oriented x 3.  SKIN: No obvious rash, lesion, or ulcer.   Data Review     CBC w Diff:  Lab Results  Component Value Date   WBC 10.0 06/15/2017   HGB 13.1 06/15/2017   HCT 37.7 (L) 06/15/2017   PLT 201 06/15/2017   LYMPHOPCT 14 06/12/2017   MONOPCT 9 06/12/2017   EOSPCT 1 06/12/2017   BASOPCT 0 06/12/2017   CMP:  Lab Results  Component Value Date   NA 141 06/15/2017   K 3.9 06/15/2017   CL 106 06/15/2017   CO2 31 06/15/2017   BUN 23 (H) 06/15/2017   CREATININE 1.31 (H) 06/15/2017   PROT 6.5 03/10/2017   ALBUMIN 3.8 03/10/2017   BILITOT 0.8 03/10/2017   ALKPHOS 69 03/10/2017   AST 18 03/10/2017   ALT 6 (L) 03/10/2017  .  Micro Results Recent Results (from the past 240 hour(s))  Surgical pcr screen     Status: None   Collection Time: 06/13/17  6:16 AM  Result Value Ref Range Status   MRSA, PCR NEGATIVE NEGATIVE Final   Staphylococcus aureus NEGATIVE NEGATIVE Final    Comment: (NOTE) The Xpert SA Assay (FDA approved for NASAL specimens in patients 56 years of age and older), is one component of  a comprehensive surveillance program. It is not intended to diagnose infection nor to guide or monitor treatment. Performed at [facility], [address] [place], [place] [address]         Code Status Orders  (From admission, onward)        Start     Ordered   06/12/17 2001  Do not attempt resuscitation (DNR)  Continuous    Question Answer Comment  In the event of cardiac or respiratory ARREST Do not call a "code blue"   In the event of cardiac or respiratory ARREST Do not perform Intubation, CPR, defibrillation or ACLS   In the event of cardiac or respiratory ARREST Use medication by any route, position, wound care, and other measures to relive pain and suffering. May use oxygen, suction and manual treatment of airway obstruction as needed for comfort.      06/12/17 2000    Code Status History    Date Active Date Inactive Code Status Order ID Comments User Context   03/03/2016 2223 03/05/2016 1837 Full Code [id number]  Lance Coon, MD Inpatient    Advance Directive Documentation     Most Recent Value  Type of Advance Directive  Healthcare Power of Attorney, Living will  Pre-existing out of facility DNR order (yellow form or pink MOST form)  -  "MOST" Form in Place?  -           Contact information for follow-up providers    Hessie Knows, MD Follow up in 6 week(s).   Specialty:  Orthopedic Surgery Contact information: [place] [address] [phone]            Contact information for after-discharge care    Destination    [facility] .   Service:  Skilled Nursing Contact information: [place] [phone]                  Discharge Medications   Allergies as of 06/16/2017      Reactions   Quinolones    tendonitis      Medication List    STOP taking these medications   amLODipine 5 MG tablet Commonly known as:  NORVASC      TAKE these medications   acetaminophen 325 MG tablet Commonly known as:  TYLENOL Take 2 tablets (650 mg total) by mouth every 6 (six) hours as needed for mild pain or moderate pain (or Fever >/= 101).   carbidopa-levodopa 25-100 MG tablet Commonly known as:  SINEMET IR TAKE 1 TABLET THREE TIMES DAILY   docusate sodium 100 MG capsule Commonly known as:  COLACE Take 1 capsule (100 mg total) by mouth 2 (two) times daily.   ELIQUIS 5 MG Tabs tablet Generic drug:  apixaban TAKE 1 TABLET TWICE DAILY   HYDROcodone-acetaminophen 7.5-325 mg/15 ml solution Commonly known as:  HYCET Take 5-10 mLs by mouth every 4 (four) hours as needed for moderate pain.   levothyroxine 112 MCG tablet Commonly known as:  SYNTHROID, LEVOTHROID TAKE 1 TABLET EVERY DAY BEFORE BREAKFAST   polyethylene glycol packet Commonly known as:  MIRALAX / GLYCOLAX Take 17 g by mouth daily.   traZODone 50 MG tablet Commonly known as:  DESYREL Take 0.5 tablets (25 mg total) by mouth at bedtime as needed for sleep.   vitamin B-12 1000 MCG tablet Commonly known as:  CYANOCOBALAMIN Take 1,000 mcg by mouth daily.   Vitamin D 2000 units Caps Take 2,000 Units by mouth daily.          Total Time in preparing paper work, data evaluation and todays exam - 81 minutes  Dustin Flock M.D on 06/16/2017 at 10:25 AM [place]  [phone]

## 2017-06-16 NOTE — Progress Notes (Signed)
Patient is medically stable for D/C to [facility] today. Per Seth Bake admissions coordinator at [facility] authorization has been received and patient can come today to room 217. RN will call report at [phone] and arrange EMS for transport. Clinical Education officer, museum (CSW) sent D/C orders to [facility] via [place]. Patient is aware of above. Patient's wife [name] and daughter Erline Levine were at bedside and are aware of above. Please reconsult if future social work needs arise. CSW signing off.   [employer], LCSW [phone]

## 2017-06-16 NOTE — Progress Notes (Signed)
Physical Therapy Treatment Patient Details Name: Walter [name] MRN: [id number] DOB: 01/06/1932 Today's Date: 06/16/2017    History of Present Illness Pt is an 84 y.o. male with a known history of prostate hypertrophy, DVT of lower extremity on oral Eliquis for anticoagulation, hypertension, hypothyroidism, and Parkinson's disease who lost balance and fell on the sidewalk.Marland Kitchen He landed on his right hip and has an abrasion injury over the right wrist area.  Pt diagnosed with R hip intertrochanteric fracture.  Pt is now s/p R hip IM nail.  PMH includes above as well as: ARF after L TKR, OA, R toe amputation, and oral cancer.       PT Comments    Pt continues to present with significant deficits in strength, transfers, mobility, gait, balance, and activity tolerance.  Pt required extensive assistance with both bed mobility and transfers and once in standing required constant assistance to prevent posterior LOB.  Pt was able to take 2-3 very small steps at the EOB with heavy +2 assistance and remains antalgic on the RLE to the point where advancing the LLE is done with small, short hops or by shuffling.  Pt will benefit from PT services in a SNF setting upon discharge to safely address above deficits for decreased caregiver assistance and eventual return to PLOF.     Follow Up Recommendations  SNF     Equipment Recommendations       Recommendations for Other Services       Precautions / Restrictions Precautions Precautions: Fall Restrictions Weight Bearing Restrictions: Yes RLE Weight Bearing: Weight bearing as tolerated    Mobility  Bed Mobility Overal bed mobility: Needs Assistance Bed Mobility: Sit to Supine;Supine to Sit;Rolling Rolling: Mod assist;+2 for physical assistance   Supine to sit: +2 for physical assistance;Max assist Sit to supine: Max assist;+2 for physical assistance   General bed mobility comments: Pt required near total assist with bed mobility tasks with very little  participation from patient provided despite max verbal and tactile cues for sequencing.   Transfers Overall transfer level: Needs assistance Equipment used: Rolling walker (2 wheeled) Transfers: Sit to/from Stand Sit to Stand: Mod assist;+2 physical assistance;From elevated surface         General transfer comment: Pt able to stand at EOB this session with heavy +2 assist to stand and then to prevent posterior LOB while in standing  Ambulation/Gait Ambulation/Gait assistance: Mod assist;+2 physical assistance Ambulation Distance (Feet): 2 Feet Assistive device: Rolling walker (2 wheeled) Gait Pattern/deviations: Step-to pattern;Decreased step length - left;Decreased stance time - right;Antalgic     General Gait Details: Slightly increased ability to advance BLEs this session but pt remains very antalgic on the RLE and was only able to advance LLE several inches   Stairs             Wheelchair Mobility    Modified Rankin (Stroke Patients Only)       Balance Overall balance assessment: Needs assistance Sitting-balance support: Feet supported;Bilateral upper extremity supported Sitting balance-Leahy Scale: Fair Sitting balance - Comments: No posterior lean this session   Standing balance support: Bilateral upper extremity supported Standing balance-Leahy Scale: Poor                              Cognition Arousal/Alertness: Awake/alert Behavior During Therapy: WFL for tasks assessed/performed Overall Cognitive Status: Within Functional Limits for tasks assessed  Exercises Total Joint Exercises Ankle Circles/Pumps: AROM;Both;5 reps;10 reps Quad Sets: Strengthening;Both;5 reps;10 reps Gluteal Sets: Strengthening;Both;10 reps;5 reps Heel Slides: AAROM;Left;5 reps Hip ABduction/ADduction: AAROM;Both;10 reps Straight Leg Raises: AAROM;Both;10 reps Long Arc Quad: AROM;Both;10 reps Knee Flexion:  AROM;Both;10 reps    General Comments        Pertinent Vitals/Pain Pain Assessment: 0-10 Pain Score: 7  Pain Location: R hip Pain Descriptors / Indicators: Operative site guarding;Sore Pain Intervention(s): Premedicated before session;Monitored during session;Limited activity within patient's tolerance    Home Living                      Prior Function            PT Goals (current goals can now be found in the care plan section) Progress towards PT goals: Not progressing toward goals - comment(Pt remains very limited functionally secondary to weakness and RLE pain)    Frequency    BID      PT Plan Current plan remains appropriate    Co-evaluation              AM-PAC PT "6 Clicks" Daily Activity  Outcome Measure                   End of Session Equipment Utilized During Treatment: Gait belt Activity Tolerance: Patient limited by pain Patient left: in bed;with bed alarm set;with call bell/phone within reach;with SCD's reapplied Nurse Communication: Mobility status PT Visit Diagnosis: Muscle weakness (generalized) (M62.81);Other abnormalities of gait and mobility (R26.89)     Time: 1100-1130 PT Time Calculation (min) (ACUTE ONLY): 30 min  Charges:  $Therapeutic Exercise: 8-22 mins $Therapeutic Activity: 8-22 mins                    G Codes:       DRoyetta Asal PT, DPT 06/16/17, 12:17 PM

## 2017-06-16 NOTE — Progress Notes (Signed)
Report called to [facility]. Report given to [facility]. EMS called for transportation.

## 2017-06-16 NOTE — Clinical Social Work Placement (Signed)
   CLINICAL SOCIAL WORK PLACEMENT  NOTE  Date:  06/16/2017  Patient Details  Name: Walter [name] MRN: [id number] Date of Birth: 12-12-1931  Clinical Social Work is seeking post-discharge placement for this patient at the [place] level of care (*CSW will initial, date and re-position this form in  chart as items are completed):  Yes   Patient/family provided with [facility] Work Department's list of facilities offering this level of care within the geographic area requested by the patient (or if unable, by the patient's family).  Yes   Patient/family informed of their freedom to choose among providers that offer the needed level of care, that participate in Medicare, Medicaid or managed care program needed by the patient, have an available bed and are willing to accept the patient.  Yes   Patient/family informed of [facility]'s ownership interest in [facility] and [facility], as well as of the fact that they are under no obligation to receive care at these facilities.  PASRR submitted to EDS on 06/14/17     PASRR number received on 06/14/17     Existing PASRR number confirmed on       FL2 transmitted to all facilities in geographic area requested by pt/family on 06/14/17     FL2 transmitted to all facilities within larger geographic area on       Patient informed that his/her managed care company has contracts with or will negotiate with certain facilities, including the following:        Yes   Patient/family informed of bed offers received.  Patient chooses bed at [facility] )     Physician recommends and patient chooses bed at      Patient to be transferred to [facility] ) on 06/16/17.  Patient to be transferred to facility by [facility] )     Patient family notified on 06/16/17 of transfer.  Name of family member notified:  (Patient's wife [name] and daughter Erline Levine were at bedside and aware of D/C today. )      PHYSICIAN       Additional Comment:    _______________________________________________ Unique Searfoss, Veronia Beets, LCSW 06/16/2017, 1:49 PM

## 2017-06-16 NOTE — Progress Notes (Signed)
   Subjective: 3 Days Post-Op Procedure(s) (LRB): INTRAMEDULLARY (IM) NAIL INTERTROCHANTRIC (Right) Patient reports pain as mild.   Patient is well, and has had no acute complaints or problems Not progressing well with physical therapy. Patient was placed on oxygen last night Plan is to go Rehab after hospital stay. no nausea and no vomiting Patient denies any chest pains or shortness of breath. Objective: Vital signs in last 24 hours: Temp:  [97.9 F (36.6 C)-100.9 F (38.3 C)] 97.9 F (36.6 C) (04/19 0742) Pulse Rate:  [77-90] 77 (04/19 0742) Resp:  [18] 18 (04/19 0742) BP: (112-143)/(52-72) 129/64 (04/19 0742) SpO2:  [86 %-95 %] 95 % (04/19 0742) well approximated incision Heels are non tender and elevated off the bed using rolled towels Intake/Output from previous day: 04/18 0701 - 04/19 0700 In: -  Out: 790 [Urine:790] Intake/Output this shift: No intake/output data recorded.  Recent Labs    06/14/17 0351 06/15/17 0430  HGB 13.3 13.1   Recent Labs    06/14/17 0351 06/15/17 0430  WBC 9.3 10.0  RBC 4.25* 4.15*  HCT 38.7* 37.7*  PLT 195 201   Recent Labs    06/14/17 0351 06/15/17 0430  NA 140 141  K 4.2 3.9  CL 107 106  CO2 26 31  BUN 23* 23*  CREATININE 1.42* 1.31*  GLUCOSE 160* 122*  CALCIUM 8.1* 8.2*   No results for input(s): LABPT, INR in the last 72 hours.  EXAM General - Patient is Alert, Appropriate and Oriented Extremity - Neurologically intact Neurovascular intact Sensation intact distally Intact pulses distally Dorsiflexion/Plantar flexion intact No cellulitis present Compartment soft Dressing - dressing C/D/I Motor Function - intact, moving foot and toes well on exam.    Past Medical History:  Diagnosis Date  . ARF (acute renal failure) ([place]) 2011   after TKR  . BPH (benign prostatic hypertrophy)   . DVT (deep venous thrombosis) ([place]) 1998   after left knee arthroscopy  . Hx of colonoscopy 2000  . Hypertension   .  Hypothyroid 2007   postop from thyroidectomy (cancer scare)  . Kidney stones    recurrent  . Oral cancer ([place]) 2004   graft from left thigh  . Osteoarthritis of both knees   . Parkinson's disease ([place])   . Tendonitis 2012   from quinolone  . Toe amputation status ([place]) 2010    Assessment/Plan: 3 Days Post-Op Procedure(s) (LRB): INTRAMEDULLARY (IM) NAIL INTERTROCHANTRIC (Right) Active Problems:   Hip fracture (HCC)  Estimated body mass index is 25.4 kg/m as calculated from the following:   Height as of this encounter: 5\' 10"  (1.778 m).   Weight as of this encounter: 80.3 kg (177 lb). Up with therapy Discharge to SNF when cleared by medicine  Labs: None DVT Prophylaxis - Foot Pumps, TED hose and Eliquis Weight-Bearing as tolerated to right leg D/C O2 and Pulse OX and try on Room Air Change dressing as needed Will need to follow-up in [facility] in 6 weeks  Vergene Marland R. [place] 06/16/2017, 9:23 AM

## 2017-06-19 DIAGNOSIS — S72001A Fracture of unspecified part of neck of right femur, initial encounter for closed fracture: Secondary | ICD-10-CM | POA: Diagnosis not present

## 2017-06-19 DIAGNOSIS — N400 Enlarged prostate without lower urinary tract symptoms: Secondary | ICD-10-CM | POA: Diagnosis not present

## 2017-06-19 DIAGNOSIS — I10 Essential (primary) hypertension: Secondary | ICD-10-CM | POA: Diagnosis not present

## 2017-06-19 DIAGNOSIS — I2692 Saddle embolus of pulmonary artery without acute cor pulmonale: Secondary | ICD-10-CM | POA: Diagnosis not present

## 2017-06-19 DIAGNOSIS — G20 Parkinson's disease: Secondary | ICD-10-CM | POA: Diagnosis not present

## 2017-06-21 NOTE — Anesthesia Postprocedure Evaluation (Signed)
Anesthesia Post Note  Patient: Walter [name]  Procedure(s) Performed: INTRAMEDULLARY (IM) NAIL INTERTROCHANTRIC (Right Hip)  Patient location during evaluation: PACU Anesthesia Type: General Level of consciousness: awake and alert and oriented Pain management: pain level controlled Vital Signs Assessment: post-procedure vital signs reviewed and stable Respiratory status: spontaneous breathing Cardiovascular status: blood pressure returned to baseline Anesthetic complications: no     Last Vitals:  Vitals:   06/16/17 1208 06/16/17 1431  BP:  (!) 122/56  Pulse: 77 79  Resp:  18  Temp:    SpO2: 94% 94%    Last Pain:  Vitals:   06/16/17 1102  TempSrc:   PainSc: 4                  Kerby Hockley

## 2017-07-05 DIAGNOSIS — R05 Cough: Secondary | ICD-10-CM | POA: Diagnosis not present

## 2017-07-06 DIAGNOSIS — R05 Cough: Secondary | ICD-10-CM | POA: Diagnosis not present

## 2017-07-22 DIAGNOSIS — N183 Chronic kidney disease, stage 3 (moderate): Secondary | ICD-10-CM | POA: Diagnosis not present

## 2017-07-22 DIAGNOSIS — R350 Frequency of micturition: Secondary | ICD-10-CM | POA: Diagnosis not present

## 2017-07-22 DIAGNOSIS — I2692 Saddle embolus of pulmonary artery without acute cor pulmonale: Secondary | ICD-10-CM | POA: Diagnosis not present

## 2017-07-22 DIAGNOSIS — I129 Hypertensive chronic kidney disease with stage 1 through stage 4 chronic kidney disease, or unspecified chronic kidney disease: Secondary | ICD-10-CM | POA: Diagnosis not present

## 2017-07-22 DIAGNOSIS — N281 Cyst of kidney, acquired: Secondary | ICD-10-CM | POA: Diagnosis not present

## 2017-07-22 DIAGNOSIS — S72141D Displaced intertrochanteric fracture of right femur, subsequent encounter for closed fracture with routine healing: Secondary | ICD-10-CM | POA: Diagnosis not present

## 2017-07-22 DIAGNOSIS — S72001D Fracture of unspecified part of neck of right femur, subsequent encounter for closed fracture with routine healing: Secondary | ICD-10-CM | POA: Diagnosis not present

## 2017-07-22 DIAGNOSIS — G20 Parkinson's disease: Secondary | ICD-10-CM | POA: Diagnosis not present

## 2017-07-22 DIAGNOSIS — N401 Enlarged prostate with lower urinary tract symptoms: Secondary | ICD-10-CM | POA: Diagnosis not present

## 2017-07-25 ENCOUNTER — Other Ambulatory Visit: Payer: Self-pay | Admitting: Internal Medicine

## 2017-07-25 ENCOUNTER — Encounter: Payer: Self-pay | Admitting: Internal Medicine

## 2017-07-25 ENCOUNTER — Telehealth: Payer: Self-pay | Admitting: Internal Medicine

## 2017-07-25 ENCOUNTER — Ambulatory Visit (INDEPENDENT_AMBULATORY_CARE_PROVIDER_SITE_OTHER): Payer: Medicare HMO | Admitting: Internal Medicine

## 2017-07-25 VITALS — BP 118/70 | HR 80 | Temp 98.1°F | Ht 70.0 in | Wt 166.0 lb

## 2017-07-25 DIAGNOSIS — G20 Parkinson's disease: Secondary | ICD-10-CM | POA: Diagnosis not present

## 2017-07-25 DIAGNOSIS — S72001D Fracture of unspecified part of neck of right femur, subsequent encounter for closed fracture with routine healing: Secondary | ICD-10-CM

## 2017-07-25 DIAGNOSIS — R310 Gross hematuria: Secondary | ICD-10-CM | POA: Diagnosis not present

## 2017-07-25 DIAGNOSIS — S98131A Complete traumatic amputation of one right lesser toe, initial encounter: Secondary | ICD-10-CM | POA: Diagnosis not present

## 2017-07-25 DIAGNOSIS — R319 Hematuria, unspecified: Secondary | ICD-10-CM | POA: Insufficient documentation

## 2017-07-25 NOTE — Progress Notes (Signed)
Subjective:    Patient ID: Walter [name], male    DOB: 08-01-1931, 84 y.o.   MRN: [id number]  HPI Here for follow up after hip fracture and rehab stay With wife  Got home from rehab 1 week ago Biggest problems is sleeping through the night Falls asleep okay--then awakens after a few hours (nocturia and incontinence). Wears pull up also Needs to move to recliner to sleep again  Still weak Having home PT and OT starting soon Walks  with walker---may need indefinitely Aide coming daily for 90 minutes to help with AM care Aide for 1 hour close to bedtime Wife does the instrumental ADLs Wife has been up at night with him---affecting her  Did have hematuria once upon first getting to rehab Did have treatment for possible infection No recurrence since then--but has had hematuria intermittently for a while Her prefers not to look into this  They ask about a condom catheter---okay to try Tamsulosin in past--now trouble swallowing  Current Outpatient Medications on File Prior to Visit  Medication Sig Dispense Refill  . acetaminophen (TYLENOL) 325 MG tablet Take 2 tablets (650 mg total) by mouth every 6 (six) hours as needed for mild pain or moderate pain (or Fever >/= 101).    . carbidopa-levodopa (SINEMET IR) 25-100 MG tablet TAKE 1 TABLET THREE TIMES DAILY 270 tablet 3  . Cholecalciferol (VITAMIN D) 2000 UNITS CAPS Take 2,000 Units by mouth daily.     Marland Kitchen docusate sodium (COLACE) 100 MG capsule Take 1 capsule (100 mg total) by mouth 2 (two) times daily. 10 capsule 0  . ELIQUIS 5 MG TABS tablet TAKE 1 TABLET TWICE DAILY 180 tablet 11  . HYDROcodone-acetaminophen (HYCET) 7.5-325 mg/15 ml solution Take 5-10 mLs by mouth every 4 (four) hours as needed for moderate pain. 60 mL 0  . levothyroxine (SYNTHROID, LEVOTHROID) 112 MCG tablet TAKE 1 TABLET EVERY DAY BEFORE BREAKFAST 90 tablet 3  . polyethylene glycol (MIRALAX / GLYCOLAX) packet Take 17 g by mouth daily.     . vitamin B-12  (CYANOCOBALAMIN) 1000 MCG tablet Take 1,000 mcg by mouth daily.     No current facility-administered medications on file prior to visit.     Allergies  Allergen Reactions  . Quinolones     tendonitis    Past Medical History:  Diagnosis Date  . ARF (acute renal failure) ([place]) 2011   after TKR  . BPH (benign prostatic hypertrophy)   . DVT (deep venous thrombosis) ([place]) 1998   after left knee arthroscopy  . Hx of colonoscopy 2000  . Hypertension   . Hypothyroid 2007   postop from thyroidectomy (cancer scare)  . Kidney stones    recurrent  . Oral cancer ([place]) 2004   graft from left thigh  . Osteoarthritis of both knees   . Parkinson's disease ([place])   . Tendonitis 2012   from quinolone  . Toe amputation status ([place]) 2010    Past Surgical History:  Procedure Laterality Date  . APPENDECTOMY    . CHOLECYSTECTOMY    . HERNIA REPAIR    . INTRAMEDULLARY (IM) NAIL INTERTROCHANTERIC Right 06/13/2017   Procedure: INTRAMEDULLARY (IM) NAIL INTERTROCHANTRIC;  Surgeon: Hessie Knows, MD;  Location: [facility] ORS;  Service: Orthopedics;  Laterality: Right;  . LITHOTRIPSY  2009/2010   then stent and cystoscopic removal 2014  . Oral cancer removed Right 2004  . REPAIR ZENKER'S DIVERTICULA  2013  . THYROIDECTOMY  2007  . TOE AMPUTATION Right 2010   badly  overlapping other toe  . TOTAL KNEE ARTHROPLASTY Left   . TRANSURETHRAL RESECTION OF PROSTATE  2011  . UMBILICAL HERNIA REPAIR  2007    Family History  Problem Relation Age of Onset  . Dementia Mother   . Stroke Father   . Pulmonary disease Brother   . Heart disease Neg Hx   . Diabetes Neg Hx     Social History   Socioeconomic History  . Marital status: Married    Spouse name: Not on file  . Number of children: 3  . Years of education: Not on file  . Highest education level: Not on file  Occupational History  . 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 Financial resource strain: Not on file  . Food insecurity:    Worry: Not on file    Inability: Not on file  . Transportation needs:    Medical: Not on file    Non-medical: Not on file  Tobacco Use  . Smoking status: Former Smoker    Types: Cigarettes    Last attempt to quit: 04/27/2002    Years since quitting: 15.2  . Smokeless tobacco: Never Used  Substance and Sexual Activity  . Alcohol use: Yes    Alcohol/week: 0.0 oz    Comment: one drink daily (wine or scotch)  . Drug use: No  . Sexual activity: Not on file  Lifestyle  . Physical activity:    Days per week: Not on file    Minutes per session: Not on file  . Stress: Not on file  Relationships  . Social connections:    Talks on phone: Not on file    Gets together: Not on file    Attends religious service: Not on file    Active member of club or organization: Not on file    Attends meetings of clubs or organizations: Not on file    Relationship status: Not on file  . Intimate partner violence:    Fear of current or ex partner: Not on file    Emotionally abused: Not on file    Physically abused: Not on file    Forced sexual activity: Not on file  Other Topics Concern  . Not on file  Social History Narrative   Son works for [employer]   1 daughter in [place], other in [place] living will   Wife, then son, would be health care POA   He isn't sure about DNR--will accept resuscitation for now   No tube feeds if cognitively unaware   Review of Systems Bowels are soft and somewhat loose---once a day mostly. miralax has been helping him Appetite is okay Did lose weight with the hip fracture Feet okay---no sores at amputation site.  No chest pain No SOB    Objective:   Physical Exam  Constitutional: He appears well-developed. No distress.  Neck: No thyromegaly present.  Cardiovascular: Normal rate, regular rhythm and normal heart sounds. Exam reveals no gallop.  No murmur heard. Respiratory: Effort  normal. No respiratory distress. He has no wheezes. He has no rales.  Decreased breath sounds but clear  GI: Soft. There is no tenderness.  Musculoskeletal:  Trace edema No calf tenderness  Lymphadenopathy:    He has no cervical adenopathy.           Assessment & Plan:

## 2017-07-25 NOTE — Assessment & Plan Note (Signed)
No problems with this 

## 2017-07-25 NOTE — Assessment & Plan Note (Signed)
Ongoing disability from this

## 2017-07-25 NOTE — Telephone Encounter (Signed)
Copied from [place] [phone]. Topic: Quick Communication - Rx Refill/Question >> Jul 25, 2017  1:54 PM Oliver Pila B wrote: Medication: HYDROcodone-acetaminophen (HYCET) 7.5-325 mg/15 ml solution [[id number]]   Has the patient contacted their pharmacy? Yes.   (Agent: If no, request that the patient contact the pharmacy for the refill.) (Agent: If yes, when and what did the pharmacy advise?)  Preferred Pharmacy (with phone number or street name[facility] [address]  Agent: Please be advised that RX refills may take up to 3 business days. We ask that you follow-up with your pharmacy.

## 2017-07-25 NOTE — Telephone Encounter (Signed)
Verbal orders given to [place]

## 2017-07-25 NOTE — Telephone Encounter (Signed)
Copied from [place] [phone]. Topic: General - Other >> Jul 25, 2017  9:58 AM Margot Ables wrote: Reason for CRM: requesting VO for home PT 2x week for 4 weeks. Secure VM if not able to answer. Please advise.

## 2017-07-25 NOTE — Assessment & Plan Note (Signed)
Intermittent and goes back some time Prefers not to see urologist about this

## 2017-07-25 NOTE — Assessment & Plan Note (Signed)
Recovering but still needs a lot of help Home PT/OT Aides may need to continue for long term

## 2017-07-25 NOTE — Telephone Encounter (Signed)
That is fine 

## 2017-07-26 DIAGNOSIS — N401 Enlarged prostate with lower urinary tract symptoms: Secondary | ICD-10-CM | POA: Diagnosis not present

## 2017-07-26 DIAGNOSIS — N183 Chronic kidney disease, stage 3 (moderate): Secondary | ICD-10-CM | POA: Diagnosis not present

## 2017-07-26 DIAGNOSIS — N281 Cyst of kidney, acquired: Secondary | ICD-10-CM | POA: Diagnosis not present

## 2017-07-26 DIAGNOSIS — I2692 Saddle embolus of pulmonary artery without acute cor pulmonale: Secondary | ICD-10-CM | POA: Diagnosis not present

## 2017-07-26 DIAGNOSIS — I129 Hypertensive chronic kidney disease with stage 1 through stage 4 chronic kidney disease, or unspecified chronic kidney disease: Secondary | ICD-10-CM | POA: Diagnosis not present

## 2017-07-26 DIAGNOSIS — S72001D Fracture of unspecified part of neck of right femur, subsequent encounter for closed fracture with routine healing: Secondary | ICD-10-CM | POA: Diagnosis not present

## 2017-07-26 DIAGNOSIS — S72141D Displaced intertrochanteric fracture of right femur, subsequent encounter for closed fracture with routine healing: Secondary | ICD-10-CM | POA: Diagnosis not present

## 2017-07-26 DIAGNOSIS — G20 Parkinson's disease: Secondary | ICD-10-CM | POA: Diagnosis not present

## 2017-07-26 DIAGNOSIS — R350 Frequency of micturition: Secondary | ICD-10-CM | POA: Diagnosis not present

## 2017-07-26 MED ORDER — HYDROCODONE-ACETAMINOPHEN 7.5-325 MG/15ML PO SOLN: 5.0000 mL | ORAL | 0 refills | Status: DC | PRN

## 2017-07-26 NOTE — Telephone Encounter (Signed)
Let them know I sent the refill

## 2017-07-26 NOTE — Telephone Encounter (Signed)
Req. refill on Hydrocodone-Acetaminophen 7.5-325 mg/ 15 ml solution; last refill 06/15/17; #60 ml.; 0 refills (ordered by Orthopedics)  S/P ORIF right hip 06/13/17  Last office visit 07/25/17; hosp. F/u  [facility] [address]; [name]

## 2017-07-26 NOTE — Telephone Encounter (Signed)
Spoke to pt

## 2017-07-27 DIAGNOSIS — S72141D Displaced intertrochanteric fracture of right femur, subsequent encounter for closed fracture with routine healing: Secondary | ICD-10-CM | POA: Diagnosis not present

## 2017-07-27 DIAGNOSIS — N183 Chronic kidney disease, stage 3 (moderate): Secondary | ICD-10-CM | POA: Diagnosis not present

## 2017-07-27 DIAGNOSIS — R350 Frequency of micturition: Secondary | ICD-10-CM | POA: Diagnosis not present

## 2017-07-27 DIAGNOSIS — I129 Hypertensive chronic kidney disease with stage 1 through stage 4 chronic kidney disease, or unspecified chronic kidney disease: Secondary | ICD-10-CM | POA: Diagnosis not present

## 2017-07-27 DIAGNOSIS — S72001D Fracture of unspecified part of neck of right femur, subsequent encounter for closed fracture with routine healing: Secondary | ICD-10-CM | POA: Diagnosis not present

## 2017-07-27 DIAGNOSIS — G20 Parkinson's disease: Secondary | ICD-10-CM | POA: Diagnosis not present

## 2017-07-27 DIAGNOSIS — N281 Cyst of kidney, acquired: Secondary | ICD-10-CM | POA: Diagnosis not present

## 2017-07-27 DIAGNOSIS — N401 Enlarged prostate with lower urinary tract symptoms: Secondary | ICD-10-CM | POA: Diagnosis not present

## 2017-07-27 DIAGNOSIS — I2692 Saddle embolus of pulmonary artery without acute cor pulmonale: Secondary | ICD-10-CM | POA: Diagnosis not present

## 2017-07-28 DIAGNOSIS — S72141D Displaced intertrochanteric fracture of right femur, subsequent encounter for closed fracture with routine healing: Secondary | ICD-10-CM | POA: Diagnosis not present

## 2017-07-28 DIAGNOSIS — I129 Hypertensive chronic kidney disease with stage 1 through stage 4 chronic kidney disease, or unspecified chronic kidney disease: Secondary | ICD-10-CM | POA: Diagnosis not present

## 2017-07-28 DIAGNOSIS — Z8781 Personal history of (healed) traumatic fracture: Secondary | ICD-10-CM | POA: Diagnosis not present

## 2017-07-28 DIAGNOSIS — N183 Chronic kidney disease, stage 3 (moderate): Secondary | ICD-10-CM | POA: Diagnosis not present

## 2017-07-28 DIAGNOSIS — S72001D Fracture of unspecified part of neck of right femur, subsequent encounter for closed fracture with routine healing: Secondary | ICD-10-CM | POA: Diagnosis not present

## 2017-07-28 DIAGNOSIS — Z967 Presence of other bone and tendon implants: Secondary | ICD-10-CM | POA: Diagnosis not present

## 2017-07-28 DIAGNOSIS — R350 Frequency of micturition: Secondary | ICD-10-CM | POA: Diagnosis not present

## 2017-07-28 DIAGNOSIS — N281 Cyst of kidney, acquired: Secondary | ICD-10-CM | POA: Diagnosis not present

## 2017-07-28 DIAGNOSIS — I2692 Saddle embolus of pulmonary artery without acute cor pulmonale: Secondary | ICD-10-CM | POA: Diagnosis not present

## 2017-07-28 DIAGNOSIS — G20 Parkinson's disease: Secondary | ICD-10-CM | POA: Diagnosis not present

## 2017-07-28 DIAGNOSIS — N401 Enlarged prostate with lower urinary tract symptoms: Secondary | ICD-10-CM | POA: Diagnosis not present

## 2017-07-31 DIAGNOSIS — Z96652 Presence of left artificial knee joint: Secondary | ICD-10-CM | POA: Diagnosis not present

## 2017-07-31 DIAGNOSIS — R350 Frequency of micturition: Secondary | ICD-10-CM | POA: Diagnosis not present

## 2017-07-31 DIAGNOSIS — I129 Hypertensive chronic kidney disease with stage 1 through stage 4 chronic kidney disease, or unspecified chronic kidney disease: Secondary | ICD-10-CM | POA: Diagnosis not present

## 2017-07-31 DIAGNOSIS — Z7901 Long term (current) use of anticoagulants: Secondary | ICD-10-CM

## 2017-07-31 DIAGNOSIS — N401 Enlarged prostate with lower urinary tract symptoms: Secondary | ICD-10-CM | POA: Diagnosis not present

## 2017-07-31 DIAGNOSIS — Z87891 Personal history of nicotine dependence: Secondary | ICD-10-CM

## 2017-07-31 DIAGNOSIS — S72141D Displaced intertrochanteric fracture of right femur, subsequent encounter for closed fracture with routine healing: Secondary | ICD-10-CM | POA: Diagnosis not present

## 2017-07-31 DIAGNOSIS — I2692 Saddle embolus of pulmonary artery without acute cor pulmonale: Secondary | ICD-10-CM | POA: Diagnosis not present

## 2017-07-31 DIAGNOSIS — N281 Cyst of kidney, acquired: Secondary | ICD-10-CM | POA: Diagnosis not present

## 2017-07-31 DIAGNOSIS — Z9181 History of falling: Secondary | ICD-10-CM | POA: Diagnosis not present

## 2017-07-31 DIAGNOSIS — N183 Chronic kidney disease, stage 3 (moderate): Secondary | ICD-10-CM | POA: Diagnosis not present

## 2017-07-31 DIAGNOSIS — G20 Parkinson's disease: Secondary | ICD-10-CM | POA: Diagnosis not present

## 2017-07-31 DIAGNOSIS — M170 Bilateral primary osteoarthritis of knee: Secondary | ICD-10-CM | POA: Diagnosis not present

## 2017-07-31 DIAGNOSIS — S72001D Fracture of unspecified part of neck of right femur, subsequent encounter for closed fracture with routine healing: Secondary | ICD-10-CM | POA: Diagnosis not present

## 2017-07-31 DIAGNOSIS — Z89421 Acquired absence of other right toe(s): Secondary | ICD-10-CM

## 2017-08-01 DIAGNOSIS — G20 Parkinson's disease: Secondary | ICD-10-CM | POA: Diagnosis not present

## 2017-08-01 DIAGNOSIS — R350 Frequency of micturition: Secondary | ICD-10-CM | POA: Diagnosis not present

## 2017-08-01 DIAGNOSIS — N281 Cyst of kidney, acquired: Secondary | ICD-10-CM | POA: Diagnosis not present

## 2017-08-01 DIAGNOSIS — I2692 Saddle embolus of pulmonary artery without acute cor pulmonale: Secondary | ICD-10-CM | POA: Diagnosis not present

## 2017-08-01 DIAGNOSIS — S72001D Fracture of unspecified part of neck of right femur, subsequent encounter for closed fracture with routine healing: Secondary | ICD-10-CM | POA: Diagnosis not present

## 2017-08-01 DIAGNOSIS — N183 Chronic kidney disease, stage 3 (moderate): Secondary | ICD-10-CM | POA: Diagnosis not present

## 2017-08-01 DIAGNOSIS — S72141D Displaced intertrochanteric fracture of right femur, subsequent encounter for closed fracture with routine healing: Secondary | ICD-10-CM | POA: Diagnosis not present

## 2017-08-01 DIAGNOSIS — I129 Hypertensive chronic kidney disease with stage 1 through stage 4 chronic kidney disease, or unspecified chronic kidney disease: Secondary | ICD-10-CM | POA: Diagnosis not present

## 2017-08-01 DIAGNOSIS — N401 Enlarged prostate with lower urinary tract symptoms: Secondary | ICD-10-CM | POA: Diagnosis not present

## 2017-08-03 DIAGNOSIS — G20 Parkinson's disease: Secondary | ICD-10-CM | POA: Diagnosis not present

## 2017-08-03 DIAGNOSIS — S72001D Fracture of unspecified part of neck of right femur, subsequent encounter for closed fracture with routine healing: Secondary | ICD-10-CM | POA: Diagnosis not present

## 2017-08-03 DIAGNOSIS — N281 Cyst of kidney, acquired: Secondary | ICD-10-CM | POA: Diagnosis not present

## 2017-08-03 DIAGNOSIS — N183 Chronic kidney disease, stage 3 (moderate): Secondary | ICD-10-CM | POA: Diagnosis not present

## 2017-08-03 DIAGNOSIS — S72141D Displaced intertrochanteric fracture of right femur, subsequent encounter for closed fracture with routine healing: Secondary | ICD-10-CM | POA: Diagnosis not present

## 2017-08-03 DIAGNOSIS — I2692 Saddle embolus of pulmonary artery without acute cor pulmonale: Secondary | ICD-10-CM | POA: Diagnosis not present

## 2017-08-03 DIAGNOSIS — R350 Frequency of micturition: Secondary | ICD-10-CM | POA: Diagnosis not present

## 2017-08-03 DIAGNOSIS — I129 Hypertensive chronic kidney disease with stage 1 through stage 4 chronic kidney disease, or unspecified chronic kidney disease: Secondary | ICD-10-CM | POA: Diagnosis not present

## 2017-08-03 DIAGNOSIS — N401 Enlarged prostate with lower urinary tract symptoms: Secondary | ICD-10-CM | POA: Diagnosis not present

## 2017-08-07 DIAGNOSIS — G20 Parkinson's disease: Secondary | ICD-10-CM | POA: Diagnosis not present

## 2017-08-07 DIAGNOSIS — N281 Cyst of kidney, acquired: Secondary | ICD-10-CM | POA: Diagnosis not present

## 2017-08-07 DIAGNOSIS — S72141D Displaced intertrochanteric fracture of right femur, subsequent encounter for closed fracture with routine healing: Secondary | ICD-10-CM | POA: Diagnosis not present

## 2017-08-07 DIAGNOSIS — I129 Hypertensive chronic kidney disease with stage 1 through stage 4 chronic kidney disease, or unspecified chronic kidney disease: Secondary | ICD-10-CM | POA: Diagnosis not present

## 2017-08-07 DIAGNOSIS — N183 Chronic kidney disease, stage 3 (moderate): Secondary | ICD-10-CM | POA: Diagnosis not present

## 2017-08-07 DIAGNOSIS — S72001D Fracture of unspecified part of neck of right femur, subsequent encounter for closed fracture with routine healing: Secondary | ICD-10-CM | POA: Diagnosis not present

## 2017-08-07 DIAGNOSIS — I2692 Saddle embolus of pulmonary artery without acute cor pulmonale: Secondary | ICD-10-CM | POA: Diagnosis not present

## 2017-08-07 DIAGNOSIS — R350 Frequency of micturition: Secondary | ICD-10-CM | POA: Diagnosis not present

## 2017-08-07 DIAGNOSIS — N401 Enlarged prostate with lower urinary tract symptoms: Secondary | ICD-10-CM | POA: Diagnosis not present

## 2017-08-09 DIAGNOSIS — S72141D Displaced intertrochanteric fracture of right femur, subsequent encounter for closed fracture with routine healing: Secondary | ICD-10-CM | POA: Diagnosis not present

## 2017-08-09 DIAGNOSIS — I2692 Saddle embolus of pulmonary artery without acute cor pulmonale: Secondary | ICD-10-CM | POA: Diagnosis not present

## 2017-08-09 DIAGNOSIS — N183 Chronic kidney disease, stage 3 (moderate): Secondary | ICD-10-CM | POA: Diagnosis not present

## 2017-08-09 DIAGNOSIS — R350 Frequency of micturition: Secondary | ICD-10-CM | POA: Diagnosis not present

## 2017-08-09 DIAGNOSIS — N281 Cyst of kidney, acquired: Secondary | ICD-10-CM | POA: Diagnosis not present

## 2017-08-09 DIAGNOSIS — N401 Enlarged prostate with lower urinary tract symptoms: Secondary | ICD-10-CM | POA: Diagnosis not present

## 2017-08-09 DIAGNOSIS — G20 Parkinson's disease: Secondary | ICD-10-CM | POA: Diagnosis not present

## 2017-08-09 DIAGNOSIS — I129 Hypertensive chronic kidney disease with stage 1 through stage 4 chronic kidney disease, or unspecified chronic kidney disease: Secondary | ICD-10-CM | POA: Diagnosis not present

## 2017-08-09 DIAGNOSIS — S72001D Fracture of unspecified part of neck of right femur, subsequent encounter for closed fracture with routine healing: Secondary | ICD-10-CM | POA: Diagnosis not present

## 2017-08-10 DIAGNOSIS — N183 Chronic kidney disease, stage 3 (moderate): Secondary | ICD-10-CM | POA: Diagnosis not present

## 2017-08-10 DIAGNOSIS — G20 Parkinson's disease: Secondary | ICD-10-CM | POA: Diagnosis not present

## 2017-08-10 DIAGNOSIS — I2692 Saddle embolus of pulmonary artery without acute cor pulmonale: Secondary | ICD-10-CM | POA: Diagnosis not present

## 2017-08-10 DIAGNOSIS — I129 Hypertensive chronic kidney disease with stage 1 through stage 4 chronic kidney disease, or unspecified chronic kidney disease: Secondary | ICD-10-CM | POA: Diagnosis not present

## 2017-08-10 DIAGNOSIS — N281 Cyst of kidney, acquired: Secondary | ICD-10-CM | POA: Diagnosis not present

## 2017-08-10 DIAGNOSIS — N401 Enlarged prostate with lower urinary tract symptoms: Secondary | ICD-10-CM | POA: Diagnosis not present

## 2017-08-10 DIAGNOSIS — S72001D Fracture of unspecified part of neck of right femur, subsequent encounter for closed fracture with routine healing: Secondary | ICD-10-CM | POA: Diagnosis not present

## 2017-08-10 DIAGNOSIS — R350 Frequency of micturition: Secondary | ICD-10-CM | POA: Diagnosis not present

## 2017-08-10 DIAGNOSIS — S72141D Displaced intertrochanteric fracture of right femur, subsequent encounter for closed fracture with routine healing: Secondary | ICD-10-CM | POA: Diagnosis not present

## 2017-08-11 DIAGNOSIS — N281 Cyst of kidney, acquired: Secondary | ICD-10-CM | POA: Diagnosis not present

## 2017-08-11 DIAGNOSIS — G20 Parkinson's disease: Secondary | ICD-10-CM | POA: Diagnosis not present

## 2017-08-11 DIAGNOSIS — I129 Hypertensive chronic kidney disease with stage 1 through stage 4 chronic kidney disease, or unspecified chronic kidney disease: Secondary | ICD-10-CM | POA: Diagnosis not present

## 2017-08-11 DIAGNOSIS — N183 Chronic kidney disease, stage 3 (moderate): Secondary | ICD-10-CM | POA: Diagnosis not present

## 2017-08-11 DIAGNOSIS — R350 Frequency of micturition: Secondary | ICD-10-CM | POA: Diagnosis not present

## 2017-08-11 DIAGNOSIS — S72141D Displaced intertrochanteric fracture of right femur, subsequent encounter for closed fracture with routine healing: Secondary | ICD-10-CM | POA: Diagnosis not present

## 2017-08-11 DIAGNOSIS — S72001D Fracture of unspecified part of neck of right femur, subsequent encounter for closed fracture with routine healing: Secondary | ICD-10-CM | POA: Diagnosis not present

## 2017-08-11 DIAGNOSIS — I2692 Saddle embolus of pulmonary artery without acute cor pulmonale: Secondary | ICD-10-CM | POA: Diagnosis not present

## 2017-08-11 DIAGNOSIS — N401 Enlarged prostate with lower urinary tract symptoms: Secondary | ICD-10-CM | POA: Diagnosis not present

## 2017-08-14 DIAGNOSIS — N281 Cyst of kidney, acquired: Secondary | ICD-10-CM | POA: Diagnosis not present

## 2017-08-14 DIAGNOSIS — I129 Hypertensive chronic kidney disease with stage 1 through stage 4 chronic kidney disease, or unspecified chronic kidney disease: Secondary | ICD-10-CM | POA: Diagnosis not present

## 2017-08-14 DIAGNOSIS — I2692 Saddle embolus of pulmonary artery without acute cor pulmonale: Secondary | ICD-10-CM | POA: Diagnosis not present

## 2017-08-14 DIAGNOSIS — S72001D Fracture of unspecified part of neck of right femur, subsequent encounter for closed fracture with routine healing: Secondary | ICD-10-CM | POA: Diagnosis not present

## 2017-08-14 DIAGNOSIS — S72141D Displaced intertrochanteric fracture of right femur, subsequent encounter for closed fracture with routine healing: Secondary | ICD-10-CM | POA: Diagnosis not present

## 2017-08-14 DIAGNOSIS — R350 Frequency of micturition: Secondary | ICD-10-CM | POA: Diagnosis not present

## 2017-08-14 DIAGNOSIS — N183 Chronic kidney disease, stage 3 (moderate): Secondary | ICD-10-CM | POA: Diagnosis not present

## 2017-08-14 DIAGNOSIS — N401 Enlarged prostate with lower urinary tract symptoms: Secondary | ICD-10-CM | POA: Diagnosis not present

## 2017-08-14 DIAGNOSIS — G20 Parkinson's disease: Secondary | ICD-10-CM | POA: Diagnosis not present

## 2017-08-15 DIAGNOSIS — N281 Cyst of kidney, acquired: Secondary | ICD-10-CM | POA: Diagnosis not present

## 2017-08-15 DIAGNOSIS — R350 Frequency of micturition: Secondary | ICD-10-CM | POA: Diagnosis not present

## 2017-08-15 DIAGNOSIS — I2692 Saddle embolus of pulmonary artery without acute cor pulmonale: Secondary | ICD-10-CM | POA: Diagnosis not present

## 2017-08-15 DIAGNOSIS — N183 Chronic kidney disease, stage 3 (moderate): Secondary | ICD-10-CM | POA: Diagnosis not present

## 2017-08-15 DIAGNOSIS — G20 Parkinson's disease: Secondary | ICD-10-CM | POA: Diagnosis not present

## 2017-08-15 DIAGNOSIS — N401 Enlarged prostate with lower urinary tract symptoms: Secondary | ICD-10-CM | POA: Diagnosis not present

## 2017-08-15 DIAGNOSIS — S72001D Fracture of unspecified part of neck of right femur, subsequent encounter for closed fracture with routine healing: Secondary | ICD-10-CM | POA: Diagnosis not present

## 2017-08-15 DIAGNOSIS — I129 Hypertensive chronic kidney disease with stage 1 through stage 4 chronic kidney disease, or unspecified chronic kidney disease: Secondary | ICD-10-CM | POA: Diagnosis not present

## 2017-08-15 DIAGNOSIS — S72141D Displaced intertrochanteric fracture of right femur, subsequent encounter for closed fracture with routine healing: Secondary | ICD-10-CM | POA: Diagnosis not present

## 2017-08-16 DIAGNOSIS — G20 Parkinson's disease: Secondary | ICD-10-CM | POA: Diagnosis not present

## 2017-08-16 DIAGNOSIS — R350 Frequency of micturition: Secondary | ICD-10-CM | POA: Diagnosis not present

## 2017-08-16 DIAGNOSIS — N281 Cyst of kidney, acquired: Secondary | ICD-10-CM | POA: Diagnosis not present

## 2017-08-16 DIAGNOSIS — N401 Enlarged prostate with lower urinary tract symptoms: Secondary | ICD-10-CM | POA: Diagnosis not present

## 2017-08-16 DIAGNOSIS — S72141D Displaced intertrochanteric fracture of right femur, subsequent encounter for closed fracture with routine healing: Secondary | ICD-10-CM | POA: Diagnosis not present

## 2017-08-16 DIAGNOSIS — I2692 Saddle embolus of pulmonary artery without acute cor pulmonale: Secondary | ICD-10-CM | POA: Diagnosis not present

## 2017-08-16 DIAGNOSIS — S72001D Fracture of unspecified part of neck of right femur, subsequent encounter for closed fracture with routine healing: Secondary | ICD-10-CM | POA: Diagnosis not present

## 2017-08-16 DIAGNOSIS — I129 Hypertensive chronic kidney disease with stage 1 through stage 4 chronic kidney disease, or unspecified chronic kidney disease: Secondary | ICD-10-CM | POA: Diagnosis not present

## 2017-08-16 DIAGNOSIS — N183 Chronic kidney disease, stage 3 (moderate): Secondary | ICD-10-CM | POA: Diagnosis not present

## 2017-08-17 DIAGNOSIS — I129 Hypertensive chronic kidney disease with stage 1 through stage 4 chronic kidney disease, or unspecified chronic kidney disease: Secondary | ICD-10-CM | POA: Diagnosis not present

## 2017-08-17 DIAGNOSIS — N281 Cyst of kidney, acquired: Secondary | ICD-10-CM | POA: Diagnosis not present

## 2017-08-17 DIAGNOSIS — S72001D Fracture of unspecified part of neck of right femur, subsequent encounter for closed fracture with routine healing: Secondary | ICD-10-CM | POA: Diagnosis not present

## 2017-08-17 DIAGNOSIS — I2692 Saddle embolus of pulmonary artery without acute cor pulmonale: Secondary | ICD-10-CM | POA: Diagnosis not present

## 2017-08-17 DIAGNOSIS — N401 Enlarged prostate with lower urinary tract symptoms: Secondary | ICD-10-CM | POA: Diagnosis not present

## 2017-08-17 DIAGNOSIS — S72141D Displaced intertrochanteric fracture of right femur, subsequent encounter for closed fracture with routine healing: Secondary | ICD-10-CM | POA: Diagnosis not present

## 2017-08-17 DIAGNOSIS — G20 Parkinson's disease: Secondary | ICD-10-CM | POA: Diagnosis not present

## 2017-08-17 DIAGNOSIS — N183 Chronic kidney disease, stage 3 (moderate): Secondary | ICD-10-CM | POA: Diagnosis not present

## 2017-08-17 DIAGNOSIS — R350 Frequency of micturition: Secondary | ICD-10-CM | POA: Diagnosis not present

## 2017-08-20 ENCOUNTER — Encounter: Payer: Self-pay | Admitting: Emergency Medicine

## 2017-08-20 ENCOUNTER — Emergency Department: Payer: Medicare HMO

## 2017-08-20 ENCOUNTER — Emergency Department
Admission: EM | Admit: 2017-08-20 | Discharge: 2017-08-20 | Disposition: A | Discharge status: Home / Self Care | Payer: Medicare HMO | Attending: Student in an Organized Health Care Education/Training Program | Admitting: Student in an Organized Health Care Education/Training Program

## 2017-08-20 DIAGNOSIS — Z86711 Personal history of pulmonary embolism: Secondary | ICD-10-CM | POA: Diagnosis not present

## 2017-08-20 DIAGNOSIS — N183 Chronic kidney disease, stage 3 (moderate): Secondary | ICD-10-CM | POA: Insufficient documentation

## 2017-08-20 DIAGNOSIS — Z85819 Personal history of malignant neoplasm of unspecified site of lip, oral cavity, and pharynx: Secondary | ICD-10-CM | POA: Diagnosis not present

## 2017-08-20 DIAGNOSIS — R1310 Dysphagia, unspecified: Secondary | ICD-10-CM | POA: Diagnosis not present

## 2017-08-20 DIAGNOSIS — Z87891 Personal history of nicotine dependence: Secondary | ICD-10-CM | POA: Diagnosis not present

## 2017-08-20 DIAGNOSIS — G20 Parkinson's disease: Secondary | ICD-10-CM | POA: Diagnosis not present

## 2017-08-20 DIAGNOSIS — R0989 Other specified symptoms and signs involving the circulatory and respiratory systems: Secondary | ICD-10-CM | POA: Diagnosis not present

## 2017-08-20 DIAGNOSIS — E039 Hypothyroidism, unspecified: Secondary | ICD-10-CM | POA: Diagnosis not present

## 2017-08-20 DIAGNOSIS — Z79899 Other long term (current) drug therapy: Secondary | ICD-10-CM | POA: Diagnosis not present

## 2017-08-20 DIAGNOSIS — I129 Hypertensive chronic kidney disease with stage 1 through stage 4 chronic kidney disease, or unspecified chronic kidney disease: Secondary | ICD-10-CM | POA: Insufficient documentation

## 2017-08-20 DIAGNOSIS — Z89421 Acquired absence of other right toe(s): Secondary | ICD-10-CM | POA: Insufficient documentation

## 2017-08-20 DIAGNOSIS — Z7901 Long term (current) use of anticoagulants: Secondary | ICD-10-CM | POA: Diagnosis not present

## 2017-08-20 HISTORY — DX: Other pulmonary embolism without acute cor pulmonale: I26.99

## 2017-08-20 LAB — CBC WITH DIFFERENTIAL/PLATELET
Basophils Absolute: 0 10*3/uL (ref 0–0.1)
Basophils Relative: 1 %
Eosinophils Absolute: 0.1 10*3/uL (ref 0–0.7)
Eosinophils Relative: 1 %
HEMATOCRIT: 41.1 % (ref 40.0–52.0)
HEMOGLOBIN: 13.9 g/dL (ref 13.0–18.0)
LYMPHS PCT: 16 %
Lymphs Abs: 1 10*3/uL (ref 1.0–3.6)
MCH: 30.8 pg (ref 26.0–34.0)
MCHC: 33.8 g/dL (ref 32.0–36.0)
MCV: 91 fL (ref 80.0–100.0)
Monocytes Absolute: 0.6 10*3/uL (ref 0.2–1.0)
Monocytes Relative: 9 %
NEUTROS PCT: 73 %
Neutro Abs: 4.8 10*3/uL (ref 1.4–6.5)
Platelets: 247 10*3/uL (ref 150–440)
RBC: 4.52 MIL/uL (ref 4.40–5.90)
RDW: 14.9 % — ABNORMAL HIGH (ref 11.5–14.5)
WBC: 6.5 10*3/uL (ref 3.8–10.6)

## 2017-08-20 LAB — BASIC METABOLIC PANEL
Anion gap: 9 (ref 5–15)
BUN: 22 mg/dL — AB (ref 6–20)
CHLORIDE: 106 mmol/L (ref 101–111)
CO2: 26 mmol/L (ref 22–32)
Calcium: 9 mg/dL (ref 8.9–10.3)
Creatinine, Ser: 1.41 mg/dL — ABNORMAL HIGH (ref 0.61–1.24)
GFR calc Af Amer: 51 mL/min — ABNORMAL LOW (ref >60)
GFR calc non Af Amer: 44 mL/min — ABNORMAL LOW (ref >60)
GLUCOSE: 93 mg/dL (ref 65–99)
POTASSIUM: 3.7 mmol/L (ref 3.5–5.1)
Sodium: 141 mmol/L (ref 135–145)

## 2017-08-20 MED ORDER — RANITIDINE HCL 150 MG/10ML PO SYRP: 150.0000 mg | ORAL_SOLUTION | Freq: Two times a day (BID) | ORAL | 0 refills | Status: DC

## 2017-08-20 MED ORDER — GI COCKTAIL ~~LOC~~
30.0000 mL | Freq: Once | ORAL | Status: AC
  Administered 2017-08-20: 30 mL via ORAL
  Filled 2017-08-20: qty 30

## 2017-08-20 NOTE — ED Notes (Signed)
Pt c/o back/hip pain with laying down. Pt taken off bp cuff and sp02 monitor and moved to the bench at pt request. Dr aware.

## 2017-08-20 NOTE — ED Provider Notes (Signed)
[facility] Emergency Department Provider Note    First MD Initiated Contact with Patient 08/20/17 1223     (approximate)  I have reviewed the triage vital signs and the nursing notes.   HISTORY  Chief Complaint Dysphagia    HPI Walter [name] is a 84 y.o. male with an extensive past medical history presents to the ER with chief complaint of difficulty swallowing and choking sensation since last night after the patient was eating an apple pie with ice cream.  States he ate apple pie and felt like something got stuck in his throat.  He did have several coughing spells where he coughed up thick crust and phlegm.  States he is otherwise feeling well overnight and woke up this morning and tried sips of orange juice but that did not go down well.  He has been able to tolerate sips of water.  Does have a history of Zenker's diverticulum.  He is on Eliquis for history of PE.  Denies any chest pain or shortness of breath.  Wife at bedside states that he has a long history of difficulty swallowing and choking due to history of Parkinson's.    Past Medical History:  Diagnosis Date  . ARF (acute renal failure) ([place]) 2011   after TKR  . BPH (benign prostatic hypertrophy)   . DVT (deep venous thrombosis) ([place]) 1998   after left knee arthroscopy  . Hx of colonoscopy 2000  . Hypertension   . Hypothyroid 2007   postop from thyroidectomy (cancer scare)  . Kidney stones    recurrent  . Oral cancer ([place]) 2004   graft from left thigh  . Osteoarthritis of both knees   . Parkinson's disease ([place])   . Pulmonary embolism ([place])   . Tendonitis 2012   from quinolone  . Toe amputation status ([place]) 2010   Family History  Problem Relation Age of Onset  . Dementia Mother   . Stroke Father   . Pulmonary disease Brother   . Heart disease Neg Hx   . Diabetes Neg Hx    Past Surgical History:  Procedure Laterality Date  . APPENDECTOMY    . CHOLECYSTECTOMY    . HERNIA REPAIR     . INTRAMEDULLARY (IM) NAIL INTERTROCHANTERIC Right 06/13/2017   Procedure: INTRAMEDULLARY (IM) NAIL INTERTROCHANTRIC;  Surgeon: Hessie Knows, MD;  Location: [facility] ORS;  Service: Orthopedics;  Laterality: Right;  . LITHOTRIPSY  2009/2010   then stent and cystoscopic removal 2014  . Oral cancer removed Right 2004  . REPAIR ZENKER'S DIVERTICULA  2013  . THYROIDECTOMY  2007  . TOE AMPUTATION Right 2010   badly overlapping other toe  . TOTAL KNEE ARTHROPLASTY Left   . TRANSURETHRAL RESECTION OF PROSTATE  2011  . UMBILICAL HERNIA REPAIR  2007   Patient Active Problem List   Diagnosis Date Noted  . Hematuria 07/25/2017  . Hip fracture ([place]) 06/12/2017  . Advance directive discussed with patient 11/01/2016  . Renal cyst, right 06/06/2016  . Acute deep vein thrombosis (DVT) of popliteal vein of right lower extremity ([place]) 04/28/2016  . Benign essential HTN 04/28/2016  . CKD (chronic kidney disease) stage 3, GFR 30-59 ml/min (HCC) 03/05/2016  . Acute saddle pulmonary embolism without acute cor pulmonale (HCC) 03/03/2016  . Vitamin B12 deficiency 09/09/2014  . Preventative health care 08/04/2014  . Parkinson's disease ([place]) 08/04/2014  . Esophageal diverticulum 01/01/2014  . Actinic keratosis 07/31/2013  . Hypertension   . Osteoarthritis of both knees   .  Toe amputation status ([place])   . Benign prostatic hyperplasia   . Kidney stones   . Hypothyroid   . Tendonitis       Prior to Admission medications   Medication Sig Start Date End Date Taking? Authorizing Provider  acetaminophen (TYLENOL) 325 MG tablet Take 2 tablets (650 mg total) by mouth every 6 (six) hours as needed for mild pain or moderate pain (or Fever >/= 101). 06/16/17  Yes Dustin Flock, MD  carbidopa-levodopa (SINEMET IR) 25-100 MG tablet TAKE 1 TABLET THREE TIMES DAILY 12/13/16  Yes Venia Carbon, MD  Cholecalciferol (VITAMIN D) 2000 UNITS CAPS Take 2,000 Units by mouth daily.    Yes [provider]    ELIQUIS 5 MG TABS tablet TAKE 1 TABLET TWICE DAILY 04/12/17  Yes Corcoran, Drue Second, MD  HYDROcodone-acetaminophen (HYCET) 7.5-325 mg/15 ml solution Take 5-10 mLs by mouth every 4 (four) hours as needed for moderate pain. 07/26/17  Yes Venia Carbon, MD  levothyroxine (SYNTHROID, LEVOTHROID) 112 MCG tablet TAKE 1 TABLET EVERY DAY BEFORE BREAKFAST 12/13/16  Yes Venia Carbon, MD  polyethylene glycol (MIRALAX / GLYCOLAX) packet Take 17 g by mouth daily.    Yes [provider]  vitamin B-12 (CYANOCOBALAMIN) 1000 MCG tablet Take 1,000 mcg by mouth daily.   Yes [provider]  docusate sodium (COLACE) 100 MG capsule Take 1 capsule (100 mg total) by mouth 2 (two) times daily. Patient not taking: Reported on 08/20/2017 06/16/17   Dustin Flock, MD  ranitidine (ZANTAC) 150 MG/10ML syrup Take 10 mLs (150 mg total) by mouth 2 (two) times daily. 08/20/17   Merlyn Lot, MD    Allergies Quinolones    Social History Social History   Tobacco Use  . Smoking status: Former Smoker    Types: Cigarettes    Last attempt to quit: 04/27/2002    Years since quitting: 15.3  . Smokeless tobacco: Never Used  Substance Use Topics  . Alcohol use: Yes    Alcohol/week: 0.0 oz    Comment: one drink daily (wine or scotch)  . Drug use: No    Review of Systems Patient denies headaches, rhinorrhea, blurry vision, numbness, shortness of breath, chest pain, edema, cough, abdominal pain, nausea, vomiting, diarrhea, dysuria, fevers, rashes or hallucinations unless otherwise stated above in HPI. ____________________________________________   PHYSICAL EXAM:  VITAL SIGNS: Vitals:   08/20/17 1040 08/20/17 1300  BP: 120/66 (!) 152/82  Pulse: 81 78  Resp: 18   Temp: 98.4 F (36.9 C)   SpO2: 97% 95%    Constitutional: Alert and oriented.  Eyes: Conjunctivae are normal.  Head: Atraumatic. Nose: No congestion/rhinnorhea. Mouth/Throat: Mucous membranes are moist.   Neck: No  stridor. Painless ROM.  Cardiovascular: Normal rate, regular rhythm. Grossly normal heart sounds.  Good peripheral circulation. Respiratory: Normal respiratory effort.  No retractions. Lungs CTAB. Gastrointestinal: Soft and nontender. No distention. No abdominal bruits. No CVA tenderness. Genitourinary:  Musculoskeletal: No lower extremity tenderness nor edema.  No joint effusions. Neurologic:  Normal speech and language. No gross focal neurologic deficits are appreciated. No facial droop Skin:  Skin is warm, dry and intact. No rash noted. Psychiatric: Mood and affect are normal. Speech and behavior are normal.  ____________________________________________   LABS (all labs ordered are listed, but only abnormal results are displayed)  Results for orders placed or performed during the hospital encounter of 08/20/17 (from the past 24 hour(s))  CBC with Differential/Platelet     Status: Abnormal   Collection  Time: 08/20/17  1:12 PM  Result Value Ref Range   WBC 6.5 3.8 - 10.6 K/uL   RBC 4.52 4.40 - 5.90 MIL/uL   Hemoglobin 13.9 13.0 - 18.0 g/dL   HCT 41.1 40.0 - 52.0 %   MCV 91.0 80.0 - 100.0 fL   MCH 30.8 26.0 - 34.0 pg   MCHC 33.8 32.0 - 36.0 g/dL   RDW 14.9 (H) 11.5 - 14.5 %   Platelets 247 150 - 440 K/uL   Neutrophils Relative % 73 %   Neutro Abs 4.8 1.4 - 6.5 K/uL   Lymphocytes Relative 16 %   Lymphs Abs 1.0 1.0 - 3.6 K/uL   Monocytes Relative 9 %   Monocytes Absolute 0.6 0.2 - 1.0 K/uL   Eosinophils Relative 1 %   Eosinophils Absolute 0.1 0 - 0.7 K/uL   Basophils Relative 1 %   Basophils Absolute 0.0 0 - 0.1 K/uL  Basic metabolic panel     Status: Abnormal   Collection Time: 08/20/17  1:12 PM  Result Value Ref Range   Sodium 141 135 - 145 mmol/L   Potassium 3.7 3.5 - 5.1 mmol/L   Chloride 106 101 - 111 mmol/L   CO2 26 22 - 32 mmol/L   Glucose, Bld 93 65 - 99 mg/dL   BUN 22 (H) 6 - 20 mg/dL   Creatinine, Ser 1.41 (H) 0.61 - 1.24 mg/dL   Calcium 9.0 8.9 - 10.3 mg/dL    GFR calc non Af Amer 44 (L) >60 mL/min   GFR calc Af Amer 51 (L) >60 mL/min   Anion gap 9 5 - 15   ____________________________________________ ____________________________________________  RADIOLOGY  I personally reviewed all radiographic images ordered to evaluate for the above acute complaints and reviewed radiology reports and findings.  These findings were personally discussed with the patient.  Please see medical record for radiology report.  ____________________________________________   PROCEDURES  Procedure(s) performed:  Procedures    Critical Care performed: no ____________________________________________   INITIAL IMPRESSION / ASSESSMENT AND PLAN / ED COURSE  Pertinent labs & imaging results that were available during my care of the patient were reviewed by me and considered in my medical decision making (see chart for details).   DDX: fb impaction, esophageal stricture, esophagitis, mediastinitis, mass  Kalil Woessner is a 84 y.o. who presents to the ED with dysphasia as described above.  Patient tolerating his secretions.  Certainly concerning for food bolus impaction that likely has resolved versus possible esophageal stenosis esophagitis.  Oropharynx is clear.  He is otherwise well-appearing and nontoxic at this time but his presentation is complicated by multiple chronic comorbidities as well as his age.  Will order all GI cocktail and then order p.o. challenge.  Blood work and x-ray ordered for the above differential are reassuring.  Clinical Course as of Aug 20 1457  Sun Aug 20, 2017  1449 Tolerating oral hydration and orange juice without any dysphasia.  Not clinically consistent with food bolus impaction.  Will start on soft food diet as discussed focusing on clear liquids over the next few days will start on oral solution of an acid medication and give referral to GI.  Discussed signs symptoms which he should return immediately to the hospital.  Have discussed  with the patient and available family all diagnostics and treatments performed thus far and all questions were answered to the best of my ability. The patient demonstrates understanding and agreement with plan.    [PR]  Clinical Course User Index [PR] Merlyn Lot, MD     As part of my medical decision making, I reviewed the following data within the [place] notes reviewed and incorporated, Labs reviewed, notes from prior ED visits.  ____________________________________________   FINAL CLINICAL IMPRESSION(S) / ED DIAGNOSES  Final diagnoses:  Dysphagia, unspecified type      NEW MEDICATIONS STARTED DURING THIS VISIT:  New Prescriptions   RANITIDINE (ZANTAC) 150 MG/10ML SYRUP    Take 10 mLs (150 mg total) by mouth 2 (two) times daily.     Note:  This document was prepared using Dragon voice recognition software and may include unintentional dictation errors.    Merlyn Lot, MD 08/20/17 1459

## 2017-08-20 NOTE — Discharge Instructions (Signed)
Please be sure to drink a very soft diet over the next few days.  Things like boost milkshakes and juices would be ideal.  I have written a prescription for Zantac solution which suppresses the acid in your stomach and should help with any inflammation in your esophagus.  I have also spoken with Dr. Vicente Males of gastroenterology who is looking forward to seeing you in his clinic.  Please call his office listed above tomorrow morning for an urgent appointment.  Return for any worsening symptoms.

## 2017-08-20 NOTE — ED Notes (Signed)
Pt has hx of dysphagia and states yesterday got choked and this morning could not swallow his orange juice. Pt ordered gi cocktail which pt was able to sip through a straw slowly. Pt started coughing after he finished the meds and was able to clear throat when finished. Was very gurgly and was unable to cough anything up. Once coughing complete voice quality was clear.

## 2017-08-20 NOTE — ED Triage Notes (Signed)
Patient states he choked on an apple pie crust last night and then coughed up a lot of phlegm.  Patient states he thought he was okay but then this morning he had difficulty swallowing orange juice.  Patient states he could drink water, but not orange juice.  Patient has history of parkinson's and "zinker's".  Patient states he is feeling like something is lodged in his throat.  Patient is in no obvious distress at this time.

## 2017-09-01 ENCOUNTER — Ambulatory Visit: Payer: Medicare HMO | Admitting: Neurology

## 2017-09-08 DIAGNOSIS — Z8781 Personal history of (healed) traumatic fracture: Secondary | ICD-10-CM | POA: Diagnosis not present

## 2017-09-08 DIAGNOSIS — Z967 Presence of other bone and tendon implants: Secondary | ICD-10-CM | POA: Diagnosis not present

## 2017-09-09 ENCOUNTER — Encounter: Payer: Self-pay | Admitting: Anesthesiology

## 2017-09-09 ENCOUNTER — Ambulatory Visit: Payer: Medicare HMO | Admitting: Family Medicine

## 2017-09-09 ENCOUNTER — Inpatient Hospital Stay
Admission: EM | Admit: 2017-09-09 | Discharge: 2017-09-10 | DRG: 694 | Disposition: A | Discharge status: Home / Self Care | Payer: Medicare HMO | Attending: Internal Medicine | Admitting: Internal Medicine

## 2017-09-09 ENCOUNTER — Other Ambulatory Visit: Payer: Self-pay

## 2017-09-09 ENCOUNTER — Emergency Department: Payer: Medicare HMO

## 2017-09-09 DIAGNOSIS — Z79891 Long term (current) use of opiate analgesic: Secondary | ICD-10-CM

## 2017-09-09 DIAGNOSIS — Z87891 Personal history of nicotine dependence: Secondary | ICD-10-CM

## 2017-09-09 DIAGNOSIS — N132 Hydronephrosis with renal and ureteral calculous obstruction: Principal | ICD-10-CM | POA: Diagnosis present

## 2017-09-09 DIAGNOSIS — Z66 Do not resuscitate: Secondary | ICD-10-CM | POA: Diagnosis present

## 2017-09-09 DIAGNOSIS — M1711 Unilateral primary osteoarthritis, right knee: Secondary | ICD-10-CM | POA: Diagnosis present

## 2017-09-09 DIAGNOSIS — Z9049 Acquired absence of other specified parts of digestive tract: Secondary | ICD-10-CM | POA: Diagnosis not present

## 2017-09-09 DIAGNOSIS — N400 Enlarged prostate without lower urinary tract symptoms: Secondary | ICD-10-CM | POA: Diagnosis present

## 2017-09-09 DIAGNOSIS — I82409 Acute embolism and thrombosis of unspecified deep veins of unspecified lower extremity: Secondary | ICD-10-CM | POA: Diagnosis not present

## 2017-09-09 DIAGNOSIS — Z79890 Hormone replacement therapy (postmenopausal): Secondary | ICD-10-CM

## 2017-09-09 DIAGNOSIS — Z7901 Long term (current) use of anticoagulants: Secondary | ICD-10-CM | POA: Diagnosis not present

## 2017-09-09 DIAGNOSIS — N179 Acute kidney failure, unspecified: Secondary | ICD-10-CM

## 2017-09-09 DIAGNOSIS — I129 Hypertensive chronic kidney disease with stage 1 through stage 4 chronic kidney disease, or unspecified chronic kidney disease: Secondary | ICD-10-CM | POA: Diagnosis not present

## 2017-09-09 DIAGNOSIS — N201 Calculus of ureter: Secondary | ICD-10-CM | POA: Diagnosis not present

## 2017-09-09 DIAGNOSIS — Z89421 Acquired absence of other right toe(s): Secondary | ICD-10-CM | POA: Diagnosis not present

## 2017-09-09 DIAGNOSIS — E890 Postprocedural hypothyroidism: Secondary | ICD-10-CM | POA: Diagnosis present

## 2017-09-09 DIAGNOSIS — Z823 Family history of stroke: Secondary | ICD-10-CM | POA: Diagnosis not present

## 2017-09-09 DIAGNOSIS — Z87442 Personal history of urinary calculi: Secondary | ICD-10-CM

## 2017-09-09 DIAGNOSIS — R7989 Other specified abnormal findings of blood chemistry: Secondary | ICD-10-CM | POA: Diagnosis not present

## 2017-09-09 DIAGNOSIS — Z85819 Personal history of malignant neoplasm of unspecified site of lip, oral cavity, and pharynx: Secondary | ICD-10-CM

## 2017-09-09 DIAGNOSIS — N189 Chronic kidney disease, unspecified: Secondary | ICD-10-CM | POA: Diagnosis present

## 2017-09-09 DIAGNOSIS — G20 Parkinson's disease: Secondary | ICD-10-CM | POA: Diagnosis present

## 2017-09-09 DIAGNOSIS — Z86711 Personal history of pulmonary embolism: Secondary | ICD-10-CM

## 2017-09-09 DIAGNOSIS — Z86718 Personal history of other venous thrombosis and embolism: Secondary | ICD-10-CM | POA: Diagnosis not present

## 2017-09-09 DIAGNOSIS — N1330 Unspecified hydronephrosis: Secondary | ICD-10-CM | POA: Diagnosis not present

## 2017-09-09 DIAGNOSIS — R310 Gross hematuria: Secondary | ICD-10-CM | POA: Diagnosis present

## 2017-09-09 DIAGNOSIS — Z96652 Presence of left artificial knee joint: Secondary | ICD-10-CM | POA: Diagnosis present

## 2017-09-09 DIAGNOSIS — N183 Chronic kidney disease, stage 3 (moderate): Secondary | ICD-10-CM | POA: Diagnosis not present

## 2017-09-09 DIAGNOSIS — Z79899 Other long term (current) drug therapy: Secondary | ICD-10-CM

## 2017-09-09 DIAGNOSIS — N200 Calculus of kidney: Secondary | ICD-10-CM

## 2017-09-09 LAB — BASIC METABOLIC PANEL
ANION GAP: 8 (ref 5–15)
BUN: 34 mg/dL — ABNORMAL HIGH (ref 8–23)
CALCIUM: 9.1 mg/dL (ref 8.9–10.3)
CO2: 29 mmol/L (ref 22–32)
Chloride: 105 mmol/L (ref 98–111)
Creatinine, Ser: 2.03 mg/dL — ABNORMAL HIGH (ref 0.61–1.24)
GFR calc Af Amer: 33 mL/min — ABNORMAL LOW (ref >60)
GFR calc non Af Amer: 28 mL/min — ABNORMAL LOW (ref >60)
GLUCOSE: 114 mg/dL — AB (ref 70–99)
Potassium: 4.1 mmol/L (ref 3.5–5.1)
Sodium: 142 mmol/L (ref 135–145)

## 2017-09-09 LAB — CBC
HCT: 40.9 % (ref 40.0–52.0)
HEMOGLOBIN: 14.1 g/dL (ref 13.0–18.0)
MCH: 31.1 pg (ref 26.0–34.0)
MCHC: 34.4 g/dL (ref 32.0–36.0)
MCV: 90.4 fL (ref 80.0–100.0)
PLATELETS: 214 10*3/uL (ref 150–440)
RBC: 4.53 MIL/uL (ref 4.40–5.90)
RDW: 14.3 % (ref 11.5–14.5)
WBC: 8.2 10*3/uL (ref 3.8–10.6)

## 2017-09-09 LAB — URINALYSIS, COMPLETE (UACMP) WITH MICROSCOPIC
Bilirubin Urine: NEGATIVE
Glucose, UA: NEGATIVE mg/dL
KETONES UR: NEGATIVE mg/dL
NITRITE: NEGATIVE
PH: 5 (ref 5.0–8.0)
Protein, ur: 100 mg/dL — AB
Specific Gravity, Urine: 1.021 (ref 1.005–1.030)
Squamous Epithelial / LPF: NONE SEEN (ref 0–5)

## 2017-09-09 MED ORDER — SODIUM CHLORIDE 0.9 % IV BOLUS
1000.0000 mL | Freq: Once | INTRAVENOUS | Status: AC
  Administered 2017-09-09: 1000 mL via INTRAVENOUS

## 2017-09-09 MED ORDER — ONDANSETRON HCL 4 MG/2ML IJ SOLN: 4.0000 mg | Freq: Four times a day (QID) | INTRAMUSCULAR | Status: DC | PRN

## 2017-09-09 MED ORDER — ACETAMINOPHEN 325 MG PO TABS: 650.0000 mg | ORAL_TABLET | Freq: Four times a day (QID) | ORAL | Status: DC | PRN

## 2017-09-09 MED ORDER — VITAMIN B-12 1000 MCG PO TABS: 1000.0000 ug | ORAL_TABLET | Freq: Every day | ORAL | Status: DC

## 2017-09-09 MED ORDER — SODIUM CHLORIDE 0.9 % IV SOLN
INTRAVENOUS | Status: DC
  Administered 2017-09-09 – 2017-09-10 (×3): via INTRAVENOUS

## 2017-09-09 MED ORDER — CARBIDOPA-LEVODOPA 25-100 MG PO TABS
1.0000 | ORAL_TABLET | Freq: Three times a day (TID) | ORAL | Status: DC
  Administered 2017-09-09 (×2): 1 via ORAL
  Filled 2017-09-09 (×5): qty 1

## 2017-09-09 MED ORDER — DOCUSATE SODIUM 100 MG PO CAPS
100.0000 mg | ORAL_CAPSULE | Freq: Two times a day (BID) | ORAL | Status: DC
  Filled 2017-09-09: qty 1

## 2017-09-09 MED ORDER — ACETAMINOPHEN 650 MG RE SUPP: 650.0000 mg | Freq: Four times a day (QID) | RECTAL | Status: DC | PRN

## 2017-09-09 MED ORDER — HYDROCODONE-ACETAMINOPHEN 7.5-325 MG/15ML PO SOLN
5.0000 mL | ORAL | Status: DC | PRN
  Administered 2017-09-09: 5 mL via ORAL
  Administered 2017-09-09 – 2017-09-10 (×3): 10 mL via ORAL
  Filled 2017-09-09 (×4): qty 15

## 2017-09-09 MED ORDER — SODIUM CHLORIDE 0.9 % IV SOLN
1.0000 g | Freq: Once | INTRAVENOUS | Status: AC
  Administered 2017-09-09: 1 g via INTRAVENOUS
  Filled 2017-09-09: qty 10

## 2017-09-09 MED ORDER — VITAMIN D 1000 UNITS PO TABS: 2000.0000 [IU] | ORAL_TABLET | Freq: Every day | ORAL | Status: DC

## 2017-09-09 MED ORDER — BISACODYL 5 MG PO TBEC: 5.0000 mg | DELAYED_RELEASE_TABLET | Freq: Every day | ORAL | Status: DC | PRN

## 2017-09-09 MED ORDER — SODIUM CHLORIDE 0.9 % IV SOLN
1.0000 g | INTRAVENOUS | Status: DC
  Filled 2017-09-09 (×2): qty 10

## 2017-09-09 MED ORDER — DOCUSATE SODIUM 100 MG PO CAPS: 100.0000 mg | ORAL_CAPSULE | Freq: Two times a day (BID) | ORAL | Status: DC

## 2017-09-09 MED ORDER — TRAZODONE HCL 50 MG PO TABS: 25.0000 mg | ORAL_TABLET | Freq: Every evening | ORAL | Status: DC | PRN

## 2017-09-09 MED ORDER — AMLODIPINE BESYLATE 5 MG PO TABS: 5.0000 mg | ORAL_TABLET | Freq: Every day | ORAL | Status: DC

## 2017-09-09 MED ORDER — POLYETHYLENE GLYCOL 3350 17 G PO PACK: 17.0000 g | PACK | Freq: Every day | ORAL | Status: DC

## 2017-09-09 MED ORDER — RANITIDINE HCL 150 MG/10ML PO SYRP
75.0000 mg | ORAL_SOLUTION | Freq: Two times a day (BID) | ORAL | Status: DC
  Filled 2017-09-09 (×3): qty 10

## 2017-09-09 MED ORDER — ONDANSETRON HCL 4 MG PO TABS: 4.0000 mg | ORAL_TABLET | Freq: Four times a day (QID) | ORAL | Status: DC | PRN

## 2017-09-09 MED ORDER — LEVOTHYROXINE SODIUM 112 MCG PO TABS
112.0000 ug | ORAL_TABLET | Freq: Every day | ORAL | Status: DC
  Administered 2017-09-10: 112 ug via ORAL
  Filled 2017-09-09: qty 1

## 2017-09-09 NOTE — H&P (Addendum)
[facility] [place] NAME: Walter [name]    MR#:  [id number]  DATE OF BIRTH:  Jan 20, 1932  DATE OF ADMISSION:  09/09/2017  PRIMARY CARE PHYSICIAN: Venia Carbon, MD   REQUESTING/REFERRING PHYSICIAN: Dr. Kelli Hope  CHIEF COMPLAINT: right flank pain, hematuria .   Chief Complaint  Patient presents with  . Hematuria  . Flank Pain    HISTORY OF PRESENT ILLNESS:  Walter [name]  is a 84 y.o. male with a known history of DVT and PE  on Eliquis, kidney stones, Parkinson'disease, hypertension, hypothyroidism, BPH, presents for evaluation of hematuria, right flank pain that started last evening. Patient has sharp right flank pain associated with hematuria. Patient reports intermittent hematuria for the past four years. No dizziness, no chest pain. No fever. Patient  Right  pain resolved by the time he came to er. Patient had a CT renal stone study in the emergency room which showed severe right-sided hydronephrosis with multiple calculi located in the distal right ureter . Patient stones are measuring that on 5 mm in diameter and also the impact of calculus at [facility] and measuring around 8 mm and greater diameter. Patient CT scan results discussed with Dr. Nicolette Bang by your physician, patient scheduled to have right to ureteral stent placement this evening by neurology. Patient denies any flank pain and he is very agitated and wants to go to his room. He did not have any fever, chills, nausea. PAST MEDICAL HISTORY:   Past Medical History:  Diagnosis Date  . ARF (acute renal failure) ([place]) 2011   after TKR  . BPH (benign prostatic hypertrophy)   . DVT (deep venous thrombosis) ([place]) 1998   after left knee arthroscopy  . Hx of colonoscopy 2000  . Hypertension   . Hypothyroid 2007   postop from thyroidectomy (cancer scare)  . Kidney stones    recurrent  . Oral cancer ([place]) 2004   graft from left thigh  . Osteoarthritis of both knees   .  Parkinson's disease ([place])   . Pulmonary embolism ([place])   . Tendonitis 2012   from quinolone  . Toe amputation status ([place]) 2010    PAST SURGICAL HISTOIRY:   Past Surgical History:  Procedure Laterality Date  . APPENDECTOMY    . CHOLECYSTECTOMY    . HERNIA REPAIR    . INTRAMEDULLARY (IM) NAIL INTERTROCHANTERIC Right 06/13/2017   Procedure: INTRAMEDULLARY (IM) NAIL INTERTROCHANTRIC;  Surgeon: Hessie Knows, MD;  Location: [facility] ORS;  Service: Orthopedics;  Laterality: Right;  . LITHOTRIPSY  2009/2010   then stent and cystoscopic removal 2014  . Oral cancer removed Right 2004  . REPAIR ZENKER'S DIVERTICULA  2013  . THYROIDECTOMY  2007  . TOE AMPUTATION Right 2010   badly overlapping other toe  . TOTAL KNEE ARTHROPLASTY Left   . TRANSURETHRAL RESECTION OF PROSTATE  2011  . UMBILICAL HERNIA REPAIR  2007    SOCIAL HISTORY:   Social History   Tobacco Use  . Smoking status: Former Smoker    Types: Cigarettes    Last attempt to quit: 04/27/2002    Years since quitting: 15.3  . Smokeless tobacco: Never Used  Substance Use Topics  . Alcohol use: Yes    Alcohol/week: 0.0 oz    Comment: one drink daily (wine or scotch)    FAMILY HISTORY:   Family History  Problem Relation Age of Onset  . Dementia Mother   . Stroke Father   . Pulmonary  disease Brother   . Heart disease Neg Hx   . Diabetes Neg Hx     DRUG ALLERGIES:   Allergies  Allergen Reactions  . Quinolones     tendonitis    REVIEW OF SYSTEMS:  CONSTITUTIONAL: No fever, fatigue or weakness.  EYES: No blurred or double vision.  EARS, NOSE, AND THROAT: No tinnitus or ear pain.  RESPIRATORY: No cough, shortness of breath, wheezing or hemoptysis.  CARDIOVASCULAR: No chest pain, orthopnea, edema.  GASTROINTESTINAL: No nausea, vomiting, diarrhea or abdominal pain.  GENITOURINARY: No dysuria, hematuria.  ENDOCRINE: No polyuria, nocturia,  HEMATOLOGY: No anemia, easy bruising or bleeding SKIN: No rash or  lesion. MUSCULOSKELETAL: No joint pain or arthritis.   NEUROLOGIC: No tingling, numbness, weakness.  PSYCHIATRY: ANXIOUS ANDRESTLESS  MEDICATIONS AT HOME:   Prior to Admission medications   Medication Sig Start Date End Date Taking? Authorizing Provider  acetaminophen (TYLENOL) 325 MG tablet Take 2 tablets (650 mg total) by mouth every 6 (six) hours as needed for mild pain or moderate pain (or Fever >/= 101). 06/16/17  Yes Dustin Flock, MD  amLODipine (NORVASC) 5 MG tablet Take 5 mg by mouth daily. 08/11/17  Yes [provider]  carbidopa-levodopa (SINEMET IR) 25-100 MG tablet TAKE 1 TABLET THREE TIMES DAILY 12/13/16  Yes Venia Carbon, MD  Cholecalciferol (VITAMIN D) 2000 UNITS CAPS Take 2,000 Units by mouth daily.    Yes [provider]  ELIQUIS 5 MG TABS tablet TAKE 1 TABLET TWICE DAILY 04/12/17  Yes Corcoran, Drue Second, MD  HYDROcodone-acetaminophen (HYCET) 7.5-325 mg/15 ml solution Take 5-10 mLs by mouth every 4 (four) hours as needed for moderate pain. 07/26/17  Yes Venia Carbon, MD  levothyroxine (SYNTHROID, LEVOTHROID) 112 MCG tablet TAKE 1 TABLET EVERY DAY BEFORE BREAKFAST 12/13/16  Yes Venia Carbon, MD  polyethylene glycol (MIRALAX / GLYCOLAX) packet Take 17 g by mouth daily.    Yes [provider]  ranitidine (ZANTAC) 150 MG/10ML syrup Take 10 mLs (150 mg total) by mouth 2 (two) times daily. 08/20/17  Yes Merlyn Lot, MD  vitamin B-12 (CYANOCOBALAMIN) 1000 MCG tablet Take 1,000 mcg by mouth daily.   Yes [provider]  docusate sodium (COLACE) 100 MG capsule Take 1 capsule (100 mg total) by mouth 2 (two) times daily. Patient not taking: Reported on 08/20/2017 06/16/17   Dustin Flock, MD      VITAL SIGNS:  Blood pressure 123/62, pulse 76, temperature 98.3 F (36.8 C), temperature source Oral, resp. rate 14, height 5\' 10"  (1.778 m), weight 77.1 kg (170 lb), SpO2 97 %.  PHYSICAL EXAMINATION:  GENERAL:  84 y.o.-year-old  patient lying in the bed with no acute distress.  EYES: Pupils equal, round, reactive to light and accommodation. No scleral icterus. Extraocular muscles intact.  HEENT: Head atraumatic, normocephalic. Oropharynx and nasopharynx clear.  NECK:  Supple, no jugular venous distention. No thyroid enlargement, no tenderness.  LUNGS: Normal breath sounds bilaterally, no wheezing, rales,rhonchi or crepitation. No use of accessory muscles of respiration.  CARDIOVASCULAR: S1, S2 normal. No murmurs, rubs, or gallops.  ABDOMEN: Soft, nontender, nondistended. Bowel sounds present. No organomegaly or mass.  EXTREMITIES: No pedal edema, cyanosis, or clubbing.  NEUROLOGIC: Cranial nerves II through XII are intact. Muscle strength 5/5 in all extremities. Sensation intact. Gait not checked.  PSYCHIATRIC: The patient is alert and oriented x 3.  SKIN: No obvious rash, lesion, or ulcer.   LABORATORY PANEL:   CBC Recent Labs  Lab 09/09/17 1104  WBC 8.2  HGB 14.1  HCT 40.9  PLT 214   ------------------------------------------------------------------------------------------------------------------  Chemistries  Recent Labs  Lab 09/09/17 1104  NA 142  K 4.1  CL 105  CO2 29  GLUCOSE 114*  BUN 34*  CREATININE 2.03*  CALCIUM 9.1   ------------------------------------------------------------------------------------------------------------------  Cardiac Enzymes No results for input(s): TROPONINI in the last 168 hours. ------------------------------------------------------------------------------------------------------------------  RADIOLOGY:  Ct Renal Stone Study  Result Date: 09/09/2017 CLINICAL DATA:  Right-sided flank pain and hematuria. History of renal calculi. EXAM: CT ABDOMEN AND PELVIS WITHOUT CONTRAST TECHNIQUE: Multidetector CT imaging of the abdomen and pelvis was performed following the standard protocol without IV contrast. COMPARISON:  03/11/2016 FINDINGS: Lower chest: Scarring and  atelectasis present at both lung bases. Hepatobiliary: No focal liver abnormality is seen. Status post cholecystectomy. No biliary dilatation. Pancreas: Unremarkable. No pancreatic ductal dilatation or surrounding inflammatory changes. Spleen: Normal in size without focal abnormality. Adrenals/Urinary Tract: There is evidence of severe right-sided hydronephrosis. As a dilated right ureter is followed into the pelvis, multiple calculi are present within the distal ureter. A string of 3-4 calculi are present with the largest measuring 5 mm just proximal to the UVJ. There is an impacted calculus at the UVJ measuring 8 mm in greatest diameter. There are some smaller intrarenal calculi. Cyst emanating from the lower pole of the right kidney similar to slightly larger in size and measures 8 cm. Several small nonobstructing calculi are seen scattered in the left kidney, similar in appearance to the prior study. No evidence of left-sided hydronephrosis or ureteral calculi on the left. The bladder is decompressed and contains no visible calculi. No adrenal masses. Stomach/Bowel: Bowel shows no evidence of obstruction or ileus. No free air identified. Vascular/Lymphatic: Stable aneurysmal dilatation of the aorta just below the renal arteries measuring 3.6 cm in greatest diameter. Reproductive: Stable calcifications within the prostate gland. Other: Stable left inguinal hernia containing fat. Musculoskeletal: Stable mild degenerative disc disease of the lumbar spine. IMPRESSION: 1. Severe right-sided hydronephrosis with multiple calculi located in the distal right ureter. There are a string of 3-4 calculi present in the distal ureter just proximal to the UVJ. These measure up to roughly 5 mm in diameter. There also is an impacted calculus at the UVJ measuring 8 mm in greatest diameter. 2. Stable mild aneurysmal dilatation of the abdominal aorta with maximal diameter of 3.6 cm. Electronically Signed   By: Aletta Edouard M.D.    On: 09/09/2017 11:35    EKG:   Orders placed or performed during the hospital encounter of 06/12/17  . ED EKG  . ED EKG  . EKG    IMPRESSION AND PLAN:  84 year old male patient with history of DVT, DVT/PE on ELiquis , kidney stones before, Parkinson's disease, hypertension, hypothyroidism came in because of right flank pain, hematuria.  1 acute renal failure secondary to severe right hydronephrosis, multiple right ureter calculi; admitted to medical service, continue IV fluids, PRN iv Morphine, IV antibiotics, consulted neurology with Dr. Nicolette Bang, ER physician spoke with urology Dr./Patrick Stormy Fabian, patient is scheduled to have right ureteral  stent placement.  2. history of for DVT, PE: hold Eliquis.,resume after procedure. 4.parkinson disease;on sinemet 5,hypothyroid;continue synthyroid  All the records are reviewed and case discussed with ED provider. Management plans discussed with the patient, family and they are in agreement.  CODE STATUS: full  TOTAL TIME TAKING CARE OF THIS PATIENT:55 minutes.    Epifanio Lesches M.D on 09/09/2017 at 4:14 PM  Between 7am to  6pm - Pager - [phone]  After 6pm go to [email or web address] - password [email or web address] [facility]  Office  [phone]  CC: Primary care physician; Venia Carbon, MD  Note: This dictation was prepared with Dragon dictation along with smaller phrase technology. Any transcriptional errors that result from this process are unintentional.

## 2017-09-09 NOTE — ED Triage Notes (Signed)
Pt arrives to ED c/o of R kidney pain and hematuria that began yesterday. Hx kidney stones 4 years ago. Alert, oriented, family with pt. Some nausea.

## 2017-09-09 NOTE — ED Provider Notes (Addendum)
[facility] Emergency Department Provider Note  ____________________________________________  Time seen: Approximately 11:39 AM  I have reviewed the triage vital signs and the nursing notes.   HISTORY  Chief Complaint Hematuria and Flank Pain   HPI Walter [name] is a 84 y.o. male history of DVT/PE on Eliquis, kidney stones, Parkinson's disease, hypertension, hypothyroidism, BPH who presents for evaluation of hematuria and right flank pain.  Patient reports that the pain started yesterday evening, sharp, located in the right flank, radiating to his right groin, moderate and intermittent.  Associated with nausea but no vomiting.  Pain is mostly resolved at this time.  Patient reports intermittent hematuria for at least 4 years.  He denies dizziness, chest pain or shortness of breath. No fever or chills.  Past Medical History:  Diagnosis Date  . ARF (acute renal failure) ([place]) 2011   after TKR  . BPH (benign prostatic hypertrophy)   . DVT (deep venous thrombosis) ([place]) 1998   after left knee arthroscopy  . Hx of colonoscopy 2000  . Hypertension   . Hypothyroid 2007   postop from thyroidectomy (cancer scare)  . Kidney stones    recurrent  . Oral cancer ([place]) 2004   graft from left thigh  . Osteoarthritis of both knees   . Parkinson's disease ([place])   . Pulmonary embolism ([place])   . Tendonitis 2012   from quinolone  . Toe amputation status ([place]) 2010    Patient Active Problem List   Diagnosis Date Noted  . Hematuria 07/25/2017  . Hip fracture ([place]) 06/12/2017  . Advance directive discussed with patient 11/01/2016  . Renal cyst, right 06/06/2016  . Acute deep vein thrombosis (DVT) of popliteal vein of right lower extremity ([place]) 04/28/2016  . Benign essential HTN 04/28/2016  . CKD (chronic kidney disease) stage 3, GFR 30-59 ml/min (HCC) 03/05/2016  . Acute saddle pulmonary embolism without acute cor pulmonale (HCC) 03/03/2016  . Vitamin B12  deficiency 09/09/2014  . Preventative health care 08/04/2014  . Parkinson's disease ([place]) 08/04/2014  . Esophageal diverticulum 01/01/2014  . Actinic keratosis 07/31/2013  . Hypertension   . Osteoarthritis of both knees   . Toe amputation status ([place])   . Benign prostatic hyperplasia   . Kidney stones   . Hypothyroid   . Tendonitis     Past Surgical History:  Procedure Laterality Date  . APPENDECTOMY    . CHOLECYSTECTOMY    . HERNIA REPAIR    . INTRAMEDULLARY (IM) NAIL INTERTROCHANTERIC Right 06/13/2017   Procedure: INTRAMEDULLARY (IM) NAIL INTERTROCHANTRIC;  Surgeon: Hessie Knows, MD;  Location: [facility] ORS;  Service: Orthopedics;  Laterality: Right;  . LITHOTRIPSY  2009/2010   then stent and cystoscopic removal 2014  . Oral cancer removed Right 2004  . REPAIR ZENKER'S DIVERTICULA  2013  . THYROIDECTOMY  2007  . TOE AMPUTATION Right 2010   badly overlapping other toe  . TOTAL KNEE ARTHROPLASTY Left   . TRANSURETHRAL RESECTION OF PROSTATE  2011  . UMBILICAL HERNIA REPAIR  2007    Prior to Admission medications   Medication Sig Start Date End Date Taking? Authorizing Provider  acetaminophen (TYLENOL) 325 MG tablet Take 2 tablets (650 mg total) by mouth every 6 (six) hours as needed for mild pain or moderate pain (or Fever >/= 101). 06/16/17  Yes Dustin Flock, MD  amLODipine (NORVASC) 5 MG tablet Take 5 mg by mouth daily. 08/11/17  Yes [provider]  carbidopa-levodopa (SINEMET IR) 25-100 MG tablet TAKE 1  TABLET THREE TIMES DAILY 12/13/16  Yes Venia Carbon, MD  Cholecalciferol (VITAMIN D) 2000 UNITS CAPS Take 2,000 Units by mouth daily.    Yes [provider]  ELIQUIS 5 MG TABS tablet TAKE 1 TABLET TWICE DAILY 04/12/17  Yes Corcoran, Drue Second, MD  HYDROcodone-acetaminophen (HYCET) 7.5-325 mg/15 ml solution Take 5-10 mLs by mouth every 4 (four) hours as needed for moderate pain. 07/26/17  Yes Venia Carbon, MD  levothyroxine (SYNTHROID, LEVOTHROID) 112  MCG tablet TAKE 1 TABLET EVERY DAY BEFORE BREAKFAST 12/13/16  Yes Venia Carbon, MD  polyethylene glycol (MIRALAX / GLYCOLAX) packet Take 17 g by mouth daily.    Yes [provider]  ranitidine (ZANTAC) 150 MG/10ML syrup Take 10 mLs (150 mg total) by mouth 2 (two) times daily. 08/20/17  Yes Merlyn Lot, MD  vitamin B-12 (CYANOCOBALAMIN) 1000 MCG tablet Take 1,000 mcg by mouth daily.   Yes [provider]  docusate sodium (COLACE) 100 MG capsule Take 1 capsule (100 mg total) by mouth 2 (two) times daily. Patient not taking: Reported on 08/20/2017 06/16/17   Dustin Flock, MD    Allergies Quinolones  Family History  Problem Relation Age of Onset  . Dementia Mother   . Stroke Father   . Pulmonary disease Brother   . Heart disease Neg Hx   . Diabetes Neg Hx     Social History Social History   Tobacco Use  . Smoking status: Former Smoker    Types: Cigarettes    Last attempt to quit: 04/27/2002    Years since quitting: 15.3  . Smokeless tobacco: Never Used  Substance Use Topics  . Alcohol use: Yes    Alcohol/week: 0.0 oz    Comment: one drink daily (wine or scotch)  . Drug use: No    Review of Systems  Constitutional: Negative for fever. Eyes: Negative for visual changes. ENT: Negative for sore throat. Neck: No neck pain  Cardiovascular: Negative for chest pain. Respiratory: Negative for shortness of breath. Gastrointestinal: Negative for abdominal pain, vomiting or diarrhea. Genitourinary: Negative for dysuria. + hematuria and R flank pain Musculoskeletal: Negative for back pain. Skin: Negative for rash. Neurological: Negative for headaches, weakness or numbness. Psych: No SI or HI  ____________________________________________   PHYSICAL EXAM:  VITAL SIGNS: ED Triage Vitals [09/09/17 1041]  Enc Vitals Group     BP 122/68     Pulse Rate 65     Resp 16     Temp 98.3 F (36.8 C)     Temp Source Oral     SpO2 100 %     Weight 170 lb  (77.1 kg)     Height 5\' 10"  (1.778 m)     Head Circumference      Peak Flow      Pain Score 4     Pain Loc      Pain Edu?      Excl. in [place]?     Constitutional: Alert and oriented. Well appearing and in no apparent distress. HEENT:      Head: Normocephalic and atraumatic.         Eyes: Conjunctivae are normal. Sclera is non-icteric.       Mouth/Throat: Mucous membranes are moist.       Neck: Supple with no signs of meningismus. Cardiovascular: Regular rate and rhythm. No murmurs, gallops, or rubs. 2+ symmetrical distal pulses are present in all extremities. No JVD. Respiratory: Normal respiratory effort. Lungs are clear to auscultation  bilaterally. No wheezes, crackles, or rhonchi.  Gastrointestinal: Soft, non tender, and non distended with positive bowel sounds. No rebound or guarding. Genitourinary: No CVA tenderness. Musculoskeletal: Nontender with normal range of motion in all extremities. No edema, cyanosis, or erythema of extremities. Neurologic: Normal speech and language. Face is symmetric. Moving all extremities. No gross focal neurologic deficits are appreciated. Skin: Skin is warm, dry and intact. No rash noted. Psychiatric: Mood and affect are normal. Speech and behavior are normal.  ____________________________________________   LABS (all labs ordered are listed, but only abnormal results are displayed)  Labs Reviewed  URINALYSIS, COMPLETE (UACMP) WITH MICROSCOPIC - Abnormal; Notable for the following components:      Result Value   Color, Urine YELLOW (*)    APPearance CLOUDY (*)    Hgb urine dipstick LARGE (*)    Protein, ur 100 (*)    Leukocytes, UA TRACE (*)    RBC / HPF >50 (*)    WBC, UA >50 (*)    Bacteria, UA RARE (*)    All other components within normal limits  BASIC METABOLIC PANEL - Abnormal; Notable for the following components:   Glucose, Bld 114 (*)    BUN 34 (*)    Creatinine, Ser 2.03 (*)    GFR calc non Af Amer 28 (*)    GFR calc Af Amer 33  (*)    All other components within normal limits  URINE CULTURE  CBC   ____________________________________________  EKG  ED ECG REPORT I, Rudene Re, the attending physician, personally viewed and interpreted this ECG.  Normal sinus rhythm, rate of 78, normal intervals, left axis deviation, no ST elevations or depressions.  EKG interpretation is limited due to significant artifact. ____________________________________________  RADIOLOGY  I have personally reviewed the images performed during this visit and I agree with the Radiologist's read.   Interpretation by Radiologist:  Ct Renal Stone Study  Result Date: 09/09/2017 CLINICAL DATA:  Right-sided flank pain and hematuria. History of renal calculi. EXAM: CT ABDOMEN AND PELVIS WITHOUT CONTRAST TECHNIQUE: Multidetector CT imaging of the abdomen and pelvis was performed following the standard protocol without IV contrast. COMPARISON:  03/11/2016 FINDINGS: Lower chest: Scarring and atelectasis present at both lung bases. Hepatobiliary: No focal liver abnormality is seen. Status post cholecystectomy. No biliary dilatation. Pancreas: Unremarkable. No pancreatic ductal dilatation or surrounding inflammatory changes. Spleen: Normal in size without focal abnormality. Adrenals/Urinary Tract: There is evidence of severe right-sided hydronephrosis. As a dilated right ureter is followed into the pelvis, multiple calculi are present within the distal ureter. A string of 3-4 calculi are present with the largest measuring 5 mm just proximal to the UVJ. There is an impacted calculus at the UVJ measuring 8 mm in greatest diameter. There are some smaller intrarenal calculi. Cyst emanating from the lower pole of the right kidney similar to slightly larger in size and measures 8 cm. Several small nonobstructing calculi are seen scattered in the left kidney, similar in appearance to the prior study. No evidence of left-sided hydronephrosis or ureteral  calculi on the left. The bladder is decompressed and contains no visible calculi. No adrenal masses. Stomach/Bowel: Bowel shows no evidence of obstruction or ileus. No free air identified. Vascular/Lymphatic: Stable aneurysmal dilatation of the aorta just below the renal arteries measuring 3.6 cm in greatest diameter. Reproductive: Stable calcifications within the prostate gland. Other: Stable left inguinal hernia containing fat. Musculoskeletal: Stable mild degenerative disc disease of the lumbar spine. IMPRESSION: 1. Severe right-sided hydronephrosis  with multiple calculi located in the distal right ureter. There are a string of 3-4 calculi present in the distal ureter just proximal to the UVJ. These measure up to roughly 5 mm in diameter. There also is an impacted calculus at the UVJ measuring 8 mm in greatest diameter. 2. Stable mild aneurysmal dilatation of the abdominal aorta with maximal diameter of 3.6 cm. Electronically Signed   By: Aletta Edouard M.D.   On: 09/09/2017 11:35     ____________________________________________   PROCEDURES  Procedure(s) performed: None Procedures Critical Care performed:  None ____________________________________________   INITIAL IMPRESSION / ASSESSMENT AND PLAN / ED COURSE  84 y.o. male history of DVT/PE on Eliquis, kidney stones, Parkinson's disease, hypertension, hypothyroidism, BPH who presents for evaluation of hematuria and right flank pain.  Patient reports the pain has now resolved.  He is hemodynamically stable, no tenderness on his flank or abdomen.  CT showing several ureteral stones with a severe right-sided hydronephrosis.  Labs showing acute on chronic kidney injury but stable hgb. UA pending to eval for UTI. Will give IVF and discuss with Urology  Clinical Course as of Sep 09 1440  Sat Sep 09, 2017  1441 She was started on Rocephin for obstructed stone.  Urine cultures pending.  Spoke with Dr. Noah Delaine, urologist on-call who recommended  admission for IV hydration and possible nephrostomy tube on Monday if stones do not pass.  Discussed with the hospitalist for admission   [CV]    Clinical Course User Index [CV] Alfred Levins [place], MD     As part of my medical decision making, I reviewed the following data within the [place] History obtained from family, Nursing notes reviewed and incorporated, Labs reviewed , EKG interpreted , Old chart reviewed, Radiograph reviewed , Discussed with admitting physician , A consult was requested and obtained from this/these consultant(s) Urology, Notes from prior ED visits and [place] Controlled Substance Database    Pertinent labs & imaging results that were available during my care of the patient were reviewed by me and considered in my medical decision making (see chart for details).    ____________________________________________   FINAL CLINICAL IMPRESSION(S) / ED DIAGNOSES  Final diagnoses:  Kidney stone  Acute kidney injury superimposed on chronic kidney disease ([place])      NEW MEDICATIONS STARTED DURING THIS VISIT:  ED Discharge Orders    None       Note:  This document was prepared using Dragon voice recognition software and may include unintentional dictation errors.    Alfred Levins, [place], MD 09/09/17 [place], [place], MD 09/09/17 [phone]

## 2017-09-09 NOTE — ED Notes (Signed)
Pt sitting up and eating lunch - awaiting for him to finish before starting his atb

## 2017-09-09 NOTE — ED Notes (Signed)
Report given to 2c

## 2017-09-09 NOTE — ED Notes (Signed)
Pt sitting up to side of bed

## 2017-09-09 NOTE — Progress Notes (Signed)
Family Meeting Note  Advance Directive:yes  Today a meeting took place with the Patient.  Patient told me that he wished to be DNR. He has history of DVT, PEI , on Eliquis. Parkinson's disease, hypertension and multiple medical problems.he wants to be DNR  The following clinical team members were present during this meeting:MD  The following were discussed:Patient's diagnosis: , Patient's progosis: Unable to determine and Goals for treatment: DNR  Additional follow-up to be provided: will follow up  Time spent during discussion:20 minutes  Epifanio Lesches, MD

## 2017-09-10 ENCOUNTER — Encounter
Admission: EM | Disposition: A | Discharge status: Home / Self Care | Payer: Self-pay | Source: Home / Self Care | Attending: Internal Medicine

## 2017-09-10 DIAGNOSIS — R7989 Other specified abnormal findings of blood chemistry: Secondary | ICD-10-CM

## 2017-09-10 DIAGNOSIS — N132 Hydronephrosis with renal and ureteral calculous obstruction: Secondary | ICD-10-CM

## 2017-09-10 DIAGNOSIS — R310 Gross hematuria: Secondary | ICD-10-CM

## 2017-09-10 LAB — BASIC METABOLIC PANEL
Anion gap: 5 (ref 5–15)
BUN: 28 mg/dL — AB (ref 8–23)
CALCIUM: 8 mg/dL — AB (ref 8.9–10.3)
CHLORIDE: 112 mmol/L — AB (ref 98–111)
CO2: 26 mmol/L (ref 22–32)
CREATININE: 1.96 mg/dL — AB (ref 0.61–1.24)
GFR calc non Af Amer: 30 mL/min — ABNORMAL LOW (ref >60)
GFR, EST AFRICAN AMERICAN: 34 mL/min — AB (ref >60)
GLUCOSE: 99 mg/dL (ref 70–99)
Potassium: 3.8 mmol/L (ref 3.5–5.1)
Sodium: 143 mmol/L (ref 135–145)

## 2017-09-10 LAB — CBC
HCT: 35.4 % — ABNORMAL LOW (ref 40.0–52.0)
Hemoglobin: 12 g/dL — ABNORMAL LOW (ref 13.0–18.0)
MCH: 31 pg (ref 26.0–34.0)
MCHC: 34 g/dL (ref 32.0–36.0)
MCV: 91.2 fL (ref 80.0–100.0)
Platelets: 186 10*3/uL (ref 150–440)
RBC: 3.88 MIL/uL — AB (ref 4.40–5.90)
RDW: 14.5 % (ref 11.5–14.5)
WBC: 6.7 10*3/uL (ref 3.8–10.6)

## 2017-09-10 LAB — URINE CULTURE: CULTURE: NO GROWTH

## 2017-09-10 LAB — GLUCOSE, CAPILLARY: GLUCOSE-CAPILLARY: 86 mg/dL (ref 70–99)

## 2017-09-10 SURGERY — CYSTOSCOPY, WITH RETROGRADE PYELOGRAM AND URETERAL STENT INSERTION
Anesthesia: General | Laterality: Right

## 2017-09-10 MED ORDER — TRAMADOL 5 MG/ML ORAL SUSPENSION: 25.0000 mg | Freq: Four times a day (QID) | ORAL | Status: DC | PRN

## 2017-09-10 MED ORDER — HYDROCODONE-ACETAMINOPHEN 7.5-325 MG/15ML PO SOLN: 5.0000 mL | ORAL | 0 refills | Status: DC | PRN

## 2017-09-10 MED ORDER — TAMSULOSIN HCL 0.4 MG PO CAPS
0.4000 mg | ORAL_CAPSULE | Freq: Every day | ORAL | Status: DC
  Administered 2017-09-10: 0.4 mg via ORAL
  Filled 2017-09-10: qty 1

## 2017-09-10 MED ORDER — TAMSULOSIN HCL 0.4 MG PO CAPS: 0.4000 mg | ORAL_CAPSULE | Freq: Every day | ORAL | 0 refills | Status: DC

## 2017-09-10 SURGICAL SUPPLY — 18 items
BAG DRAIN CYSTO-URO LG1000N (MISCELLANEOUS) ×3 IMPLANT
CATH URETL 5X70 OPEN END (CATHETERS) ×3 IMPLANT
CONRAY 43 FOR UROLOGY 50M (MISCELLANEOUS) ×3 IMPLANT
GLOVE BIO SURGEON STRL SZ7 (GLOVE) ×3 IMPLANT
GLOVE BIO SURGEON STRL SZ8 (GLOVE) ×3 IMPLANT
GOWN STRL REUS W/ TWL LRG LVL3 (GOWN DISPOSABLE) ×1 IMPLANT
GOWN STRL REUS W/TWL LRG LVL3 (GOWN DISPOSABLE) ×2
GUIDEWIRE STR ZIPWIRE 035X150 (MISCELLANEOUS) ×3 IMPLANT
KIT TURNOVER CYSTO (KITS) ×3 IMPLANT
PACK CYSTO AR (MISCELLANEOUS) ×3 IMPLANT
SET CYSTO W/LG BORE CLAMP LF (SET/KITS/TRAYS/PACK) ×3 IMPLANT
SOL .9 NS 3000ML IRR  AL (IV SOLUTION) ×2
SOL .9 NS 3000ML IRR UROMATIC (IV SOLUTION) ×1 IMPLANT
STENT URET 6FRX24 CONTOUR (STENTS) ×3 IMPLANT
STENT URET 6FRX26 CONTOUR (STENTS) ×3 IMPLANT
SURGILUBE 2OZ TUBE FLIPTOP (MISCELLANEOUS) ×3 IMPLANT
SYRINGE IRR TOOMEY STRL 70CC (SYRINGE) ×3 IMPLANT
WATER STERILE IRR 1000ML POUR (IV SOLUTION) ×3 IMPLANT

## 2017-09-10 NOTE — Consult Note (Addendum)
Urology consult note  I am seeing this patient because he has a large right-sided stone burden in the distal ureter with associated hydroureteronephrosis.  He also has elevated creatinine.  Interval: The patient was made n.p.o. possible procedure today.  In the interval, the patient has not had any significant pain.  He feels fine, back to normal.  The Eliquis was held yesterday in the event that he needed to be stented today.  His hematuria has improved.  He denies any nausea or vomiting.  He denies any fevers or chills.  He denies any dysuria.  He does have urinary frequency, slightly more than usual.  He had the urge to void today, only voided a small amount, PVR demonstrated 140 cc in his bladder.  Remaining 12-pointe ROS was negative.  No current facility-administered medications on file prior to encounter.    Current Outpatient Medications on File Prior to Encounter  Medication Sig Dispense Refill  . acetaminophen (TYLENOL) 325 MG tablet Take 2 tablets (650 mg total) by mouth every 6 (six) hours as needed for mild pain or moderate pain (or Fever >/= 101).    Marland Kitchen amLODipine (NORVASC) 5 MG tablet Take 5 mg by mouth daily.    . carbidopa-levodopa (SINEMET IR) 25-100 MG tablet TAKE 1 TABLET THREE TIMES DAILY 270 tablet 3  . Cholecalciferol (VITAMIN D) 2000 UNITS CAPS Take 2,000 Units by mouth daily.     Marland Kitchen ELIQUIS 5 MG TABS tablet TAKE 1 TABLET TWICE DAILY 180 tablet 11  . levothyroxine (SYNTHROID, LEVOTHROID) 112 MCG tablet TAKE 1 TABLET EVERY DAY BEFORE BREAKFAST 90 tablet 3  . polyethylene glycol (MIRALAX / GLYCOLAX) packet Take 17 g by mouth daily.     . ranitidine (ZANTAC) 150 MG/10ML syrup Take 10 mLs (150 mg total) by mouth 2 (two) times daily. 300 mL 0  . vitamin B-12 (CYANOCOBALAMIN) 1000 MCG tablet Take 1,000 mcg by mouth daily.    Marland Kitchen docusate sodium (COLACE) 100 MG capsule Take 1 capsule (100 mg total) by mouth 2 (two) times daily. (Patient not taking: Reported on 08/20/2017) 10  capsule 0   Past Medical History:  Diagnosis Date  . ARF (acute renal failure) ([place]) 2011   after TKR  . BPH (benign prostatic hypertrophy)   . DVT (deep venous thrombosis) ([place]) 1998   after left knee arthroscopy  . Hx of colonoscopy 2000  . Hypertension   . Hypothyroid 2007   postop from thyroidectomy (cancer scare)  . Kidney stones    recurrent  . Oral cancer ([place]) 2004   graft from left thigh  . Osteoarthritis of both knees   . Parkinson's disease ([place])   . Pulmonary embolism ([place])   . Tendonitis 2012   from quinolone  . Toe amputation status ([place]) 2010   Past Surgical History:  Procedure Laterality Date  . APPENDECTOMY    . CHOLECYSTECTOMY    . HERNIA REPAIR    . INTRAMEDULLARY (IM) NAIL INTERTROCHANTERIC Right 06/13/2017   Procedure: INTRAMEDULLARY (IM) NAIL INTERTROCHANTRIC;  Surgeon: Hessie Knows, MD;  Location: [facility] ORS;  Service: Orthopedics;  Laterality: Right;  . LITHOTRIPSY  2009/2010   then stent and cystoscopic removal 2014  . Oral cancer removed Right 2004  . REPAIR ZENKER'S DIVERTICULA  2013  . THYROIDECTOMY  2007  . TOE AMPUTATION Right 2010   badly overlapping other toe  . TOTAL KNEE ARTHROPLASTY Left   . TRANSURETHRAL RESECTION OF PROSTATE  2011  . UMBILICAL HERNIA REPAIR  2007  PE: Vitals:   09/09/17 1530 09/09/17 1636 09/09/17 1912 09/10/17 0443  BP: 123/62 (!) 153/71 103/61 138/70  Pulse:  79 70 76  Resp: 14 16 16 18   Temp:  97.8 F (36.6 C) (!) 97.5 F (36.4 C) 97.6 F (36.4 C)  TempSrc:  Oral Oral Oral  SpO2:  99% 94% 93%  Weight:    76.8 kg (169 lb 4.8 oz)  Height:        Intake/Output Summary (Last 24 hours) at 09/10/2017 1109 Last data filed at 09/10/2017 0900 Gross per 24 hour  Intake 2185.83 ml  Output 360 ml  Net 1825.83 ml   NAD Alert and oriented, slightly masked expression Nonlabored breathing Abdomen is soft Extremities are warm, No appreciable peripheral edema.  No appreciable tremor, excellent  strength  Recent Labs    09/09/17 1104 09/10/17 0619  WBC 8.2 6.7  HGB 14.1 12.0*  HCT 40.9 35.4*   Recent Labs    09/09/17 1104 09/10/17 0619  NA 142 143  K 4.1 3.8  CL 105 112*  CO2 29 26  GLUCOSE 114* 99  BUN 34* 28*  CREATININE 2.03* 1.96*  CALCIUM 9.1 8.0*   No results for input(s): LABPT, INR in the last 72 hours. No results for input(s): PSA in the last 72 hours. No results for input(s): LABURIN in the last 72 hours. Results for orders placed or performed during the hospital encounter of 06/12/17  Surgical pcr screen     Status: None   Collection Time: 06/13/17  6:16 AM  Result Value Ref Range Status   MRSA, PCR NEGATIVE NEGATIVE Final   Staphylococcus aureus NEGATIVE NEGATIVE Final    Comment: (NOTE) The Xpert SA Assay (FDA approved for NASAL specimens in patients 64 years of age and older), is one component of a comprehensive surveillance program. It is not intended to diagnose infection nor to guide or monitor treatment. Performed at [facility], [address] [place], [place] [address]     Images: Independently reviewed the patient's CT scan which was performed yesterday evening demonstrating 3 stones in the right distal ureter with associated hydroureteronephrosis.  Patient has a large simple cyst in the right lower pole.  Imp: The patient has obstructing stones in the right distal ureter.  His kidney function is slightly worse.  He is otherwise asymptomatic today with no significant pain, nausea, or signs/symptoms of infection.  He does have slightly progressive urinary frequency.  His hematuria is improving.  Discussion: The patient, his wife, and I had a long discussion about his treatment options.  I offered to take the patient to the operating room today for placement of the right ureteral stent.  This would essentially stop the clock on any further renal dysfunction and also minimize the renal colic symptoms.  It would create some worsening  frequency and urgency.  And possibly, make his hematuria more difficult to control.  This may require him to stop Eliquis while he waited for him to have a more definitive surgery.  We also talked about the option of holding off on anything today given that he was asymptomatic and scheduling a more definitive surgery sometime in the mid end of next week.  At the time, the patient would undergo cystoscopy, ureteroscopy with right-sided laser lithotripsy and stent placement.  I detailed the surgery for them including the associated risk and benefits.  We discussed both options and their associated pros and cons.    Ultimately, we have decided to  discharge the patient home today and try to minimize his trips to the operating room and anesthetic events.  This would also minimize his risk of developing problematic hematuria.  Would also minimize the number of times that he would have to hold Eliquis.  The patient does understand that he will need to hold the Eliquis 48 hours prior to the procedure.  He will be started on tamsulosin today.  He will also be given tramadol, if it is provided in the liquid form.  He does not need to be discharged home on antibiotics.  We discussed return precautions including worsening pain, nausea, or signs symptoms of infection which I detailed for him.  Someone will contact this patient on Monday to get his surgery scheduled for him.  I told him that it would likely be with either Dr. Bernardo Heater or Dr. Erlene Quan.

## 2017-09-10 NOTE — H&P (View-Only) (Signed)
Urology consult note  I am seeing this patient because he has a large right-sided stone burden in the distal ureter with associated hydroureteronephrosis.  He also has elevated creatinine.  Interval: The patient was made n.p.o. possible procedure today.  In the interval, the patient has not had any significant pain.  He feels fine, back to normal.  The Eliquis was held yesterday in the event that he needed to be stented today.  His hematuria has improved.  He denies any nausea or vomiting.  He denies any fevers or chills.  He denies any dysuria.  He does have urinary frequency, slightly more than usual.  He had the urge to void today, only voided a small amount, PVR demonstrated 140 cc in his bladder.  Remaining 12-pointe ROS was negative.  No current facility-administered medications on file prior to encounter.    Current Outpatient Medications on File Prior to Encounter  Medication Sig Dispense Refill  . acetaminophen (TYLENOL) 325 MG tablet Take 2 tablets (650 mg total) by mouth every 6 (six) hours as needed for mild pain or moderate pain (or Fever >/= 101).    Marland Kitchen amLODipine (NORVASC) 5 MG tablet Take 5 mg by mouth daily.    . carbidopa-levodopa (SINEMET IR) 25-100 MG tablet TAKE 1 TABLET THREE TIMES DAILY 270 tablet 3  . Cholecalciferol (VITAMIN D) 2000 UNITS CAPS Take 2,000 Units by mouth daily.     Marland Kitchen ELIQUIS 5 MG TABS tablet TAKE 1 TABLET TWICE DAILY 180 tablet 11  . levothyroxine (SYNTHROID, LEVOTHROID) 112 MCG tablet TAKE 1 TABLET EVERY DAY BEFORE BREAKFAST 90 tablet 3  . polyethylene glycol (MIRALAX / GLYCOLAX) packet Take 17 g by mouth daily.     . ranitidine (ZANTAC) 150 MG/10ML syrup Take 10 mLs (150 mg total) by mouth 2 (two) times daily. 300 mL 0  . vitamin B-12 (CYANOCOBALAMIN) 1000 MCG tablet Take 1,000 mcg by mouth daily.    Marland Kitchen docusate sodium (COLACE) 100 MG capsule Take 1 capsule (100 mg total) by mouth 2 (two) times daily. (Patient not taking: Reported on 08/20/2017) 10  capsule 0   Past Medical History:  Diagnosis Date  . ARF (acute renal failure) ([place]) 2011   after TKR  . BPH (benign prostatic hypertrophy)   . DVT (deep venous thrombosis) ([place]) 1998   after left knee arthroscopy  . Hx of colonoscopy 2000  . Hypertension   . Hypothyroid 2007   postop from thyroidectomy (cancer scare)  . Kidney stones    recurrent  . Oral cancer ([place]) 2004   graft from left thigh  . Osteoarthritis of both knees   . Parkinson's disease ([place])   . Pulmonary embolism ([place])   . Tendonitis 2012   from quinolone  . Toe amputation status ([place]) 2010   Past Surgical History:  Procedure Laterality Date  . APPENDECTOMY    . CHOLECYSTECTOMY    . HERNIA REPAIR    . INTRAMEDULLARY (IM) NAIL INTERTROCHANTERIC Right 06/13/2017   Procedure: INTRAMEDULLARY (IM) NAIL INTERTROCHANTRIC;  Surgeon: Hessie Knows, MD;  Location: [facility] ORS;  Service: Orthopedics;  Laterality: Right;  . LITHOTRIPSY  2009/2010   then stent and cystoscopic removal 2014  . Oral cancer removed Right 2004  . REPAIR ZENKER'S DIVERTICULA  2013  . THYROIDECTOMY  2007  . TOE AMPUTATION Right 2010   badly overlapping other toe  . TOTAL KNEE ARTHROPLASTY Left   . TRANSURETHRAL RESECTION OF PROSTATE  2011  . UMBILICAL HERNIA REPAIR  2007  PE: Vitals:   09/09/17 1530 09/09/17 1636 09/09/17 1912 09/10/17 0443  BP: 123/62 (!) 153/71 103/61 138/70  Pulse:  79 70 76  Resp: 14 16 16 18   Temp:  97.8 F (36.6 C) (!) 97.5 F (36.4 C) 97.6 F (36.4 C)  TempSrc:  Oral Oral Oral  SpO2:  99% 94% 93%  Weight:    76.8 kg (169 lb 4.8 oz)  Height:        Intake/Output Summary (Last 24 hours) at 09/10/2017 1109 Last data filed at 09/10/2017 0900 Gross per 24 hour  Intake 2185.83 ml  Output 360 ml  Net 1825.83 ml   NAD Alert and oriented, slightly masked expression Nonlabored breathing Abdomen is soft Extremities are warm, No appreciable peripheral edema.  No appreciable tremor, excellent  strength  Recent Labs    09/09/17 1104 09/10/17 0619  WBC 8.2 6.7  HGB 14.1 12.0*  HCT 40.9 35.4*   Recent Labs    09/09/17 1104 09/10/17 0619  NA 142 143  K 4.1 3.8  CL 105 112*  CO2 29 26  GLUCOSE 114* 99  BUN 34* 28*  CREATININE 2.03* 1.96*  CALCIUM 9.1 8.0*   No results for input(s): LABPT, INR in the last 72 hours. No results for input(s): PSA in the last 72 hours. No results for input(s): LABURIN in the last 72 hours. Results for orders placed or performed during the hospital encounter of 06/12/17  Surgical pcr screen     Status: None   Collection Time: 06/13/17  6:16 AM  Result Value Ref Range Status   MRSA, PCR NEGATIVE NEGATIVE Final   Staphylococcus aureus NEGATIVE NEGATIVE Final    Comment: (NOTE) The Xpert SA Assay (FDA approved for NASAL specimens in patients 50 years of age and older), is one component of a comprehensive surveillance program. It is not intended to diagnose infection nor to guide or monitor treatment. Performed at [facility], [address] [place], [place] [address]     Images: Independently reviewed the patient's CT scan which was performed yesterday evening demonstrating 3 stones in the right distal ureter with associated hydroureteronephrosis.  Patient has a large simple cyst in the right lower pole.  Imp: The patient has obstructing stones in the right distal ureter.  His kidney function is slightly worse.  He is otherwise asymptomatic today with no significant pain, nausea, or signs/symptoms of infection.  He does have slightly progressive urinary frequency.  His hematuria is improving.  Discussion: The patient, his wife, and I had a long discussion about his treatment options.  I offered to take the patient to the operating room today for placement of the right ureteral stent.  This would essentially stop the clock on any further renal dysfunction and also minimize the renal colic symptoms.  It would create some worsening  frequency and urgency.  And possibly, make his hematuria more difficult to control.  This may require him to stop Eliquis while he waited for him to have a more definitive surgery.  We also talked about the option of holding off on anything today given that he was asymptomatic and scheduling a more definitive surgery sometime in the mid end of next week.  At the time, the patient would undergo cystoscopy, ureteroscopy with right-sided laser lithotripsy and stent placement.  I detailed the surgery for them including the associated risk and benefits.  We discussed both options and their associated pros and cons.    Ultimately, we have decided to  discharge the patient home today and try to minimize his trips to the operating room and anesthetic events.  This would also minimize his risk of developing problematic hematuria.  Would also minimize the number of times that he would have to hold Eliquis.  The patient does understand that he will need to hold the Eliquis 48 hours prior to the procedure.  He will be started on tamsulosin today.  He will also be given tramadol, if it is provided in the liquid form.  He does not need to be discharged home on antibiotics.  We discussed return precautions including worsening pain, nausea, or signs symptoms of infection which I detailed for him.  Someone will contact this patient on Monday to get his surgery scheduled for him.  I told him that it would likely be with either Dr. Bernardo Heater or Dr. Erlene Quan.

## 2017-09-10 NOTE — Progress Notes (Signed)
IV was removed. Discharge instructions, follow-up appointments, and prescriptions were provided to the pt and son at bedside. All questions answered. The pt was taken downstairs via wheelchair by NT.

## 2017-09-10 NOTE — Discharge Summary (Signed)
[place] at [facility], [place] y[place] DOB 22-Feb-1932, MRN [id number]. Admission date: 09/09/2017 Discharge Date 09/10/2017 Primary MD Venia Carbon, MD Admitting Physician Epifanio Lesches, MD  Admission Diagnosis  Kidney stone [N20.0] Acute kidney injury superimposed on chronic kidney disease ([place]) [N17.9, N18.9]  Discharge Diagnosis   Active Problems: Acute renal failure secondary to severe right-sided hydronephrosis Hydronephrosis of right kidney due to multiple renal stone History of DVT/PE  Parkinson's disease Hypothyroidism       Hospital Course  84 year old male patient with history of DVT, DVT/PE on ELiquis , kidney stones before, Parkinson's disease, hypertension, hypothyroidism came in because of right flank pain, hematuria.  Patient was evaluated in the ER and noted to have severe right-sided hydronephrosis and acute renal failure.  Patient was kept n.p.o. for possible procedure today.  He was seen by urology Dr. Shaune Leeks who discussed with the patient and his wife stated that likely better to do everything at one time and recommended outpatient follow-up in the next few days to have the procedure done with urology.  Patient and his wife are agreeable to this plan.  We will start him on Flomax.              Consults  urology  Significant Tests:  See full reports for all details     Dg Chest 2 View  Result Date: 08/20/2017 CLINICAL DATA:  Pt with known Zenkers diverticula on right side of throat, had surgery about 10 yrs ago to reduce the size. Now with increasing dysphagia and feeling of choking when swallowing. Former smoker 82yrs EXAM: CHEST - 2 VIEW COMPARISON:  06/13/2017 FINDINGS: Cardiac silhouette is normal in size. No mediastinal or hilar masses. No evidence of adenopathy. Lungs are hyperexpanded, but clear. No pleural effusion or pneumothorax. Skeletal structures are demineralized but intact. IMPRESSION:  No active cardiopulmonary disease. Electronically Signed   By: Lajean Manes M.D.   On: 082/23/2019 13:55   Ct Renal Stone Study  Result Date: 09/09/2017 CLINICAL DATA:  Right-sided flank pain and hematuria. History of renal calculi. EXAM: CT ABDOMEN AND PELVIS WITHOUT CONTRAST TECHNIQUE: Multidetector CT imaging of the abdomen and pelvis was performed following the standard protocol without IV contrast. COMPARISON:  03/11/2016 FINDINGS: Lower chest: Scarring and atelectasis present at both lung bases. Hepatobiliary: No focal liver abnormality is seen. Status post cholecystectomy. No biliary dilatation. Pancreas: Unremarkable. No pancreatic ductal dilatation or surrounding inflammatory changes. Spleen: Normal in size without focal abnormality. Adrenals/Urinary Tract: There is evidence of severe right-sided hydronephrosis. As a dilated right ureter is followed into the pelvis, multiple calculi are present within the distal ureter. A string of 3-4 calculi are present with the largest measuring 5 mm just proximal to the UVJ. There is an impacted calculus at the UVJ measuring 8 mm in greatest diameter. There are some smaller intrarenal calculi. Cyst emanating from the lower pole of the right kidney similar to slightly larger in size and measures 8 cm. Several small nonobstructing calculi are seen scattered in the left kidney, similar in appearance to the prior study. No evidence of left-sided hydronephrosis or ureteral calculi on the left. The bladder is decompressed and contains no visible calculi. No adrenal masses. Stomach/Bowel: Bowel shows no evidence of obstruction or ileus. No free air identified. Vascular/Lymphatic: Stable aneurysmal dilatation of the aorta just below the renal arteries measuring 3.6 cm in greatest diameter. Reproductive: Stable calcifications within the prostate gland. Other: Stable left inguinal hernia containing  fat. Musculoskeletal: Stable mild degenerative disc disease of the lumbar  spine. IMPRESSION: 1. Severe right-sided hydronephrosis with multiple calculi located in the distal right ureter. There are a string of 3-4 calculi present in the distal ureter just proximal to the UVJ. These measure up to roughly 5 mm in diameter. There also is an impacted calculus at the UVJ measuring 8 mm in greatest diameter. 2. Stable mild aneurysmal dilatation of the abdominal aorta with maximal diameter of 3.6 cm. Electronically Signed   By: Aletta Edouard M.D.   On: 09/09/2017 11:35       Today   Subjective:   Walter [name] patient has no further pain. Objective:   Blood pressure 138/70, pulse 76, temperature 97.6 F (36.4 C), temperature source Oral, resp. rate 18, height 5\' 10"  (1.778 m), weight 76.8 kg (169 lb 4.8 oz), SpO2 93 %.  .  Intake/Output Summary (Last 24 hours) at 09/10/2017 1245 Last data filed at 09/10/2017 0900 Gross per 24 hour  Intake 2185.83 ml  Output 360 ml  Net 1825.83 ml    Exam VITAL SIGNS: Blood pressure 138/70, pulse 76, temperature 97.6 F (36.4 C), temperature source Oral, resp. rate 18, height 5\' 10"  (1.778 m), weight 76.8 kg (169 lb 4.8 oz), SpO2 93 %.  GENERAL:  84 y.o.-year-old patient lying in the bed with no acute distress.  EYES: Pupils equal, round, reactive to light and accommodation. No scleral icterus. Extraocular muscles intact.  HEENT: Head atraumatic, normocephalic. Oropharynx and nasopharynx clear.  NECK:  Supple, no jugular venous distention. No thyroid enlargement, no tenderness.  LUNGS: Normal breath sounds bilaterally, no wheezing, rales,rhonchi or crepitation. No use of accessory muscles of respiration.  CARDIOVASCULAR: S1, S2 normal. No murmurs, rubs, or gallops.  ABDOMEN: Soft, nontender, nondistended. Bowel sounds present. No organomegaly or mass.  EXTREMITIES: No pedal edema, cyanosis, or clubbing.  NEUROLOGIC: Cranial nerves II through XII are intact. Muscle strength 5/5 in all extremities. Sensation intact. Gait not  checked.  PSYCHIATRIC: The patient is alert and oriented x 3.  SKIN: No obvious rash, lesion, or ulcer.   Data Review     CBC w Diff:  Lab Results  Component Value Date   WBC 6.7 09/10/2017   HGB 12.0 (L) 09/10/2017   HCT 35.4 (L) 09/10/2017   PLT 186 09/10/2017   LYMPHOPCT 16 08/20/2017   MONOPCT 9 08/20/2017   EOSPCT 1 08/20/2017   BASOPCT 1 08/20/2017   CMP:  Lab Results  Component Value Date   NA 143 09/10/2017   K 3.8 09/10/2017   CL 112 (H) 09/10/2017   CO2 26 09/10/2017   BUN 28 (H) 09/10/2017   CREATININE 1.96 (H) 09/10/2017   PROT 6.5 03/10/2017   ALBUMIN 3.8 03/10/2017   BILITOT 0.8 03/10/2017   ALKPHOS 69 03/10/2017   AST 18 03/10/2017   ALT 6 (L) 03/10/2017  .  Micro Results No results found for this or any previous visit (from the past 240 hour(s)).      Code Status Orders  (From admission, onward)        Start     Ordered   09/09/17 1455  Full code  Continuous     09/09/17 1458    Code Status History    Date Active Date Inactive Code Status Order ID Comments User Context   06/12/2017 2000 06/16/2017 2017 DNR [id number]  Saundra Shelling, MD ED   03/03/2016 2223 03/05/2016 1837 Full Code [id number]  Lance Coon, MD Inpatient  Advance Directive Documentation     Most Recent Value  Type of Advance Directive  Healthcare Power of Attorney, Living will  Pre-existing out of facility DNR order (yellow form or pink MOST form)  -  "MOST" Form in Place?  -          Follow-up Information    Viviana Simpler I, MD Follow up in 6 day(s).   Specialties:  Internal Medicine, Pediatrics Contact information: [place] [id number] [phone]        urology Follow up.   Why:  dr. Judithe Modest should call him  at home for procedure          Discharge Medications   Allergies as of 09/10/2017      Reactions   Quinolones    tendonitis      Medication List    TAKE these medications   acetaminophen 325 MG  tablet Commonly known as:  TYLENOL Take 2 tablets (650 mg total) by mouth every 6 (six) hours as needed for mild pain or moderate pain (or Fever >/= 101).   amLODipine 5 MG tablet Commonly known as:  NORVASC Take 5 mg by mouth daily.   carbidopa-levodopa 25-100 MG tablet Commonly known as:  SINEMET IR TAKE 1 TABLET THREE TIMES DAILY   docusate sodium 100 MG capsule Commonly known as:  COLACE Take 1 capsule (100 mg total) by mouth 2 (two) times daily.   ELIQUIS 5 MG Tabs tablet Generic drug:  apixaban TAKE 1 TABLET TWICE DAILY   HYDROcodone-acetaminophen 7.5-325 mg/15 ml solution Commonly known as:  HYCET Take 5-10 mLs by mouth every 4 (four) hours as needed for moderate pain.   levothyroxine 112 MCG tablet Commonly known as:  SYNTHROID, LEVOTHROID TAKE 1 TABLET EVERY DAY BEFORE BREAKFAST   polyethylene glycol packet Commonly known as:  MIRALAX / GLYCOLAX Take 17 g by mouth daily.   ranitidine 150 MG/10ML syrup Commonly known as:  ZANTAC Take 10 mLs (150 mg total) by mouth 2 (two) times daily.   tamsulosin 0.4 MG Caps capsule Commonly known as:  FLOMAX Take 1 capsule (0.4 mg total) by mouth daily.   vitamin B-12 1000 MCG tablet Commonly known as:  CYANOCOBALAMIN Take 1,000 mcg by mouth daily.   Vitamin D 2000 units Caps Take 2,000 Units by mouth daily.          Total Time in preparing paper work, data evaluation and todays exam - 49 minutes  Dustin Flock M.D on 09/10/2017 at 12:45 PM [place]  [phone]

## 2017-09-10 NOTE — Consult Note (Signed)
Urology Consult  Referring physician: Dr. Posey Pronto Reason for referral: right ureteral calculi  Chief Complaint: Gross hematuria  History of Present Illness: Walter [name] is a 84yo with a history of BPH, Parkinsons, PE on eliquis, and nephrolithiasis who was admitted with gross hematuria, right flank pain, and elevated creatinine. 3 days ago he developed gross painless hematuria which usually accompanies his stone events. His last stone events was 4 years ago in [place]. Today he developed sharp, constant moderate nonradiitng right flank pain and presented to [facility] ER. He underwent CT which showed a 42m R UVJ calculus with 2 5-76mcalculi proximal to the calculus and moderate hydronephrosis. Creatinine was 2 on admission with a baseline around 1.5-1.6. Right flank pain has resolved. Patient was previous treated with ESWL and ureteroscopy. No worsening LUTS. NO other associated symptoms. No exacerbating/allevtating events.    Past Medical History:  Diagnosis Date  . [place]) [place]   after TKR  . BPH (benign prostatic hypertrophy)   . DVT (deep [place] thrombosis) ([place]   after left knee arthroscopy  . Hx of colonoscopy 2000  . Hypertension   . Hypothyroid 2007   postop from thyroidectomy (cancer scare)  . Kidney stones    recurrent  . Oral cancer ([place]   graft from left thigh  . Osteoarthritis of both knees   . Parkinson's disease ([place]  . Pulmonary embolism ([place]  . Tendonitis 2012   from quinolone  . Toe amputation status ([place]   Past Surgical History:  Procedure Laterality Date  . APPENDECTOMY    . CHOLECYSTECTOMY    . HERNIA REPAIR    . INTRAMEDULLARY (IM) NAIL INTERTROCHANTERIC Right 06/13/2017   Procedure: INTRAMEDULLARY (IM) NAIL INTERTROCHANTRIC;  Surgeon: MeHessie KnowsMD;  Location: [facility] ORS;  Service: Orthopedics;  Laterality: Right;  . LITHOTRIPSY  2009/2010   then stent and cystoscopic removal 2014  . Oral cancer removed Right 2004  .  REPAIR ZENKER'S DIVERTICULA  2013  . THYROIDECTOMY  2007  . TOE AMPUTATION Right 2010   badly overlapping other toe  . TOTAL KNEE ARTHROPLASTY Left   . TRANSURETHRAL RESECTION OF PROSTATE  2011  . UMBILICAL HERNIA REPAIR  2007    Medications: I have reviewed the patient's current medications. Allergies:  Allergies  Allergen Reactions  . Quinolones     tendonitis    Family History  Problem Relation Age of Onset  . Dementia Mother   . Stroke Father   . Pulmonary disease Brother   . Heart disease Neg Hx   . Diabetes Neg Hx    Social History:  reports that he quit smoking about 15 years ago. His smoking use included cigarettes. He has never used smokeless tobacco. He reports that he drinks alcohol. He reports that he does not use drugs.  Review of Systems  Genitourinary: Positive for flank pain and hematuria.  All other systems reviewed and are negative.   Physical Exam:  Vital signs in last 24 hours: Temp:  [97.5 F (36.4 C)-98.3 F (36.8 C)] 97.6 F (36.4 C) (07/14 0443) Pulse Rate:  [65-79] 76 (07/14 0443) Resp:  [13-22] 18 (07/14 0443) BP: (103-153)/(61-99) 138/70 (07/14 0443) SpO2:  [90 %-100 %] 93 % (07/14 0443) Weight:  [76.8 kg (169 lb 4.8 oz)-77.1 kg (170 lb)] 76.8 kg (169 lb 4.8 oz) (07/14 0443) Physical Exam  Constitutional: He is oriented to person, place, and time. He appears well-developed and well-nourished.  HENT:  Head:  Normocephalic and atraumatic.  Eyes: Pupils are equal, round, and reactive to light. EOM are normal.  Neck: Normal range of motion. No thyromegaly present.  Cardiovascular: Normal rate and regular rhythm.  Respiratory: Effort normal. No respiratory distress.  GI: Soft. He exhibits no distension.  Musculoskeletal: Normal range of motion. He exhibits no edema.  Neurological: He is alert and oriented to person, place, and time.  Skin: Skin is warm and dry.  Psychiatric: He has a normal mood and affect. His behavior is normal. Judgment  and thought content normal.    Laboratory Data:  Results for orders placed or performed during the hospital encounter of 09/09/17 (from the past 72 hour(s))  Urinalysis, Complete w Microscopic     Status: Abnormal   Collection Time: 09/09/17 11:04 AM  Result Value Ref Range   Color, Urine YELLOW (A) YELLOW   APPearance CLOUDY (A) CLEAR   Specific Gravity, Urine 1.021 1.005 - 1.030   pH 5.0 5.0 - 8.0   Glucose, UA NEGATIVE NEGATIVE mg/dL   Hgb urine dipstick LARGE (A) NEGATIVE   Bilirubin Urine NEGATIVE NEGATIVE   Ketones, ur NEGATIVE NEGATIVE mg/dL   Protein, ur 100 (A) NEGATIVE mg/dL   Nitrite NEGATIVE NEGATIVE   Leukocytes, UA TRACE (A) NEGATIVE   RBC / HPF >50 (H) 0 - 5 RBC/hpf   WBC, UA >50 (H) 0 - 5 WBC/hpf   Bacteria, UA RARE (A) NONE SEEN   Squamous Epithelial / LPF NONE SEEN 0 - 5   Mucus PRESENT    Hyaline Casts, UA PRESENT    Ca Oxalate Crys, UA PRESENT     Comment: Performed at [facility], [address] [place], [place] [address]  Basic metabolic panel     Status: Abnormal   Collection Time: 09/09/17 11:04 AM  Result Value Ref Range   Sodium 142 135 - 145 mmol/L   Potassium 4.1 3.5 - 5.1 mmol/L   Chloride 105 98 - 111 mmol/L    Comment: Please note change in reference range.   CO2 29 22 - 32 mmol/L   Glucose, Bld 114 (H) 70 - 99 mg/dL    Comment: Please note change in reference range.   BUN 34 (H) 8 - 23 mg/dL    Comment: Please note change in reference range.   Creatinine, Ser 2.03 (H) 0.61 - 1.24 mg/dL   Calcium 9.1 8.9 - 10.3 mg/dL   GFR calc non Af Amer 28 (L) >60 mL/min   GFR calc Af Amer 33 (L) >60 mL/min    Comment: (NOTE) The eGFR has been calculated using the CKD EPI equation. This calculation has not been validated in all clinical situations. eGFR's persistently <60 mL/min signify possible Chronic Kidney Disease.    Anion gap 8 5 - 15    Comment: Performed at [facility], [address] [place], [place] [address]  CBC      Status: None   Collection Time: 09/09/17 11:04 AM  Result Value Ref Range   WBC 8.2 3.8 - 10.6 K/uL   RBC 4.53 4.40 - 5.90 MIL/uL   Hemoglobin 14.1 13.0 - 18.0 g/dL   HCT 40.9 40.0 - 52.0 %   MCV 90.4 80.0 - 100.0 fL   MCH 31.1 26.0 - 34.0 pg   MCHC 34.4 32.0 - 36.0 g/dL   RDW 14.3 11.5 - 14.5 %   Platelets 214 150 - 440 K/uL    Comment: Performed at [facility], [address]  [place], [place] [address]   No results found for this or any previous visit (from the past 240 hour(s)). Creatinine: Recent Labs    09/09/17 1104  CREATININE 2.03*   Baseline Creatinine: 1.5-1.6  Impression/Assessment:  84yo with multiple distal ureteral calculi, gross hematuria, and elevated creatine  Plan:  1. Ureteral calculi: I discussed the management of his ureteral calculi including medical expulsive therapy versus ureteral stent placement/nephrostoym tube placement with subsequent ureteroscopic stone extraction at a later date. The patient wishes to attempt medical expulsive therapy. However if his hematuria, pain, or creatinine worsens we will elect to proceed with ureteral stent placement. Please make him NPO after midnight for possible ureteral stent placement tomorrow. Please recheck creatinine Sunday morning  Nicolette Bang 09/10/2017, 4:58 AM

## 2017-09-11 ENCOUNTER — Other Ambulatory Visit: Payer: Self-pay | Admitting: Radiology

## 2017-09-11 ENCOUNTER — Telehealth: Payer: Self-pay | Admitting: *Deleted

## 2017-09-11 ENCOUNTER — Encounter
Admission: RE | Admit: 2017-09-11 | Discharge: 2017-09-11 | Disposition: A | Discharge status: Home / Self Care | Payer: Medicare HMO | Source: Ambulatory Visit | Attending: Urology | Admitting: Urology

## 2017-09-11 ENCOUNTER — Other Ambulatory Visit: Payer: Self-pay

## 2017-09-11 ENCOUNTER — Ambulatory Visit (INDEPENDENT_AMBULATORY_CARE_PROVIDER_SITE_OTHER): Payer: Medicare HMO | Admitting: Internal Medicine

## 2017-09-11 ENCOUNTER — Encounter: Payer: Self-pay | Admitting: Internal Medicine

## 2017-09-11 ENCOUNTER — Telehealth: Payer: Self-pay

## 2017-09-11 VITALS — BP 122/78 | HR 80 | Temp 98.1°F | Ht 70.0 in | Wt 170.0 lb

## 2017-09-11 DIAGNOSIS — N1330 Unspecified hydronephrosis: Secondary | ICD-10-CM

## 2017-09-11 DIAGNOSIS — G20 Parkinson's disease: Secondary | ICD-10-CM

## 2017-09-11 DIAGNOSIS — N200 Calculus of kidney: Secondary | ICD-10-CM

## 2017-09-11 DIAGNOSIS — I2692 Saddle embolus of pulmonary artery without acute cor pulmonale: Secondary | ICD-10-CM

## 2017-09-11 NOTE — Telephone Encounter (Signed)
This is not surprising with kidney stone. Not sure why they postponed procedure Will review at [place] today

## 2017-09-11 NOTE — Telephone Encounter (Signed)
Spoke with Amy @ [facility] to have her refax form for surgical clearance  Gaspar Bidding, NP completed form and it was faxed back @ 12:01 pm.

## 2017-09-11 NOTE — Assessment & Plan Note (Signed)
Discussed concerns about anaesthesia in Parkinson's patients Best limiting to just one procedure

## 2017-09-11 NOTE — Telephone Encounter (Signed)
PLEASE NOTE: All timestamps contained within this report are represented as [place] Standard Time. CONFIDENTIALTY NOTICE: This fax transmission is intended only for the addressee. It contains information that is legally privileged, confidential or otherwise protected from use or disclosure. If you are not the intended recipient, you are strictly prohibited from reviewing, disclosing, copying using or disseminating any of this information or taking any action in reliance on or regarding this information. If you have received this fax in error, please notify us immediately by telephone so that we can arrange for its return to [place]. Phone: [phone], Toll-Free: [phone], Fax: [phone] Page: 1 of 1 Call Id: [id number] Box Night - Client Nonclinical Telephone Record [place] Night - Client Client Site [place] Physician Viviana Simpler - MD Contact Type Call Who Is Calling Patient / Member / Family / Caregiver Caller Name Walter [name] Phone Number Patient Name Walter [name] Patient DOB Call Type Message Only Information Provided Reason for Call Request to Schedule Office Appointment Initial Comment Caller states her husband needs to be seen. Is on eloquis and has a fair amount of blood in urine. Transferred to [facility] office per request. Additional Comment Call Closed By: Kathlynn Grate Transaction Date/Time: 09/09/2017 9:27:38 AM (ET)

## 2017-09-11 NOTE — Progress Notes (Signed)
Subjective:    Patient ID: Walter [name], male    DOB: 04-22-1931, 84 y.o.   MRN: [id number]  HPI Here with wife for hospitalization  Had been having some hematuria--but then it worsened Passing almost pure blood Wife brought him to ER Records reviewed  Has kidney stone ---with hydronephrosis Decided to hold off on therapy (stent) until definitive therapy can also be done Now planned for procedure ---- in 2 days Still with intermittent hematuria Voiding some but not much  No cough or SOB No fever  Current Outpatient Medications on File Prior to Visit  Medication Sig Dispense Refill  . acetaminophen (TYLENOL) 325 MG tablet Take 2 tablets (650 mg total) by mouth every 6 (six) hours as needed for mild pain or moderate pain (or Fever >/= 101).    Marland Kitchen amLODipine (NORVASC) 5 MG tablet Take 5 mg by mouth daily.    . carbidopa-levodopa (SINEMET IR) 25-100 MG tablet TAKE 1 TABLET THREE TIMES DAILY 270 tablet 3  . Cholecalciferol (VITAMIN D) 2000 UNITS CAPS Take 2,000 Units by mouth daily.     Marland Kitchen ELIQUIS 5 MG TABS tablet TAKE 1 TABLET TWICE DAILY 180 tablet 11  . HYDROcodone-acetaminophen (HYCET) 7.5-325 mg/15 ml solution Take 5-10 mLs by mouth every 4 (four) hours as needed for moderate pain. 60 mL 0  . levothyroxine (SYNTHROID, LEVOTHROID) 112 MCG tablet TAKE 1 TABLET EVERY DAY BEFORE BREAKFAST 90 tablet 3  . polyethylene glycol (MIRALAX / GLYCOLAX) packet Take 17 g by mouth daily.     . tamsulosin (FLOMAX) 0.4 MG CAPS capsule Take 1 capsule (0.4 mg total) by mouth daily. 30 capsule 0  . vitamin B-12 (CYANOCOBALAMIN) 1000 MCG tablet Take 1,000 mcg by mouth daily.     No current facility-administered medications on file prior to visit.     Allergies  Allergen Reactions  . Quinolones     tendonitis    Past Medical History:  Diagnosis Date  . ARF (acute renal failure) ([place]) 2011   after TKR  . BPH (benign prostatic hypertrophy)   . DVT (deep venous thrombosis) ([place]) 1998   after left  knee arthroscopy  . Hx of colonoscopy 2000  . Hypertension   . Hypothyroid 2007   postop from thyroidectomy (cancer scare)  . Kidney stones    recurrent  . Oral cancer ([place]) 2004   graft from left thigh  . Osteoarthritis of both knees   . Parkinson's disease ([place])   . Pulmonary embolism ([place])   . Tendonitis 2012   from quinolone  . Toe amputation status ([place]) 2010    Past Surgical History:  Procedure Laterality Date  . APPENDECTOMY    . CHOLECYSTECTOMY    . HERNIA REPAIR    . INTRAMEDULLARY (IM) NAIL INTERTROCHANTERIC Right 06/13/2017   Procedure: INTRAMEDULLARY (IM) NAIL INTERTROCHANTRIC;  Surgeon: Hessie Knows, MD;  Location: [facility] ORS;  Service: Orthopedics;  Laterality: Right;  . LITHOTRIPSY  2009/2010   then stent and cystoscopic removal 2014  . Oral cancer removed Right 2004  . REPAIR ZENKER'S DIVERTICULA  2013  . THYROIDECTOMY  2007  . TOE AMPUTATION Right 2010   badly overlapping other toe  . TOTAL KNEE ARTHROPLASTY Left   . TRANSURETHRAL RESECTION OF PROSTATE  2011  . UMBILICAL HERNIA REPAIR  2007    Family History  Problem Relation Age of Onset  . Dementia Mother   . Stroke Father   . Pulmonary disease Brother   . Heart disease Neg Hx   .  Diabetes Neg Hx     Social History   Socioeconomic History  . Marital status: Married    Spouse name: Not on file  . Number of children: 3  . Years of education: Not on file  . Highest education level: Not on file  Occupational History  . 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 Financial resource strain: Not on file  . Food insecurity:    Worry: Not on file    Inability: Not on file  . Transportation needs:    Medical: Not on file    Non-medical: Not on file  Tobacco Use  . Smoking status: Former Smoker    Types: Cigarettes    Last attempt to quit: 04/27/2002    Years since quitting: 15.3  . Smokeless tobacco: Never Used    Substance and Sexual Activity  . Alcohol use: Not Currently    Alcohol/week: 0.0 oz    Comment: one drink daily (wine or scotch)  . Drug use: No  . Sexual activity: Never  Lifestyle  . Physical activity:    Days per week: Not on file    Minutes per session: Not on file  . Stress: Not on file  Relationships  . Social connections:    Talks on phone: Not on file    Gets together: Not on file    Attends religious service: Not on file    Active member of club or organization: Not on file    Attends meetings of clubs or organizations: Not on file    Relationship status: Not on file  . Intimate partner violence:    Fear of current or ex partner: Not on file    Emotionally abused: Not on file    Physically abused: Not on file    Forced sexual activity: Not on file  Other Topics Concern  . Not on file  Social History Narrative   Son works for [employer]   1 daughter in [place], other in [place]      Has living will   Wife, then son, would be health care POA   He isn't sure about DNR--will accept resuscitation for now   No tube feeds if cognitively unaware   Review of Systems No nausea Eating okay Strength in legs has really regressed---trouble transferring in/out of car, bed, etc    Objective:   Physical Exam  Neck: No thyromegaly present.  Cardiovascular: Normal rate, regular rhythm and normal heart sounds. Exam reveals no gallop.  No murmur heard. Respiratory: Effort normal and breath sounds normal. No respiratory distress. He has no wheezes. He has no rales.  GI:  Mild RLQ tenderness  Musculoskeletal: He exhibits no edema.  Lymphadenopathy:    He has no cervical adenopathy.           Assessment & Plan:

## 2017-09-11 NOTE — Telephone Encounter (Signed)
Pt was admitted to [facility] 09/09/17 and d/c 09/10/17. Pt has f/u appt with Dr Silvio Pate on 09/11/17 at 2:45.

## 2017-09-11 NOTE — Patient Instructions (Signed)
Your procedure is scheduled on: Wednesday, September 13, 2017 Report to Day Surgery on the 2nd floor of the [facility]. To find out your arrival time, please call [phone] between 1PM - 3PM on: Tuesday, September 12, 2017  REMEMBER: Instructions that are not followed completely may result in serious medical risk, up to and including death; or upon the discretion of your surgeon and anesthesiologist your surgery may need to be rescheduled.  Do not eat food after midnight the night before your procedure.  No gum chewing, lozengers or hard candies.  You may however, drink CLEAR liquids up to 2 hours before you are scheduled to arrive for your surgery. Do not drink anything within 2 hours of the start of your surgery.  Clear liquids include: - water  - apple juice without pulp - clear gatorade - black coffee or tea (Do NOT add anything to the coffee or tea) Do NOT drink anything that is not on this list.  No Alcohol for 24 hours before or after surgery.  No Smoking including e-cigarettes for 24 hours prior to surgery.  No chewable tobacco products for at least 6 hours prior to surgery.  No nicotine patches on the day of surgery.  On the morning of surgery brush your teeth with toothpaste and water, you may rinse your mouth with mouthwash if you wish. Do not swallow any toothpaste or mouthwash.  Notify your doctor if there is any change in your medical condition (cold, fever, infection).  Do not wear jewelry, make-up, hairpins, clips or nail polish.  Do not wear lotions, powders, or perfumes. You may wear deodorant.  Do not shave 48 hours prior to surgery. Men may shave face and neck.  Contacts and dentures may not be worn into surgery.  Do not bring valuables to the hospital, including drivers license, insurance or credit cards.  [facility] is not responsible for any belongings or valuables.   TAKE THESE MEDICATIONS THE MORNING OF SURGERY:  1.  Amlodipine 2.   Carbidopa-levodopa 3.  Hydrocodone (if needed for pain) 4.  Levothyroxine 5.  Tamsulosin  Follow recommendations from Cardiologist, Pulmonologist or PCP regarding stopping Eliquis. STOP NOW!  NOW!  Stop Anti-inflammatories (NSAIDS) such as Advil, Aleve, Ibuprofen, Motrin, Naproxen, Naprosyn and Aspirin based products such as Excedrin, Goodys Powder, BC Powder. (May take Tylenol or Acetaminophen if needed.)  NOW!  Stop ANY OVER THE COUNTER supplements until after surgery. (May continue Vitamin D, Vitamin B.)  Wear comfortable clothing (specific to your surgery type) to the hospital.  Plan for stool softeners for home use.  If you are being discharged the day of surgery, you will not be allowed to drive home. You will need a responsible adult to drive you home and stay with you that night.   Please call [phone] if you have any questions about these instructions.

## 2017-09-11 NOTE — Telephone Encounter (Signed)
Entered in error

## 2017-09-11 NOTE — Assessment & Plan Note (Signed)
Not nauseated, febrile or worsening AKI Seems reasonable to wait for combined procedure Hematuria is better but persists Holding the eliquis as of today

## 2017-09-11 NOTE — Assessment & Plan Note (Signed)
No chest pain or SOB Okay to hold eliquis for upcoming procedure

## 2017-09-13 ENCOUNTER — Ambulatory Visit: Payer: Medicare HMO | Admitting: Anesthesiology

## 2017-09-13 ENCOUNTER — Encounter
Admission: RE | Disposition: A | Discharge status: Skilled Nursing Facility | Payer: Self-pay | Source: Ambulatory Visit | Attending: Urology

## 2017-09-13 ENCOUNTER — Ambulatory Visit
Admission: RE | Admit: 2017-09-13 | Discharge: 2017-09-13 | Disposition: A | Discharge status: Skilled Nursing Facility | Payer: Medicare HMO | Source: Ambulatory Visit | Attending: Urology | Admitting: Urology

## 2017-09-13 ENCOUNTER — Encounter: Payer: Self-pay | Admitting: Anesthesiology

## 2017-09-13 DIAGNOSIS — N1330 Unspecified hydronephrosis: Secondary | ICD-10-CM

## 2017-09-13 DIAGNOSIS — Z7901 Long term (current) use of anticoagulants: Secondary | ICD-10-CM | POA: Diagnosis not present

## 2017-09-13 DIAGNOSIS — Z86718 Personal history of other venous thrombosis and embolism: Secondary | ICD-10-CM | POA: Insufficient documentation

## 2017-09-13 DIAGNOSIS — I739 Peripheral vascular disease, unspecified: Secondary | ICD-10-CM | POA: Diagnosis not present

## 2017-09-13 DIAGNOSIS — N200 Calculus of kidney: Secondary | ICD-10-CM

## 2017-09-13 DIAGNOSIS — Z87891 Personal history of nicotine dependence: Secondary | ICD-10-CM | POA: Insufficient documentation

## 2017-09-13 DIAGNOSIS — G20 Parkinson's disease: Secondary | ICD-10-CM | POA: Insufficient documentation

## 2017-09-13 DIAGNOSIS — Z86711 Personal history of pulmonary embolism: Secondary | ICD-10-CM | POA: Insufficient documentation

## 2017-09-13 DIAGNOSIS — N132 Hydronephrosis with renal and ureteral calculous obstruction: Secondary | ICD-10-CM | POA: Diagnosis not present

## 2017-09-13 DIAGNOSIS — Z87442 Personal history of urinary calculi: Secondary | ICD-10-CM | POA: Diagnosis not present

## 2017-09-13 DIAGNOSIS — I10 Essential (primary) hypertension: Secondary | ICD-10-CM | POA: Diagnosis not present

## 2017-09-13 DIAGNOSIS — Z96652 Presence of left artificial knee joint: Secondary | ICD-10-CM | POA: Insufficient documentation

## 2017-09-13 DIAGNOSIS — Z89421 Acquired absence of other right toe(s): Secondary | ICD-10-CM | POA: Insufficient documentation

## 2017-09-13 DIAGNOSIS — Z79899 Other long term (current) drug therapy: Secondary | ICD-10-CM | POA: Diagnosis not present

## 2017-09-13 DIAGNOSIS — Z79890 Hormone replacement therapy (postmenopausal): Secondary | ICD-10-CM | POA: Diagnosis not present

## 2017-09-13 DIAGNOSIS — E039 Hypothyroidism, unspecified: Secondary | ICD-10-CM | POA: Diagnosis not present

## 2017-09-13 DIAGNOSIS — Z85819 Personal history of malignant neoplasm of unspecified site of lip, oral cavity, and pharynx: Secondary | ICD-10-CM | POA: Diagnosis not present

## 2017-09-13 DIAGNOSIS — E890 Postprocedural hypothyroidism: Secondary | ICD-10-CM | POA: Insufficient documentation

## 2017-09-13 DIAGNOSIS — N400 Enlarged prostate without lower urinary tract symptoms: Secondary | ICD-10-CM | POA: Diagnosis not present

## 2017-09-13 DIAGNOSIS — N201 Calculus of ureter: Secondary | ICD-10-CM | POA: Diagnosis not present

## 2017-09-13 DIAGNOSIS — N183 Chronic kidney disease, stage 3 (moderate): Secondary | ICD-10-CM | POA: Diagnosis not present

## 2017-09-13 DIAGNOSIS — I129 Hypertensive chronic kidney disease with stage 1 through stage 4 chronic kidney disease, or unspecified chronic kidney disease: Secondary | ICD-10-CM | POA: Diagnosis not present

## 2017-09-13 HISTORY — PX: CYSTOSCOPY/URETEROSCOPY/HOLMIUM LASER/STENT PLACEMENT: SHX6546

## 2017-09-13 SURGERY — CYSTOSCOPY/URETEROSCOPY/HOLMIUM LASER/STENT PLACEMENT
Anesthesia: General | Site: Ureter | Laterality: Right | Wound class: Clean Contaminated

## 2017-09-13 MED ORDER — FENTANYL CITRATE (PF) 100 MCG/2ML IJ SOLN
INTRAMUSCULAR | Status: AC
  Administered 2017-09-13: 25 ug
  Filled 2017-09-13: qty 2

## 2017-09-13 MED ORDER — FAMOTIDINE 20 MG PO TABS
ORAL_TABLET | ORAL | Status: AC
  Filled 2017-09-13: qty 1

## 2017-09-13 MED ORDER — IOTHALAMATE MEGLUMINE 43 % IV SOLN
INTRAVENOUS | Status: DC | PRN
  Administered 2017-09-13: 15 mL via URETHRAL

## 2017-09-13 MED ORDER — DEXAMETHASONE SODIUM PHOSPHATE 10 MG/ML IJ SOLN
INTRAMUSCULAR | Status: DC | PRN
  Administered 2017-09-13: 4 mg via INTRAVENOUS

## 2017-09-13 MED ORDER — DEXAMETHASONE SODIUM PHOSPHATE 10 MG/ML IJ SOLN
INTRAMUSCULAR | Status: AC
  Filled 2017-09-13: qty 1

## 2017-09-13 MED ORDER — ONDANSETRON HCL 4 MG/2ML IJ SOLN
INTRAMUSCULAR | Status: DC | PRN
  Administered 2017-09-13: 4 mg via INTRAVENOUS

## 2017-09-13 MED ORDER — CEFAZOLIN SODIUM-DEXTROSE 2-4 GM/100ML-% IV SOLN
2.0000 g | INTRAVENOUS | Status: AC
  Administered 2017-09-13: 2 g via INTRAVENOUS

## 2017-09-13 MED ORDER — FENTANYL CITRATE (PF) 100 MCG/2ML IJ SOLN
25.0000 ug | INTRAMUSCULAR | Status: AC | PRN
  Administered 2017-09-13 (×6): 25 ug via INTRAVENOUS

## 2017-09-13 MED ORDER — PROPOFOL 10 MG/ML IV BOLUS
INTRAVENOUS | Status: AC
Start: 2017-09-13 — End: ?
  Filled 2017-09-13: qty 20

## 2017-09-13 MED ORDER — PROPOFOL 10 MG/ML IV BOLUS
INTRAVENOUS | Status: DC | PRN
  Administered 2017-09-13: 130 mg via INTRAVENOUS
  Administered 2017-09-13: 70 mg via INTRAVENOUS

## 2017-09-13 MED ORDER — LIDOCAINE HCL (CARDIAC) PF 100 MG/5ML IV SOSY
PREFILLED_SYRINGE | INTRAVENOUS | Status: DC | PRN
  Administered 2017-09-13: 100 mg via INTRAVENOUS

## 2017-09-13 MED ORDER — FENTANYL CITRATE (PF) 100 MCG/2ML IJ SOLN
INTRAMUSCULAR | Status: AC
  Filled 2017-09-13: qty 2

## 2017-09-13 MED ORDER — LACTATED RINGERS IV SOLN
INTRAVENOUS | Status: DC
  Administered 2017-09-13: 14:00:00 via INTRAVENOUS

## 2017-09-13 MED ORDER — OXYCODONE HCL 5 MG/5ML PO SOLN
ORAL | Status: AC
  Filled 2017-09-13: qty 5

## 2017-09-13 MED ORDER — LIDOCAINE HCL (PF) 2 % IJ SOLN
INTRAMUSCULAR | Status: AC
  Filled 2017-09-13: qty 10

## 2017-09-13 MED ORDER — OXYCODONE HCL 5 MG/5ML PO SOLN
5.0000 mg | Freq: Once | ORAL | Status: AC | PRN
  Administered 2017-09-13: 5 mg via ORAL

## 2017-09-13 MED ORDER — FENTANYL CITRATE (PF) 100 MCG/2ML IJ SOLN
INTRAMUSCULAR | Status: DC | PRN
  Administered 2017-09-13: 25 ug via INTRAVENOUS
  Administered 2017-09-13: 50 ug via INTRAVENOUS
  Administered 2017-09-13: 25 ug via INTRAVENOUS

## 2017-09-13 MED ORDER — CEFAZOLIN SODIUM-DEXTROSE 2-4 GM/100ML-% IV SOLN
INTRAVENOUS | Status: AC
  Filled 2017-09-13: qty 100

## 2017-09-13 MED ORDER — ONDANSETRON HCL 4 MG/2ML IJ SOLN
INTRAMUSCULAR | Status: AC
  Filled 2017-09-13: qty 2

## 2017-09-13 MED ORDER — CARBIDOPA-LEVODOPA 25-100 MG PO TABS
1.0000 | ORAL_TABLET | Freq: Three times a day (TID) | ORAL | Status: DC
  Administered 2017-09-13: 1 via ORAL
  Filled 2017-09-13 (×6): qty 1

## 2017-09-13 MED ORDER — PHENYLEPHRINE HCL 10 MG/ML IJ SOLN
INTRAMUSCULAR | Status: DC | PRN
  Administered 2017-09-13: 50 ug via INTRAVENOUS
  Administered 2017-09-13 (×2): 100 ug via INTRAVENOUS

## 2017-09-13 MED ORDER — FAMOTIDINE 20 MG PO TABS: 20.0000 mg | ORAL_TABLET | Freq: Once | ORAL | Status: DC

## 2017-09-13 MED ORDER — OXYCODONE HCL 5 MG PO TABS: 5.0000 mg | ORAL_TABLET | Freq: Once | ORAL | Status: AC | PRN

## 2017-09-13 SURGICAL SUPPLY — 30 items
BAG DRAIN CYSTO-URO LG1000N (MISCELLANEOUS) ×3 IMPLANT
BASKET ZERO TIP 1.9FR (BASKET) ×3 IMPLANT
BRUSH SCRUB EZ 1% IODOPHOR (MISCELLANEOUS) ×3 IMPLANT
CATH URETL 5X70 OPEN END (CATHETERS) ×3 IMPLANT
CNTNR SPEC 2.5X3XGRAD LEK (MISCELLANEOUS) ×1
CONRAY 43 FOR UROLOGY 50M (MISCELLANEOUS) ×3 IMPLANT
CONT SPEC 4OZ STER OR WHT (MISCELLANEOUS) ×2
CONTAINER SPEC 2.5X3XGRAD LEK (MISCELLANEOUS) ×1 IMPLANT
DRAPE UTILITY 15X26 TOWEL STRL (DRAPES) ×3 IMPLANT
FIBER LASER LITHO 273 (Laser) ×3 IMPLANT
GLIDEWIRE STIFF .35X180X3 HYDR (WIRE) ×3 IMPLANT
GLOVE BIO SURGEON STRL SZ 6.5 (GLOVE) ×2 IMPLANT
GLOVE BIO SURGEONS STRL SZ 6.5 (GLOVE) ×1
GOWN STRL REUS W/ TWL LRG LVL3 (GOWN DISPOSABLE) ×2 IMPLANT
GOWN STRL REUS W/TWL LRG LVL3 (GOWN DISPOSABLE) ×4
GUIDEWIRE GREEN .038 145CM (MISCELLANEOUS) IMPLANT
INFUSOR MANOMETER BAG 3000ML (MISCELLANEOUS) ×3 IMPLANT
INTRODUCER DILATOR DOUBLE (INTRODUCER) IMPLANT
KIT TURNOVER CYSTO (KITS) ×3 IMPLANT
PACK CYSTO AR (MISCELLANEOUS) ×3 IMPLANT
SENSORWIRE 0.038 NOT ANGLED (WIRE) ×3
SET CYSTO W/LG BORE CLAMP LF (SET/KITS/TRAYS/PACK) ×3 IMPLANT
SHEATH URETERAL 12FRX35CM (MISCELLANEOUS) IMPLANT
SOL .9 NS 3000ML IRR  AL (IV SOLUTION) ×2
SOL .9 NS 3000ML IRR UROMATIC (IV SOLUTION) ×1 IMPLANT
STENT URET 6FRX24 CONTOUR (STENTS) IMPLANT
STENT URET 6FRX26 CONTOUR (STENTS) IMPLANT
SURGILUBE 2OZ TUBE FLIPTOP (MISCELLANEOUS) ×3 IMPLANT
WATER STERILE IRR 1000ML POUR (IV SOLUTION) ×3 IMPLANT
WIRE SENSOR 0.038 NOT ANGLED (WIRE) ×1 IMPLANT

## 2017-09-13 NOTE — Progress Notes (Signed)
In PACU consistently c/o wanting to straighten legs, have legs rubbed, "hold my hand", etc.  Brought pt's wife into the recovery room for comfort.  Cont giving Fentanyl for pain along with liquid IR Roxicet per order.  Assist pt up to recliner chair.  He was able to bear wt, void and sitting up in chair with RA sats of 94%.  States pain is "better".  Pt's wife does admit he has high anxiety.  Plan is to d/c pt to [facility] this evening.  Bubba Camp, RN

## 2017-09-13 NOTE — Anesthesia Procedure Notes (Signed)
Procedure Name: LMA Insertion Date/Time: 09/13/2017 1:57 PM Performed by: Rudean Hitt, CRNA Pre-anesthesia Checklist: Patient identified, Patient being monitored, Timeout performed, Emergency Drugs available and Suction available Patient Re-evaluated:Patient Re-evaluated prior to induction Oxygen Delivery Method: Circle system utilized Preoxygenation: Pre-oxygenation with 100% oxygen Induction Type: IV induction Ventilation: Mask ventilation without difficulty LMA: LMA inserted LMA Size: 5.0 Number of attempts: 2 (Lma size4.5 did not seat well. Successful with Lma size 5) Placement Confirmation: positive ETCO2 and breath sounds checked- equal and bilateral Tube secured with: Tape Dental Injury: Teeth and Oropharynx as per pre-operative assessment

## 2017-09-13 NOTE — Op Note (Signed)
Date of procedure: 09/13/17  Preoperative diagnosis:  1. Right distal ureteral calculus 2. Severe right hydroureteronephrosis  Postoperative diagnosis:  1. Same as above 2. Impacted stone 3. Ureteral perforation  Procedure: 1. Right ureteroscopy 2. Laser lithotripsy 3. Right ureteral stent placement 4. Right retrograde pyelogram 5. Basket extraction of stone fragment  Surgeon: Hollice Espy, MD  Anesthesia: General  Complications: None  Intraoperative findings: Impacted right distal ureteral stone just at the opening of the UO, initial small wire perforation appreciated, ultimately able to pass a safety wire around the stone, treat the stone, and remove all fragments safely.  Small mucosal flap with a distal ureter.   EBL: Minimal  Specimens: Stone fragment  Drains: 6 x 26 French double-J ureteral stone right  Indication: Cleophus Mendonsa is a 84 y.o. patient with severe right hydroureteronephrosis secondary to an 8 mm right UVJ stone as well as multiple additional stones just proximal to this.  After reviewing the management options for treatment, he elected to proceed with the above surgical procedure(s). We have discussed the potential benefits and risks of the procedure, side effects of the proposed treatment, the likelihood of the patient achieving the goals of the procedure, and any potential problems that might occur during the procedure or recuperation. Informed consent has been obtained.  Description of procedure:  The patient was taken to the operating room and general anesthesia was induced.  The patient was placed in the dorsal lithotomy position, prepped and draped in the usual sterile fashion, and preoperative antibiotics were administered. A preoperative time-out was performed.   A 21 French scope was advanced per urethra into the bladder.  Notably, the patient had a TURP-like defect within the prostatic fossa.  Attention was turned to the right ureteral orifice which  is stone was seen sitting just within the UO, with a small portion of the stent visible like the tip of nice break.  Scout imaging revealed the stone as well as multiple stones just proximal to this layering within the distal ureter.  A first attempted to place a wire in the inferior medial portion around the stone but this appeared to traverse more medial than anticipated it was somewhat worrisome for a small wire perforation at this level.  Advance a 5 [place] open-ended ureteral catheter over this wire just within the UO alongside of the stone and injected contrast which did show contrast extravasation confirming my suspicion.  I then attempted to place an angled Glidewire around the stone and a more lateral superior portion of the stone which did get around the stone and traversed up the ureter course as expected at the level of the kidney.  I advanced a 5 [place] open-ended ureteral catheter over this wire to the mid ureter and injected contrast to confirm the location within the ureter.  The ureter was massively dilated and showed severe hydronephrosis.  The Glidewire was then exchanged for a sensor wire which was snapped in place as a safety wire.  I then brought in a 273 m laser fiber and using the settings of 0.8 J and 10 Hz, the stone was fragmented into innumerable small pieces.  The majority of these pieces were able to be extracted using a 1.9 [place] tipless I no basket.  A small piece of the stone did large itself just below a submucosal flap.  I was eventually able to dislodge this successfully and eventually remove all fragments.  The scope was then placed up to level the mid ureter no additional  stone fragments were identified.  A final retrograde pyelogram was performed which showed no further extravasation and a dilated ureter all the way down to level of the bladder.  A 6 x 26 French double-J ureteral stent was then advanced over the safety wire cystoscopically up to level the renal pelvis.  The  wires partially drawn until focal stone within the pelvis.  The wires and fully withdrawn a full course over the bladder.  The bladder was then drained.  Patient was then clean and dry, repositioned in the supine position, reversed from anesthesia, taken to the PACU in stable condition.  Plan: Intraoperative findings were reviewed with the patient's wife.  This included the small ureteral perforation.  We will allow the stent to remain for longer than usual, about 3 weeks and then remove it in the office due to this.  All questions were answered.  No additional scripts were provided today as he already has liquid narcotic at home as well as Flomax.  All questions answered.  Hollice Espy, M.D.

## 2017-09-13 NOTE — Anesthesia Post-op Follow-up Note (Deleted)
Anesthesia QCDR form completed.        

## 2017-09-13 NOTE — Interval H&P Note (Signed)
History and Physical Interval Note:  09/13/2017 1:28 PM  Walter [name]  has presented today for surgery, with the diagnosis of right ureteral stone  The various methods of treatment have been discussed with the patient and family. After consideration of risks, benefits and other options for treatment, the patient has consented to  Procedure(s): CYSTOSCOPY/URETEROSCOPY/HOLMIUM LASER/STENT PLACEMENT (Right) as a surgical intervention .  The patient's history has been reviewed, patient examined, no change in status, stable for surgery.  I have reviewed the patient's chart and labs.  Questions were answered to the patient's satisfaction.    RRR CTAB  Risks and benefits of ureteroscopy were discussed in detail with the patient in the preoperative holding area.  All questions were answered today.   Hollice Espy

## 2017-09-13 NOTE — Discharge Instructions (Signed)
You have a ureteral stent in place.  This is a tube that extends from your kidney to your bladder.  This may cause urinary bleeding, burning with urination, and urinary frequency.  Please call our office or present to the ED if you develop fevers >101 or pain which is not able to be controlled with oral pain medications.  You may be given either Flomax and/ or ditropan to help with bladder spasms and stent pain in addition to pain medications.    You may resume your blood thinners tomorrow AM.  [facility] [address], [place], [place] [address] [phone]

## 2017-09-13 NOTE — Anesthesia Post-op Follow-up Note (Signed)
Anesthesia QCDR form completed.        

## 2017-09-13 NOTE — Progress Notes (Signed)
Called report to Jannifer Franklin [facility], 3rd floor nurses station at [phone].  Becky verbalized understanding of instructions with no further questions at this time.  Carron Curie, RN

## 2017-09-13 NOTE — Transfer of Care (Signed)
Immediate Anesthesia Transfer of Care Note  Patient: Walter [name]  Procedure(s) Performed: CYSTOSCOPY/URETEROSCOPY/HOLMIUM LASER/STENT PLACEMENT (Right Ureter)  Patient Location: PACU  Anesthesia Type:General  Level of Consciousness: sedated  Airway & Oxygen Therapy: Patient Spontanous Breathing and Patient connected to face mask oxygen  Post-op Assessment: Report given to RN and Post -op Vital signs reviewed and stable  Post vital signs: Reviewed and stable  Last Vitals:  Vitals Value Taken Time  BP 132/63 09/13/2017  3:06 PM  Temp 37 C 09/13/2017  3:05 PM  Pulse 63 09/13/2017  3:10 PM  Resp 11 09/13/2017  3:10 PM  SpO2 97 % 09/13/2017  3:10 PM  Vitals shown include unvalidated device data.  Last Pain:  Vitals:   09/13/17 1325  TempSrc: Oral  PainSc: 0-No pain         Complications: No apparent anesthesia complications

## 2017-09-13 NOTE — Anesthesia Preprocedure Evaluation (Signed)
Anesthesia Evaluation  Patient identified by MRN, date of birth, ID band Patient awake    Reviewed: Allergy & Precautions, H&P , NPO status , Patient's Chart, lab work & pertinent test results  History of Anesthesia Complications Negative for: history of anesthetic complications  Airway Mallampati: III  TM Distance: <3 FB Neck ROM: limited    Dental  (+) Chipped, Poor Dentition, Missing   Pulmonary neg shortness of breath, former smoker,           Cardiovascular hypertension, (-) angina+ Peripheral Vascular Disease  (-) Past MI      Neuro/Psych negative neurological ROS  negative psych ROS   GI/Hepatic negative GI ROS, Neg liver ROS,   Endo/Other  Hypothyroidism   Renal/GU Renal disease     Musculoskeletal  (+) Arthritis ,   Abdominal   Peds  Hematology negative hematology ROS (+)   Anesthesia Other Findings Past Medical History: 2011: ARF (acute renal failure) ([place])     Comment:  after TKR No date: BPH (benign prostatic hypertrophy) 1998: DVT (deep venous thrombosis) ([place])     Comment:  after left knee arthroscopy 2000: Hx of colonoscopy No date: Hypertension 2007: Hypothyroid     Comment:  postop from thyroidectomy (cancer scare) No date: Kidney stones     Comment:  recurrent 2004: Oral cancer ([place])     Comment:  graft from left thigh No date: Osteoarthritis of both knees No date: Parkinson's disease ([place]) No date: Pulmonary embolism ([place]) 2012: Tendonitis     Comment:  from quinolone 2010: Toe amputation status ([place])  Past Surgical History: No date: APPENDECTOMY No date: CHOLECYSTECTOMY No date: HERNIA REPAIR 06/13/2017: INTRAMEDULLARY (IM) NAIL INTERTROCHANTERIC; Right     Comment:  Procedure: INTRAMEDULLARY (IM) NAIL INTERTROCHANTRIC;                Surgeon: Hessie Knows, MD;  Location: [facility] ORS;                Service: Orthopedics;  Laterality: Right; 2009/2010: LITHOTRIPSY     Comment:   then stent and cystoscopic removal 2014 2004: Oral cancer removed; Right 2013: REPAIR ZENKER'S DIVERTICULA 2007: THYROIDECTOMY 2010: TOE AMPUTATION; Right     Comment:  badly overlapping other toe No date: TOTAL KNEE ARTHROPLASTY; Left 2011: TRANSURETHRAL RESECTION OF PROSTATE 0086: UMBILICAL HERNIA REPAIR  BMI    Body Mass Index:  24.39 kg/m      Reproductive/Obstetrics negative OB ROS                             Anesthesia Physical Anesthesia Plan  ASA: III  Anesthesia Plan: General LMA   Post-op Pain Management:    Induction: Intravenous  PONV Risk Score and Plan: Ondansetron, Dexamethasone, Midazolam and Treatment may vary due to age or medical condition  Airway Management Planned: LMA  Additional Equipment:   Intra-op Plan:   Post-operative Plan: Extubation in OR  Informed Consent: I have reviewed the patients History and Physical, chart, labs and discussed the procedure including the risks, benefits and alternatives for the proposed anesthesia with the patient or authorized representative who has indicated his/her understanding and acceptance.   Dental Advisory Given  Plan Discussed with: Anesthesiologist, CRNA and Surgeon  Anesthesia Plan Comments: (Patient consented for risks of anesthesia including but not limited to:  - adverse reactions to medications - damage to teeth, lips or other oral mucosa - sore throat or hoarseness - Damage to heart, brain,  lungs or loss of life  Patient voiced understanding.)        Anesthesia Quick Evaluation

## 2017-09-13 NOTE — OR Nursing (Signed)
Patient is not able to take pills whole, has to crush in applesauce or be in liquid form. Per anesthesia ok to skip pepcid.

## 2017-09-14 ENCOUNTER — Encounter: Payer: Self-pay | Admitting: Urology

## 2017-09-14 NOTE — Anesthesia Postprocedure Evaluation (Signed)
Anesthesia Post Note  Patient: Crewe Heathman  Procedure(s) Performed: CYSTOSCOPY/URETEROSCOPY/HOLMIUM LASER/STENT PLACEMENT (Right Ureter)  Patient location during evaluation: PACU Anesthesia Type: General Level of consciousness: awake and alert Pain management: pain level controlled Vital Signs Assessment: post-procedure vital signs reviewed and stable Respiratory status: spontaneous breathing, nonlabored ventilation, respiratory function stable and patient connected to nasal cannula oxygen Cardiovascular status: blood pressure returned to baseline and stable Postop Assessment: no apparent nausea or vomiting Anesthetic complications: no     Last Vitals:  Vitals:   09/13/17 1705 09/13/17 1722  BP:  128/66  Pulse: 90 86  Resp: 16 18  Temp:  37.1 C  SpO2: 92% (!) 9%    Last Pain:  Vitals:   09/13/17 1722  TempSrc: Temporal  PainSc:                  Precious Haws Latana Colin

## 2017-09-19 LAB — STONE ANALYSIS
CA OXALATE, DIHYDRATE: 30 %
CA OXALATE, MONOHYDR.: 55 %
CA PHOS CRY STONE QL IR: 15 %
Stone Weight KSTONE: 108.4 mg

## 2017-09-25 ENCOUNTER — Encounter: Payer: Self-pay | Admitting: Internal Medicine

## 2017-09-25 ENCOUNTER — Ambulatory Visit (INDEPENDENT_AMBULATORY_CARE_PROVIDER_SITE_OTHER): Payer: Medicare HMO | Admitting: Internal Medicine

## 2017-09-25 VITALS — BP 110/70 | HR 80 | Temp 97.8°F | Ht 70.0 in | Wt 170.0 lb

## 2017-09-25 DIAGNOSIS — N1330 Unspecified hydronephrosis: Secondary | ICD-10-CM

## 2017-09-25 DIAGNOSIS — G20 Parkinson's disease: Secondary | ICD-10-CM | POA: Diagnosis not present

## 2017-09-25 NOTE — Progress Notes (Signed)
Subjective:    Patient ID: Walter [name], male    DOB: [birth year], 84 y.o.   MRN: [id number]  HPI Here with wife for follow up after urologic procedure  Stent in and will need to stay in for another 10 days or so Some hematuria No pain No fever  Went right back on eliquis after procedure No chest pain No SOB  Has really been set back overall by stone and procedures Walking with walker---but not able to help much in house, etc Has aide 3 days per week to help with shower Wife needs to clean him after BMs  Current Outpatient Medications on File Prior to Visit  Medication Sig Dispense Refill  . acetaminophen (TYLENOL) 325 MG tablet Take 2 tablets (650 mg total) by mouth every 6 (six) hours as needed for mild pain or moderate pain (or Fever >/= 101).    Marland Kitchen amLODipine (NORVASC) 5 MG tablet Take 5 mg by mouth daily.    . carbidopa-levodopa (SINEMET IR) 25-100 MG tablet TAKE 1 TABLET THREE TIMES DAILY 270 tablet 3  . Cholecalciferol (VITAMIN D) 2000 UNITS CAPS Take 2,000 Units by mouth daily.     Marland Kitchen ELIQUIS 5 MG TABS tablet TAKE 1 TABLET TWICE DAILY 180 tablet 11  . levothyroxine (SYNTHROID, LEVOTHROID) 112 MCG tablet TAKE 1 TABLET EVERY DAY BEFORE BREAKFAST 90 tablet 3  . polyethylene glycol (MIRALAX / GLYCOLAX) packet Take 17 g by mouth daily.     . tamsulosin (FLOMAX) 0.4 MG CAPS capsule Take 1 capsule (0.4 mg total) by mouth daily. 30 capsule 0  . vitamin B-12 (CYANOCOBALAMIN) 1000 MCG tablet Take 1,000 mcg by mouth daily.     No current facility-administered medications on file prior to visit.     Allergies  Allergen Reactions  . Quinolones     tendonitis    Past Medical History:  Diagnosis Date  . ARF (acute renal failure) ([place]) 2011   after TKR  . BPH (benign prostatic hypertrophy)   . DVT (deep venous thrombosis) ([place]) 1998   after left knee arthroscopy  . Hx of colonoscopy 2000  . Hypertension   . Hypothyroid 2007   postop from thyroidectomy (cancer scare)  .  Kidney stones    recurrent  . Oral cancer ([place]) 2004   graft from left thigh  . Osteoarthritis of both knees   . Parkinson's disease ([place])   . Pulmonary embolism ([place])   . Tendonitis 2012   from quinolone  . Toe amputation status ([place]) 2010    Past Surgical History:  Procedure Laterality Date  . APPENDECTOMY    . CHOLECYSTECTOMY    . CYSTOSCOPY/URETEROSCOPY/HOLMIUM LASER/STENT PLACEMENT Right 09/13/2017   Procedure: CYSTOSCOPY/URETEROSCOPY/HOLMIUM LASER/STENT PLACEMENT;  Surgeon: Hollice Espy, MD;  Location: [facility] ORS;  Service: Urology;  Laterality: Right;  . HERNIA REPAIR    . INTRAMEDULLARY (IM) NAIL INTERTROCHANTERIC Right 06/13/2017   Procedure: INTRAMEDULLARY (IM) NAIL INTERTROCHANTRIC;  Surgeon: Hessie Knows, MD;  Location: [facility] ORS;  Service: Orthopedics;  Laterality: Right;  . LITHOTRIPSY  2009/2010   then stent and cystoscopic removal 2014  . Oral cancer removed Right 2004  . REPAIR ZENKER'S DIVERTICULA  2013  . THYROIDECTOMY  2007  . TOE AMPUTATION Right 2010   badly overlapping other toe  . TOTAL KNEE ARTHROPLASTY Left   . TRANSURETHRAL RESECTION OF PROSTATE  2011  . UMBILICAL HERNIA REPAIR  2007    Family History  Problem Relation Age of Onset  . Dementia Mother   .  Stroke Father   . Pulmonary disease Brother   . Heart disease Neg Hx   . Diabetes Neg Hx     Social History   Socioeconomic History  . Marital status: Married    Spouse name: Not on file  . Number of children: 3  . Years of education: Not on file  . Highest education level: Not on file  Occupational History  . 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 Financial resource strain: Not on file  . Food insecurity:    Worry: Not on file    Inability: Not on file  . Transportation needs:    Medical: Not on file    Non-medical: Not on file  Tobacco Use  . Smoking status: Former Smoker    Types: Cigarettes    Last  attempt to quit: 04/27/2002    Years since quitting: 15.4  . Smokeless tobacco: Never Used  Substance and Sexual Activity  . Alcohol use: Not Currently    Alcohol/week: 0.0 oz    Comment: one drink daily (wine or scotch)  . Drug use: No  . Sexual activity: Never  Lifestyle  . Physical activity:    Days per week: Not on file    Minutes per session: Not on file  . Stress: Not on file  Relationships  . Social connections:    Talks on phone: Not on file    Gets together: Not on file    Attends religious service: Not on file    Active member of club or organization: Not on file    Attends meetings of clubs or organizations: Not on file    Relationship status: Not on file  . Intimate partner violence:    Fear of current or ex partner: Not on file    Emotionally abused: Not on file    Physically abused: Not on file    Forced sexual activity: Not on file  Other Topics Concern  . Not on file  Social History Narrative   Son works for [employer]   1 daughter in [place], other in [place] living will   Wife, then son, would be health care POA   He isn't sure about DNR--will accept resuscitation for now   No tube feeds if cognitively unaware   Review of Systems  Sleeping fairly well Nocturia x 3-4 still (despite the flomax) Appetite is good No N/V Weight stable     Objective:   Physical Exam  Constitutional: He appears well-developed. No distress.  Neck: No thyromegaly present.  Cardiovascular: Normal rate, regular rhythm and normal heart sounds. Exam reveals no gallop.  No murmur heard. Respiratory: Effort normal and breath sounds normal. No respiratory distress. He has no wheezes. He has no rales.  GI: Soft. There is no tenderness.  Musculoskeletal: He exhibits no edema.  Lymphadenopathy:    He has no cervical adenopathy.  Neurological:  Masked facies and some bradykinesia           Assessment & Plan:

## 2017-09-25 NOTE — Assessment & Plan Note (Signed)
Stable status since backslide from his illness and procedure with the kidney stone On sinemet

## 2017-09-25 NOTE — Assessment & Plan Note (Signed)
Stent in place now Hopefully can be removed at urology follow up

## 2017-10-04 ENCOUNTER — Telehealth: Payer: Self-pay | Admitting: Urology

## 2017-10-04 NOTE — Telephone Encounter (Signed)
Pt has a 3 wk post op follow up this Friday.  His wife states pt has blood in his urine and wants to know if this is normal post op.  Please give pt a call.

## 2017-10-04 NOTE — Telephone Encounter (Signed)
Spoke w/ pt wife, informed her his urinary sx's such as blood are normal due to the stent.

## 2017-10-04 NOTE — Progress Notes (Signed)
Urology clinic follow up  HPI: Mr. Walter [name] is an 84 yo M s/p R URS/LL/stent placement for impacted 10mm right distal ureteral stone with Dr. Erlene Quan. Mild extravasation noted, and stent left for 3 weeks for ureteral healing. Stone analysis was Ca Oxalate.  Urine today appears infected with bacteria and nitrite +.  Bactrim BID x 7 days prescribed, culture pending.  Discussed with the patient and his wife the need to cancel stent removal today with infected urine.  Especially in the setting of his advanced age and comorbidities including Parkinson's. He is afebrile and appears well, denies fevers/chills, malaise.   Plan to follow up culture to confirm sensitivity to Bactrim, then re-schedule stent removal late next week with myself or Dr. Erlene Quan. Will also need renal ultrasound 4-6 weeks after stent removal   [name], MD

## 2017-10-04 NOTE — Telephone Encounter (Signed)
Left message for pt wife to call office

## 2017-10-06 ENCOUNTER — Encounter: Payer: Self-pay | Admitting: Urology

## 2017-10-06 ENCOUNTER — Ambulatory Visit (INDEPENDENT_AMBULATORY_CARE_PROVIDER_SITE_OTHER): Payer: Medicare HMO | Admitting: Urology

## 2017-10-06 ENCOUNTER — Ambulatory Visit: Payer: Medicare HMO | Admitting: Hematology and Oncology

## 2017-10-06 ENCOUNTER — Other Ambulatory Visit: Payer: Medicare HMO

## 2017-10-06 VITALS — BP 91/52 | HR 77 | Ht 70.0 in | Wt 170.3 lb

## 2017-10-06 DIAGNOSIS — R399 Unspecified symptoms and signs involving the genitourinary system: Secondary | ICD-10-CM

## 2017-10-06 DIAGNOSIS — N200 Calculus of kidney: Secondary | ICD-10-CM | POA: Diagnosis not present

## 2017-10-06 LAB — URINALYSIS, COMPLETE
BILIRUBIN UA: NEGATIVE
GLUCOSE, UA: NEGATIVE
Leukocytes, UA: NEGATIVE
NITRITE UA: POSITIVE — AB
Specific Gravity, UA: 1.02 (ref 1.005–1.030)
UUROB: 1 mg/dL (ref 0.2–1.0)
pH, UA: 6.5 (ref 5.0–7.5)

## 2017-10-06 LAB — MICROSCOPIC EXAMINATION
RBC, UA: >30 /hpf — ABNORMAL HIGH (ref 0–2)
WBC UA: NONE SEEN /HPF (ref 0–5)

## 2017-10-06 MED ORDER — SULFAMETHOXAZOLE-TRIMETHOPRIM 800-160 MG PO TABS: 1.0000 | ORAL_TABLET | Freq: Two times a day (BID) | ORAL | 0 refills | Status: DC

## 2017-10-06 MED ORDER — SULFAMETHOXAZOLE-TRIMETHOPRIM 800-160 MG PO TABS: 1.0000 | ORAL_TABLET | Freq: Two times a day (BID) | ORAL | 0 refills | Status: AC

## 2017-10-06 NOTE — Patient Instructions (Signed)
Start Bactrim BID for infected urine, we will follow up culture and call you with results. Plan for stent removal mid to late next week.

## 2017-10-10 ENCOUNTER — Telehealth: Payer: Self-pay | Admitting: Urology

## 2017-10-10 LAB — CULTURE, URINE COMPREHENSIVE

## 2017-10-10 NOTE — Telephone Encounter (Signed)
Pt inquiring about his urine results and his Friday cysto appt that is pending urine culture results. Please advise. Thank you.

## 2017-10-11 IMAGING — DX DG CHEST 1V PORT
1 series · 2 of 2 positions shown · non-contrast
Comparison: None in PACs

CLINICAL DATA: Near syncope. Chest pressure like pain, diaphoresis
and paleness. No known cardiopulmonary abnormality. History of
Parkinson's disease and acute renal failure.

EXAM:
PORTABLE CHEST 1 VIEW

[Series 1: chest ap · 0.14mm/px · 2 of 2 slices shown]
[im 1/2]
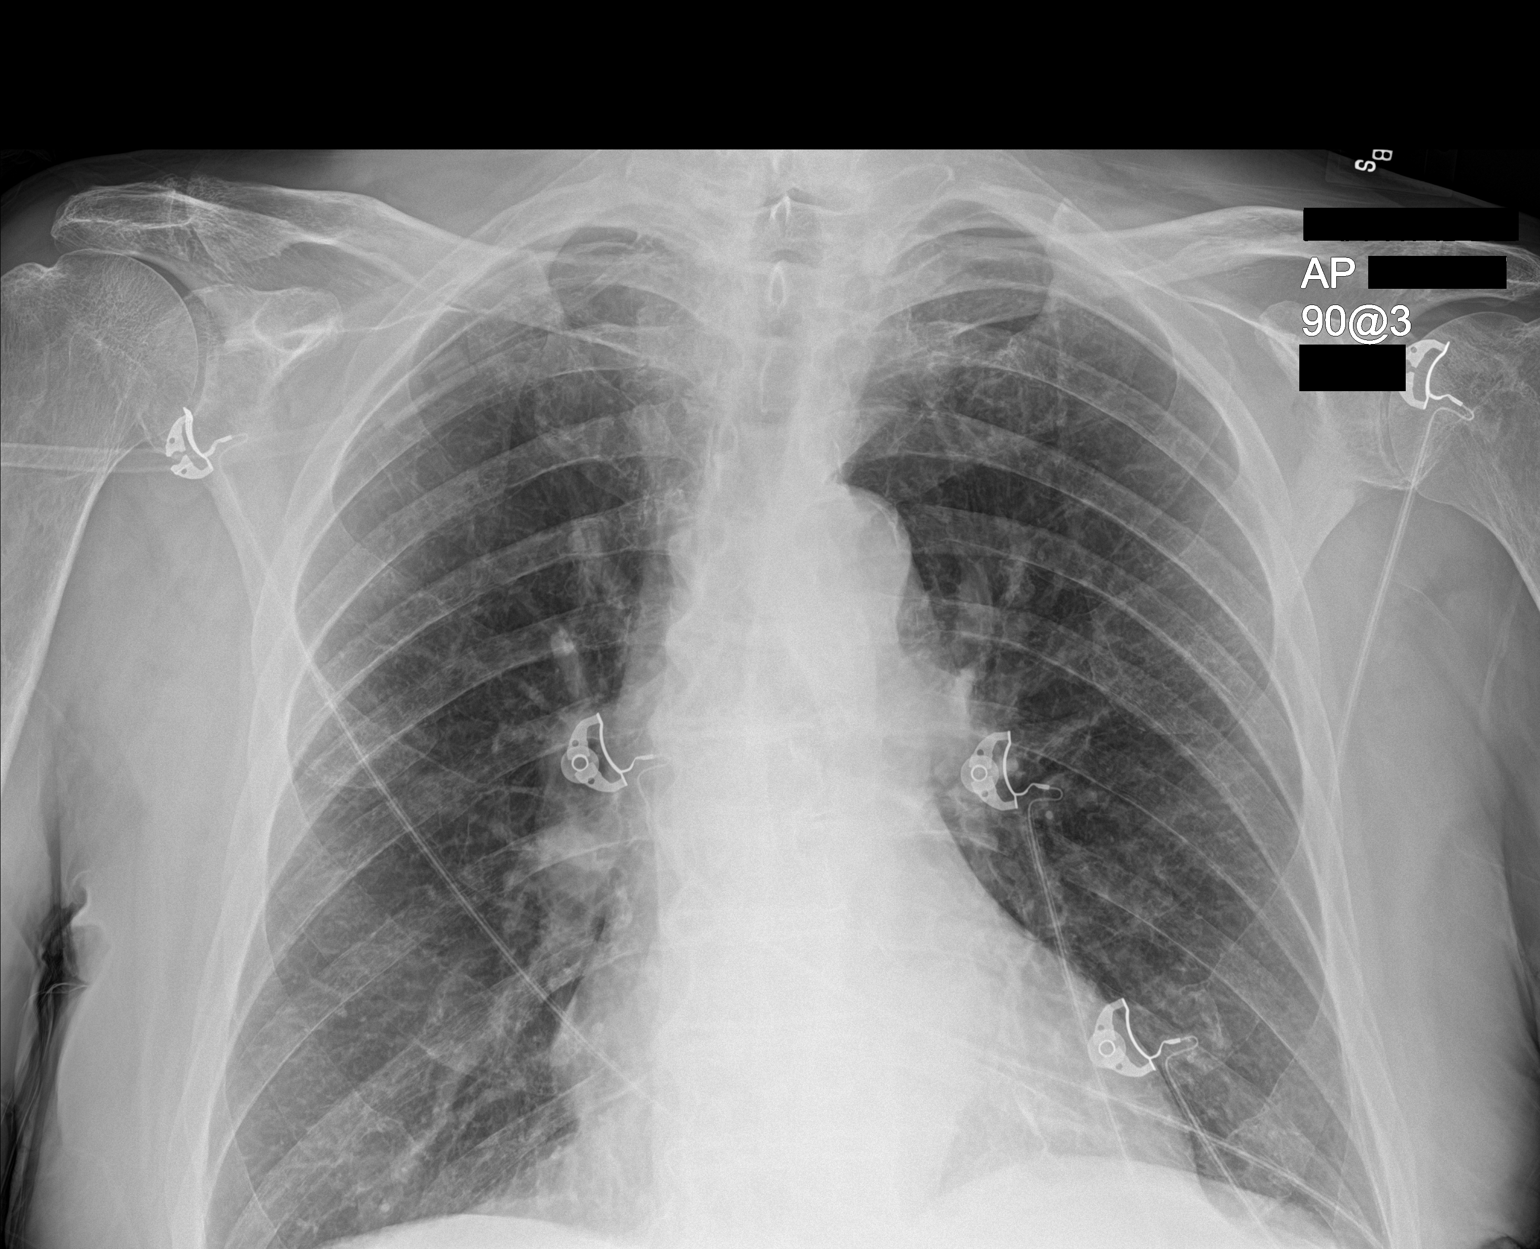
[im 2/2]
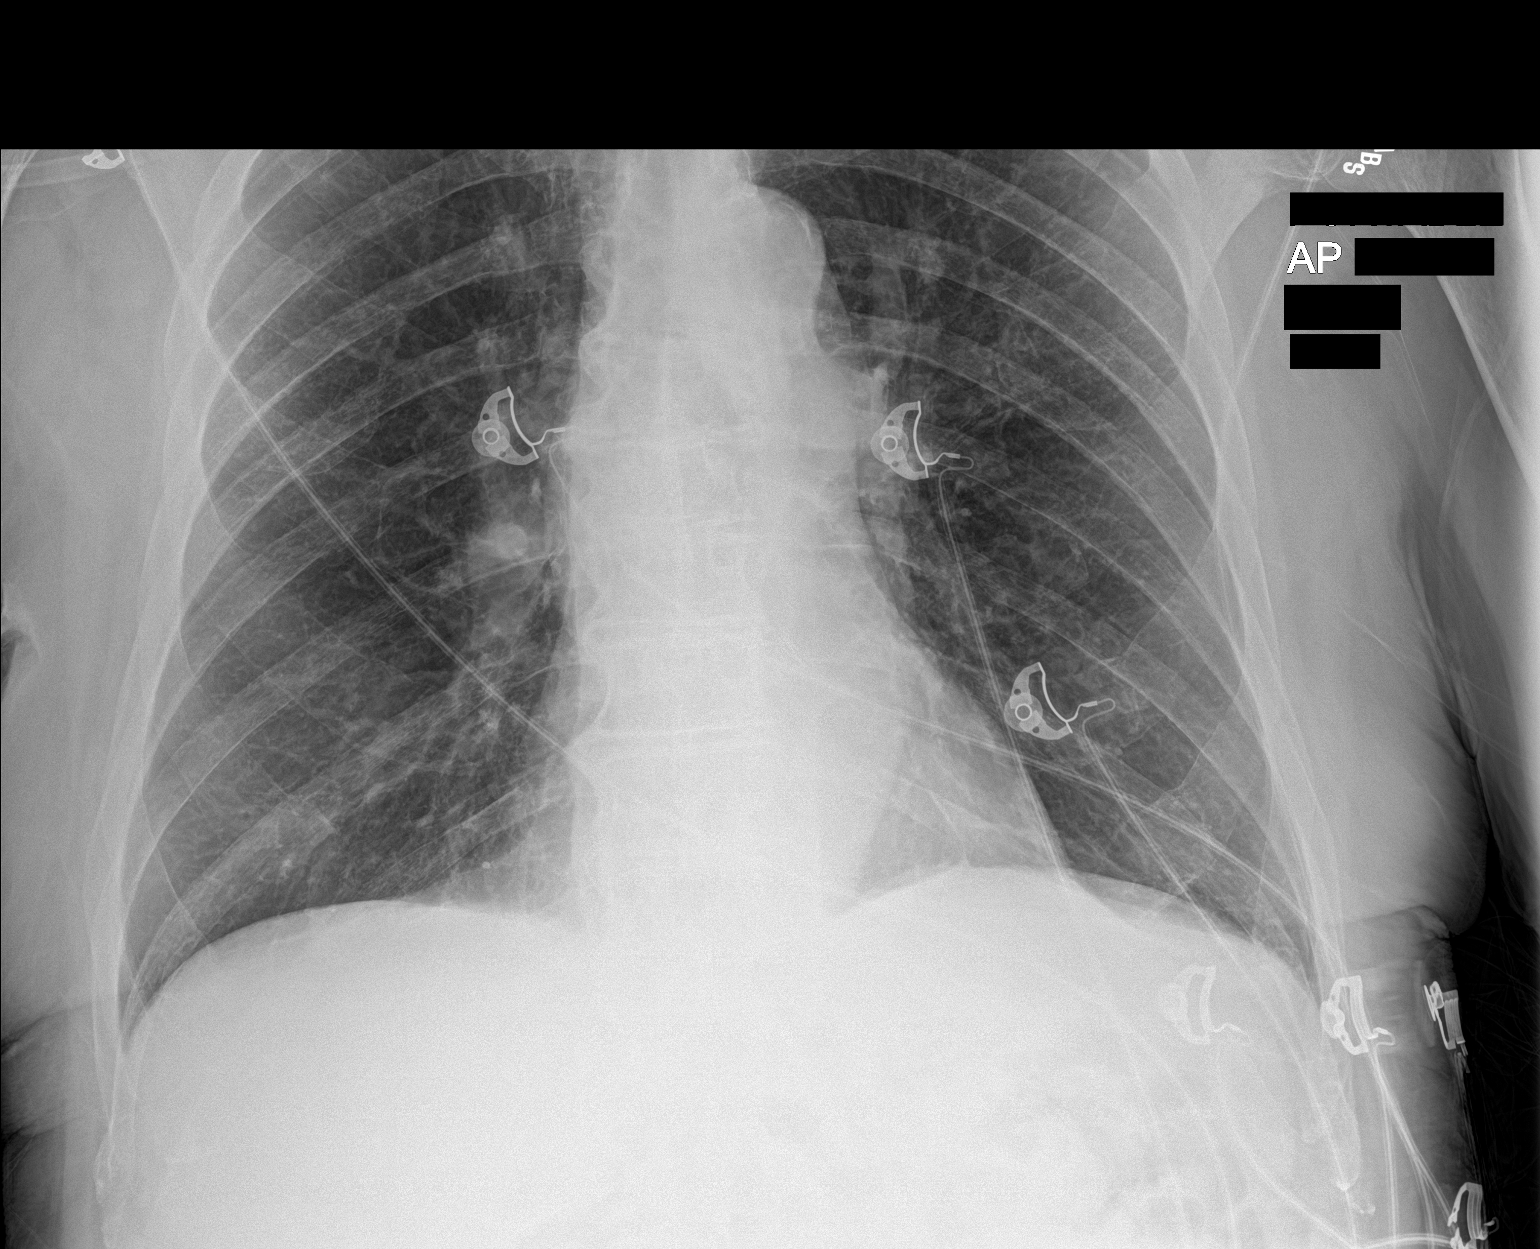

[2 of 2 positions shown; findings below may reference images not displayed]

FINDINGS: The lungs are mildly hyperinflated but clear. The heart and
pulmonary vascularity are normal. The mediastinum is normal in
width. There is calcification within the wall of the thoracic aorta.
There is no pleural effusion or pneumothorax. The observed bony
thorax exhibits no acute abnormality.
IMPRESSION: Mild hyperinflation may be voluntary or may reflect underlying
reactive airway disease or COPD. There is no pneumonia, CHF, nor
other acute cardiopulmonary abnormality.

Thoracic aortic atherosclerosis.

## 2017-10-13 ENCOUNTER — Other Ambulatory Visit: Payer: Self-pay

## 2017-10-13 ENCOUNTER — Ambulatory Visit (HOSPITAL_BASED_OUTPATIENT_CLINIC_OR_DEPARTMENT_OTHER): Payer: Medicare HMO | Admitting: Urology

## 2017-10-13 ENCOUNTER — Encounter: Payer: Self-pay | Admitting: Urology

## 2017-10-13 VITALS — BP 113/65 | HR 60 | Ht 70.0 in | Wt 170.0 lb

## 2017-10-13 DIAGNOSIS — Z87442 Personal history of urinary calculi: Secondary | ICD-10-CM

## 2017-10-13 DIAGNOSIS — N218 Other lower urinary tract calculus: Secondary | ICD-10-CM

## 2017-10-13 NOTE — Progress Notes (Signed)
Cystoscopy Procedure Note:  Indication: S/p R URS/LL/stent with Dr. Erlene Quan for impacted distal ureteral stone  Urine cx negative 10/06/2017, has been on Bactrim  After informed consent and discussion of the procedure and its risks, Dariyon Urquilla was positioned and prepped in the standard fashion. Cystoscopy was performed with the flexible cystoscope. Moderate debris in bladder obscured vision. The stent was grasped and removed entirely.  Findings: Uncomplicated stent removal  Assessment and Plan: RTC in 6 weeks with renal ultrasound, see Zara Council, PA  [place] 10/13/2017

## 2017-10-13 NOTE — Telephone Encounter (Signed)
UCX was normal, patient notified.

## 2017-10-16 ENCOUNTER — Inpatient Hospital Stay (HOSPITAL_BASED_OUTPATIENT_CLINIC_OR_DEPARTMENT_OTHER): Payer: Medicare HMO | Admitting: Hematology and Oncology

## 2017-10-16 ENCOUNTER — Other Ambulatory Visit: Payer: Self-pay | Admitting: Hematology and Oncology

## 2017-10-16 ENCOUNTER — Encounter: Payer: Self-pay | Admitting: Hematology and Oncology

## 2017-10-16 ENCOUNTER — Inpatient Hospital Stay: Payer: Medicare HMO | Attending: Hematology and Oncology

## 2017-10-16 VITALS — BP 134/72 | HR 72 | Temp 97.0°F | Resp 18 | Ht 70.0 in | Wt 170.1 lb

## 2017-10-16 DIAGNOSIS — K449 Diaphragmatic hernia without obstruction or gangrene: Secondary | ICD-10-CM | POA: Insufficient documentation

## 2017-10-16 DIAGNOSIS — E538 Deficiency of other specified B group vitamins: Secondary | ICD-10-CM

## 2017-10-16 DIAGNOSIS — E039 Hypothyroidism, unspecified: Secondary | ICD-10-CM | POA: Insufficient documentation

## 2017-10-16 DIAGNOSIS — I10 Essential (primary) hypertension: Secondary | ICD-10-CM | POA: Diagnosis not present

## 2017-10-16 DIAGNOSIS — Z7901 Long term (current) use of anticoagulants: Secondary | ICD-10-CM

## 2017-10-16 DIAGNOSIS — Z86718 Personal history of other venous thrombosis and embolism: Secondary | ICD-10-CM

## 2017-10-16 DIAGNOSIS — M1990 Unspecified osteoarthritis, unspecified site: Secondary | ICD-10-CM

## 2017-10-16 DIAGNOSIS — Z8719 Personal history of other diseases of the digestive system: Secondary | ICD-10-CM

## 2017-10-16 DIAGNOSIS — Z87891 Personal history of nicotine dependence: Secondary | ICD-10-CM | POA: Diagnosis not present

## 2017-10-16 DIAGNOSIS — G20 Parkinson's disease: Secondary | ICD-10-CM

## 2017-10-16 DIAGNOSIS — D649 Anemia, unspecified: Secondary | ICD-10-CM

## 2017-10-16 DIAGNOSIS — Z87442 Personal history of urinary calculi: Secondary | ICD-10-CM

## 2017-10-16 DIAGNOSIS — R609 Edema, unspecified: Secondary | ICD-10-CM | POA: Insufficient documentation

## 2017-10-16 DIAGNOSIS — Z79899 Other long term (current) drug therapy: Secondary | ICD-10-CM | POA: Insufficient documentation

## 2017-10-16 DIAGNOSIS — N400 Enlarged prostate without lower urinary tract symptoms: Secondary | ICD-10-CM

## 2017-10-16 DIAGNOSIS — I2692 Saddle embolus of pulmonary artery without acute cor pulmonale: Secondary | ICD-10-CM

## 2017-10-16 DIAGNOSIS — Z85818 Personal history of malignant neoplasm of other sites of lip, oral cavity, and pharynx: Secondary | ICD-10-CM

## 2017-10-16 DIAGNOSIS — N281 Cyst of kidney, acquired: Secondary | ICD-10-CM | POA: Diagnosis not present

## 2017-10-16 DIAGNOSIS — Z86711 Personal history of pulmonary embolism: Secondary | ICD-10-CM | POA: Insufficient documentation

## 2017-10-16 LAB — COMPREHENSIVE METABOLIC PANEL
ALT: <5 U/L (ref 0–44)
AST: 16 U/L (ref 15–41)
Albumin: 4.2 g/dL (ref 3.5–5.0)
Alkaline Phosphatase: 78 U/L (ref 38–126)
Anion gap: 9 (ref 5–15)
BUN: 27 mg/dL — ABNORMAL HIGH (ref 8–23)
CO2: 26 mmol/L (ref 22–32)
Calcium: 9.2 mg/dL (ref 8.9–10.3)
Chloride: 106 mmol/L (ref 98–111)
Creatinine, Ser: 1.85 mg/dL — ABNORMAL HIGH (ref 0.61–1.24)
GFR calc Af Amer: 37 mL/min — ABNORMAL LOW (ref >60)
GFR calc non Af Amer: 32 mL/min — ABNORMAL LOW (ref >60)
Glucose, Bld: 115 mg/dL — ABNORMAL HIGH (ref 70–99)
Potassium: 4.3 mmol/L (ref 3.5–5.1)
Sodium: 141 mmol/L (ref 135–145)
Total Bilirubin: 0.8 mg/dL (ref 0.3–1.2)
Total Protein: 7.1 g/dL (ref 6.5–8.1)

## 2017-10-16 LAB — CBC WITH DIFFERENTIAL/PLATELET
Basophils Absolute: 0 10*3/uL (ref 0–0.1)
Basophils Relative: 1 %
Eosinophils Absolute: 0.1 10*3/uL (ref 0–0.7)
Eosinophils Relative: 3 %
HCT: 39.5 % — ABNORMAL LOW (ref 40.0–52.0)
Hemoglobin: 13 g/dL (ref 13.0–18.0)
Lymphocytes Relative: 25 %
Lymphs Abs: 1.2 10*3/uL (ref 1.0–3.6)
MCH: 30.2 pg (ref 26.0–34.0)
MCHC: 33 g/dL (ref 32.0–36.0)
MCV: 91.5 fL (ref 80.0–100.0)
Monocytes Absolute: 0.5 10*3/uL (ref 0.2–1.0)
Monocytes Relative: 10 %
Neutro Abs: 3 10*3/uL (ref 1.4–6.5)
Neutrophils Relative %: 61 %
Platelets: 234 10*3/uL (ref 150–440)
RBC: 4.32 MIL/uL — ABNORMAL LOW (ref 4.40–5.90)
RDW: 15.1 % — ABNORMAL HIGH (ref 11.5–14.5)
WBC: 4.9 10*3/uL (ref 3.8–10.6)

## 2017-10-16 LAB — FOLATE: Folate: 13.9 ng/mL (ref >5.9)

## 2017-10-16 LAB — FERRITIN: Ferritin: 224 ng/mL (ref 24–336)

## 2017-10-16 NOTE — Progress Notes (Signed)
[facility] day:  10/16/2017  Chief Complaint: Walter [name] is a 84 y.o. male with a saddle pulmonary embolism and lower extremity DVT who is seen for 7 month assessment.  HPI:  The patient was last seen in the hematology clinic on 03/10/2017.  At that time, he felt well. Patient denied any acute physical concerns. Patient was eating well and has gained 5 pounds. Dysphagia had improved.  He was more active. He was participating in a boxing class at [facility]. Exam revealed ankle edema.  Labs were unremarkable. He continued Eliquis.  He was admitted to [facility] from 06/12/2017 - 06/16/2017 with right hip fracture s/p fall.  He underwent right intertrochanteric intramedullary fixation by Dr. Rudene Christians on 06/13/2017.  He tolerated the procedure well.   He underwent right ureteroscopy, laser lithotripsy, right ureteral stent placement, right retrograde pyelogram, and basket extraction of stone fragment on 09/13/2017 for right distal ureteral calculus with associated severe right hydroureteronephrosis.  He had blood in his urine post procedure.  During the interim, he notes that his energy is picking up.  He is "up and around".  He is getting in and out of bed.  He is up 4 or more times/night.      Past Medical History:  Diagnosis Date  . ARF (acute renal failure) ([place]) 2011   after TKR  . BPH (benign prostatic hypertrophy)   . DVT (deep venous thrombosis) ([place]) 1998   after left knee arthroscopy  . Hx of colonoscopy 2000  . Hypertension   . Hypothyroid 2007   postop from thyroidectomy (cancer scare)  . Kidney stones    recurrent  . Oral cancer ([place]) 2004   graft from left thigh  . Osteoarthritis of both knees   . Parkinson's disease ([place])   . Pulmonary embolism ([place])   . Tendonitis 2012   from quinolone  . Toe amputation status ([place]) 2010    Past Surgical History:  Procedure Laterality Date  . APPENDECTOMY    . CHOLECYSTECTOMY    .  CYSTOSCOPY/URETEROSCOPY/HOLMIUM LASER/STENT PLACEMENT Right 09/13/2017   Procedure: CYSTOSCOPY/URETEROSCOPY/HOLMIUM LASER/STENT PLACEMENT;  Surgeon: Hollice Espy, MD;  Location: [facility] ORS;  Service: Urology;  Laterality: Right;  . HERNIA REPAIR    . INTRAMEDULLARY (IM) NAIL INTERTROCHANTERIC Right 06/13/2017   Procedure: INTRAMEDULLARY (IM) NAIL INTERTROCHANTRIC;  Surgeon: Hessie Knows, MD;  Location: [facility] ORS;  Service: Orthopedics;  Laterality: Right;  . LITHOTRIPSY  2009/2010   then stent and cystoscopic removal 2014  . Oral cancer removed Right 2004  . REPAIR ZENKER'S DIVERTICULA  2013  . THYROIDECTOMY  2007  . TOE AMPUTATION Right 2010   badly overlapping other toe  . TOTAL KNEE ARTHROPLASTY Left   . TRANSURETHRAL RESECTION OF PROSTATE  2011  . UMBILICAL HERNIA REPAIR  2007    Family History  Problem Relation Age of Onset  . Dementia Mother   . Stroke Father   . Pulmonary disease Brother   . Heart disease Neg Hx   . Diabetes Neg Hx     Social History:  reports that he quit smoking about 15 years ago. His smoking use included cigarettes. He has never used smokeless tobacco. He reports that he drank alcohol. He reports that he does not use drugs.  He previously smoked 2-3 cigars/day for 30-40 years.  He stopped smoking in 2004.  He previously lived in [place].  He lives in [place].  They moved to [facility].  He and  his wife met in junior high school.  They have been married 77 years.  The patient is accompanied by his wife, Walter [name], today.  Allergies:  Allergies  Allergen Reactions  . Quinolones     tendonitis    Current Medications: Current Outpatient Medications  Medication Sig Dispense Refill  . Cholecalciferol (VITAMIN D) 2000 UNITS CAPS Take 2,000 Units by mouth daily.     Marland Kitchen ELIQUIS 5 MG TABS tablet TAKE 1 TABLET TWICE DAILY 180 tablet 11  . vitamin B-12 (CYANOCOBALAMIN) 1000 MCG tablet Take 1,000 mcg by mouth daily.    Marland Kitchen acetaminophen (TYLENOL) 325 MG  tablet Take 2 tablets (650 mg total) by mouth every 6 (six) hours as needed for mild pain or moderate pain (or Fever >/= 101).    Marland Kitchen amLODipine (NORVASC) 5 MG tablet TAKE 1 TABLET EVERY DAY 90 tablet 3  . carbidopa-levodopa (SINEMET IR) 25-100 MG tablet TAKE 1 TABLET THREE TIMES DAILY 270 tablet 3  . levothyroxine (SYNTHROID, LEVOTHROID) 112 MCG tablet TAKE 1 TABLET EVERY DAY BEFORE BREAKFAST 90 tablet 3  . polyethylene glycol (MIRALAX / GLYCOLAX) packet Take 17 g by mouth daily.      No current facility-administered medications for this visit.     Review of Systems:  GENERAL:  Energy level picking up..  No fevers, sweats.  Weight down 12 pounds. PERFORMANCE STATUS (ECOG):  2 HEENT:  No visual changes, runny nose, sore throat, mouth sores or tenderness. Lungs: No shortness of breath or cough.  No hemoptysis. Cardiac:  No chest pain, palpitations, orthopnea, or PND. GI:  Good appetitie.  Chews small bites.  No nausea, vomiting, diarrhea, constipation, melena or hematochezia. GU:  No urgency, frequency, dysuria.  Hematuria, resolved.  Nocturia. Musculoskeletal:  No back pain.  Interval hip fracture.  Right hip aches.  No muscle tenderness. Extremities:  No pain or swelling. Skin:  No rashes or skin changes. Neuro:  Parkinson's.  No headache, numbness or weakness, balance or coordination issues. Endocrine:  No diabetes.  Thyroid disease on Synthroid.  No hot flashes or night sweats. Psych:  No mood changes, depression or anxiety. Pain:  No focal pain. Review of systems:  All other systems reviewed and found to be negative.   Physical Exam: Blood pressure 134/72, pulse 72, temperature (!) 97 F (36.1 C), temperature source Tympanic, resp. rate 18, height '5\' 10"'$  (1.778 m), weight 170 lb 1.6 oz (77.2 kg), SpO2 97 %. GENERAL:  Well developed, well nourished, elderly gentleman sitting comfortably in the exam room in no acute distress.  He has a rolling walker at his side. MENTAL STATUS:  Alert  and oriented to person, place and time. HEAD:  Pearline Cables hair.  Normocephalic, atraumatic, face symmetric, no Cushingoid features. EYES:  Blue eyes.  Pupils equal round and reactive to light and accomodation.  No conjunctivitis or scleral icterus. ENT:  Oropharynx clear without lesion.  Tongue normal. Mucous membranes moist.  RESPIRATORY:  Clear to auscultation without rales, wheezes or rhonchi. CARDIOVASCULAR:  Regular rate and rhythm without murmur, rub or gallop. ABDOMEN:  Soft, non-tender, with active bowel sounds, and no hepatosplenomegaly.  No masses. SKIN:  Right arm bandage.  No rashes, ulcers or lesions. EXTREMITIES: Chronic bilateral lower extremity edema. No skin discoloration or tenderness.  No palpable cords. LYMPH NODES: No palpable cervical, supraclavicular, axillary or inguinal adenopathy  NEUROLOGICAL: Unremarkable. PSYCH:  Appropriate.    Appointment on 10/16/2017  Component Date Value Ref Range Status  . Vitamin B-12 10/16/2017 602  180 - 914 pg/mL Final   Comment: (NOTE) This assay is not validated for testing neonatal or myeloproliferative syndrome specimens for Vitamin B12 levels. Performed at [facility], [address], [place], [place] [address]   . Folate 10/16/2017 13.9  >5.9 ng/mL Final   Performed at [facility], [address] [place], [place] [address]  . Ferritin 10/16/2017 224  24 - 336 ng/mL Final   Performed at [facility], [address] [place], [place] [address]  . WBC 10/16/2017 4.9  3.8 - 10.6 K/uL Final  . RBC 10/16/2017 4.32* 4.40 - 5.90 MIL/uL Final  . Hemoglobin 10/16/2017 13.0  13.0 - 18.0 g/dL Final  . HCT 10/16/2017 39.5* 40.0 - 52.0 % Final  . MCV 10/16/2017 91.5  80.0 - 100.0 fL Final  . MCH 10/16/2017 30.2  26.0 - 34.0 pg Final  . MCHC 10/16/2017 33.0  32.0 - 36.0 g/dL Final  . RDW 10/16/2017 15.1* 11.5 - 14.5 % Final  . Platelets 10/16/2017 234  150 - 440 K/uL Final  . Neutrophils Relative % 10/16/2017 61   % Final  . Neutro Abs 10/16/2017 3.0  1.4 - 6.5 K/uL Final  . Lymphocytes Relative 10/16/2017 25  % Final  . Lymphs Abs 10/16/2017 1.2  1.0 - 3.6 K/uL Final  . Monocytes Relative 10/16/2017 10  % Final  . Monocytes Absolute 10/16/2017 0.5  0.2 - 1.0 K/uL Final  . Eosinophils Relative 10/16/2017 3  % Final  . Eosinophils Absolute 10/16/2017 0.1  0 - 0.7 K/uL Final  . Basophils Relative 10/16/2017 1  % Final  . Basophils Absolute 10/16/2017 0.0  0 - 0.1 K/uL Final   Performed at [facility], [address], [place], [place] [address]  . Sodium 10/16/2017 141  135 - 145 mmol/L Final  . Potassium 10/16/2017 4.3  3.5 - 5.1 mmol/L Final  . Chloride 10/16/2017 106  98 - 111 mmol/L Final  . CO2 10/16/2017 26  22 - 32 mmol/L Final  . Glucose, Bld 10/16/2017 115* 70 - 99 mg/dL Final  . BUN 10/16/2017 27* 8 - 23 mg/dL Final  . Creatinine, Ser 10/16/2017 1.85* 0.61 - 1.24 mg/dL Final  . Calcium 10/16/2017 9.2  8.9 - 10.3 mg/dL Final  . Total Protein 10/16/2017 7.1  6.5 - 8.1 g/dL Final  . Albumin 10/16/2017 4.2  3.5 - 5.0 g/dL Final  . AST 10/16/2017 16  15 - 41 U/L Final  . ALT 10/16/2017 <5  0 - 44 U/L Final  . Alkaline Phosphatase 10/16/2017 78  38 - 126 U/L Final  . Total Bilirubin 10/16/2017 0.8  0.3 - 1.2 mg/dL Final  . GFR calc non Af Amer 10/16/2017 32* >60 mL/min Final  . GFR calc Af Amer 10/16/2017 37* >60 mL/min Final   Comment: (NOTE) The eGFR has been calculated using the CKD EPI equation. This calculation has not been validated in all clinical situations. eGFR's persistently <60 mL/min signify possible Chronic Kidney Disease.   Georgiann Hahn gap 10/16/2017 9  5 - 15 Final   Performed at [facility], [address] [place], [place] [address]    Assessment:  Ayaan Shutes is a 84 y.o. male with a history of recurrent thrombosis.  He had a left lower extremity DVT in 1990 following arthroscopic surgery.  He was on Coumadin < 1 year.  He developed a pulmonary embolism on  03/03/2016 without apparent precipitating events.  Three weeks prior, he had  been on a 2 1/2 hour flight.  He is on Eliquis.  Chest CT angiogram on 03/03/2016 revealed a moderate to large burden of pulmonary emboli in all lobes including a saddle embolus with no evidence of heart strain.  There was a small hiatal hernia with possible associated wall thickening in the distal esophagus.  Bilateral lower extremity duplex on 03/04/2016 revealed an age-indeterminate short-segment occlusive DVT within distal aspect of the right femoral vein.  Hypercoagulable work-up on 03/04/2016 included the following normal studies: CBC, factor V Leiden, prothrombin gene mutation, lupus anticoagulant panel, anti-cardiolipin antibodies, beta-2 glycoprotein, protein S total (115%), protein S activity (110%), protein C activity (81%), AT III activity (84%).  Protein C total was 59% (60-150%).  Homocysteine level was 20.5 (0-15).  Creatinine was 1.63 (CrCl 43 ml/min).  Bilirubin was 1.5 (repeat 0.8 on 03/21/2016).  He is due for a colonoscopy.  He has a history of Zenker's diverticulum.   He has a history of nephrolithiasis.  CT renal stone study on 03/21/2016 revealed numerous small bilateral nonobstructing renal stones without hydronephrosis.  There was a large 7.6 cm right renal cyst which could not be fully characterized without intravenous contrast.  CT urogram and cystoscopy are planned.  He underwent right ureteroscopy, laser lithotripsy, right ureteral stent placement, right retrograde pyelogram, and basket extraction of stone fragment on 09/13/2017 for right distal ureteral calculus with associated severe right hydroureteronephrosis.  He underwent right intertrochanteric intramedullary fixation for a right hip fracture on 06/13/2017.   He has a history of oral cancer s/p resection of his inner cheek at the [facility] in 2004.   Lab on 03/21/2016 included a normal CEA (4.2), CA19.9 (22), and PSA  (3.56).  Symptomatically, he is slowly recovering from his right hip fracture.  Plan: 1.  Labs today:  CBC with diff, CMP, ferritin, B12, folate. 2.  Recurrent thrombosis:  Continue Eliquis. 3.  RTC in 9 months for MD assessment and labs (CBC with diff, CMP).   Lequita Asal, MD  10/16/2017, 5:09 PM

## 2017-10-17 ENCOUNTER — Other Ambulatory Visit: Payer: Self-pay | Admitting: Internal Medicine

## 2017-10-17 LAB — VITAMIN B12: Vitamin B-12: 602 pg/mL (ref 180–914)

## 2017-10-23 NOTE — Progress Notes (Signed)
Walter [name] was seen today in the movement disorders clinic for neurologic consultation at the request of Venia Carbon, MD.  The consultation is for the evaluation of Parkinson's disease.  The patient reports he has never seen a neurologist previously.  This patient is accompanied in the office by his spouse who supplements the history.  I have had the opportunity to review records from his primary care physician regarding the diagnosis.  The patient was diagnosed with Parkinson's disease by his primary care physician in June, 2016 and placed on levodopa.  The patient indicates that after starting the medication, he felt that his symptoms were much better.  His balance was better.  Reports that [place] helped his balance as well.  Pt reports that his first sx was slowness, shuffling and balance trouble.    04/01/16 update:  Pt f/u today, accompanied by his wife who supplements the hx.  On carbidopa/levodopa 25/100 tid.  Pt denies falls.  Pt denies lightheadedness, near syncope.  No hallucinations.  Mood has been good.  The records that were made available to me were reviewed.  He had a MBE on 12/11/15 and there was moderate oropharyngeal dysphagia.  There was laryngeal penetration of nectar thick liquid.  Small zenkers diverticulum noted.  Regular diet recommended.  Asks about going to ST and that is his biggest c/o.  Speech getting quiet.  Used to enjoy singing and now can't and would like to get back to that.  MRI brain done and reviewed on 12/03/15.  Demonstrated only mod atrophy and mod small vessel disease.  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  He was d/c on 03/05/16 on eliquis.  Back on the upswing but not been able to exercise yet.    08/18/16 update:  Patient seen today in follow-up.  He is accompanied by his wife who supplements the history.  Patient remains on carbidopa/levodopa 25/100, one tablet 3 times per day.  He did complete LSVT big and loud since  our last visit.  He states that no falls.  He is not exercising.  States that is because his wife is getting ready to have back surgery and decided not to enter a program until she has gotten well.  He is still taking his B12 liquid supplement.  12/20/16 update: Patient is seen today in follow-up for his parkinsonism.  He is accompanied by his wife who supplements the history.  The patient is supposed to be on carbidopa/levodopa 25/100, 1 tablet 3 times per day but admits he often misses the middle of the day dosage.  Wife notes more shuffling.  Records have been reviewed since our last visit.  He has had his yearly physical since last visit.  He has had some falls.  States that he was feeling "pretty good" and he was gardening and he tripped himself on roots and he had to have some help by a neighbor getting off the ground.  He has also fallen out of bed 2 times with active dreams.  He actually put a hold in the wall with his shoulder/elbow when falling out of the bed. No lightheadedness or near syncope.  No hallucinations.  No visual distortions.  04/28/17 update: Patient is seen today in follow-up for parkinsonism.  He is accompanied by his wife who supplements the history.  Talked to the patient last visit about making sure he takes the middle of the day dose of his levodopa.  He is on carbidopa/levodopa 25/100, 1 tablet 3 times per day.  No falls.  No lightheadedness.  No hallucinations.  He is doing rock steady boxing.  We added low-dose clonazepam last visit for REM behavior disorder.  He reports that he d/c it due to fact that it made him feel like a zombie and he had a hang over effect.  He tried it for 3 nights.  It did help the dreams.  He d/c it but has had no violent dreams.   records were reviewed since last visit.  He saw hematology/oncology on March 10, 2017.  He remains on Eliquis.  He has some swallow trouble due to zenkers and he has to be careful.  10/24/17 update: Patient seen today in  follow-up for Parkinson's.  He is accompanied by his wife who supplements history.  He is on carbidopa/levodopa 25/100, 1 tablet 3 times per day.   He fx his R hip at home.  Got out of the car and fell on the cement.  He had surgery at [facility].  He went to rehab at [facility].  Not long thereafter, he was admitted on July 17 because of right distal ureteral calculus and severe right hydronephrosis.  He had laser lithotripsy and stent placement.  Stent has since been removed.   In regards REM behavior disorder, the patient states that he had one more violent dream and he hit the bedrail and hurt his arm.  records are reviewed since last visit.    PREVIOUS MEDICATIONS: Sinemet; klonopin - 0.25 mg with hang over effect  ALLERGIES:   Allergies  Allergen Reactions  . Quinolones     tendonitis    CURRENT MEDICATIONS:  Outpatient Encounter Medications as of 10/24/2017  Medication Sig  . acetaminophen (TYLENOL) 325 MG tablet Take 2 tablets (650 mg total) by mouth every 6 (six) hours as needed for mild pain or moderate pain (or Fever >/= 101).  . amLODipine (NORVASC) 5 MG tablet TAKE 1 TABLET EVERY DAY  . carbidopa-levodopa (SINEMET IR) 25-100 MG tablet TAKE 1 TABLET THREE TIMES DAILY  . Cholecalciferol (VITAMIN D) 2000 UNITS CAPS Take 2,000 Units by mouth daily.   . ELIQUIS 5 MG TABS tablet TAKE 1 TABLET TWICE DAILY  . levothyroxine (SYNTHROID, LEVOTHROID) 112 MCG tablet TAKE 1 TABLET EVERY DAY BEFORE BREAKFAST  . polyethylene glycol (MIRALAX / GLYCOLAX) packet Take 17 g by mouth daily.   . vitamin B-12 (CYANOCOBALAMIN) 1000 MCG tablet Take 1,000 mcg by mouth daily.  . [DISCONTINUED] clonazePAM (KLONOPIN) 0.5 MG tablet Take by mouth.  . [DISCONTINUED] tamsulosin (FLOMAX) 0.4 MG CAPS capsule Take 1 capsule (0.4 mg total) by mouth daily. (Patient not taking: Reported on 10/16/2017)   No facility-administered encounter medications on file as of 10/24/2017.     PAST MEDICAL HISTORY:   Past Medical  History:  Diagnosis Date  . ARF (acute renal failure) (HCC) 2011   after TKR  . BPH (benign prostatic hypertrophy)   . DVT (deep venous thrombosis) (HCC) 1998   after left knee arthroscopy  . Hx of colonoscopy 2000  . Hypertension   . Hypothyroid 2007   postop from thyroidectomy (cancer scare)  . Kidney stones    recurrent  . Oral cancer (HCC) 2004   graft from left thigh  . Osteoarthritis of both knees   . Parkinson's disease (HCC)   . Pulmonary embolism (HCC)   . Tendonitis 2012   from quinolone  . Toe amputation status (HCC) 2010      PAST SURGICAL HISTORY:   Past Surgical History:  Procedure Laterality Date  . APPENDECTOMY    . CHOLECYSTECTOMY    . CYSTOSCOPY/URETEROSCOPY/HOLMIUM LASER/STENT PLACEMENT Right 09/13/2017   Procedure: CYSTOSCOPY/URETEROSCOPY/HOLMIUM LASER/STENT PLACEMENT;  Surgeon: Brandon, Ashley, MD;  Location: [facility] ORS;  Service: Urology;  Laterality: Right;  . HERNIA REPAIR    . INTRAMEDULLARY (IM) NAIL INTERTROCHANTERIC Right 06/13/2017   Procedure: INTRAMEDULLARY (IM) NAIL INTERTROCHANTRIC;  Surgeon: Menz, Michael, MD;  Location: [facility] ORS;  Service: Orthopedics;  Laterality: Right;  . LITHOTRIPSY  2009/2010   then stent and cystoscopic removal 2014  . Oral cancer removed Right 2004  . REPAIR ZENKER'S DIVERTICULA  2013  . THYROIDECTOMY  2007  . TOE AMPUTATION Right 2010   badly overlapping other toe  . TOTAL KNEE ARTHROPLASTY Left   . TRANSURETHRAL RESECTION OF PROSTATE  2011  . UMBILICAL HERNIA REPAIR  2007    SOCIAL HISTORY:   Social History   Socioeconomic History  . Marital status: Married    Spouse name: Not on file  . Number of children: 3  . Years of education: Not on file  . Highest education level: Not on file  Occupational History  . 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 Financial resource strain: Not on file  . Food insecurity:    Worry: Not on file     Inability: Not on file  . Transportation needs:    Medical: Not on file    Non-medical: Not on file  Tobacco Use  . Smoking status: Former Smoker    Types: Cigarettes    Last attempt to quit: 04/27/2002    Years since quitting: 15.5  . Smokeless tobacco: Never Used  Substance and Sexual Activity  . Alcohol use: Not Currently    Alcohol/week: 0.0 standard drinks    Comment: one drink daily (wine or scotch)  . Drug use: No  . Sexual activity: Not Currently  Lifestyle  . Physical activity:    Days per week: Not on file    Minutes per session: Not on file  . Stress: Not on file  Relationships  . Social connections:    Talks on phone: Not on file    Gets together: Not on file    Attends religious service: Not on file    Active member of club or organization: Not on file    Attends meetings of clubs or organizations: Not on file    Relationship status: Not on file  . Intimate partner violence:    Fear of current or ex partner: Not on file    Emotionally abused: Not on file    Physically abused: Not on file    Forced sexual activity: Not on file  Other Topics Concern  . Not on file  Social History Narrative   Son works for [employer]   1 daughter in [place], other in [place]      Has living will   Wife, then son, would be health care POA   He isn't sure about DNR--will accept resuscitation for now   No tube feeds if cognitively unaware    FAMILY HISTORY:   Family Status  Relation Name Status  . Mother  Deceased at age 90       old age  . Father  Deceased at age 52       stroke  . Brother  Deceased       unknown-- had moved away  .   Child 3,healthy Alive  . Neg Hx  (Not Specified)    ROS:  A complete 10 system review of systems was obtained and was unremarkable apart from what is mentioned above.  PHYSICAL EXAMINATION:    VITALS:   Vitals:   10/24/17 1351  BP: 108/62  Pulse: 72  SpO2: 98%  Weight: 172 lb (78 kg)  Height: 5' 10" (1.778 m)    GEN:  The  patient appears stated age and is in NAD. HEENT:  Normocephalic, atraumatic.  The mucous membranes are moist. The superficial temporal arteries are without ropiness or tenderness. CV:  RRR Lungs:  CTAB Neck/HEME:  There are no carotid bruits bilaterally.  Neurological examination:  Orientation: The patient is alert and oriented x3. Cranial nerves: There is good facial symmetry with facial hypomimia.  The visual fields are full to confrontational testing. The speech is fluent and clear.  He is hypophonic.  Soft palate rises symmetrically and there is no tongue deviation. Hearing is intact to conversational tone. Sensation: Sensation is intact to light touch throughout. Motor: Strength is at least antigravity x 4.   Movement examination: Tone: There is normal tone in the UE/LE today Abnormal movements: There is no tremor today Coordination:  There is no decremation, with any form of RAMS, including alternating supination and pronation of the forearm, hand opening and closing, finger taps, heel taps and toe taps. Gait and Station: The patient has mild difficulty arising out of a deep-seated chair (able to do it on 2nd trial).  He walks very well with his walker.  Lab Results  Component Value Date   [id number] 098 10/16/2017     Chemistry      Component Value Date/Time   NA 141 10/16/2017 1454   K 4.3 10/16/2017 1454   CL 106 10/16/2017 1454   CO2 26 10/16/2017 1454   BUN 27 (H) 10/16/2017 1454   CREATININE 1.85 (H) 10/16/2017 1454      Component Value Date/Time   CALCIUM 9.2 10/16/2017 1454   ALKPHOS 78 10/16/2017 1454   AST 16 10/16/2017 1454   ALT <5 10/16/2017 1454   BILITOT 0.8 10/16/2017 1454     Lab Results  Component Value Date   WBC 4.9 10/16/2017   HGB 13.0 10/16/2017   HCT 39.5 (L) 10/16/2017   MCV 91.5 10/16/2017   PLT 234 10/16/2017     ASSESSMENT/PLAN:  1.  Idiopathic Parkinson's disease  -We discussed the diagnosis as well as pathophysiology of the  disease.  We discussed treatment options as well as prognostic indicators.  Patient education was provided.  -continue carbidopa/levodopa 25/100 tid.  Risks, benefits, side effects and alternative therapies were discussed.  The opportunity to ask questions was given and they were answered to the best of my ability.  The patient expressed understanding and willingness to follow the outlined treatment protocols.  -get back to [facility] (as long as okay with hip surgeon).  Encouraged him to start as he feels comfortable (he wants to start in wheelchair).  I did tell him that I don't want fear of falling to keep him from getting back involved with activities at [facility].  Talked about risk of deconditioning.  2.  Dysphagia  -he has a zenkers diverticulum.  He had a MBE on 12/11/15 and there was moderate oropharyngeal dysphagia.  There was laryngeal penetration of nectar thick liquid.  Small zenkers diverticulum noted.  Regular diet recommended. He has been fairly stable in this regard but has to  be careful.  He has a new partial and finds that if he focuses on eating and not talking, he does better.    3.  B12 deficiency  -continue this faithfully. His B12 was 192 was prior to starting supplement.  4.  REM behavior disorder  -talked about padding his bedrail.   -he d/c klonopin as had hang over effect.  Needs to use the physical barriers and uses bed rails.  Talked about rozerem if needed in the future  5.  Nocturia  -told them to f/u with urology to see if PTNS beneficial.    6.  Follow up is anticipated in the next few months, sooner should new neurologic issues arise.   Cc:  Letvak, Richard I, MD 

## 2017-10-24 ENCOUNTER — Encounter: Payer: Self-pay | Admitting: Neurology

## 2017-10-24 ENCOUNTER — Ambulatory Visit: Payer: Medicare HMO | Admitting: Neurology

## 2017-10-24 VITALS — BP 108/62 | HR 72 | Ht 70.0 in | Wt 172.0 lb

## 2017-10-24 DIAGNOSIS — R351 Nocturia: Secondary | ICD-10-CM

## 2017-10-24 DIAGNOSIS — G20 Parkinson's disease: Secondary | ICD-10-CM

## 2017-10-24 DIAGNOSIS — G4752 REM sleep behavior disorder: Secondary | ICD-10-CM

## 2017-10-24 HISTORY — DX: REM sleep behavior disorder: G47.52

## 2017-10-24 NOTE — Patient Instructions (Signed)
Ask your urologist if percutaneous tibial nerve stimulation (PTNS), also referred to as posterior tibial nerve stimulation, is appropriate or would be helpful for your bladder  Add padding to your side rails of your bed  Get back to [place] steady boxing when you are able.

## 2017-11-06 ENCOUNTER — Other Ambulatory Visit: Payer: Self-pay | Admitting: *Deleted

## 2017-11-06 ENCOUNTER — Encounter: Payer: Self-pay | Admitting: Internal Medicine

## 2017-11-06 ENCOUNTER — Ambulatory Visit (INDEPENDENT_AMBULATORY_CARE_PROVIDER_SITE_OTHER): Payer: Medicare HMO | Admitting: Internal Medicine

## 2017-11-06 VITALS — BP 118/80 | HR 71 | Temp 97.7°F | Ht 70.0 in | Wt 169.0 lb

## 2017-11-06 DIAGNOSIS — N183 Chronic kidney disease, stage 3 unspecified: Secondary | ICD-10-CM

## 2017-11-06 DIAGNOSIS — S98131A Complete traumatic amputation of one right lesser toe, initial encounter: Secondary | ICD-10-CM | POA: Diagnosis not present

## 2017-11-06 DIAGNOSIS — Z23 Encounter for immunization: Secondary | ICD-10-CM | POA: Diagnosis not present

## 2017-11-06 DIAGNOSIS — I2692 Saddle embolus of pulmonary artery without acute cor pulmonale: Secondary | ICD-10-CM

## 2017-11-06 DIAGNOSIS — I10 Essential (primary) hypertension: Secondary | ICD-10-CM | POA: Diagnosis not present

## 2017-11-06 DIAGNOSIS — Z7189 Other specified counseling: Secondary | ICD-10-CM

## 2017-11-06 DIAGNOSIS — G20 Parkinson's disease: Secondary | ICD-10-CM

## 2017-11-06 DIAGNOSIS — Z0000 Encounter for general adult medical examination without abnormal findings: Secondary | ICD-10-CM | POA: Diagnosis not present

## 2017-11-06 NOTE — Assessment & Plan Note (Signed)
Recurrent thrombosis and PE without provocation Will stay on eliquis indefinitely

## 2017-11-06 NOTE — Assessment & Plan Note (Signed)
I have personally reviewed the Medicare Annual Wellness questionnaire and have noted 1. The patient's medical and social history 2. Their use of alcohol, tobacco or illicit drugs 3. Their current medications and supplements 4. The patient's functional ability including ADL's, fall risks, home safety risks and hearing or visual             impairment. 5. Diet and physical activities 6. Evidence for depression or mood disorders  The patients weight, height, BMI and visual acuity have been recorded in the chart I have made referrals, counseling and provided education to the patient based review of the above and I have provided the pt with a written personalized care plan for preventive services.  I have provided you with a copy of your personalized plan for preventive services. Please take the time to review along with your updated medication list.  Yearly flu vaccine No cancer screening due to age Discussed restarting some degree of exercise

## 2017-11-06 NOTE — Assessment & Plan Note (Signed)
Site is clean  no action needed

## 2017-11-06 NOTE — Assessment & Plan Note (Signed)
BP Readings from Last 3 Encounters:  11/06/17 118/80  10/24/17 108/62  10/16/17 134/72   Good control No change needed

## 2017-11-06 NOTE — Assessment & Plan Note (Signed)
Ongoing disability between this, the hip fracture, kidney stones, etc

## 2017-11-06 NOTE — Addendum Note (Signed)
Addended by: Pilar Grammes on: 11/06/2017 12:04 PM   Modules accepted: Orders

## 2017-11-06 NOTE — Assessment & Plan Note (Signed)
GFR stable No change for now

## 2017-11-06 NOTE — Progress Notes (Signed)
Hearing Screening Comments: Has hearing aids. Not wearing them today Vision Screening Comments: November 2018

## 2017-11-06 NOTE — Progress Notes (Signed)
Subjective:    Patient ID: Walter [name], male    DOB: 05-22-1931, 84 y.o.   MRN: [id number]  HPI Here for Medicare wellness visit and follow up of chronic health conditions Reviewed form and advanced directives Reviewed other doctors No alcohol or tobacco Not able to exercise Did have the fall with hip fracture No depression--though he is concerned about wife (due to all her increased responsibilities) Not anhedonic Feels his vision is worsening--may need cataracts evaluated Hearing aides---doesn't always wear them Some mild memory issues  Still not driving since the hip fracture Aide comes in 3 times a week to help shower Still needs help cleaning up after BM Assist with dressing. He brushes teeth and hair, etc  Parkinson's seems stable Walking with walker Continues with Dr Tat Didn't get back to [facility] as yet  No chest pain  No SOB No cough No dizziness or syncope No edema Continues on eliquis  Reviewed labs Stable GFR in 30's-40  Ongoing trouble swallowing Okay with chewable or crushable meds Has to be careful with food also  Current Outpatient Medications on File Prior to Visit  Medication Sig Dispense Refill  . acetaminophen (TYLENOL) 325 MG tablet Take 2 tablets (650 mg total) by mouth every 6 (six) hours as needed for mild pain or moderate pain (or Fever >/= 101).    Marland Kitchen amLODipine (NORVASC) 5 MG tablet TAKE 1 TABLET EVERY DAY 90 tablet 3  . carbidopa-levodopa (SINEMET IR) 25-100 MG tablet TAKE 1 TABLET THREE TIMES DAILY 270 tablet 3  . Cholecalciferol (VITAMIN D) 2000 UNITS CAPS Take 2,000 Units by mouth daily.     Marland Kitchen ELIQUIS 5 MG TABS tablet TAKE 1 TABLET TWICE DAILY 180 tablet 11  . levothyroxine (SYNTHROID, LEVOTHROID) 112 MCG tablet TAKE 1 TABLET EVERY DAY BEFORE BREAKFAST 90 tablet 3  . polyethylene glycol (MIRALAX / GLYCOLAX) packet Take 17 g by mouth daily.     . vitamin B-12 (CYANOCOBALAMIN) 1000 MCG tablet Take 1,000 mcg by mouth daily.      No current facility-administered medications on file prior to visit.     Allergies  Allergen Reactions  . Quinolones     tendonitis    Past Medical History:  Diagnosis Date  . ARF (acute renal failure) ([place]) 2011   after TKR  . BPH (benign prostatic hypertrophy)   . DVT (deep venous thrombosis) ([place]) 1998   after left knee arthroscopy  . Hx of colonoscopy 2000  . Hypertension   . Hypothyroid 2007   postop from thyroidectomy (cancer scare)  . Kidney stones    recurrent  . Oral cancer ([place]) 2004   graft from left thigh  . Osteoarthritis of both knees   . Parkinson's disease ([place])   . Pulmonary embolism ([place])   . Tendonitis 2012   from quinolone  . Toe amputation status ([place]) 2010    Past Surgical History:  Procedure Laterality Date  . APPENDECTOMY    . CHOLECYSTECTOMY    . CYSTOSCOPY/URETEROSCOPY/HOLMIUM LASER/STENT PLACEMENT Right 09/13/2017   Procedure: CYSTOSCOPY/URETEROSCOPY/HOLMIUM LASER/STENT PLACEMENT;  Surgeon: Hollice Espy, MD;  Location: [facility] ORS;  Service: Urology;  Laterality: Right;  . HERNIA REPAIR    . INTRAMEDULLARY (IM) NAIL INTERTROCHANTERIC Right 06/13/2017   Procedure: INTRAMEDULLARY (IM) NAIL INTERTROCHANTRIC;  Surgeon: Hessie Knows, MD;  Location: [facility] ORS;  Service: Orthopedics;  Laterality: Right;  . LITHOTRIPSY  2009/2010   then stent and cystoscopic removal 2014  . Oral cancer removed Right 2004  . REPAIR  ZENKER'S DIVERTICULA  2013  . THYROIDECTOMY  2007  . TOE AMPUTATION Right 2010   badly overlapping other toe  . TOTAL KNEE ARTHROPLASTY Left   . TRANSURETHRAL RESECTION OF PROSTATE  2011  . UMBILICAL HERNIA REPAIR  2007    Family History  Problem Relation Age of Onset  . Dementia Mother   . Stroke Father   . Pulmonary disease Brother   . Heart disease Neg Hx   . Diabetes Neg Hx     Social History   Socioeconomic History  . Marital status: Married    Spouse name: Not on file  . Number of children: 3  . Years of  education: Not on file  . Highest education level: Not on file  Occupational History  . 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 Financial resource strain: Not on file  . Food insecurity:    Worry: Not on file    Inability: Not on file  . Transportation needs:    Medical: Not on file    Non-medical: Not on file  Tobacco Use  . Smoking status: Former Smoker    Types: Cigarettes    Last attempt to quit: 04/27/2002    Years since quitting: 15.5  . Smokeless tobacco: Never Used  Substance and Sexual Activity  . Alcohol use: Not Currently    Alcohol/week: 0.0 standard drinks    Comment: one drink daily (wine or scotch)  . Drug use: No  . Sexual activity: Not Currently  Lifestyle  . Physical activity:    Days per week: Not on file    Minutes per session: Not on file  . Stress: Not on file  Relationships  . Social connections:    Talks on phone: Not on file    Gets together: Not on file    Attends religious service: Not on file    Active member of club or organization: Not on file    Attends meetings of clubs or organizations: Not on file    Relationship status: Not on file  . Intimate partner violence:    Fear of current or ex partner: Not on file    Emotionally abused: Not on file    Physically abused: Not on file    Forced sexual activity: Not on file  Other Topics Concern  . Not on file  Social History Narrative   Son works for [employer]   1 daughter in [place], other in [place] living will   Wife, then son, would be health care POA   He isn't sure about DNR--will accept resuscitation for now   No tube feeds if cognitively unaware   Review of Systems Appetite is good Weight fairly stable Sleeps okay---but disturbed by frequent nocturia (4-6). Goes every 2 hours in the day. Flow is okay Wears seat belt Partial upper plate---needs an extraction soon No rash or suspicious skin  lesions Easy bruising with the eliquis No sig arthritis pain---still has some residual hip pain     Objective:   Physical Exam  Constitutional: He is oriented to person, place, and time. He appears well-developed. No distress.  HENT:  Mouth/Throat: Oropharynx is clear and moist. No oropharyngeal exudate.  Neck: No thyromegaly present.  Cardiovascular: Normal rate, regular rhythm and normal heart sounds. Exam reveals no gallop.  No murmur heard. Faint pedal pulses  Respiratory: Effort normal and breath sounds normal. No respiratory distress. He  has no wheezes. He has no rales.  GI: Soft. There is no tenderness.  Musculoskeletal: He exhibits no tenderness.  1+ edema in feet and calves Right 2nd toe amputation site is clean and dry  Lymphadenopathy:    He has no cervical adenopathy.  Neurological: He is alert and oriented to person, place, and time.  President--- "Dwaine Deter, the family--from [place]" [phone] [id number] Recall 2/3  No tremor Significant bradykinesia and mild stiffness  Skin: No rash noted. No erythema.  Psychiatric: He has a normal mood and affect. His behavior is normal.  Frustrated by his disability and stress for wife           Assessment & Plan:

## 2017-11-06 NOTE — Telephone Encounter (Signed)
This is not due to be filled yet

## 2017-11-06 NOTE — Assessment & Plan Note (Signed)
See social history 

## 2017-11-22 ENCOUNTER — Ambulatory Visit
Admission: RE | Admit: 2017-11-22 | Discharge: 2017-11-22 | Disposition: A | Discharge status: Home / Self Care | Payer: Medicare HMO | Source: Ambulatory Visit | Attending: Urology | Admitting: Urology

## 2017-11-22 DIAGNOSIS — N281 Cyst of kidney, acquired: Secondary | ICD-10-CM | POA: Diagnosis not present

## 2017-11-22 DIAGNOSIS — Z87442 Personal history of urinary calculi: Secondary | ICD-10-CM | POA: Diagnosis not present

## 2017-11-27 ENCOUNTER — Ambulatory Visit (INDEPENDENT_AMBULATORY_CARE_PROVIDER_SITE_OTHER): Payer: Medicare HMO | Admitting: Urology

## 2017-11-27 ENCOUNTER — Encounter: Payer: Self-pay | Admitting: Urology

## 2017-11-27 VITALS — BP 111/67 | Ht 70.0 in | Wt 170.0 lb

## 2017-11-27 DIAGNOSIS — N201 Calculus of ureter: Secondary | ICD-10-CM

## 2017-11-27 DIAGNOSIS — N200 Calculus of kidney: Secondary | ICD-10-CM | POA: Diagnosis not present

## 2017-11-27 DIAGNOSIS — N132 Hydronephrosis with renal and ureteral calculous obstruction: Secondary | ICD-10-CM

## 2017-11-27 NOTE — Progress Notes (Signed)
11/27/2017 1:56 PM   Walter [name] 1932/01/22 [id number]  Referring provider: Venia Carbon, MD [address] [place], [place] [address]  Chief Complaint  Patient presents with  . Follow-up    HPI: Patient is an 84 year old Caucasian male who is status post right URS/LL/ureteral stent placement on 09/13/2017 with his wife,  Walter [name].  He was found to have an 8 mm impacted right UVJ stone associated with right ureteral stones and underwent a right URS/LL/stent placement with cysto stent removal on 10/06/2017.    RUS on 11/22/2017 revealed interval resolution of hydronephrosis on the right. There is moderate diffuse renal cortical atrophy on the right. Simple appearing lower pole cyst.  Multiple nonobstructing lower pole cysts in the left kidney. No cortical thinning or hydronephrosis.  Partially distended urinary bladder with subjective mild wall thickening to approximately 8 mm.  Stone composition was 30% calcium oxalate dihydrate, 55% calcium oxalate monohydrate and 15% calcium phosphate.    At this time, he is no longer having any flank pain or gross hematuria.  Patient denies dysuria or suprapubic/flank pain.  Patient denies any fevers, chills, nausea or vomiting.     PMH: Past Medical History:  Diagnosis Date  . ARF (acute renal failure) ([place]) 2011   after TKR  . BPH (benign prostatic hypertrophy)   . DVT (deep venous thrombosis) ([place]) 1998   after left knee arthroscopy  . Hx of colonoscopy 2000  . Hypertension   . Hypothyroid 2007   postop from thyroidectomy (cancer scare)  . Kidney stones    recurrent  . Oral cancer ([place]) 2004   graft from left thigh  . Osteoarthritis of both knees   . Parkinson's disease ([place])   . Pulmonary embolism ([place])   . Tendonitis 2012   from quinolone  . Toe amputation status ([place]) 2010    Surgical History: Past Surgical History:  Procedure Laterality Date  . APPENDECTOMY    . CHOLECYSTECTOMY    .  CYSTOSCOPY/URETEROSCOPY/HOLMIUM LASER/STENT PLACEMENT Right 09/13/2017   Procedure: CYSTOSCOPY/URETEROSCOPY/HOLMIUM LASER/STENT PLACEMENT;  Surgeon: Hollice Espy, MD;  Location: [facility] ORS;  Service: Urology;  Laterality: Right;  . HERNIA REPAIR    . INTRAMEDULLARY (IM) NAIL INTERTROCHANTERIC Right 06/13/2017   Procedure: INTRAMEDULLARY (IM) NAIL INTERTROCHANTRIC;  Surgeon: Hessie Knows, MD;  Location: [facility] ORS;  Service: Orthopedics;  Laterality: Right;  . LITHOTRIPSY  2009/2010   then stent and cystoscopic removal 2014  . Oral cancer removed Right 2004  . REPAIR ZENKER'S DIVERTICULA  2013  . THYROIDECTOMY  2007  . TOE AMPUTATION Right 2010   badly overlapping other toe  . TOTAL KNEE ARTHROPLASTY Left   . TRANSURETHRAL RESECTION OF PROSTATE  2011  . UMBILICAL HERNIA REPAIR  2007    Home Medications:  Allergies as of 11/27/2017      Reactions   Quinolones    tendonitis      Medication List        Accurate as of 11/27/17  1:56 PM. Always use your most recent med list.          acetaminophen 325 MG tablet Commonly known as:  TYLENOL Take 2 tablets (650 mg total) by mouth every 6 (six) hours as needed for mild pain or moderate pain (or Fever >/= 101).   amLODipine 5 MG tablet Commonly known as:  NORVASC TAKE 1 TABLET EVERY DAY   carbidopa-levodopa 25-100 MG tablet Commonly known as:  SINEMET IR TAKE 1 TABLET THREE TIMES DAILY  ELIQUIS 5 MG Tabs tablet Generic drug:  apixaban TAKE 1 TABLET TWICE DAILY   levothyroxine 112 MCG tablet Commonly known as:  SYNTHROID, LEVOTHROID TAKE 1 TABLET EVERY DAY BEFORE BREAKFAST   polyethylene glycol packet Commonly known as:  MIRALAX / GLYCOLAX Take 17 g by mouth daily.   vitamin B-12 1000 MCG tablet Commonly known as:  CYANOCOBALAMIN Take 1,000 mcg by mouth daily.   Vitamin D 2000 units Caps Take 2,000 Units by mouth daily.       Allergies:  Allergies  Allergen Reactions  . Quinolones     tendonitis    Family  History: Family History  Problem Relation Age of Onset  . Dementia Mother   . Stroke Father   . Pulmonary disease Brother   . Heart disease Neg Hx   . Diabetes Neg Hx     Social History:  reports that he quit smoking about 15 years ago. His smoking use included cigarettes. He has never used smokeless tobacco. He reports that he drank alcohol. He reports that he does not use drugs.  ROS: UROLOGY Frequent Urination?: No Hard to postpone urination?: No Burning/pain with urination?: No Get up at night to urinate?: No Leakage of urine?: No Urine stream starts and stops?: No Trouble starting stream?: No Do you have to strain to urinate?: No Blood in urine?: No Urinary tract infection?: No Sexually transmitted disease?: No Injury to kidneys or bladder?: No Painful intercourse?: No Weak stream?: No Erection problems?: No Penile pain?: No  Gastrointestinal Nausea?: No Vomiting?: No Indigestion/heartburn?: No Diarrhea?: No Constipation?: No  Constitutional Fever: No Night sweats?: No Weight loss?: No Fatigue?: No  Skin Skin rash/lesions?: No Itching?: No  Eyes Blurred vision?: No Double vision?: No  Ears/Nose/Throat Sore throat?: No Sinus problems?: No  Hematologic/Lymphatic Swollen glands?: No Easy bruising?: No  Cardiovascular Leg swelling?: No Chest pain?: No  Respiratory Cough?: No Shortness of breath?: No  Endocrine Excessive thirst?: No  Musculoskeletal Back pain?: No Joint pain?: No  Neurological Headaches?: No Dizziness?: No  Psychologic Depression?: No Anxiety?: No  Physical Exam: BP 111/67 (BP Location: Left Arm, Patient Position: Sitting, Cuff Size: Normal)   Ht 5\' 10"  (1.778 m)   Wt 170 lb (77.1 kg) Comment: pt stated  BMI 24.39 kg/m   Constitutional:  Well nourished. Alert and oriented, No acute distress. HEENT: [place] AT, moist mucus membranes.  Trachea midline, no masses. Cardiovascular: No clubbing, cyanosis, or  edema. Respiratory: Normal respiratory effort, no increased work of breathing. Skin: No rashes, bruises or suspicious lesions. Lymph: No cervical or inguinal adenopathy. Neurologic: Grossly intact, no focal deficits, moving all 4 extremities. Psychiatric: Normal mood and affect.  Laboratory Data: Lab Results  Component Value Date   WBC 4.9 10/16/2017   HGB 13.0 10/16/2017   HCT 39.5 (L) 10/16/2017   MCV 91.5 10/16/2017   PLT 234 10/16/2017    Lab Results  Component Value Date   CREATININE 1.85 (H) 10/16/2017    Lab Results  Component Value Date   PSA 3.56 03/21/2016    No results found for: TESTOSTERONE  No results found for: HGBA1C  Lab Results  Component Value Date   TSH 0.41 09/29/2015       Component Value Date/Time   CHOL 155 07/31/2013 1320   HDL 34.60 (L) 07/31/2013 1320   CHOLHDL 4 07/31/2013 1320   VLDL 24.6 07/31/2013 1320   LDLCALC 96 07/31/2013 1320    Lab Results  Component Value Date   AST  16 10/16/2017   Lab Results  Component Value Date   ALT <5 10/16/2017   No components found for: ALKALINEPHOPHATASE No components found for: BILIRUBINTOTAL  No results found for: ESTRADIOL  Urinalysis    Component Value Date/Time   COLORURINE YELLOW (A) 09/09/2017 1104   APPEARANCEUR Cloudy (A) 10/06/2017 1011   LABSPEC 1.021 09/09/2017 1104   PHURINE 5.0 09/09/2017 1104   GLUCOSEU Negative 10/06/2017 1011   HGBUR LARGE (A) 09/09/2017 1104   BILIRUBINUR Negative 10/06/2017 1011   KETONESUR NEGATIVE 09/09/2017 1104   PROTEINUR 3+ (A) 10/06/2017 1011   PROTEINUR 100 (A) 09/09/2017 1104   UROBILINOGEN negative 02/17/2014 1643   NITRITE Positive (A) 10/06/2017 1011   NITRITE NEGATIVE 09/09/2017 1104   LEUKOCYTESUR Negative 10/06/2017 1011    I have reviewed the labs.   Pertinent Imaging: CLINICAL DATA:  History of kidney stones, silent hydronephrosis, impacted ureteral stone.  EXAM: RENAL / URINARY TRACT ULTRASOUND  COMPLETE  COMPARISON:  Abdominal CT scan of September 09, 2017  FINDINGS: Right Kidney:  Length: 10.3 cm. The renal cortical echotexture remains lower than that of the adjacent liver. There is diffuse cortical thinning. There is a lower pole cyst measuring approximately 8 mm in greatest dimension. Previously demonstrated hydronephrosis has resolved.  Left Kidney:  Length: 10.1 cm. The renal cortical echotexture is similar to that on the right. There is no cortical thinning. There multiple lower pole stones measuring up to 7.4 mm in diameter. There is no hydronephrosis.  Bladder:  The urinary bladder is only partially distended. There is subjective mild bladder wall thickening similar to that seen on the previous study.  IMPRESSION: Interval resolution of hydronephrosis on the right. There is moderate diffuse renal cortical atrophy on the right. Simple appearing lower pole cyst.  Multiple nonobstructing lower pole cysts in the left kidney. No cortical thinning or hydronephrosis.  Partially distended urinary bladder with subjective mild wall thickening to approximately 8 mm.   Electronically Signed   By: David  Martinique M.D.   On: 11/23/2017 07:43 I have independently reviewed the films.    Assessment & Plan:    1. Right ureteral stones S/p urs  2. Right hydronephrosis the hydronephrosis has resolved   3. History of nephrolithiasis Offered 20-URKY metabolic work-up, patient declined Did give the patient the ABCs of kidney stone prevention   Return if symptoms worsen or fail to improve.  These notes generated with voice recognition software. I apologize for typographical errors.  Zara Council, PA-C  [facility] [address]  [place], [place] [address] [phone]

## 2017-11-30 ENCOUNTER — Ambulatory Visit: Payer: Medicare HMO | Admitting: Family Medicine

## 2017-12-11 DIAGNOSIS — Z8781 Personal history of (healed) traumatic fracture: Secondary | ICD-10-CM | POA: Diagnosis not present

## 2017-12-11 DIAGNOSIS — Z9889 Other specified postprocedural states: Secondary | ICD-10-CM | POA: Diagnosis not present

## 2017-12-11 DIAGNOSIS — S72142D Displaced intertrochanteric fracture of left femur, subsequent encounter for closed fracture with routine healing: Secondary | ICD-10-CM | POA: Diagnosis not present

## 2017-12-12 DIAGNOSIS — M2012 Hallux valgus (acquired), left foot: Secondary | ICD-10-CM | POA: Diagnosis not present

## 2017-12-12 DIAGNOSIS — M79675 Pain in left toe(s): Secondary | ICD-10-CM | POA: Diagnosis not present

## 2017-12-12 DIAGNOSIS — M2042 Other hammer toe(s) (acquired), left foot: Secondary | ICD-10-CM | POA: Diagnosis not present

## 2017-12-12 DIAGNOSIS — M79674 Pain in right toe(s): Secondary | ICD-10-CM | POA: Diagnosis not present

## 2017-12-12 DIAGNOSIS — D2372 Other benign neoplasm of skin of left lower limb, including hip: Secondary | ICD-10-CM | POA: Diagnosis not present

## 2017-12-12 DIAGNOSIS — M2041 Other hammer toe(s) (acquired), right foot: Secondary | ICD-10-CM | POA: Diagnosis not present

## 2017-12-12 DIAGNOSIS — M2011 Hallux valgus (acquired), right foot: Secondary | ICD-10-CM | POA: Diagnosis not present

## 2017-12-12 DIAGNOSIS — B351 Tinea unguium: Secondary | ICD-10-CM | POA: Diagnosis not present

## 2018-01-08 ENCOUNTER — Telehealth: Payer: Self-pay | Admitting: *Deleted

## 2018-01-08 ENCOUNTER — Other Ambulatory Visit: Payer: Self-pay | Admitting: *Deleted

## 2018-01-08 DIAGNOSIS — I2692 Saddle embolus of pulmonary artery without acute cor pulmonale: Secondary | ICD-10-CM

## 2018-01-08 NOTE — Telephone Encounter (Signed)
Called patient to inquire if he is taking Eliquis 5 mg bid.  Patient confirmed that he is.  Also asked if he could come in for lab this week, per Gaspar Bidding, NP.  Patient can come on Thursday, Nov. 14 @ 11:00.  Scheduling notified.

## 2018-01-11 ENCOUNTER — Inpatient Hospital Stay: Payer: Medicare HMO | Attending: Hematology and Oncology

## 2018-01-11 DIAGNOSIS — Z86711 Personal history of pulmonary embolism: Secondary | ICD-10-CM | POA: Insufficient documentation

## 2018-01-11 DIAGNOSIS — I2692 Saddle embolus of pulmonary artery without acute cor pulmonale: Secondary | ICD-10-CM

## 2018-01-11 DIAGNOSIS — E538 Deficiency of other specified B group vitamins: Secondary | ICD-10-CM | POA: Insufficient documentation

## 2018-01-11 LAB — COMPREHENSIVE METABOLIC PANEL
ALT: 6 U/L (ref 0–44)
AST: 16 U/L (ref 15–41)
Albumin: 4.4 g/dL (ref 3.5–5.0)
Alkaline Phosphatase: 67 U/L (ref 38–126)
Anion gap: 6 (ref 5–15)
BUN: 27 mg/dL — ABNORMAL HIGH (ref 8–23)
CO2: 31 mmol/L (ref 22–32)
Calcium: 9.6 mg/dL (ref 8.9–10.3)
Chloride: 105 mmol/L (ref 98–111)
Creatinine, Ser: 1.59 mg/dL — ABNORMAL HIGH (ref 0.61–1.24)
GFR calc Af Amer: 44 mL/min — ABNORMAL LOW (ref >60)
GFR calc non Af Amer: 38 mL/min — ABNORMAL LOW (ref >60)
Glucose, Bld: 105 mg/dL — ABNORMAL HIGH (ref 70–99)
Potassium: 4.2 mmol/L (ref 3.5–5.1)
Sodium: 142 mmol/L (ref 135–145)
Total Bilirubin: 1 mg/dL (ref 0.3–1.2)
Total Protein: 7.4 g/dL (ref 6.5–8.1)

## 2018-01-11 LAB — CBC WITH DIFFERENTIAL/PLATELET
Abs Immature Granulocytes: 0.01 10*3/uL (ref 0.00–0.07)
Basophils Absolute: 0 10*3/uL (ref 0.0–0.1)
Basophils Relative: 0 %
Eosinophils Absolute: 0.1 10*3/uL (ref 0.0–0.5)
Eosinophils Relative: 1 %
HCT: 43.2 % (ref 39.0–52.0)
Hemoglobin: 14 g/dL (ref 13.0–17.0)
Immature Granulocytes: 0 %
Lymphocytes Relative: 20 %
Lymphs Abs: 1.2 10*3/uL (ref 0.7–4.0)
MCH: 29.9 pg (ref 26.0–34.0)
MCHC: 32.4 g/dL (ref 30.0–36.0)
MCV: 92.3 fL (ref 80.0–100.0)
Monocytes Absolute: 0.6 10*3/uL (ref 0.1–1.0)
Monocytes Relative: 11 %
Neutro Abs: 3.9 10*3/uL (ref 1.7–7.7)
Neutrophils Relative %: 68 %
Platelets: 232 10*3/uL (ref 150–400)
RBC: 4.68 MIL/uL (ref 4.22–5.81)
RDW: 13 % (ref 11.5–15.5)
WBC: 5.7 10*3/uL (ref 4.0–10.5)
nRBC: 0 % (ref 0.0–0.2)

## 2018-02-05 ENCOUNTER — Other Ambulatory Visit: Payer: Self-pay | Admitting: Urgent Care

## 2018-02-05 ENCOUNTER — Telehealth: Payer: Self-pay

## 2018-02-05 ENCOUNTER — Telehealth: Payer: Self-pay | Admitting: *Deleted

## 2018-02-05 MED ORDER — APIXABAN 2.5 MG PO TABS: 5.0000 mg | ORAL_TABLET | Freq: Two times a day (BID) | ORAL | 3 refills | Status: DC

## 2018-02-05 NOTE — Telephone Encounter (Signed)
  I think we can decrease his Eliquis to 2.5 mg BID.  M

## 2018-02-05 NOTE — Telephone Encounter (Signed)
Wife called reporting that patient had labs drawn 01/16/18 and that no one has called them regarding follow up and results. He is scheduled for follow up appointment in May  ...   Ref Range & Units 3wk ago  WBC 4.0 - 10.5 K/uL 5.7   RBC 4.22 - 5.81 MIL/uL 4.68   Hemoglobin 13.0 - 17.0 g/dL 14.0   HCT 39.0 - 52.0 % 43.2   MCV 80.0 - 100.0 fL 92.3   MCH 26.0 - 34.0 pg 29.9   MCHC 30.0 - 36.0 g/dL 32.4   RDW 11.5 - 15.5 % 13.0   Platelets 150 - 400 K/uL 232   nRBC 0.0 - 0.2 % 0.0   Neutrophils Relative % % 68   Neutro Abs 1.7 - 7.7 K/uL 3.9   Lymphocytes Relative % 20   Lymphs Abs 0.7 - 4.0 K/uL 1.2   Monocytes Relative % 11   Monocytes Absolute 0.1 - 1.0 K/uL 0.6   Eosinophils Relative % 1   Eosinophils Absolute 0.0 - 0.5 K/uL 0.1   Basophils Relative % 0   Basophils Absolute 0.0 - 0.1 K/uL 0.0   Immature Granulocytes % 0   Abs Immature Granulocytes 0.00 - 0.07 K/uL 0.01   Comment: Performed at [facility], [address] [place], [place] [address]  Resulting Agency  [facility]      Specimen Collected: 01/11/18 11:25  Last Resulted: 01/11/18 12:00       ...   Ref Range & Units 3wk ago  Sodium 135 - 145 mmol/L 142   Potassium 3.5 - 5.1 mmol/L 4.2   Chloride 98 - 111 mmol/L 105   CO2 22 - 32 mmol/L 31   Glucose, Bld 70 - 99 mg/dL 105High    BUN 8 - 23 mg/dL 27High    Creatinine, Ser 0.61 - 1.24 mg/dL 1.59High    Calcium 8.9 - 10.3 mg/dL 9.6   Total Protein 6.5 - 8.1 g/dL 7.4   Albumin 3.5 - 5.0 g/dL 4.4   AST 15 - 41 U/L 16   ALT 0 - 44 U/L 6   Alkaline Phosphatase 38 - 126 U/L 67   Total Bilirubin 0.3 - 1.2 mg/dL 1.0   GFR calc non Af Amer >60 mL/min 38Low    GFR calc Af Amer >60 mL/min 44Low    Comment: (NOTE)  The eGFR has been calculated using the CKD EPI equation.  This calculation has not been validated in all clinical situations.  eGFR's persistently <60 mL/min signify possible Chronic Kidney  Disease.   Anion gap 5 - 15 6   Comment: Performed  at [facility], [address] [place], [place] [address]  Resulting Agency  [facility]      Specimen Collected: 01/11/18 11:25  Last Resulted: 01/11/18 12:11

## 2018-02-05 NOTE — Telephone Encounter (Signed)
spoke with the patient to inform him that he need to reduce the dose of the Equlis form 5 mg BID to 2.5 mg BID. The patient was upset due to the reason  He states he just paid 300 dollars for the medication. The patient was unberstanding and agreeable to start the 2.5 mg BID.

## 2018-02-05 NOTE — Telephone Encounter (Signed)
Labs reviewed. Renal function has improved significantly over what it has been in the past, but his CrCl is still 37.7 mL/min. Should we not renal dose adjust his apixaban to 2.5 mg BID?

## 2018-04-19 ENCOUNTER — Ambulatory Visit: Payer: Medicare HMO | Admitting: Hematology and Oncology

## 2018-04-19 ENCOUNTER — Other Ambulatory Visit: Payer: Medicare HMO

## 2018-04-26 NOTE — Progress Notes (Signed)
  Walter [name] was seen today in the movement disorders clinic for neurologic consultation at the request of Letvak, Richard I, MD.  The consultation is for the evaluation of Parkinson's disease.  The patient reports he has never seen a neurologist previously.  This patient is accompanied in the office by his spouse who supplements the history.  I have had the opportunity to review records from his primary care physician regarding the diagnosis.  The patient was diagnosed with Parkinson's disease by his primary care physician in June, 2016 and placed on levodopa.  The patient indicates that after starting the medication, he felt that his symptoms were much better.  His balance was better.  Reports that Tai Chi helped his balance as well.  Pt reports that his first sx was slowness, shuffling and balance trouble.    04/01/16 update:  Pt f/u today, accompanied by his wife who supplements the hx.  On carbidopa/levodopa 25/100 tid.  Pt denies falls.  Pt denies lightheadedness, near syncope.  No hallucinations.  Mood has been good.  The records that were made available to me were reviewed.  He had a MBE on 12/11/15 and there was moderate oropharyngeal dysphagia.  There was laryngeal penetration of nectar thick liquid.  Small zenkers diverticulum noted.  Regular diet recommended.  Asks about going to [facility] and that is his biggest c/o.  Speech getting quiet.  Used to enjoy singing and now can't and would like to get back to that.  MRI brain done and reviewed on 12/03/15.  Demonstrated only mod atrophy and mod small vessel disease.  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  He was d/c on 03/05/16 on eliquis.  Back on the upswing but not been able to exercise yet.    08/18/16 update:  Patient seen today in follow-up.  He is accompanied by his wife who supplements the history.  Patient remains on carbidopa/levodopa 25/100, one tablet 3 times per day.  He did complete LSVT big and loud since  our last visit.  He states that no falls.  He is not exercising.  States that is because his wife is getting ready to have back surgery and decided not to enter a program until she has gotten well.  He is still taking his B12 liquid supplement.  12/20/16 update: Patient is seen today in follow-up for his parkinsonism.  He is accompanied by his wife who supplements the history.  The patient is supposed to be on carbidopa/levodopa 25/100, 1 tablet 3 times per day but admits he often misses the middle of the day dosage.  Wife notes more shuffling.  Records have been reviewed since our last visit.  He has had his yearly physical since last visit.  He has had some falls.  States that he was feeling "pretty good" and he was gardening and he tripped himself on roots and he had to have some help by a neighbor getting off the ground.  He has also fallen out of bed 2 times with active dreams.  He actually put a hold in the wall with his shoulder/elbow when falling out of the bed. No lightheadedness or near syncope.  No hallucinations.  No visual distortions.  04/28/17 update: Patient is seen today in follow-up for parkinsonism.  He is accompanied by his wife who supplements the history.  Talked to the patient last visit about making sure he takes the middle of the day dose of his levodopa.    He is on carbidopa/levodopa 25/100, 1 tablet 3 times per day.  No falls.  No lightheadedness.  No hallucinations.  He is doing rock Public librarian.  We added low-dose clonazepam last visit for REM behavior disorder.  He reports that he d/c it due to fact that it made him feel like a zombie and he had a hang over effect.  He tried it for 3 nights.  It did help the dreams.  He d/c it but has had no violent dreams.   records were reviewed since last visit.  He saw hematology/oncology on March 10, 2017.  He remains on Eliquis.  He has some swallow trouble due to zenkers and he has to be careful.  10/24/17 update: Patient seen today in  follow-up for Parkinson's.  He is accompanied by his wife who supplements history.  He is on carbidopa/levodopa 25/100, 1 tablet 3 times per day.   He fx his R hip at home.  Got out of the car and fell on the cement.  He had surgery at [place].  He went to rehab at [facility].  Not long thereafter, he was admitted on July 17 because of right distal ureteral calculus and severe right hydronephrosis.  He had laser lithotripsy and stent placement.  Stent has since been removed.   In regards REM behavior disorder, the patient states that he had one more violent dream and he hit the bedrail and hurt his arm.  records are reviewed since last visit.    04/30/18 update:  Pt seen in f/u for PD.  This patient is accompanied in the office by his spouse who supplements the history.  On carbidopa/levodopa 25/100 tid.  Taking last one at 10pm and first one at [place] denies falls.  Pt denies lightheadedness, near syncope.  No hallucinations.  Mood has been good.  Not doing much exercise.  Feels like just getting energy back since fall a year ago in April.  The records that were made available to me were reviewed since our last visit.  PREVIOUS MEDICATIONS: Sinemet; klonopin - 0.25 mg with hang over effect  ALLERGIES:   Allergies  Allergen Reactions  . Quinolones     tendonitis    CURRENT MEDICATIONS:  Outpatient Encounter Medications as of 04/30/2018  Medication Sig  . acetaminophen (TYLENOL) 325 MG tablet Take 2 tablets (650 mg total) by mouth every 6 (six) hours as needed for mild pain or moderate pain (or Fever >/= 101).  Marland Kitchen amLODipine (NORVASC) 5 MG tablet TAKE 1 TABLET EVERY DAY  . apixaban (ELIQUIS) 2.5 MG TABS tablet Take 2 tablets (5 mg total) by mouth 2 (two) times daily.  . carbidopa-levodopa (SINEMET IR) 25-100 MG tablet TAKE 1 TABLET THREE TIMES DAILY  . Cholecalciferol (VITAMIN D) 2000 UNITS CAPS Take 2,000 Units by mouth daily.   Marland Kitchen levothyroxine (SYNTHROID, LEVOTHROID) 112 MCG tablet TAKE 1 TABLET  EVERY DAY BEFORE BREAKFAST  . polyethylene glycol (MIRALAX / GLYCOLAX) packet Take 17 g by mouth daily.   . vitamin B-12 (CYANOCOBALAMIN) 1000 MCG tablet Take 1,000 mcg by mouth daily.   No facility-administered encounter medications on file as of 04/30/2018.     PAST MEDICAL HISTORY:   Past Medical History:  Diagnosis Date  . ARF (acute renal failure) ([place]) 2011   after TKR  . BPH (benign prostatic hypertrophy)   . DVT (deep venous thrombosis) ([place]) 1998   after left knee arthroscopy  . Hx of colonoscopy 2000  . Hypertension   .  Hypothyroid 2007   postop from thyroidectomy (cancer scare)  . Kidney stones    recurrent  . Oral cancer ([place]) 2004   graft from left thigh  . Osteoarthritis of both knees   . Parkinson's disease ([place])   . Pulmonary embolism ([place])   . Tendonitis 2012   from quinolone  . Toe amputation status 2010    PAST SURGICAL HISTORY:   Past Surgical History:  Procedure Laterality Date  . APPENDECTOMY    . CHOLECYSTECTOMY    . CYSTOSCOPY/URETEROSCOPY/HOLMIUM LASER/STENT PLACEMENT Right 09/13/2017   Procedure: CYSTOSCOPY/URETEROSCOPY/HOLMIUM LASER/STENT PLACEMENT;  Surgeon: Hollice Espy, MD;  Location: [facility] ORS;  Service: Urology;  Laterality: Right;  . HERNIA REPAIR    . INTRAMEDULLARY (IM) NAIL INTERTROCHANTERIC Right 06/13/2017   Procedure: INTRAMEDULLARY (IM) NAIL INTERTROCHANTRIC;  Surgeon: Hessie Knows, MD;  Location: [facility] ORS;  Service: Orthopedics;  Laterality: Right;  . LITHOTRIPSY  2009/2010   then stent and cystoscopic removal 2014  . Oral cancer removed Right 2004  . REPAIR ZENKER'S DIVERTICULA  2013  . THYROIDECTOMY  2007  . TOE AMPUTATION Right 2010   badly overlapping other toe  . TOTAL KNEE ARTHROPLASTY Left   . TRANSURETHRAL RESECTION OF PROSTATE  2011  . UMBILICAL HERNIA REPAIR  2007    SOCIAL HISTORY:   Social History   Socioeconomic History  . Marital status: Married    Spouse name: Not on file  . Number of children: 3  .  Years of education: Not on file  . Highest education level: Not on file  Occupational History  . 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 Financial resource strain: Not on file  . Food insecurity:    Worry: Not on file    Inability: Not on file  . Transportation needs:    Medical: Not on file    Non-medical: Not on file  Tobacco Use  . Smoking status: Former Smoker    Types: Cigarettes    Last attempt to quit: 04/27/2002    Years since quitting: 16.0  . Smokeless tobacco: Never Used  Substance and Sexual Activity  . Alcohol use: Not Currently    Alcohol/week: 0.0 standard drinks    Comment: one drink daily (wine or scotch)  . Drug use: No  . Sexual activity: Not Currently  Lifestyle  . Physical activity:    Days per week: Not on file    Minutes per session: Not on file  . Stress: Not on file  Relationships  . Social connections:    Talks on phone: Not on file    Gets together: Not on file    Attends religious service: Not on file    Active member of club or organization: Not on file    Attends meetings of clubs or organizations: Not on file    Relationship status: Not on file  . Intimate partner violence:    Fear of current or ex partner: Not on file    Emotionally abused: Not on file    Physically abused: Not on file    Forced sexual activity: Not on file  Other Topics Concern  . Not on file  Social History Narrative   Son works for [employer]   1 daughter in [place], other in [place] living will   Wife, then son, would be health care POA   He isn't sure about DNR--will accept resuscitation for now  No tube feeds if cognitively unaware    FAMILY HISTORY:   Family Status  Relation Name Status  . Mother  Deceased at age 16       old age  . Father  Deceased at age 65       stroke  . Brother  Deceased       unknown-- had moved away  . Child 3,healthy Alive  . Neg Hx  (Not  Specified)    ROS:  Review of Systems  Constitutional: Negative.   HENT: Negative.   Eyes: Negative.   Cardiovascular: Negative.   Gastrointestinal: Negative.   Genitourinary: Positive for frequency and urgency.  Musculoskeletal: Negative.   Skin: Negative.   Endo/Heme/Allergies: Negative.      PHYSICAL EXAMINATION:    VITALS:   Vitals:   04/30/18 1403  BP: 118/70  Pulse: 72  SpO2: 98%  Weight: 174 lb (78.9 kg)  Height: '5\' 10"'$  (1.778 m)    GEN:  The patient appears stated age and is in NAD. HEENT:  Normocephalic, atraumatic.  The mucous membranes are moist. The superficial temporal arteries are without ropiness or tenderness. CV:  RRR Lungs:  CTAB Neck/HEME:  There are no carotid bruits bilaterally.  Neurological examination:  Orientation: The patient is alert and oriented x3. Cranial nerves: There is good facial symmetry with facial hypomimia.  The visual fields are full to confrontational testing. The speech is fluent and clear.  He is hypophonic.  Soft palate rises symmetrically and there is no tongue deviation. Hearing is intact to conversational tone. Sensation: Sensation is intact to light touch throughout. Motor: Strength is at least antigravity x 4.   Movement examination: Tone: There is normal tone in the UE/LE today Abnormal movements: There is no tremor today Coordination:  There is no decremation, with any form of RAMS, including alternating supination and pronation of the forearm, hand opening and closing, finger taps.  He has slow heel and toe taps on the left.   Gait and Station: The patient is unable to arise without the use of the hands.  He arises by pushing out of the chair.  He uses the walker.  He walks well with the walker.  He does freeze with the L leg once back in the room and in front of the chair.  Lab Results  Component Value Date   OVZCHYIF02 774 10/16/2017     Chemistry      Component Value Date/Time   NA 142 01/11/2018 1125   K  4.2 01/11/2018 1125   CL 105 01/11/2018 1125   CO2 31 01/11/2018 1125   BUN 27 (H) 01/11/2018 1125   CREATININE 1.59 (H) 01/11/2018 1125      Component Value Date/Time   CALCIUM 9.6 01/11/2018 1125   ALKPHOS 67 01/11/2018 1125   AST 16 01/11/2018 1125   ALT 6 01/11/2018 1125   BILITOT 1.0 01/11/2018 1125     Lab Results  Component Value Date   WBC 5.7 01/11/2018   HGB 14.0 01/11/2018   HCT 43.2 01/11/2018   MCV 92.3 01/11/2018   PLT 232 01/11/2018     ASSESSMENT/PLAN:  1.  Idiopathic Parkinson's disease  -We discussed the diagnosis as well as pathophysiology of the disease.  We discussed treatment options as well as prognostic indicators.  Patient education was provided.  -Increase carbidopa/levodopa 25/100 to 4 times a day.  meds are spread way too far apart.  Take carbidopa/levodopa 25/100, 1 tablet at 6 AM/10 AM/2 PM/6  PM.    Risks, benefits, side effects and alternative therapies were discussed.  The opportunity to ask questions was given and they were answered to the best of my ability.  The patient expressed understanding and willingness to follow the outlined treatment protocols.  -discussed driving in detail.  I worry about him driving.  He can't even do his seat belt by himself without his wife helping him.  I did give him information about medical driving programs.  Patient just had his license renewed, but I do not think it is a good idea for him to drive, nor does his wife.  His reaction time is quite slow.  -I do think it is a good idea that he gets back involved with rock steady boxing at [facility], where he lives.  2.  Dysphagia  -he has a zenkers diverticulum.  He had a MBE on 12/11/15 and there was moderate oropharyngeal dysphagia.  There was laryngeal penetration of nectar thick liquid.  Small zenkers diverticulum noted.  Regular diet recommended. He has been fairly stable in this regard but has to be careful.  He has a new partial and finds that if he focuses on  eating and not talking, he does better.    3.  B12 deficiency  -continue this faithfully. His B12 was 192 was prior to starting supplement.  4.  REM behavior disorder  -talked about padding his bedrail.   -he d/c klonopin as had hang over effect.  Needs to use the physical barriers and uses bed rails.  Talked about rozerem if needed in the future  5.  Nocturia  -told them to f/u with urology to see if PTNS beneficial.  He has not yet done this.  6.  Follow up is anticipated in the next 5 months, sooner should new neurologic issues arise.  Much greater than 50% of this visit was spent in counseling and coordinating care.  Total face to face time:  25 min   Cc:  Venia Carbon, MD

## 2018-04-30 ENCOUNTER — Ambulatory Visit: Payer: Medicare HMO | Admitting: Neurology

## 2018-04-30 ENCOUNTER — Encounter: Payer: Self-pay | Admitting: Neurology

## 2018-04-30 VITALS — BP 118/70 | HR 72 | Ht 70.0 in | Wt 174.0 lb

## 2018-04-30 DIAGNOSIS — G20 Parkinson's disease: Secondary | ICD-10-CM | POA: Diagnosis not present

## 2018-04-30 DIAGNOSIS — R351 Nocturia: Secondary | ICD-10-CM

## 2018-04-30 NOTE — Patient Instructions (Signed)
Take carbidopa/levodopa 25/100, 1 tablet at 6am/10am/2pm/6pm  Find someone to do the driving.  If you would like me to set up a driving program, let me know.  Get back to your boxing program.  The physicians and staff at [facility] are committed to providing excellent care. You may receive a survey requesting feedback about your experience at our office. We strive to receive "very good" responses to the survey questions. If you feel that your experience would prevent you from giving the office a "very good " response, please contact our office to try to remedy the situation. We may be reached at [phone]. Thank you for taking the time out of your busy day to complete the survey.

## 2018-05-14 ENCOUNTER — Ambulatory Visit: Payer: Medicare HMO | Admitting: Internal Medicine

## 2018-06-04 DIAGNOSIS — M9902 Segmental and somatic dysfunction of thoracic region: Secondary | ICD-10-CM | POA: Diagnosis not present

## 2018-06-04 DIAGNOSIS — M9903 Segmental and somatic dysfunction of lumbar region: Secondary | ICD-10-CM | POA: Diagnosis not present

## 2018-06-04 DIAGNOSIS — M5416 Radiculopathy, lumbar region: Secondary | ICD-10-CM | POA: Diagnosis not present

## 2018-06-04 DIAGNOSIS — M5134 Other intervertebral disc degeneration, thoracic region: Secondary | ICD-10-CM | POA: Diagnosis not present

## 2018-07-16 ENCOUNTER — Inpatient Hospital Stay: Payer: Medicare HMO | Admitting: Oncology

## 2018-07-16 ENCOUNTER — Inpatient Hospital Stay: Payer: Medicare HMO

## 2018-07-16 ENCOUNTER — Ambulatory Visit: Payer: Medicare HMO | Admitting: Hematology and Oncology

## 2018-07-16 ENCOUNTER — Other Ambulatory Visit: Payer: Medicare HMO

## 2018-07-25 ENCOUNTER — Other Ambulatory Visit: Payer: Self-pay

## 2018-07-25 ENCOUNTER — Other Ambulatory Visit: Payer: Self-pay | Admitting: Internal Medicine

## 2018-07-26 ENCOUNTER — Inpatient Hospital Stay (HOSPITAL_BASED_OUTPATIENT_CLINIC_OR_DEPARTMENT_OTHER): Payer: Medicare HMO | Admitting: Oncology

## 2018-07-26 ENCOUNTER — Other Ambulatory Visit: Payer: Self-pay

## 2018-07-26 ENCOUNTER — Inpatient Hospital Stay: Payer: Medicare HMO | Attending: Oncology

## 2018-07-26 ENCOUNTER — Encounter: Payer: Self-pay | Admitting: Oncology

## 2018-07-26 VITALS — BP 125/77 | HR 76 | Temp 97.7°F | Resp 18 | Ht 70.0 in | Wt 172.3 lb

## 2018-07-26 DIAGNOSIS — Z7901 Long term (current) use of anticoagulants: Secondary | ICD-10-CM | POA: Insufficient documentation

## 2018-07-26 DIAGNOSIS — E039 Hypothyroidism, unspecified: Secondary | ICD-10-CM

## 2018-07-26 DIAGNOSIS — G20 Parkinson's disease: Secondary | ICD-10-CM | POA: Diagnosis not present

## 2018-07-26 DIAGNOSIS — Z85818 Personal history of malignant neoplasm of other sites of lip, oral cavity, and pharynx: Secondary | ICD-10-CM | POA: Insufficient documentation

## 2018-07-26 DIAGNOSIS — I2692 Saddle embolus of pulmonary artery without acute cor pulmonale: Secondary | ICD-10-CM

## 2018-07-26 DIAGNOSIS — Z86711 Personal history of pulmonary embolism: Secondary | ICD-10-CM

## 2018-07-26 DIAGNOSIS — Z86718 Personal history of other venous thrombosis and embolism: Secondary | ICD-10-CM | POA: Insufficient documentation

## 2018-07-26 DIAGNOSIS — Z87442 Personal history of urinary calculi: Secondary | ICD-10-CM | POA: Diagnosis not present

## 2018-07-26 DIAGNOSIS — E538 Deficiency of other specified B group vitamins: Secondary | ICD-10-CM

## 2018-07-26 DIAGNOSIS — I10 Essential (primary) hypertension: Secondary | ICD-10-CM

## 2018-07-26 DIAGNOSIS — N400 Enlarged prostate without lower urinary tract symptoms: Secondary | ICD-10-CM | POA: Insufficient documentation

## 2018-07-26 DIAGNOSIS — Z79899 Other long term (current) drug therapy: Secondary | ICD-10-CM | POA: Insufficient documentation

## 2018-07-26 DIAGNOSIS — R5383 Other fatigue: Secondary | ICD-10-CM | POA: Diagnosis not present

## 2018-07-26 DIAGNOSIS — D649 Anemia, unspecified: Secondary | ICD-10-CM

## 2018-07-26 LAB — COMPREHENSIVE METABOLIC PANEL
ALT: 6 U/L (ref 0–44)
AST: 14 U/L — ABNORMAL LOW (ref 15–41)
Albumin: 4.1 g/dL (ref 3.5–5.0)
Alkaline Phosphatase: 73 U/L (ref 38–126)
Anion gap: 9 (ref 5–15)
BUN: 30 mg/dL — ABNORMAL HIGH (ref 8–23)
CO2: 27 mmol/L (ref 22–32)
Calcium: 8.8 mg/dL — ABNORMAL LOW (ref 8.9–10.3)
Chloride: 105 mmol/L (ref 98–111)
Creatinine, Ser: 1.81 mg/dL — ABNORMAL HIGH (ref 0.61–1.24)
GFR calc Af Amer: 39 mL/min — ABNORMAL LOW (ref >60)
GFR calc non Af Amer: 33 mL/min — ABNORMAL LOW (ref >60)
Glucose, Bld: 105 mg/dL — ABNORMAL HIGH (ref 70–99)
Potassium: 4 mmol/L (ref 3.5–5.1)
Sodium: 141 mmol/L (ref 135–145)
Total Bilirubin: 1.2 mg/dL (ref 0.3–1.2)
Total Protein: 7.2 g/dL (ref 6.5–8.1)

## 2018-07-26 LAB — CBC WITH DIFFERENTIAL/PLATELET
Abs Immature Granulocytes: 0.01 10*3/uL (ref 0.00–0.07)
Basophils Absolute: 0 10*3/uL (ref 0.0–0.1)
Basophils Relative: 0 %
Eosinophils Absolute: 0.1 10*3/uL (ref 0.0–0.5)
Eosinophils Relative: 2 %
HCT: 40.3 % (ref 39.0–52.0)
Hemoglobin: 13.3 g/dL (ref 13.0–17.0)
Immature Granulocytes: 0 %
Lymphocytes Relative: 19 %
Lymphs Abs: 1.1 10*3/uL (ref 0.7–4.0)
MCH: 30.5 pg (ref 26.0–34.0)
MCHC: 33 g/dL (ref 30.0–36.0)
MCV: 92.4 fL (ref 80.0–100.0)
Monocytes Absolute: 0.6 10*3/uL (ref 0.1–1.0)
Monocytes Relative: 10 %
Neutro Abs: 4 10*3/uL (ref 1.7–7.7)
Neutrophils Relative %: 69 %
Platelets: 215 10*3/uL (ref 150–400)
RBC: 4.36 MIL/uL (ref 4.22–5.81)
RDW: 13.2 % (ref 11.5–15.5)
WBC: 5.9 10*3/uL (ref 4.0–10.5)
nRBC: 0 % (ref 0.0–0.2)

## 2018-07-26 MED ORDER — APIXABAN 2.5 MG PO TABS: 2.5000 mg | ORAL_TABLET | Freq: Two times a day (BID) | ORAL | 3 refills | Status: DC

## 2018-07-27 NOTE — Progress Notes (Signed)
Hematology/Oncology Consult note [facility]  Telephone:([phone] Fax:([phone]) [phone]  Patient Care Team: Venia Carbon, MD as PCP - General (Internal Medicine) Hollice Espy, MD as Consulting Physician (Urology) Nickie Retort, MD as Consulting Physician (Urology) Jonathon Bellows, MD as Consulting Physician (Surgery)   Name of the patient: Walter [name]  [id number]  [birth year]   Date of visit: 07/27/18  Diagnosis- h/o recurrent DVT  Chief complaint/ Reason for visit-routine follow-up of recurrent DVT and PE  Heme/Onc history: Patient is a 85 year old gentleman who was seen Dr. Mike Gip previously.  He has a history of left lower extremity DVT 1990 following arthroscopic surgery.  At that time he was on Coumadin for less than a year and then stopped it.  Several years later in January 2018 he developed bilateral PE after a 2-1/2-hour flight.  CTA at that time revealed moderate to large burden of PE in all lobes including a saddle embolus with no evidence of heart strain.  Short segment occlusive DVT was noted in the distal aspect of the right femoral vein.  He had a hypercoagulable work-up at that time which was unremarkable.  Patient has been on Eliquis since then  Interval history-he reports intermittent fatigue which is at his baseline.  He ambulates with a walker and continues to be independent of his ADLs.  ECOG PS- 2 Pain scale- 0 Opioid associated constipation- no  Review of systems- Review of Systems  Constitutional: Positive for malaise/fatigue. Negative for chills, fever and weight loss.  HENT: Negative for congestion, ear discharge and nosebleeds.   Eyes: Negative for blurred vision.  Respiratory: Negative for cough, hemoptysis, sputum production, shortness of breath and wheezing.   Cardiovascular: Negative for chest pain, palpitations, orthopnea and claudication.  Gastrointestinal: Negative for abdominal pain, blood in stool,  constipation, diarrhea, heartburn, melena, nausea and vomiting.  Genitourinary: Negative for dysuria, flank pain, frequency, hematuria and urgency.  Musculoskeletal: Negative for back pain, joint pain and myalgias.  Skin: Negative for rash.  Neurological: Negative for dizziness, tingling, focal weakness, seizures, weakness and headaches.  Endo/Heme/Allergies: Does not bruise/bleed easily.  Psychiatric/Behavioral: Negative for depression and suicidal ideas. The patient does not have insomnia.       Allergies  Allergen Reactions  . Quinolones     tendonitis     Past Medical History:  Diagnosis Date  . ARF (acute renal failure) ([place]) 2011   after TKR  . BPH (benign prostatic hypertrophy)   . DVT (deep venous thrombosis) ([place]) 1998   after left knee arthroscopy  . Hx of colonoscopy 2000  . Hypertension   . Hypothyroid 2007   postop from thyroidectomy (cancer scare)  . Kidney stones    recurrent  . Oral cancer ([place]) 2004   graft from left thigh  . Osteoarthritis of both knees   . Parkinson's disease ([place])   . Pulmonary embolism ([place])   . Tendonitis 2012   from quinolone  . Toe amputation status 2010     Past Surgical History:  Procedure Laterality Date  . APPENDECTOMY    . CHOLECYSTECTOMY    . CYSTOSCOPY/URETEROSCOPY/HOLMIUM LASER/STENT PLACEMENT Right 09/13/2017   Procedure: CYSTOSCOPY/URETEROSCOPY/HOLMIUM LASER/STENT PLACEMENT;  Surgeon: Hollice Espy, MD;  Location: [facility] ORS;  Service: Urology;  Laterality: Right;  . HERNIA REPAIR    . INTRAMEDULLARY (IM) NAIL INTERTROCHANTERIC Right 06/13/2017   Procedure: INTRAMEDULLARY (IM) NAIL INTERTROCHANTRIC;  Surgeon: Hessie Knows, MD;  Location: [facility] ORS;  Service: Orthopedics;  Laterality: Right;  . LITHOTRIPSY  2009/2010   then stent and cystoscopic removal 2014  . Oral cancer removed Right 2004  . REPAIR ZENKER'S DIVERTICULA  2013  . THYROIDECTOMY  2007  . TOE AMPUTATION Right 2010   badly overlapping other toe  .  TOTAL KNEE ARTHROPLASTY Left   . TRANSURETHRAL RESECTION OF PROSTATE  2011  . UMBILICAL HERNIA REPAIR  2007    Social History   Socioeconomic History  . Marital status: Married    Spouse name: Not on file  . Number of children: 3  . Years of education: Not on file  . Highest education level: Not on file  Occupational History  . 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 Financial resource strain: Not on file  . Food insecurity:    Worry: Not on file    Inability: Not on file  . Transportation needs:    Medical: Not on file    Non-medical: Not on file  Tobacco Use  . Smoking status: Former Smoker    Types: Cigars    Last attempt to quit: 04/27/2002    Years since quitting: 16.2  . Smokeless tobacco: Never Used  Substance and Sexual Activity  . Alcohol use: Not Currently    Alcohol/week: 0.0 standard drinks  . Drug use: No  . Sexual activity: Not Currently  Lifestyle  . Physical activity:    Days per week: Not on file    Minutes per session: Not on file  . Stress: Not on file  Relationships  . Social connections:    Talks on phone: Not on file    Gets together: Not on file    Attends religious service: Not on file    Active member of club or organization: Not on file    Attends meetings of clubs or organizations: Not on file    Relationship status: Not on file  . Intimate partner violence:    Fear of current or ex partner: Not on file    Emotionally abused: Not on file    Physically abused: Not on file    Forced sexual activity: Not on file  Other Topics Concern  . Not on file  Social History Narrative   Son works for [employer]   1 daughter in [place], other in [place]      Has living will   Wife, then son, would be health care POA   He isn't sure about DNR--will accept resuscitation for now   No tube feeds if cognitively unaware    Family History  Problem Relation Age of Onset  .  Dementia Mother   . Stroke Father   . Pulmonary disease Brother   . Heart disease Neg Hx   . Diabetes Neg Hx      Current Outpatient Medications:  .  acetaminophen (TYLENOL) 325 MG tablet, Take 2 tablets (650 mg total) by mouth every 6 (six) hours as needed for mild pain or moderate pain (or Fever >/= 101)., Disp: , Rfl:  .  amLODipine (NORVASC) 5 MG tablet, TAKE 1 TABLET EVERY DAY, Disp: 90 tablet, Rfl: 0 .  carbidopa-levodopa (SINEMET IR) 25-100 MG tablet, TAKE 1 TABLET THREE TIMES DAILY, Disp: 270 tablet, Rfl: 0 .  Cholecalciferol (VITAMIN D) 2000 UNITS CAPS, Take 2,000 Units by mouth daily. , Disp: , Rfl:  .  levothyroxine (SYNTHROID) 112 MCG tablet, TAKE 1 TABLET EVERY DAY BEFORE BREAKFAST, Disp: 90 tablet, Rfl: 0 .  polyethylene glycol (MIRALAX /  GLYCOLAX) packet, Take 17 g by mouth daily. , Disp: , Rfl:  .  vitamin B-12 (CYANOCOBALAMIN) 1000 MCG tablet, Take 1,000 mcg by mouth daily., Disp: , Rfl:  .  apixaban (ELIQUIS) 2.5 MG TABS tablet, Take 1 tablet (2.5 mg total) by mouth 2 (two) times daily., Disp: 180 tablet, Rfl: 3  Physical exam:  Vitals:   07/26/18 1354  BP: 125/77  Pulse: 76  Resp: 18  Temp: 97.7 F (36.5 C)  TempSrc: Tympanic  Weight: 172 lb 4.8 oz (78.2 kg)  Height: 5\' 10"  (1.778 m)   Physical Exam Constitutional:      Comments: Thin elderly gentleman who ambulates with a walker and has a slow shuffling gait  HENT:     Head: Normocephalic and atraumatic.  Eyes:     Pupils: Pupils are equal, round, and reactive to light.  Neck:     Musculoskeletal: Normal range of motion.  Cardiovascular:     Rate and Rhythm: Normal rate and regular rhythm.     Heart sounds: Normal heart sounds.  Pulmonary:     Effort: Pulmonary effort is normal.     Breath sounds: Normal breath sounds.  Abdominal:     General: Bowel sounds are normal.     Palpations: Abdomen is soft.  Musculoskeletal:     Comments: Bilateral lower extremity edema and changes of chronic  hyperpigmentation  Skin:    General: Skin is warm and dry.  Neurological:     Mental Status: He is alert and oriented to person, place, and time.      CMP Latest Ref Rng & Units 07/26/2018  Glucose 70 - 99 mg/dL 105(H)  BUN 8 - 23 mg/dL 30(H)  Creatinine 0.61 - 1.24 mg/dL 1.81(H)  Sodium 135 - 145 mmol/L 141  Potassium 3.5 - 5.1 mmol/L 4.0  Chloride 98 - 111 mmol/L 105  CO2 22 - 32 mmol/L 27  Calcium 8.9 - 10.3 mg/dL 8.8(L)  Total Protein 6.5 - 8.1 g/dL 7.2  Total Bilirubin 0.3 - 1.2 mg/dL 1.2  Alkaline Phos 38 - 126 U/L 73  AST 15 - 41 U/L 14(L)  ALT 0 - 44 U/L 6   CBC Latest Ref Rng & Units 07/26/2018  WBC 4.0 - 10.5 K/uL 5.9  Hemoglobin 13.0 - 17.0 g/dL 13.3  Hematocrit 39.0 - 52.0 % 40.3  Platelets 150 - 400 K/uL 215      Assessment and plan- Patient is a 85 y.o. male with 2 prior episodes of thromboembolism currently on Eliquis  Patient is currently on a lower dose of Eliquis 2.5 mg twice daily which I think would be reasonable. Given that he has not had another thrombotic episode for the last 2 years.  He also has moderate renal dysfunction.  He is already had a prior hypercoagulable work-up and given 2 episodes of thromboembolism including a massive PE in 2018 it would be reasonable for him to remain on lifelong anticoagulation.  Patient wishes to continue to follow-up with is at this time and I will see him back in 9 months with CBC, CMP ferritin and iron studies   Visit Diagnosis 1. History of pulmonary embolism   2. Chronic anticoagulation      Dr. Randa Evens, MD, MPH [facility] [phone] 07/27/2018 12:49 PM

## 2018-08-22 DIAGNOSIS — H2513 Age-related nuclear cataract, bilateral: Secondary | ICD-10-CM | POA: Diagnosis not present

## 2018-09-11 ENCOUNTER — Encounter: Payer: Self-pay | Admitting: *Deleted

## 2018-09-11 ENCOUNTER — Emergency Department: Payer: Medicare HMO

## 2018-09-11 ENCOUNTER — Other Ambulatory Visit: Payer: Self-pay

## 2018-09-11 ENCOUNTER — Emergency Department
Admission: EM | Admit: 2018-09-11 | Discharge: 2018-09-11 | Disposition: A | Discharge status: Home / Self Care | Payer: Medicare HMO | Attending: Emergency Medicine | Admitting: Emergency Medicine

## 2018-09-11 DIAGNOSIS — Z87891 Personal history of nicotine dependence: Secondary | ICD-10-CM | POA: Diagnosis not present

## 2018-09-11 DIAGNOSIS — N200 Calculus of kidney: Secondary | ICD-10-CM | POA: Diagnosis not present

## 2018-09-11 DIAGNOSIS — Z7901 Long term (current) use of anticoagulants: Secondary | ICD-10-CM | POA: Insufficient documentation

## 2018-09-11 DIAGNOSIS — Z79899 Other long term (current) drug therapy: Secondary | ICD-10-CM | POA: Diagnosis not present

## 2018-09-11 DIAGNOSIS — Z20828 Contact with and (suspected) exposure to other viral communicable diseases: Secondary | ICD-10-CM | POA: Insufficient documentation

## 2018-09-11 DIAGNOSIS — R509 Fever, unspecified: Secondary | ICD-10-CM | POA: Diagnosis not present

## 2018-09-11 DIAGNOSIS — R5381 Other malaise: Secondary | ICD-10-CM | POA: Diagnosis not present

## 2018-09-11 DIAGNOSIS — R531 Weakness: Secondary | ICD-10-CM

## 2018-09-11 DIAGNOSIS — N281 Cyst of kidney, acquired: Secondary | ICD-10-CM | POA: Diagnosis not present

## 2018-09-11 DIAGNOSIS — A419 Sepsis, unspecified organism: Secondary | ICD-10-CM | POA: Diagnosis not present

## 2018-09-11 DIAGNOSIS — I129 Hypertensive chronic kidney disease with stage 1 through stage 4 chronic kidney disease, or unspecified chronic kidney disease: Secondary | ICD-10-CM | POA: Diagnosis not present

## 2018-09-11 DIAGNOSIS — N183 Chronic kidney disease, stage 3 (moderate): Secondary | ICD-10-CM | POA: Diagnosis not present

## 2018-09-11 DIAGNOSIS — G20 Parkinson's disease: Secondary | ICD-10-CM | POA: Insufficient documentation

## 2018-09-11 DIAGNOSIS — E039 Hypothyroidism, unspecified: Secondary | ICD-10-CM | POA: Insufficient documentation

## 2018-09-11 LAB — CK: Total CK: 49 U/L (ref 49–397)

## 2018-09-11 LAB — COMPREHENSIVE METABOLIC PANEL
ALT: <5 U/L (ref 0–44)
AST: 13 U/L — ABNORMAL LOW (ref 15–41)
Albumin: 3.8 g/dL (ref 3.5–5.0)
Alkaline Phosphatase: 73 U/L (ref 38–126)
Anion gap: 8 (ref 5–15)
BUN: 29 mg/dL — ABNORMAL HIGH (ref 8–23)
CO2: 27 mmol/L (ref 22–32)
Calcium: 8.7 mg/dL — ABNORMAL LOW (ref 8.9–10.3)
Chloride: 107 mmol/L (ref 98–111)
Creatinine, Ser: 1.55 mg/dL — ABNORMAL HIGH (ref 0.61–1.24)
GFR calc Af Amer: 47 mL/min — ABNORMAL LOW (ref >60)
GFR calc non Af Amer: 40 mL/min — ABNORMAL LOW (ref >60)
Glucose, Bld: 105 mg/dL — ABNORMAL HIGH (ref 70–99)
Potassium: 3.9 mmol/L (ref 3.5–5.1)
Sodium: 142 mmol/L (ref 135–145)
Total Bilirubin: 1.4 mg/dL — ABNORMAL HIGH (ref 0.3–1.2)
Total Protein: 6.6 g/dL (ref 6.5–8.1)

## 2018-09-11 LAB — URINALYSIS, COMPLETE (UACMP) WITH MICROSCOPIC
Bacteria, UA: NONE SEEN
Bilirubin Urine: NEGATIVE
Glucose, UA: NEGATIVE mg/dL
Ketones, ur: 5 mg/dL — AB
Nitrite: NEGATIVE
Protein, ur: NEGATIVE mg/dL
Specific Gravity, Urine: 1.017 (ref 1.005–1.030)
Squamous Epithelial / HPF: NONE SEEN (ref 0–5)
pH: 6 (ref 5.0–8.0)

## 2018-09-11 LAB — CBC WITH DIFFERENTIAL/PLATELET
Abs Immature Granulocytes: 0.02 10*3/uL (ref 0.00–0.07)
Basophils Absolute: 0 10*3/uL (ref 0.0–0.1)
Basophils Relative: 0 %
Eosinophils Absolute: 0 10*3/uL (ref 0.0–0.5)
Eosinophils Relative: 0 %
HCT: 41.3 % (ref 39.0–52.0)
Hemoglobin: 13.6 g/dL (ref 13.0–17.0)
Immature Granulocytes: 0 %
Lymphocytes Relative: 6 %
Lymphs Abs: 0.4 10*3/uL — ABNORMAL LOW (ref 0.7–4.0)
MCH: 30.3 pg (ref 26.0–34.0)
MCHC: 32.9 g/dL (ref 30.0–36.0)
MCV: 92 fL (ref 80.0–100.0)
Monocytes Absolute: 0.6 10*3/uL (ref 0.1–1.0)
Monocytes Relative: 10 %
Neutro Abs: 5.3 10*3/uL (ref 1.7–7.7)
Neutrophils Relative %: 84 %
Platelets: 211 10*3/uL (ref 150–400)
RBC: 4.49 MIL/uL (ref 4.22–5.81)
RDW: 13.3 % (ref 11.5–15.5)
WBC: 6.4 10*3/uL (ref 4.0–10.5)
nRBC: 0 % (ref 0.0–0.2)

## 2018-09-11 LAB — SARS CORONAVIRUS 2 BY RT PCR (HOSPITAL ORDER, PERFORMED IN ~~LOC~~ HOSPITAL LAB): SARS Coronavirus 2: NEGATIVE

## 2018-09-11 LAB — LACTIC ACID, PLASMA: Lactic Acid, Venous: 0.8 mmol/L (ref 0.5–1.9)

## 2018-09-11 LAB — PROTIME-INR
INR: 1.2 (ref 0.8–1.2)
Prothrombin Time: 15.5 seconds — ABNORMAL HIGH (ref 11.4–15.2)

## 2018-09-11 LAB — APTT: aPTT: 34 seconds (ref 24–36)

## 2018-09-11 MED ORDER — SODIUM CHLORIDE 0.9% FLUSH: 3.0000 mL | Freq: Once | INTRAVENOUS | Status: DC

## 2018-09-11 MED ORDER — SODIUM CHLORIDE 0.9 % IV SOLN
1.0000 g | Freq: Once | INTRAVENOUS | Status: AC
  Administered 2018-09-11: 1 g via INTRAVENOUS
  Filled 2018-09-11: qty 1

## 2018-09-11 MED ORDER — ACETAMINOPHEN 500 MG PO TABS
1000.0000 mg | ORAL_TABLET | Freq: Once | ORAL | Status: AC
  Administered 2018-09-11: 22:00:00 1000 mg via ORAL
  Filled 2018-09-11: qty 2

## 2018-09-11 NOTE — ED Provider Notes (Signed)
[facility] Emergency Department Provider Note   ____________________________________________   First MD Initiated Contact with Patient 09/11/18 1925     (approximate)  I have reviewed the triage vital signs and the nursing notes.   HISTORY  Chief Complaint Code Sepsis    HPI Walter [name] is a 85 y.o. male who complains of fever 101 and feeling so weak he cannot walk.  He denies any coughing sneezing shortness of breath chest pain belly pain dysuria rashes or any other complaints.  Patient does have swelling in his legs but reports that is been there for a long time.         Past Medical History:  Diagnosis Date   ARF (acute renal failure) ([place]) 2011   after TKR   BPH (benign prostatic hypertrophy)    DVT (deep venous thrombosis) ([place]) 1998   after left knee arthroscopy   Hx of colonoscopy 2000   Hypertension    Hypothyroid 2007   postop from thyroidectomy (cancer scare)   Kidney stones    recurrent   Oral cancer ([place]) 2004   graft from left thigh   Osteoarthritis of both knees    Parkinson's disease ([place])    Pulmonary embolism ([place])    Tendonitis 2012   from quinolone   Toe amputation status 2010    Patient Active Problem List   Diagnosis Date Noted   RBD (REM behavioral disorder) 10/24/2017   Advance directive discussed with patient 11/01/2016   Renal cyst, right 06/06/2016   Acute deep vein thrombosis (DVT) of popliteal vein of right lower extremity ([place]) 04/28/2016   CKD (chronic kidney disease) stage 3, GFR 30-59 ml/min (HCC) 03/05/2016   Pulmonary embolism ([place]) 03/03/2016   Vitamin B12 deficiency 09/09/2014   Preventative health care 08/04/2014   Parkinson's disease ([place]) 08/04/2014   Esophageal diverticulum 01/01/2014   Actinic keratosis 07/31/2013   Hypertension    Osteoarthritis of both knees    Toe amputation status    Benign prostatic hyperplasia    Kidney stones    Hypothyroid     Tendonitis     Past Surgical History:  Procedure Laterality Date   APPENDECTOMY     CHOLECYSTECTOMY     CYSTOSCOPY/URETEROSCOPY/HOLMIUM LASER/STENT PLACEMENT Right 09/13/2017   Procedure: CYSTOSCOPY/URETEROSCOPY/HOLMIUM LASER/STENT PLACEMENT;  Surgeon: Hollice Espy, MD;  Location: [facility] ORS;  Service: Urology;  Laterality: Right;   HERNIA REPAIR     INTRAMEDULLARY (IM) NAIL INTERTROCHANTERIC Right 06/13/2017   Procedure: INTRAMEDULLARY (IM) NAIL INTERTROCHANTRIC;  Surgeon: Hessie Knows, MD;  Location: [facility] ORS;  Service: Orthopedics;  Laterality: Right;   LITHOTRIPSY  2009/2010   then stent and cystoscopic removal 2014   Oral cancer removed Right 2004   REPAIR ZENKER'S DIVERTICULA  2013   THYROIDECTOMY  2007   TOE AMPUTATION Right 2010   badly overlapping other toe   TOTAL KNEE ARTHROPLASTY Left    TRANSURETHRAL RESECTION OF PROSTATE  9030   UMBILICAL HERNIA REPAIR  2007    Prior to Admission medications   Medication Sig Start Date End Date Taking? Authorizing Provider  acetaminophen (TYLENOL) 325 MG tablet Take 2 tablets (650 mg total) by mouth every 6 (six) hours as needed for mild pain or moderate pain (or Fever >/= 101). 06/16/17  Yes Dustin Flock, MD  amLODipine (NORVASC) 5 MG tablet TAKE 1 TABLET EVERY DAY 07/26/18  Yes Venia Carbon, MD  apixaban (ELIQUIS) 2.5 MG TABS tablet Take 1 tablet (2.5 mg total) by mouth 2 (two)  times daily. 07/26/18  Yes Sindy Guadeloupe, MD  carbidopa-levodopa (SINEMET IR) 25-100 MG tablet TAKE 1 TABLET THREE TIMES DAILY 07/26/18  Yes Venia Carbon, MD  Cholecalciferol (VITAMIN D) 2000 UNITS CAPS Take 2,000 Units by mouth daily.    Yes [provider]  levothyroxine (SYNTHROID) 112 MCG tablet TAKE 1 TABLET EVERY DAY BEFORE BREAKFAST 07/26/18  Yes Viviana Simpler I, MD  polyethylene glycol (MIRALAX / GLYCOLAX) packet Take 17 g by mouth daily.    Yes [provider]  vitamin B-12 (CYANOCOBALAMIN) 1000 MCG tablet Take  1,000 mcg by mouth daily.   Yes [provider]    Allergies Quinolones  Family History  Problem Relation Age of Onset   Dementia Mother    Stroke Father    Pulmonary disease Brother    Heart disease Neg Hx    Diabetes Neg Hx     Social History Social History   Tobacco Use   Smoking status: Former Smoker    Types: Cigars    Quit date: 04/27/2002    Years since quitting: 16.3   Smokeless tobacco: Never Used  Substance Use Topics   Alcohol use: Not Currently    Alcohol/week: 0.0 standard drinks   Drug use: No    Review of Systems  Constitutional:  fever/chills Eyes: No visual changes. ENT: No sore throat. Cardiovascular: Denies chest pain. Respiratory: Denies shortness of breath. Gastrointestinal: No abdominal pain.  No nausea, no vomiting.  No diarrhea.  No constipation. Genitourinary: Negative for dysuria. Musculoskeletal: Negative for back pain. Skin: Negative for rash. Neurological: Negative for headaches, focal weakness   ____________________________________________   PHYSICAL EXAM:  VITAL SIGNS: ED Triage Vitals  Enc Vitals Group     BP 09/11/18 1926 136/70     Pulse Rate 09/11/18 1926 83     Resp 09/11/18 1926 15     Temp 09/11/18 1926 99.3 F (37.4 C)     Temp Source 09/11/18 1926 Oral     SpO2 09/11/18 1922 95 %     Weight 09/11/18 1927 170 lb (77.1 kg)     Height 09/11/18 1927 5\' 10"  (1.778 m)     Head Circumference --      Peak Flow --      Pain Score 09/11/18 1927 0     Pain Loc --      Pain Edu? --      Excl. in [place]? --    Constitutional: Alert and oriented. Well appearing and in no acute distress. Eyes: Conjunctivae are normal.  Head: Atraumatic. Nose: No congestion/rhinnorhea. Mouth/Throat: Mucous membranes are moist.  Oropharynx non-erythematous. Neck: No stridor.  Cardiovascular: Normal rate, regular rhythm. Grossly normal heart sounds.  Good peripheral circulation. Respiratory: Normal respiratory effort.  No  retractions. Lungs CTAB. Gastrointestinal: Soft and nontender. No distention. No abdominal bruits. No CVA tenderness. Musculoskeletal: No lower extremity tenderness bilateral 2+ edema. . Neurologic:  Normal speech and language. No gross focal neurologic deficits are appreciated. No gait instability. Skin:  Skin is warm, dry and intact. No rash noted.   ____________________________________________   LABS (all labs ordered are listed, but only abnormal results are displayed)  Labs Reviewed  CBC WITH DIFFERENTIAL/PLATELET - Abnormal; Notable for the following components:      Result Value   Lymphs Abs 0.4 (*)    All other components within normal limits  PROTIME-INR - Abnormal; Notable for the following components:   Prothrombin Time 15.5 (*)    All other components within  normal limits  SARS CORONAVIRUS 2 (HOSPITAL ORDER, [place] [facility])  CULTURE, BLOOD (ROUTINE X 2)  CULTURE, BLOOD (ROUTINE X 2)  URINE CULTURE  LACTIC ACID, PLASMA  APTT  LACTIC ACID, PLASMA  URINALYSIS, COMPLETE (UACMP) WITH MICROSCOPIC  CK  COMPREHENSIVE METABOLIC PANEL   ____________________________________________  EKG  EKG read and interpreted by me shows normal sinus rhythm rate of 83 left axis left bundle branch right bundle branch block and left anterior hemiblock ____________________________________________  RADIOLOGY  ED MD interpretation:  Official radiology report(s): Dg Chest Port 1 View  Result Date: 09/11/2018 CLINICAL DATA:  85 year old male with weakness and fever.  Sepsis. EXAM: PORTABLE CHEST 1 VIEW COMPARISON:  Chest radiograph 08/20/2017 and earlier. FINDINGS: Portable AP upright view at 1925 hours. Stable lung volumes and mediastinal contours. Cardiac size at the upper limits of normal. Visualized tracheal air column is within normal limits. No pneumothorax, pulmonary edema, pleural effusion or confluent pulmonary opacity. Mild prominence of the pulmonary  interstitium, but no convincing acute pulmonary opacity. Stable visible osseous structures. Stable asymmetric right supraclavicular soft tissue, perhaps a lipoma. IMPRESSION: No acute cardiopulmonary abnormality identified. Electronically Signed   By: Genevie Ann M.D.   On: 09/11/2018 19:45   Ct Renal Stone Study  Result Date: 09/11/2018 CLINICAL DATA:  Urinary sepsis. EXAM: CT ABDOMEN AND PELVIS WITHOUT CONTRAST TECHNIQUE: Multidetector CT imaging of the abdomen and pelvis was performed following the standard protocol without IV contrast. COMPARISON:  September 09, 2017 FINDINGS: Lower chest: 12 mm subpleural nodular thickening in the left lung base persists from the prior study. Areas of scarring in the right lower lobe and lingula are also stable. Hepatobiliary: No focal liver abnormality is seen. Status post cholecystectomy. No biliary dilatation. Pancreas: Unremarkable. No pancreatic ductal dilatation or surrounding inflammatory changes. Spleen: Normal in size without focal abnormality. Adrenals/Urinary Tract: Normal adrenal glands. Bilateral nonobstructive nephrolithiasis with the largest renal calculus in the lower pole of the left kidney measuring 6 mm. No evidence of hydronephrosis. 8 cm benign-appearing cyst off of the lower pole of the right kidney. Stomach/Bowel: Stomach is within normal limits. No evidence of bowel wall thickening, distention, or inflammatory changes. Vascular/Lymphatic: Aortic atherosclerosis. No enlarged abdominal or pelvic lymph nodes. Fusiform dilation of the proximal abdominal aorta measuring 3.5 cm. Fusiform dilation of the distal abdominal aorta measuring 3.0 cm. Reproductive: Enlargement of the prostate gland. Other: Fat containing left inguinal hernia. Musculoskeletal: Multilevel spondylosis of the spine. Prior IM fixation of the right femur. IMPRESSION: 1. No evidence of obstructive uropathy. 2. Bilateral nonobstructive nephrolithiasis. 3. 8 cm benign-appearing right renal cyst. 4.  Enlargement of the prostate gland. Please correlate to serum PSA values. 5. Fusiform dilation of the proximal abdominal aorta measuring up to 3.5 cm. Fusiform dilation of the distal abdominal aorta measuring 3.0 cm. Recommend followup by ultrasound in 2 years. This recommendation follows ACR consensus guidelines: White Paper of the ACR Incidental Findings Committee II on Vascular Findings. J Am Coll Radiol 2013; 10:789-794. Aortic aneurysm NOS (ICD10-I71.9) 6. Fat containing left inguinal hernia. Electronically Signed   By: Fidela Salisbury M.D.   On: 09/11/2018 20:05    ____________________________________________  PROCEDURES  Procedure(s) performed (including Critical Care):  Procedures   ____________________________________________   INITIAL IMPRESSION / ASSESSMENT AND PLAN / ED COURSE  Iaan Oregel was evaluated in Emergency Department on 09/11/2018 for the symptoms described in the history of present illness. He was evaluated in the context of the global COVID-19 pandemic, which necessitated  consideration that the patient might be at risk for infection with the SARS-CoV-2 virus that causes COVID-19. Institutional protocols and algorithms that pertain to the evaluation of patients at risk for COVID-19 are in a state of rapid change based on information released by regulatory bodies including the CDC and federal and state organizations. These policies and algorithms were followed during the patient's care in the ED.}    pt signed out to oncoming physician pending labs          ____________________________________________   FINAL CLINICAL IMPRESSION(S) / ED DIAGNOSES  Final diagnoses:  Fever, unspecified fever cause  Weakness     ED Discharge Orders    None       Note:  This document was prepared using Dragon voice recognition software and may include unintentional dictation errors.    Nena Polio, MD 09/11/18 2107

## 2018-09-11 NOTE — Progress Notes (Signed)
CODE SEPSIS - PHARMACY COMMUNICATION  **Broad Spectrum Antibiotics should be administered within 1 hour of Sepsis diagnosis**  Time Code Sepsis Called/Page Received: 1930  Antibiotics Ordered: cefepime  Time of 1st antibiotic administration: 2027  Additional action taken by pharmacy: NA  If necessary, Name of Provider/Nurse Contacted: NA    Tawnya Crook ,PharmD Clinical Pharmacist  09/11/2018  8:43 PM

## 2018-09-11 NOTE — Discharge Instructions (Addendum)
Please seek medical attention for any high fevers, chest pain, shortness of breath, change in behavior, persistent vomiting, bloody stool or any other new or concerning symptoms.  

## 2018-09-11 NOTE — ED Triage Notes (Signed)
Pt presents w/ c/o weakness, fever starting today. Pt unable to walk due to increased weakness.

## 2018-09-11 NOTE — ED Provider Notes (Signed)
Patient's blood work and imaging without obvious source of infection.  Patient did feel better during his stay here in the emergency department.  We did get him up and he was feeling stronger.  He felt comfortable going home and I think this is reasonable.  Discussed return precautions.   Nance Pear, MD 09/11/18 2303

## 2018-09-11 NOTE — ED Notes (Signed)
Wife and daughter called. This Probation officer gave update on patient.

## 2018-09-12 ENCOUNTER — Encounter: Payer: Self-pay | Admitting: Internal Medicine

## 2018-09-12 DIAGNOSIS — I714 Abdominal aortic aneurysm, without rupture, unspecified: Secondary | ICD-10-CM | POA: Insufficient documentation

## 2018-09-12 LAB — URINE CULTURE

## 2018-09-14 ENCOUNTER — Encounter: Payer: Self-pay | Admitting: Internal Medicine

## 2018-09-14 ENCOUNTER — Ambulatory Visit (INDEPENDENT_AMBULATORY_CARE_PROVIDER_SITE_OTHER): Payer: Medicare HMO | Admitting: Internal Medicine

## 2018-09-14 DIAGNOSIS — G20 Parkinson's disease: Secondary | ICD-10-CM

## 2018-09-14 DIAGNOSIS — T675XXD Heat exhaustion, unspecified, subsequent encounter: Secondary | ICD-10-CM | POA: Diagnosis not present

## 2018-09-14 DIAGNOSIS — T675XXA Heat exhaustion, unspecified, initial encounter: Secondary | ICD-10-CM | POA: Insufficient documentation

## 2018-09-14 NOTE — Progress Notes (Signed)
Subjective:    Patient ID: Walter [name], male    DOB: 1931-07-23, 85 y.o.   MRN: [id number]  HPI Virtual visit for ER follow up Identification done Billing reviewed and he gave consent He is at home with wife--and I am in my office  Had been out in the afternoon--it was very hot---2 days ago He didn't have the strength to get out of his car Neighbors helped with wheelchair Got inside but still not good Temp up to 102 So called rescue squad  Reviewed ER records Negative work up including CT abd, urine culture, CXR, labs Fever did go down  Feels a little better--but still weak Able to get around with his walker  Wife notes some decline from the Parkinson's even before this No dementia--but trouble even with pivoting to get into a wheelchair Considering getting aide help when they need to go out No longer able to wipe himself adequately--considering getting a bidet Already has aide for showers 3 days per week   Current Outpatient Medications on File Prior to Visit  Medication Sig Dispense Refill  . acetaminophen (TYLENOL) 325 MG tablet Take 2 tablets (650 mg total) by mouth every 6 (six) hours as needed for mild pain or moderate pain (or Fever >/= 101).    Marland Kitchen amLODipine (NORVASC) 5 MG tablet TAKE 1 TABLET EVERY DAY 90 tablet 0  . apixaban (ELIQUIS) 2.5 MG TABS tablet Take 1 tablet (2.5 mg total) by mouth 2 (two) times daily. 180 tablet 3  . carbidopa-levodopa (SINEMET IR) 25-100 MG tablet TAKE 1 TABLET THREE TIMES DAILY 270 tablet 0  . Cholecalciferol (VITAMIN D) 2000 UNITS CAPS Take 2,000 Units by mouth daily.     Marland Kitchen levothyroxine (SYNTHROID) 112 MCG tablet TAKE 1 TABLET EVERY DAY BEFORE BREAKFAST 90 tablet 0  . polyethylene glycol (MIRALAX / GLYCOLAX) packet Take 17 g by mouth daily.     . vitamin B-12 (CYANOCOBALAMIN) 1000 MCG tablet Take 1,000 mcg by mouth daily.     No current facility-administered medications on file prior to visit.     Allergies  Allergen Reactions  .  Quinolones     tendonitis    Past Medical History:  Diagnosis Date  . Abdominal aortic aneurysm (AAA) ([place])   . ARF (acute renal failure) ([place]) 2011   after TKR  . BPH (benign prostatic hypertrophy)   . DVT (deep venous thrombosis) ([place]) 1998   after left knee arthroscopy  . Hx of colonoscopy 2000  . Hypertension   . Hypothyroid 2007   postop from thyroidectomy (cancer scare)  . Kidney stones    recurrent  . Oral cancer ([place]) 2004   graft from left thigh  . Osteoarthritis of both knees   . Parkinson's disease ([place])   . Pulmonary embolism ([place])   . Tendonitis 2012   from quinolone  . Toe amputation status 2010    Past Surgical History:  Procedure Laterality Date  . APPENDECTOMY    . CHOLECYSTECTOMY    . CYSTOSCOPY/URETEROSCOPY/HOLMIUM LASER/STENT PLACEMENT Right 09/13/2017   Procedure: CYSTOSCOPY/URETEROSCOPY/HOLMIUM LASER/STENT PLACEMENT;  Surgeon: Hollice Espy, MD;  Location: [facility] ORS;  Service: Urology;  Laterality: Right;  . HERNIA REPAIR    . INTRAMEDULLARY (IM) NAIL INTERTROCHANTERIC Right 06/13/2017   Procedure: INTRAMEDULLARY (IM) NAIL INTERTROCHANTRIC;  Surgeon: Hessie Knows, MD;  Location: [facility] ORS;  Service: Orthopedics;  Laterality: Right;  . LITHOTRIPSY  2009/2010   then stent and cystoscopic removal 2014  . Oral cancer removed Right 2004  .  REPAIR ZENKER'S DIVERTICULA  2013  . THYROIDECTOMY  2007  . TOE AMPUTATION Right 2010   badly overlapping other toe  . TOTAL KNEE ARTHROPLASTY Left   . TRANSURETHRAL RESECTION OF PROSTATE  2011  . UMBILICAL HERNIA REPAIR  2007    Family History  Problem Relation Age of Onset  . Dementia Mother   . Stroke Father   . Pulmonary disease Brother   . Heart disease Neg Hx   . Diabetes Neg Hx     Social History   Socioeconomic History  . Marital status: Married    Spouse name: Not on file  . Number of children: 3  . Years of education: Not on file  . Highest education level: Not on file  Occupational History   . 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 Financial resource strain: Not on file  . Food insecurity    Worry: Not on file    Inability: Not on file  . Transportation needs    Medical: Not on file    Non-medical: Not on file  Tobacco Use  . Smoking status: Former Smoker    Types: Cigars    Quit date: 04/27/2002    Years since quitting: 16.3  . Smokeless tobacco: Never Used  Substance and Sexual Activity  . Alcohol use: Not Currently    Alcohol/week: 0.0 standard drinks  . Drug use: No  . Sexual activity: Not Currently  Lifestyle  . Physical activity    Days per week: Not on file    Minutes per session: Not on file  . Stress: Not on file  Relationships  . Social Herbalist on phone: Not on file    Gets together: Not on file    Attends religious service: Not on file    Active member of club or organization: Not on file    Attends meetings of clubs or organizations: Not on file    Relationship status: Not on file  . Intimate partner violence    Fear of current or ex partner: Not on file    Emotionally abused: Not on file    Physically abused: Not on file    Forced sexual activity: Not on file  Other Topics Concern  . Not on file  Social History Narrative   Son works for [employer]   1 daughter in [place], other in [place]      Has living will   Wife, then son, would be health care POA   He isn't sure about DNR--will accept resuscitation for now   No tube feeds if cognitively unaware    Review of Systems No cough or SOB No dysuria--but does have increased nocturia in the past day or so No N/V Eating and drinking fine No diarrhea---takes miralax daily No abdominal pain    Objective:   Physical Exam  Constitutional: He appears well-developed. No distress.  Respiratory: Effort normal. No respiratory distress.  Neurological:  Normal interaction Seems to be cognitively intact   Psychiatric: He has a normal mood and affect. His behavior is normal.           Assessment & Plan:

## 2018-09-14 NOTE — Assessment & Plan Note (Signed)
Thorough work up in ER negative Limited time out in the heat--but seems to have been enough to cause the fever and weakness Has improved but still not near previous baseline

## 2018-09-14 NOTE — Assessment & Plan Note (Signed)
Has had further functional decline Has aide for bathing--suggested they increase aide time to reduce chance he would need transfer to health care in the near future Discussed PT---would be hard for wife to get him there at this point---so will hold off Same meds

## 2018-09-16 LAB — CULTURE, BLOOD (ROUTINE X 2)
Culture: NO GROWTH
Culture: NO GROWTH
Special Requests: ADEQUATE
Special Requests: ADEQUATE

## 2018-10-02 ENCOUNTER — Ambulatory Visit (INDEPENDENT_AMBULATORY_CARE_PROVIDER_SITE_OTHER): Payer: Medicare HMO | Admitting: Internal Medicine

## 2018-10-02 ENCOUNTER — Encounter: Payer: Self-pay | Admitting: Internal Medicine

## 2018-10-02 ENCOUNTER — Other Ambulatory Visit: Payer: Self-pay

## 2018-10-02 DIAGNOSIS — R1310 Dysphagia, unspecified: Secondary | ICD-10-CM

## 2018-10-02 DIAGNOSIS — R1312 Dysphagia, oropharyngeal phase: Secondary | ICD-10-CM | POA: Diagnosis not present

## 2018-10-02 HISTORY — DX: Dysphagia, unspecified: R13.10

## 2018-10-02 NOTE — Progress Notes (Signed)
Subjective:    Patient ID: Walter [name], male    DOB: 09-15-1931, 85 y.o.   MRN: [id number]  HPI Here with wife due to swallowing trouble  Seems to have recovered from his heat episode Starting 2-3 days ago-- could chew and try to swallow, but it got stuck in upper throat and had to regurgitate it Has happened again since then and hasn't been able to eat Taking sips of liquids  No known injury or problem Did have a similar spell about 3 years ago Tries to stick to soft foods  Has been taking pills--but trouble swallowing the sinemet  No heartburn  Will have occasional drooling--if he leans over Zenker's surgery--"not totally successful" per wife  Current Outpatient Medications on File Prior to Visit  Medication Sig Dispense Refill  . acetaminophen (TYLENOL) 325 MG tablet Take 2 tablets (650 mg total) by mouth every 6 (six) hours as needed for mild pain or moderate pain (or Fever >/= 101).    Marland Kitchen amLODipine (NORVASC) 5 MG tablet TAKE 1 TABLET EVERY DAY 90 tablet 0  . apixaban (ELIQUIS) 2.5 MG TABS tablet Take 1 tablet (2.5 mg total) by mouth 2 (two) times daily. 180 tablet 3  . carbidopa-levodopa (SINEMET IR) 25-100 MG tablet TAKE 1 TABLET THREE TIMES DAILY 270 tablet 0  . Cholecalciferol (VITAMIN D) 2000 UNITS CAPS Take 2,000 Units by mouth daily.     Marland Kitchen levothyroxine (SYNTHROID) 112 MCG tablet TAKE 1 TABLET EVERY DAY BEFORE BREAKFAST 90 tablet 0  . polyethylene glycol (MIRALAX / GLYCOLAX) packet Take 17 g by mouth daily.     . vitamin B-12 (CYANOCOBALAMIN) 1000 MCG tablet Take 1,000 mcg by mouth daily.     No current facility-administered medications on file prior to visit.     Allergies  Allergen Reactions  . Quinolones     tendonitis    Past Medical History:  Diagnosis Date  . Abdominal aortic aneurysm (AAA) ([place])   . ARF (acute renal failure) ([place]) 2011   after TKR  . BPH (benign prostatic hypertrophy)   . DVT (deep venous thrombosis) ([place]) 1998   after left knee  arthroscopy  . Hx of colonoscopy 2000  . Hypertension   . Hypothyroid 2007   postop from thyroidectomy (cancer scare)  . Kidney stones    recurrent  . Oral cancer ([place]) 2004   graft from left thigh  . Osteoarthritis of both knees   . Parkinson's disease ([place])   . Pulmonary embolism ([place])   . Tendonitis 2012   from quinolone  . Toe amputation status 2010    Past Surgical History:  Procedure Laterality Date  . APPENDECTOMY    . CHOLECYSTECTOMY    . CYSTOSCOPY/URETEROSCOPY/HOLMIUM LASER/STENT PLACEMENT Right 09/13/2017   Procedure: CYSTOSCOPY/URETEROSCOPY/HOLMIUM LASER/STENT PLACEMENT;  Surgeon: Hollice Espy, MD;  Location: [facility] ORS;  Service: Urology;  Laterality: Right;  . HERNIA REPAIR    . INTRAMEDULLARY (IM) NAIL INTERTROCHANTERIC Right 06/13/2017   Procedure: INTRAMEDULLARY (IM) NAIL INTERTROCHANTRIC;  Surgeon: Hessie Knows, MD;  Location: [facility] ORS;  Service: Orthopedics;  Laterality: Right;  . LITHOTRIPSY  2009/2010   then stent and cystoscopic removal 2014  . Oral cancer removed Right 2004  . REPAIR ZENKER'S DIVERTICULA  2013  . THYROIDECTOMY  2007  . TOE AMPUTATION Right 2010   badly overlapping other toe  . TOTAL KNEE ARTHROPLASTY Left   . TRANSURETHRAL RESECTION OF PROSTATE  2011  . UMBILICAL HERNIA REPAIR  2007    Family History  Problem Relation Age of Onset  . Dementia Mother   . Stroke Father   . Pulmonary disease Brother   . Heart disease Neg Hx   . Diabetes Neg Hx     Social History   Socioeconomic History  . Marital status: Married    Spouse name: Not on file  . Number of children: 3  . Years of education: Not on file  . Highest education level: Not on file  Occupational History  . 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 Financial resource strain: Not on file  . Food insecurity    Worry: Not on file    Inability: Not on file  . Transportation needs    Medical:  Not on file    Non-medical: Not on file  Tobacco Use  . Smoking status: Former Smoker    Types: Cigars    Quit date: 04/27/2002    Years since quitting: 16.4  . Smokeless tobacco: Never Used  Substance and Sexual Activity  . Alcohol use: Not Currently    Alcohol/week: 0.0 standard drinks  . Drug use: No  . Sexual activity: Not Currently  Lifestyle  . Physical activity    Days per week: Not on file    Minutes per session: Not on file  . Stress: Not on file  Relationships  . Social Herbalist on phone: Not on file    Gets together: Not on file    Attends religious service: Not on file    Active member of club or organization: Not on file    Attends meetings of clubs or organizations: Not on file    Relationship status: Not on file  . Intimate partner violence    Fear of current or ex partner: Not on file    Emotionally abused: Not on file    Physically abused: Not on file    Forced sexual activity: Not on file  Other Topics Concern  . Not on file  Social History Narrative   Son works for [employer]   1 daughter in [place], other in [place] living will   Wife, then son, would be health care POA   He isn't sure about DNR--will accept resuscitation for now   No tube feeds if cognitively unaware   Review of Systems  Hasn't lost weight No abdominal pain No N/V Bowels are okay     Objective:   Physical Exam  Constitutional: He appears well-developed. No distress.  HENT:  No oral lesions Had intact gag and palate moves fairly normally  Neurological:  Marked stiffness and bradykinesia           Assessment & Plan:

## 2018-10-02 NOTE — Assessment & Plan Note (Signed)
Likely related to Parkinson's---but was sudden No symptoms to suggest acid reflux related damage--but that is a possibility Had past Zenker's repair---could be related to that Discussed boost --- 1 can tid and any soft foods he can tolerate ENT referral ASAP for direct visualization May need to consider GI as well

## 2018-10-03 ENCOUNTER — Other Ambulatory Visit: Payer: Self-pay | Admitting: Internal Medicine

## 2018-10-11 ENCOUNTER — Ambulatory Visit: Payer: Medicare HMO | Admitting: Neurology

## 2018-10-17 DIAGNOSIS — R1314 Dysphagia, pharyngoesophageal phase: Secondary | ICD-10-CM | POA: Diagnosis not present

## 2018-10-18 DIAGNOSIS — A0472 Enterocolitis due to Clostridium difficile, not specified as recurrent: Secondary | ICD-10-CM | POA: Diagnosis not present

## 2018-10-18 DIAGNOSIS — R197 Diarrhea, unspecified: Secondary | ICD-10-CM | POA: Diagnosis not present

## 2018-10-22 DIAGNOSIS — R197 Diarrhea, unspecified: Secondary | ICD-10-CM | POA: Diagnosis not present

## 2018-10-23 ENCOUNTER — Telehealth: Payer: Self-pay | Admitting: Internal Medicine

## 2018-10-23 NOTE — Telephone Encounter (Signed)
Spoke to pt's wife.   Pt started with diarrhea in July after being on the broad spectrum antibiotics in the hospital. Diarrhea kept getting worse. Wife took him to [facility] on 10-18-18 and they prescribed Vancomycin for possible C. Diff. His wife took the samples in yesterday and waiting for the results to come back. She is aware it will be tomorrow before I get back with her. Notes are in [place] for his recent visit at [facility].  She is asking what she can do for the diarrhea. Should he really stop the vancomycin since it was for the issue he is having?

## 2018-10-23 NOTE — Telephone Encounter (Signed)
Mrs Marchewka left v/m requesting cb with status of what to do about pt continuing vancomycin or stopping med.

## 2018-10-23 NOTE — Telephone Encounter (Signed)
Patient's wife,Carol,called.  Patient has had diarrhea for several days.  Patient has been taking Vancocin since 10/19/18.[name] said the medication hasn't worked.  Patient's able to eat and drink and he doesn't have a fever. Patient uses [facility]. Dr.Letvak doesn't have any appointments available for today. Please advise.

## 2018-10-23 NOTE — Telephone Encounter (Signed)
I assume the ENT started the vancocin? I have no notes. That could be causing the diarrhea---so he should stop that Try to get the notes from ENT Put him in --should be able to get him in by Thursday (and tomorrow at 12:30 if not already filled)

## 2018-10-24 NOTE — Telephone Encounter (Signed)
Spoke to pt's wife. She will try some fiber.

## 2018-10-24 NOTE — Telephone Encounter (Signed)
No Didn't know C diff specimens were pending He can try fiber and pepto bismol Should not use imodium, etc  May need to see GI

## 2018-11-09 ENCOUNTER — Emergency Department: Payer: Medicare HMO

## 2018-11-09 ENCOUNTER — Emergency Department
Admission: EM | Admit: 2018-11-09 | Discharge: 2018-11-09 | Disposition: A | Discharge status: Home / Self Care | Payer: Medicare HMO | Source: Home / Self Care | Attending: Emergency Medicine | Admitting: Emergency Medicine

## 2018-11-09 ENCOUNTER — Other Ambulatory Visit: Payer: Self-pay

## 2018-11-09 ENCOUNTER — Encounter: Payer: Self-pay | Admitting: Intensive Care

## 2018-11-09 DIAGNOSIS — R197 Diarrhea, unspecified: Secondary | ICD-10-CM | POA: Diagnosis not present

## 2018-11-09 DIAGNOSIS — S0101XA Laceration without foreign body of scalp, initial encounter: Secondary | ICD-10-CM

## 2018-11-09 DIAGNOSIS — Z20828 Contact with and (suspected) exposure to other viral communicable diseases: Secondary | ICD-10-CM | POA: Diagnosis not present

## 2018-11-09 DIAGNOSIS — Y9384 Activity, sleeping: Secondary | ICD-10-CM | POA: Insufficient documentation

## 2018-11-09 DIAGNOSIS — Z79899 Other long term (current) drug therapy: Secondary | ICD-10-CM | POA: Insufficient documentation

## 2018-11-09 DIAGNOSIS — D72828 Other elevated white blood cell count: Secondary | ICD-10-CM | POA: Diagnosis not present

## 2018-11-09 DIAGNOSIS — Z87891 Personal history of nicotine dependence: Secondary | ICD-10-CM | POA: Insufficient documentation

## 2018-11-09 DIAGNOSIS — Z85818 Personal history of malignant neoplasm of other sites of lip, oral cavity, and pharynx: Secondary | ICD-10-CM | POA: Insufficient documentation

## 2018-11-09 DIAGNOSIS — S0181XA Laceration without foreign body of other part of head, initial encounter: Secondary | ICD-10-CM | POA: Diagnosis not present

## 2018-11-09 DIAGNOSIS — Y999 Unspecified external cause status: Secondary | ICD-10-CM | POA: Insufficient documentation

## 2018-11-09 DIAGNOSIS — Y92003 Bedroom of unspecified non-institutional (private) residence as the place of occurrence of the external cause: Secondary | ICD-10-CM | POA: Insufficient documentation

## 2018-11-09 DIAGNOSIS — R1110 Vomiting, unspecified: Secondary | ICD-10-CM | POA: Diagnosis not present

## 2018-11-09 DIAGNOSIS — N179 Acute kidney failure, unspecified: Secondary | ICD-10-CM | POA: Diagnosis not present

## 2018-11-09 DIAGNOSIS — A0471 Enterocolitis due to Clostridium difficile, recurrent: Secondary | ICD-10-CM | POA: Diagnosis not present

## 2018-11-09 DIAGNOSIS — Z7901 Long term (current) use of anticoagulants: Secondary | ICD-10-CM | POA: Insufficient documentation

## 2018-11-09 DIAGNOSIS — E039 Hypothyroidism, unspecified: Secondary | ICD-10-CM | POA: Insufficient documentation

## 2018-11-09 DIAGNOSIS — N183 Chronic kidney disease, stage 3 (moderate): Secondary | ICD-10-CM | POA: Insufficient documentation

## 2018-11-09 DIAGNOSIS — Z96652 Presence of left artificial knee joint: Secondary | ICD-10-CM | POA: Insufficient documentation

## 2018-11-09 DIAGNOSIS — I129 Hypertensive chronic kidney disease with stage 1 through stage 4 chronic kidney disease, or unspecified chronic kidney disease: Secondary | ICD-10-CM | POA: Insufficient documentation

## 2018-11-09 DIAGNOSIS — R509 Fever, unspecified: Secondary | ICD-10-CM | POA: Diagnosis not present

## 2018-11-09 DIAGNOSIS — W06XXXA Fall from bed, initial encounter: Secondary | ICD-10-CM | POA: Insufficient documentation

## 2018-11-09 DIAGNOSIS — G20 Parkinson's disease: Secondary | ICD-10-CM | POA: Insufficient documentation

## 2018-11-09 MED ORDER — LIDOCAINE-EPINEPHRINE-TETRACAINE (LET) SOLUTION
3.0000 mL | Freq: Once | NASAL | Status: AC
  Administered 2018-11-09: 3 mL via TOPICAL
  Filled 2018-11-09: qty 3

## 2018-11-09 MED ORDER — ACETAMINOPHEN 325 MG PO TABS: 650.0000 mg | ORAL_TABLET | Freq: Once | ORAL | Status: DC

## 2018-11-09 MED ORDER — ACETAMINOPHEN 325 MG PO TABS
ORAL_TABLET | ORAL | Status: AC
  Filled 2018-11-09: qty 2

## 2018-11-09 NOTE — ED Triage Notes (Addendum)
Patient reports having a bad dream causing him to fall out of bed while sleeping. Patient hit head on dresser. Deep laceration noted on back of the right side of head. No drainage/bleeding at this time. Patient has parkinsons. Takes eliquis daily. A&O x4 in triage. Wife with patient

## 2018-11-09 NOTE — Discharge Instructions (Addendum)
[facility] may remove in 10 days by home care nurse or this facility.

## 2018-11-09 NOTE — ED Notes (Signed)
See triage note  Presents s/p fall  States he fell  Hit his head on dresser  Laceration noted to back of head  Bleeding controlled

## 2018-11-09 NOTE — ED Provider Notes (Signed)
[facility] Emergency Department Provider Note   ____________________________________________   First MD Initiated Contact with Patient 11/09/18 1208     (approximate)  I have reviewed the triage vital signs and the nursing notes.   HISTORY  Chief Complaint Fall and Head Laceration    HPI Walter [name] is a 85 y.o. male patient presents for laceration to the cervical area of the scalp which occurred this morning.  Patient had a bed frame causing a fall out of bed while sleeping.  Patient hit his head on the dresser.  Patient denies LOC.  There is no active bleeding per patient but is on Eliquis.  Patient also has a history of Parkinson.  Patient alert and orientated.  Patient denies vision disturbance or headache at this time.  Patient denies vertigo.  No pain complaint at this time.         Past Medical History:  Diagnosis Date  . Abdominal aortic aneurysm (AAA) ([place])   . ARF (acute renal failure) ([place]) 2011   after TKR  . BPH (benign prostatic hypertrophy)   . DVT (deep venous thrombosis) ([place]) 1998   after left knee arthroscopy  . Hx of colonoscopy 2000  . Hypertension   . Hypothyroid 2007   postop from thyroidectomy (cancer scare)  . Kidney stones    recurrent  . Oral cancer ([place]) 2004   graft from left thigh  . Osteoarthritis of both knees   . Parkinson's disease ([place])   . Pulmonary embolism ([place])   . Tendonitis 2012   from quinolone  . Toe amputation status 2010    Patient Active Problem List   Diagnosis Date Noted  . Oropharyngeal dysphagia 10/02/2018  . Heat exhaustion 09/14/2018  . Abdominal aortic aneurysm (AAA) ([place])   . RBD (REM behavioral disorder) 10/24/2017  . Advance directive discussed with patient 11/01/2016  . Renal cyst, right 06/06/2016  . Acute deep vein thrombosis (DVT) of popliteal vein of right lower extremity ([place]) 04/28/2016  . CKD (chronic kidney disease) stage 3, GFR 30-59 ml/min (HCC) 03/05/2016  .  Pulmonary embolism ([place]) 03/03/2016  . Vitamin B12 deficiency 09/09/2014  . Preventative health care 08/04/2014  . Parkinson's disease ([place]) 08/04/2014  . Esophageal diverticulum 01/01/2014  . Actinic keratosis 07/31/2013  . Hypertension   . Osteoarthritis of both knees   . Toe amputation status   . Benign prostatic hyperplasia   . Kidney stones   . Hypothyroid   . Tendonitis     Past Surgical History:  Procedure Laterality Date  . APPENDECTOMY    . CHOLECYSTECTOMY    . CYSTOSCOPY/URETEROSCOPY/HOLMIUM LASER/STENT PLACEMENT Right 09/13/2017   Procedure: CYSTOSCOPY/URETEROSCOPY/HOLMIUM LASER/STENT PLACEMENT;  Surgeon: Hollice Espy, MD;  Location: [facility] ORS;  Service: Urology;  Laterality: Right;  . HERNIA REPAIR    . INTRAMEDULLARY (IM) NAIL INTERTROCHANTERIC Right 06/13/2017   Procedure: INTRAMEDULLARY (IM) NAIL INTERTROCHANTRIC;  Surgeon: Hessie Knows, MD;  Location: [facility] ORS;  Service: Orthopedics;  Laterality: Right;  . LITHOTRIPSY  2009/2010   then stent and cystoscopic removal 2014  . Oral cancer removed Right 2004  . REPAIR ZENKER'S DIVERTICULA  2013  . THYROIDECTOMY  2007  . TOE AMPUTATION Right 2010   badly overlapping other toe  . TOTAL KNEE ARTHROPLASTY Left   . TRANSURETHRAL RESECTION OF PROSTATE  2011  . UMBILICAL HERNIA REPAIR  2007    Prior to Admission medications   Medication Sig Start Date End Date Taking? Authorizing Provider  acetaminophen (TYLENOL) 325 MG  tablet Take 2 tablets (650 mg total) by mouth every 6 (six) hours as needed for mild pain or moderate pain (or Fever >/= 101). 06/16/17   Dustin Flock, MD  amLODipine (NORVASC) 5 MG tablet TAKE 1 TABLET EVERY DAY 10/03/18   Venia Carbon, MD  apixaban (ELIQUIS) 2.5 MG TABS tablet Take 1 tablet (2.5 mg total) by mouth 2 (two) times daily. 07/26/18   Sindy Guadeloupe, MD  carbidopa-levodopa (SINEMET IR) 25-100 MG tablet TAKE 1 TABLET THREE TIMES DAILY 10/03/18   Venia Carbon, MD  Cholecalciferol  (VITAMIN D) 2000 UNITS CAPS Take 2,000 Units by mouth daily.     [provider]  levothyroxine (SYNTHROID) 112 MCG tablet TAKE 1 TABLET EVERY DAY BEFORE BREAKFAST 10/03/18   Viviana Simpler I, MD  polyethylene glycol (MIRALAX / GLYCOLAX) packet Take 17 g by mouth daily.     [provider]  vitamin B-12 (CYANOCOBALAMIN) 1000 MCG tablet Take 1,000 mcg by mouth daily.    [provider]    Allergies Quinolones  Family History  Problem Relation Age of Onset  . Dementia Mother   . Stroke Father   . Pulmonary disease Brother   . Heart disease Neg Hx   . Diabetes Neg Hx     Social History Social History   Tobacco Use  . Smoking status: Former Smoker    Types: Cigars    Quit date: 04/27/2002    Years since quitting: 16.5  . Smokeless tobacco: Never Used  Substance Use Topics  . Alcohol use: Not Currently    Alcohol/week: 0.0 standard drinks  . Drug use: No    Review of Systems  Constitutional: No fever/chills Eyes: No visual changes. ENT: No sore throat. Cardiovascular: Denies chest pain. Respiratory: Denies shortness of breath. Gastrointestinal: No abdominal pain.  No nausea, no vomiting.  No diarrhea.  No constipation. Genitourinary: Negative for dysuria. Musculoskeletal: Negative for back pain. Skin: Negative for rash.  Scalp laceration Neurological: Negative for headaches, focal weakness or numbness.  Parkinson Endocrine:  Hypertension hypothyroidism. Allergic/Immunilogical: Quinolones ____________________________________________   PHYSICAL EXAM:  VITAL SIGNS: ED Triage Vitals  Enc Vitals Group     BP 11/09/18 1152 111/63     Pulse Rate 11/09/18 1152 76     Resp --      Temp 11/09/18 1152 98.3 F (36.8 C)     Temp Source 11/09/18 1152 Oral     SpO2 11/09/18 1152 98 %     Weight 11/09/18 1152 165 lb (74.8 kg)     Height 11/09/18 1152 5\' 10"  (1.778 m)     Head Circumference --      Peak Flow --      Pain Score 11/09/18 1158 0      Pain Loc --      Pain Edu? --      Excl. in [place]? --    Constitutional: Alert and oriented. Well appearing and in no acute distress. Eyes: Conjunctivae are normal. PERRL. EOMI. Head: Atraumatic.  Scalp laceration Neck: No cervical spine tenderness to palpation. Hematological/Lymphatic/Immunilogical: No cervical lymphadenopathy. Cardiovascular: Normal rate, regular rhythm. Grossly normal heart sounds.  Good peripheral circulation. Respiratory: Normal respiratory effort.  No retractions. Lungs CTAB. Neurologic:  Normal speech and language. No gross focal neurologic deficits are appreciated. No gait instability. Skin: 2.5 cm laceration of occipital scalp Psychiatric: Mood and affect are normal. Speech and behavior are normal.  ____________________________________________   LABS (all labs ordered are listed, but only abnormal  results are displayed)  Labs Reviewed - No data to display ____________________________________________  EKG   ____________________________________________  RADIOLOGY  ED MD interpretation:    Official radiology report(s): Ct Head Wo Contrast  Result Date: 11/09/2018 CLINICAL DATA:  Patient fell out of bed while sleeping. Hit head on dresser. Laceration back of right head. EXAM: CT HEAD WITHOUT CONTRAST TECHNIQUE: Contiguous axial images were obtained from the base of the skull through the vertex without intravenous contrast. COMPARISON:  None. FINDINGS: Brain: There is no evidence for acute hemorrhage, hydrocephalus, mass lesion, or abnormal extra-axial fluid collection. No definite CT evidence for acute infarction. Diffuse loss of parenchymal volume is consistent with atrophy. Patchy low attenuation in the deep hemispheric and periventricular white matter is nonspecific, but likely reflects chronic microvascular ischemic demyelination. Vascular: No hyperdense vessel or unexpected calcification. Skull: No evidence for fracture. No worrisome lytic or sclerotic  lesion. Sinuses/Orbits: The visualized paranasal sinuses and mastoid air cells are clear. Visualized portions of the globes and intraorbital fat are unremarkable. Other: None. IMPRESSION: 1. No acute intracranial abnormality. 2. Atrophy with chronic small vessel white matter ischemic disease. Electronically Signed   By: Misty Rashaan M.D.   On: 11/09/2018 13:36    ____________________________________________   PROCEDURES  Procedure(s) performed (including Critical Care):  Marland KitchenMarland KitchenLaceration Repair  Date/Time: 11/09/2018 1:50 PM Performed by: Sable Feil, PA-C Authorized by: Sable Feil, PA-C   Consent:    Consent obtained:  Verbal   Consent given by:  Patient   Risks discussed:  Infection, pain and poor cosmetic result Anesthesia (see MAR for exact dosages):    Anesthesia method:  Topical application   Topical anesthetic:  LET Laceration details:    Location:  Scalp   Scalp location:  Occipital   Length (cm):  2.5 Repair type:    Repair type:  Simple Pre-procedure details:    Preparation:  Patient was prepped and draped in usual sterile fashion Exploration:    Contaminated: no   Treatment:    Area cleansed with:  Betadine and saline   Amount of cleaning:  Standard Skin repair:    Repair method:  Staples   Number of staples:  5 Approximation:    Approximation:  Close Post-procedure details:    Dressing:  Open (no dressing)   Patient tolerance of procedure:  Tolerated well, no immediate complications     ____________________________________________   INITIAL IMPRESSION / ASSESSMENT AND PLAN / ED COURSE  As part of my medical decision making, I reviewed the following data within the [place] was evaluated in Emergency Department on 11/09/2018 for the symptoms described in the history of present illness. He was evaluated in the context of the global COVID-19 pandemic, which necessitated consideration that the patient might  be at risk for infection with the SARS-CoV-2 virus that causes COVID-19. Institutional protocols and algorithms that pertain to the evaluation of patients at risk for COVID-19 are in a state of rapid change based on information released by regulatory bodies including the CDC and federal and state organizations. These policies and algorithms were followed during the patient's care in the ED.  Scalp laceration secondary to fall.  Discussed CT report with patient.  See procedure note for skin closure.  Patient advised to have staples removed in 10 days by home care nurse or this department.      ____________________________________________   FINAL CLINICAL IMPRESSION(S) / ED DIAGNOSES  Final diagnoses:  Laceration of scalp, initial encounter     ED Discharge Orders    None       Note:  This document was prepared using Dragon voice recognition software and may include unintentional dictation errors.    Sable Feil, PA-C 11/09/18 1351    Harvest Dark, MD 11/09/18 1507

## 2018-11-10 ENCOUNTER — Emergency Department: Payer: Medicare HMO

## 2018-11-10 ENCOUNTER — Encounter: Payer: Self-pay | Admitting: Emergency Medicine

## 2018-11-10 ENCOUNTER — Inpatient Hospital Stay
Admission: EM | Admit: 2018-11-10 | Discharge: 2018-11-13 | DRG: 372 | Disposition: A | Discharge status: Skilled Nursing Facility | Payer: Medicare HMO | Attending: Internal Medicine | Admitting: Internal Medicine

## 2018-11-10 ENCOUNTER — Other Ambulatory Visit: Payer: Self-pay

## 2018-11-10 DIAGNOSIS — Z86718 Personal history of other venous thrombosis and embolism: Secondary | ICD-10-CM

## 2018-11-10 DIAGNOSIS — R509 Fever, unspecified: Secondary | ICD-10-CM

## 2018-11-10 DIAGNOSIS — Z96652 Presence of left artificial knee joint: Secondary | ICD-10-CM | POA: Diagnosis present

## 2018-11-10 DIAGNOSIS — G20 Parkinson's disease: Secondary | ICD-10-CM | POA: Diagnosis present

## 2018-11-10 DIAGNOSIS — Z881 Allergy status to other antibiotic agents status: Secondary | ICD-10-CM

## 2018-11-10 DIAGNOSIS — D72828 Other elevated white blood cell count: Secondary | ICD-10-CM

## 2018-11-10 DIAGNOSIS — E890 Postprocedural hypothyroidism: Secondary | ICD-10-CM | POA: Diagnosis present

## 2018-11-10 DIAGNOSIS — Z86711 Personal history of pulmonary embolism: Secondary | ICD-10-CM

## 2018-11-10 DIAGNOSIS — K529 Noninfective gastroenteritis and colitis, unspecified: Secondary | ICD-10-CM | POA: Diagnosis present

## 2018-11-10 DIAGNOSIS — Z89421 Acquired absence of other right toe(s): Secondary | ICD-10-CM

## 2018-11-10 DIAGNOSIS — Z20828 Contact with and (suspected) exposure to other viral communicable diseases: Secondary | ICD-10-CM | POA: Diagnosis not present

## 2018-11-10 DIAGNOSIS — A0471 Enterocolitis due to Clostridium difficile, recurrent: Principal | ICD-10-CM | POA: Diagnosis present

## 2018-11-10 DIAGNOSIS — N179 Acute kidney failure, unspecified: Secondary | ICD-10-CM | POA: Diagnosis not present

## 2018-11-10 DIAGNOSIS — Z87891 Personal history of nicotine dependence: Secondary | ICD-10-CM

## 2018-11-10 DIAGNOSIS — E860 Dehydration: Secondary | ICD-10-CM | POA: Diagnosis present

## 2018-11-10 DIAGNOSIS — R5381 Other malaise: Secondary | ICD-10-CM | POA: Diagnosis present

## 2018-11-10 DIAGNOSIS — R0902 Hypoxemia: Secondary | ICD-10-CM | POA: Diagnosis not present

## 2018-11-10 DIAGNOSIS — L8990 Pressure ulcer of unspecified site, unspecified stage: Secondary | ICD-10-CM | POA: Insufficient documentation

## 2018-11-10 DIAGNOSIS — Z888 Allergy status to other drugs, medicaments and biological substances status: Secondary | ICD-10-CM

## 2018-11-10 DIAGNOSIS — Z9049 Acquired absence of other specified parts of digestive tract: Secondary | ICD-10-CM

## 2018-11-10 DIAGNOSIS — I129 Hypertensive chronic kidney disease with stage 1 through stage 4 chronic kidney disease, or unspecified chronic kidney disease: Secondary | ICD-10-CM | POA: Diagnosis present

## 2018-11-10 DIAGNOSIS — Z79899 Other long term (current) drug therapy: Secondary | ICD-10-CM

## 2018-11-10 DIAGNOSIS — R1110 Vomiting, unspecified: Secondary | ICD-10-CM | POA: Diagnosis not present

## 2018-11-10 DIAGNOSIS — R1312 Dysphagia, oropharyngeal phase: Secondary | ICD-10-CM | POA: Diagnosis present

## 2018-11-10 DIAGNOSIS — Z9079 Acquired absence of other genital organ(s): Secondary | ICD-10-CM

## 2018-11-10 DIAGNOSIS — W19XXXA Unspecified fall, initial encounter: Secondary | ICD-10-CM | POA: Diagnosis not present

## 2018-11-10 DIAGNOSIS — Z87442 Personal history of urinary calculi: Secondary | ICD-10-CM

## 2018-11-10 DIAGNOSIS — S0101XA Laceration without foreign body of scalp, initial encounter: Secondary | ICD-10-CM | POA: Diagnosis present

## 2018-11-10 DIAGNOSIS — R197 Diarrhea, unspecified: Secondary | ICD-10-CM

## 2018-11-10 DIAGNOSIS — E876 Hypokalemia: Secondary | ICD-10-CM | POA: Diagnosis present

## 2018-11-10 DIAGNOSIS — N400 Enlarged prostate without lower urinary tract symptoms: Secondary | ICD-10-CM | POA: Diagnosis present

## 2018-11-10 DIAGNOSIS — N183 Chronic kidney disease, stage 3 (moderate): Secondary | ICD-10-CM | POA: Diagnosis present

## 2018-11-10 DIAGNOSIS — Z79890 Hormone replacement therapy (postmenopausal): Secondary | ICD-10-CM

## 2018-11-10 DIAGNOSIS — W06XXXA Fall from bed, initial encounter: Secondary | ICD-10-CM | POA: Diagnosis present

## 2018-11-10 DIAGNOSIS — Z7901 Long term (current) use of anticoagulants: Secondary | ICD-10-CM

## 2018-11-10 DIAGNOSIS — I714 Abdominal aortic aneurysm, without rupture: Secondary | ICD-10-CM | POA: Diagnosis present

## 2018-11-10 LAB — CBC WITH DIFFERENTIAL/PLATELET
Abs Immature Granulocytes: 0.08 10*3/uL — ABNORMAL HIGH (ref 0.00–0.07)
Basophils Absolute: 0 10*3/uL (ref 0.0–0.1)
Basophils Relative: 0 %
Eosinophils Absolute: 0 10*3/uL (ref 0.0–0.5)
Eosinophils Relative: 0 %
HCT: 39 % (ref 39.0–52.0)
Hemoglobin: 12.8 g/dL — ABNORMAL LOW (ref 13.0–17.0)
Immature Granulocytes: 1 %
Lymphocytes Relative: 5 %
Lymphs Abs: 0.7 10*3/uL (ref 0.7–4.0)
MCH: 30.5 pg (ref 26.0–34.0)
MCHC: 32.8 g/dL (ref 30.0–36.0)
MCV: 93.1 fL (ref 80.0–100.0)
Monocytes Absolute: 1.4 10*3/uL — ABNORMAL HIGH (ref 0.1–1.0)
Monocytes Relative: 10 %
Neutro Abs: 11.8 10*3/uL — ABNORMAL HIGH (ref 1.7–7.7)
Neutrophils Relative %: 84 %
Platelets: 194 10*3/uL (ref 150–400)
RBC: 4.19 MIL/uL — ABNORMAL LOW (ref 4.22–5.81)
RDW: 13.3 % (ref 11.5–15.5)
WBC: 14 10*3/uL — ABNORMAL HIGH (ref 4.0–10.5)
nRBC: 0 % (ref 0.0–0.2)

## 2018-11-10 LAB — COMPREHENSIVE METABOLIC PANEL
ALT: 5 U/L (ref 0–44)
AST: 13 U/L — ABNORMAL LOW (ref 15–41)
Albumin: 3.8 g/dL (ref 3.5–5.0)
Alkaline Phosphatase: 76 U/L (ref 38–126)
Anion gap: 11 (ref 5–15)
BUN: 30 mg/dL — ABNORMAL HIGH (ref 8–23)
CO2: 26 mmol/L (ref 22–32)
Calcium: 8.9 mg/dL (ref 8.9–10.3)
Chloride: 101 mmol/L (ref 98–111)
Creatinine, Ser: 1.86 mg/dL — ABNORMAL HIGH (ref 0.61–1.24)
GFR calc Af Amer: 37 mL/min — ABNORMAL LOW (ref >60)
GFR calc non Af Amer: 32 mL/min — ABNORMAL LOW (ref >60)
Glucose, Bld: 106 mg/dL — ABNORMAL HIGH (ref 70–99)
Potassium: 3.7 mmol/L (ref 3.5–5.1)
Sodium: 138 mmol/L (ref 135–145)
Total Bilirubin: 1.4 mg/dL — ABNORMAL HIGH (ref 0.3–1.2)
Total Protein: 6.6 g/dL (ref 6.5–8.1)

## 2018-11-10 LAB — LACTIC ACID, PLASMA: Lactic Acid, Venous: 1 mmol/L (ref 0.5–1.9)

## 2018-11-10 LAB — PROTIME-INR
INR: 1.4 — ABNORMAL HIGH (ref 0.8–1.2)
Prothrombin Time: 16.7 seconds — ABNORMAL HIGH (ref 11.4–15.2)

## 2018-11-10 LAB — SARS CORONAVIRUS 2 BY RT PCR (HOSPITAL ORDER, PERFORMED IN ~~LOC~~ HOSPITAL LAB): SARS Coronavirus 2: NEGATIVE

## 2018-11-10 LAB — URINALYSIS, COMPLETE (UACMP) WITH MICROSCOPIC
Bilirubin Urine: NEGATIVE
Glucose, UA: NEGATIVE mg/dL
Ketones, ur: 5 mg/dL — AB
Leukocytes,Ua: NEGATIVE
Nitrite: NEGATIVE
Protein, ur: 30 mg/dL — AB
Specific Gravity, Urine: 1.025 (ref 1.005–1.030)
pH: 5 (ref 5.0–8.0)

## 2018-11-10 LAB — LIPASE, BLOOD: Lipase: 30 U/L (ref 11–51)

## 2018-11-10 LAB — APTT: aPTT: 38 seconds — ABNORMAL HIGH (ref 24–36)

## 2018-11-10 MED ORDER — IOHEXOL 300 MG/ML  SOLN
75.0000 mL | Freq: Once | INTRAMUSCULAR | Status: AC | PRN
  Administered 2018-11-10: 75 mL via INTRAVENOUS

## 2018-11-10 MED ORDER — SODIUM CHLORIDE 0.9 % IV BOLUS
1000.0000 mL | Freq: Once | INTRAVENOUS | Status: AC
  Administered 2018-11-10: 1000 mL via INTRAVENOUS

## 2018-11-10 MED ORDER — IOHEXOL 9 MG/ML PO SOLN
500.0000 mL | Freq: Two times a day (BID) | ORAL | Status: DC | PRN
  Administered 2018-11-10 (×2): 500 mL via ORAL
  Filled 2018-11-10 (×2): qty 500

## 2018-11-10 MED ORDER — SODIUM CHLORIDE 0.9 % IV SOLN
2.0000 g | Freq: Once | INTRAVENOUS | Status: AC
  Administered 2018-11-10: 2 g via INTRAVENOUS
  Filled 2018-11-10: qty 20

## 2018-11-10 MED ORDER — METRONIDAZOLE IN NACL 5-0.79 MG/ML-% IV SOLN
500.0000 mg | Freq: Once | INTRAVENOUS | Status: AC
  Administered 2018-11-10: 500 mg via INTRAVENOUS
  Filled 2018-11-10: qty 100

## 2018-11-10 MED ORDER — SODIUM CHLORIDE 0.9 % IV BOLUS
1000.0000 mL | Freq: Once | INTRAVENOUS | Status: AC
  Administered 2018-11-10: 22:00:00 1000 mL via INTRAVENOUS

## 2018-11-10 NOTE — ED Notes (Signed)
Second IV attempted by this RN x 2.

## 2018-11-10 NOTE — ED Provider Notes (Signed)
[facility] Emergency Department Provider Note  ____________________________________________   First MD Initiated Contact with Patient 11/10/18 2037     (approximate)  I have reviewed the triage vital signs and the nursing notes.   HISTORY  Chief Complaint Diarrhea    HPI Walter [name] is a 85 y.o. male with past medical history as below including improper cath stents, renal failure, history of DVT, here with diarrhea and abdominal discomfort.  The patient states that for the last week, he has had persistent, watery, diarrhea.  It is been foul-smelling.  He is had minimal diffuse abdominal cramping when he has diarrhea but no overt persistent abdominal pain.  He does not recall having fever but was febrile here.  Has had some mild chills.  Said decreased appetite.  He is felt generally weak.  He does not notice any particular alleviating or aggravating factors.       Past Medical History:  Diagnosis Date   Abdominal aortic aneurysm (AAA) ([place])    ARF (acute renal failure) ([place]) 2011   after TKR   BPH (benign prostatic hypertrophy)    DVT (deep venous thrombosis) ([place]) 1998   after left knee arthroscopy   Hx of colonoscopy 2000   Hypertension    Hypothyroid 2007   postop from thyroidectomy (cancer scare)   Kidney stones    recurrent   Oral cancer ([place]) 2004   graft from left thigh   Osteoarthritis of both knees    Parkinson's disease ([place])    Pulmonary embolism ([place])    Tendonitis 2012   from quinolone   Toe amputation status 2010    Patient Active Problem List   Diagnosis Date Noted   Oropharyngeal dysphagia 10/02/2018   Heat exhaustion 09/14/2018   Abdominal aortic aneurysm (AAA) ([place])    RBD (REM behavioral disorder) 10/24/2017   Advance directive discussed with patient 11/01/2016   Renal cyst, right 06/06/2016   Acute deep vein thrombosis (DVT) of popliteal vein of right lower extremity ([place]) 04/28/2016   CKD  (chronic kidney disease) stage 3, GFR 30-59 ml/min (HCC) 03/05/2016   Pulmonary embolism ([place]) 03/03/2016   Vitamin B12 deficiency 09/09/2014   Preventative health care 08/04/2014   Parkinson's disease ([place]) 08/04/2014   Esophageal diverticulum 01/01/2014   Actinic keratosis 07/31/2013   Hypertension    Osteoarthritis of both knees    Toe amputation status    Benign prostatic hyperplasia    Kidney stones    Hypothyroid    Tendonitis     Past Surgical History:  Procedure Laterality Date   APPENDECTOMY     CHOLECYSTECTOMY     CYSTOSCOPY/URETEROSCOPY/HOLMIUM LASER/STENT PLACEMENT Right 09/13/2017   Procedure: CYSTOSCOPY/URETEROSCOPY/HOLMIUM LASER/STENT PLACEMENT;  Surgeon: Hollice Espy, MD;  Location: [facility] ORS;  Service: Urology;  Laterality: Right;   HERNIA REPAIR     INTRAMEDULLARY (IM) NAIL INTERTROCHANTERIC Right 06/13/2017   Procedure: INTRAMEDULLARY (IM) NAIL INTERTROCHANTRIC;  Surgeon: Hessie Knows, MD;  Location: [facility] ORS;  Service: Orthopedics;  Laterality: Right;   LITHOTRIPSY  2009/2010   then stent and cystoscopic removal 2014   Oral cancer removed Right 2004   REPAIR ZENKER'S DIVERTICULA  2013   THYROIDECTOMY  2007   TOE AMPUTATION Right 2010   badly overlapping other toe   TOTAL KNEE ARTHROPLASTY Left    TRANSURETHRAL RESECTION OF PROSTATE  [id number]   UMBILICAL HERNIA REPAIR  2007    Prior to Admission medications   Medication Sig Start Date End Date Taking? Authorizing Provider  acetaminophen (TYLENOL) 325 MG tablet Take 2 tablets (650 mg total) by mouth every 6 (six) hours as needed for mild pain or moderate pain (or Fever >/= 101). 06/16/17   Dustin Flock, MD  amLODipine (NORVASC) 5 MG tablet TAKE 1 TABLET EVERY DAY 10/03/18   Venia Carbon, MD  apixaban (ELIQUIS) 2.5 MG TABS tablet Take 1 tablet (2.5 mg total) by mouth 2 (two) times daily. 07/26/18   Sindy Guadeloupe, MD  carbidopa-levodopa (SINEMET IR) 25-100 MG tablet TAKE 1 TABLET  THREE TIMES DAILY 10/03/18   Venia Carbon, MD  Cholecalciferol (VITAMIN D) 2000 UNITS CAPS Take 2,000 Units by mouth daily.     [provider]  levothyroxine (SYNTHROID) 112 MCG tablet TAKE 1 TABLET EVERY DAY BEFORE BREAKFAST 10/03/18   Viviana Simpler I, MD  polyethylene glycol (MIRALAX / GLYCOLAX) packet Take 17 g by mouth daily.     [provider]  vitamin B-12 (CYANOCOBALAMIN) 1000 MCG tablet Take 1,000 mcg by mouth daily.    [provider]    Allergies Quinolones  Family History  Problem Relation Age of Onset   Dementia Mother    Stroke Father    Pulmonary disease Brother    Heart disease Neg Hx    Diabetes Neg Hx     Social History Social History   Tobacco Use   Smoking status: Former Smoker    Types: Cigars    Quit date: 04/27/2002    Years since quitting: 16.5   Smokeless tobacco: Never Used  Substance Use Topics   Alcohol use: Not Currently    Alcohol/week: 0.0 standard drinks   Drug use: No    Review of Systems  Review of Systems  Constitutional: Positive for fatigue. Negative for chills and fever.  HENT: Negative for sore throat.   Respiratory: Negative for shortness of breath.   Cardiovascular: Negative for chest pain.  Gastrointestinal: Positive for abdominal pain, diarrhea, nausea and vomiting.  Genitourinary: Negative for flank pain.  Musculoskeletal: Negative for neck pain.  Skin: Negative for rash and wound.  Allergic/Immunologic: Negative for immunocompromised state.  Neurological: Positive for weakness. Negative for numbness.  Hematological: Does not bruise/bleed easily.     ____________________________________________  PHYSICAL EXAM:      VITAL SIGNS: ED Triage Vitals  Enc Vitals Group     BP 11/10/18 2056 137/67     Pulse Rate 11/10/18 2056 80     Resp 11/10/18 2056 18     Temp 11/10/18 2056 (!) 101.5 F (38.6 C)     Temp Source 11/10/18 2056 Rectal     SpO2 11/10/18 2056 97 %     Weight  11/10/18 2038 164 lb 14.5 oz (74.8 kg)     Height 11/10/18 2038 5\' 10"  (1.778 m)     Head Circumference --      Peak Flow --      Pain Score 11/10/18 2037 0     Pain Loc --      Pain Edu? --      Excl. in [place]? --      Physical Exam Vitals signs and nursing note reviewed.  Constitutional:      General: He is not in acute distress.    Appearance: He is well-developed.  HENT:     Head: Normocephalic and atraumatic.  Eyes:     Conjunctiva/sclera: Conjunctivae normal.  Neck:     Musculoskeletal: Neck supple.  Cardiovascular:     Rate and Rhythm: Normal rate and  regular rhythm.     Heart sounds: Normal heart sounds. No murmur. No friction rub.  Pulmonary:     Effort: Pulmonary effort is normal. No respiratory distress.     Breath sounds: Normal breath sounds. No wheezing or rales.  Abdominal:     General: Bowel sounds are normal. There is no distension.     Palpations: Abdomen is soft.     Tenderness: There is generalized abdominal tenderness. There is no guarding or rebound.  Skin:    General: Skin is warm.     Capillary Refill: Capillary refill takes less than 2 seconds.  Neurological:     Mental Status: He is alert and oriented to person, place, and time.     Motor: No abnormal muscle tone.       ____________________________________________   LABS (all labs ordered are listed, but only abnormal results are displayed)  Labs Reviewed  CBC WITH DIFFERENTIAL/PLATELET - Abnormal; Notable for the following components:      Result Value   WBC 14.0 (*)    RBC 4.19 (*)    Hemoglobin 12.8 (*)    Neutro Abs 11.8 (*)    Monocytes Absolute 1.4 (*)    Abs Immature Granulocytes 0.08 (*)    All other components within normal limits  COMPREHENSIVE METABOLIC PANEL - Abnormal; Notable for the following components:   Glucose, Bld 106 (*)    BUN 30 (*)    Creatinine, Ser 1.86 (*)    AST 13 (*)    Total Bilirubin 1.4 (*)    GFR calc non Af Amer 32 (*)    GFR calc Af Amer 37 (*)     All other components within normal limits  URINALYSIS, COMPLETE (UACMP) WITH MICROSCOPIC - Abnormal; Notable for the following components:   Color, Urine AMBER (*)    APPearance HAZY (*)    Hgb urine dipstick MODERATE (*)    Ketones, ur 5 (*)    Protein, ur 30 (*)    Bacteria, UA RARE (*)    All other components within normal limits  APTT - Abnormal; Notable for the following components:   aPTT 38 (*)    All other components within normal limits  PROTIME-INR - Abnormal; Notable for the following components:   Prothrombin Time 16.7 (*)    INR 1.4 (*)    All other components within normal limits  SARS CORONAVIRUS 2 (HOSPITAL ORDER, [facility])  C DIFFICILE QUICK SCREEN W PCR REFLEX  CULTURE, BLOOD (ROUTINE X 2)  CULTURE, BLOOD (ROUTINE X 2)  URINE CULTURE  GASTROINTESTINAL PANEL BY PCR, STOOL (REPLACES STOOL CULTURE)  LIPASE, BLOOD  LACTIC ACID, PLASMA    ____________________________________________  EKG: Normal sinus rhythm, ventricular rate 77.  QRS 24, QTc 472.  Right bundle branch block.  No acute abnormality. ________________________________________  RADIOLOGY All imaging, including plain films, CT scans, and ultrasounds, independently reviewed by me, and interpretations confirmed via formal radiology reads.  ED MD interpretation:   Chest x-ray: Negative CT abdomen pelvis: pending  Official radiology report(s): Dg Chest Port 1 View  Result Date: 11/10/2018 CLINICAL DATA:  Sepsis. Fever. EXAM: PORTABLE CHEST 1 VIEW COMPARISON:  Radiograph 09/11/2018 FINDINGS: Patient is rotated. Grossly unchanged heart size and mediastinal contours. No acute airspace disease. No pulmonary edema, large pleural effusion or pneumothorax. No acute osseous abnormalities are seen. IMPRESSION: Rotated exam without acute finding. Electronically Signed   By: Keith Rake M.D.   On: 11/10/2018 21:46  ____________________________________________  PROCEDURES   Procedure(s) performed (including Critical Care):  Procedures  ____________________________________________  INITIAL IMPRESSION / MDM / [place] / ED COURSE  As part of my medical decision making, I reviewed the following data within the electronic MEDICAL RECORD NUMBER Notes from prior ED visits and [place] Controlled Substance Database      *Walter [name] was evaluated in Emergency Department on 11/10/2018 for the symptoms described in the history of present illness. He was evaluated in the context of the global COVID-19 pandemic, which necessitated consideration that the patient might be at risk for infection with the SARS-CoV-2 virus that causes COVID-19. Institutional protocols and algorithms that pertain to the evaluation of patients at risk for COVID-19 are in a state of rapid change based on information released by regulatory bodies including the CDC and federal and state organizations. These policies and algorithms were followed during the patient's care in the ED.  Some ED evaluations and interventions may be delayed as a result of limited staffing during the pandemic.*      Medical Decision Making: 85 year old male here with diarrhea.  He is febrile on arrival.  History of C. difficile, which is primary concern, but must also consider diverticulitis, primary bacterial colitis.  COVID negative.  Lab work shows significant leukocytosis, and mild AKI.  Patient given fluids.  Lactic acid is normal, which is reassuring, I do not suspect ischemic colitis.  Given his complex history, antibiotics initially held, but will give empiric dose of Rocephin Flagyl, which should also cover partially for C. difficile.  He has not had a bowel movement here but will send stated.  Plan to admit for further management.  Patient care transferred to Dr. Archie Balboa at the end of my shift. Patient presentation, ED course, and plan of care  discussed with review of all pertinent labs and imaging. Please see his/her note for further details regarding further ED course and disposition.  ____________________________________________  FINAL CLINICAL IMPRESSION(S) / ED DIAGNOSES  Final diagnoses:  Fever in adult  Other elevated white blood cell (WBC) count  AKI (acute kidney injury) ([place])  Diarrhea of presumed infectious origin     MEDICATIONS GIVEN DURING THIS VISIT:  Medications  iohexol (OMNIPAQUE) 9 MG/ML oral solution 500 mL (500 mLs Oral Contrast Given 11/10/18 2220)  metroNIDAZOLE (FLAGYL) IVPB 500 mg (500 mg Intravenous New Bag/Given 11/10/18 2344)  cefTRIAXone (ROCEPHIN) 2 g in sodium chloride 0.9 % 100 mL IVPB (has no administration in time range)  sodium chloride 0.9 % bolus 1,000 mL (1,000 mLs Intravenous New Bag/Given 11/10/18 2147)  sodium chloride 0.9 % bolus 1,000 mL (1,000 mLs Intravenous New Bag/Given 11/10/18 2344)  iohexol (OMNIPAQUE) 300 MG/ML solution 75 mL (75 mLs Intravenous Contrast Given 11/10/18 2321)     ED Discharge Orders    None       Note:  This document was prepared using Dragon voice recognition software and may include unintentional dictation errors.   Duffy Bruce, MD 11/10/18 [phone]

## 2018-11-10 NOTE — ED Triage Notes (Signed)
Pt arrives via [facility] with c/o diarrhea x 24 hours. Per EMS, pt had temperature of 99.7 degrees. Pt has hx of parkinson's and was seen here yesterday for a fall.

## 2018-11-11 ENCOUNTER — Other Ambulatory Visit: Payer: Self-pay

## 2018-11-11 DIAGNOSIS — R197 Diarrhea, unspecified: Secondary | ICD-10-CM | POA: Diagnosis not present

## 2018-11-11 DIAGNOSIS — E876 Hypokalemia: Secondary | ICD-10-CM | POA: Diagnosis not present

## 2018-11-11 DIAGNOSIS — I714 Abdominal aortic aneurysm, without rupture: Secondary | ICD-10-CM | POA: Diagnosis not present

## 2018-11-11 DIAGNOSIS — M2550 Pain in unspecified joint: Secondary | ICD-10-CM | POA: Diagnosis not present

## 2018-11-11 DIAGNOSIS — E039 Hypothyroidism, unspecified: Secondary | ICD-10-CM | POA: Diagnosis not present

## 2018-11-11 DIAGNOSIS — W06XXXD Fall from bed, subsequent encounter: Secondary | ICD-10-CM | POA: Diagnosis not present

## 2018-11-11 DIAGNOSIS — E890 Postprocedural hypothyroidism: Secondary | ICD-10-CM | POA: Diagnosis present

## 2018-11-11 DIAGNOSIS — R5381 Other malaise: Secondary | ICD-10-CM | POA: Diagnosis present

## 2018-11-11 DIAGNOSIS — I129 Hypertensive chronic kidney disease with stage 1 through stage 4 chronic kidney disease, or unspecified chronic kidney disease: Secondary | ICD-10-CM | POA: Diagnosis not present

## 2018-11-11 DIAGNOSIS — Z7401 Bed confinement status: Secondary | ICD-10-CM | POA: Diagnosis not present

## 2018-11-11 DIAGNOSIS — Z96652 Presence of left artificial knee joint: Secondary | ICD-10-CM | POA: Diagnosis present

## 2018-11-11 DIAGNOSIS — Z89421 Acquired absence of other right toe(s): Secondary | ICD-10-CM | POA: Diagnosis not present

## 2018-11-11 DIAGNOSIS — Z79890 Hormone replacement therapy (postmenopausal): Secondary | ICD-10-CM | POA: Diagnosis not present

## 2018-11-11 DIAGNOSIS — W06XXXA Fall from bed, initial encounter: Secondary | ICD-10-CM | POA: Diagnosis present

## 2018-11-11 DIAGNOSIS — S0101XA Laceration without foreign body of scalp, initial encounter: Secondary | ICD-10-CM | POA: Diagnosis not present

## 2018-11-11 DIAGNOSIS — N400 Enlarged prostate without lower urinary tract symptoms: Secondary | ICD-10-CM | POA: Diagnosis not present

## 2018-11-11 DIAGNOSIS — N179 Acute kidney failure, unspecified: Secondary | ICD-10-CM | POA: Diagnosis not present

## 2018-11-11 DIAGNOSIS — Z87442 Personal history of urinary calculi: Secondary | ICD-10-CM | POA: Diagnosis not present

## 2018-11-11 DIAGNOSIS — G20 Parkinson's disease: Secondary | ICD-10-CM | POA: Diagnosis not present

## 2018-11-11 DIAGNOSIS — Z9049 Acquired absence of other specified parts of digestive tract: Secondary | ICD-10-CM | POA: Diagnosis not present

## 2018-11-11 DIAGNOSIS — Z79899 Other long term (current) drug therapy: Secondary | ICD-10-CM | POA: Diagnosis not present

## 2018-11-11 DIAGNOSIS — K529 Noninfective gastroenteritis and colitis, unspecified: Secondary | ICD-10-CM | POA: Diagnosis not present

## 2018-11-11 DIAGNOSIS — Z7901 Long term (current) use of anticoagulants: Secondary | ICD-10-CM | POA: Diagnosis not present

## 2018-11-11 DIAGNOSIS — Z86718 Personal history of other venous thrombosis and embolism: Secondary | ICD-10-CM | POA: Diagnosis not present

## 2018-11-11 DIAGNOSIS — R1312 Dysphagia, oropharyngeal phase: Secondary | ICD-10-CM | POA: Diagnosis present

## 2018-11-11 DIAGNOSIS — S0101XD Laceration without foreign body of scalp, subsequent encounter: Secondary | ICD-10-CM | POA: Diagnosis not present

## 2018-11-11 DIAGNOSIS — E860 Dehydration: Secondary | ICD-10-CM | POA: Diagnosis present

## 2018-11-11 DIAGNOSIS — Z86711 Personal history of pulmonary embolism: Secondary | ICD-10-CM | POA: Diagnosis not present

## 2018-11-11 DIAGNOSIS — Z20828 Contact with and (suspected) exposure to other viral communicable diseases: Secondary | ICD-10-CM | POA: Diagnosis not present

## 2018-11-11 DIAGNOSIS — Z9079 Acquired absence of other genital organ(s): Secondary | ICD-10-CM | POA: Diagnosis not present

## 2018-11-11 DIAGNOSIS — N183 Chronic kidney disease, stage 3 (moderate): Secondary | ICD-10-CM | POA: Diagnosis not present

## 2018-11-11 DIAGNOSIS — A0471 Enterocolitis due to Clostridium difficile, recurrent: Secondary | ICD-10-CM | POA: Diagnosis not present

## 2018-11-11 DIAGNOSIS — L8990 Pressure ulcer of unspecified site, unspecified stage: Secondary | ICD-10-CM | POA: Insufficient documentation

## 2018-11-11 DIAGNOSIS — R404 Transient alteration of awareness: Secondary | ICD-10-CM | POA: Diagnosis not present

## 2018-11-11 DIAGNOSIS — I2782 Chronic pulmonary embolism: Secondary | ICD-10-CM | POA: Diagnosis not present

## 2018-11-11 LAB — CBC
HCT: 37.3 % — ABNORMAL LOW (ref 39.0–52.0)
Hemoglobin: 12.1 g/dL — ABNORMAL LOW (ref 13.0–17.0)
MCH: 30.2 pg (ref 26.0–34.0)
MCHC: 32.4 g/dL (ref 30.0–36.0)
MCV: 93 fL (ref 80.0–100.0)
Platelets: 159 10*3/uL (ref 150–400)
RBC: 4.01 MIL/uL — ABNORMAL LOW (ref 4.22–5.81)
RDW: 13 % (ref 11.5–15.5)
WBC: 11.9 10*3/uL — ABNORMAL HIGH (ref 4.0–10.5)
nRBC: 0 % (ref 0.0–0.2)

## 2018-11-11 LAB — GASTROINTESTINAL PANEL BY PCR, STOOL (REPLACES STOOL CULTURE)

## 2018-11-11 LAB — BASIC METABOLIC PANEL
Anion gap: 8 (ref 5–15)
BUN: 23 mg/dL (ref 8–23)
CO2: 23 mmol/L (ref 22–32)
Calcium: 8 mg/dL — ABNORMAL LOW (ref 8.9–10.3)
Chloride: 108 mmol/L (ref 98–111)
Creatinine, Ser: 1.52 mg/dL — ABNORMAL HIGH (ref 0.61–1.24)
GFR calc Af Amer: 48 mL/min — ABNORMAL LOW (ref >60)
GFR calc non Af Amer: 41 mL/min — ABNORMAL LOW (ref >60)
Glucose, Bld: 97 mg/dL (ref 70–99)
Potassium: 3.2 mmol/L — ABNORMAL LOW (ref 3.5–5.1)
Sodium: 139 mmol/L (ref 135–145)

## 2018-11-11 LAB — GLUCOSE, CAPILLARY: Glucose-Capillary: 91 mg/dL (ref 70–99)

## 2018-11-11 LAB — C DIFFICILE QUICK SCREEN W PCR REFLEX
C Diff antigen: POSITIVE — AB
C Diff interpretation: DETECTED
C Diff toxin: POSITIVE — AB

## 2018-11-11 LAB — TSH: TSH: 0.569 u[IU]/mL (ref 0.350–4.500)

## 2018-11-11 MED ORDER — HYDROCODONE-ACETAMINOPHEN 5-325 MG PO TABS: 1.0000 | ORAL_TABLET | ORAL | Status: DC | PRN

## 2018-11-11 MED ORDER — TRAZODONE HCL 50 MG PO TABS
25.0000 mg | ORAL_TABLET | Freq: Every evening | ORAL | Status: DC | PRN
  Administered 2018-11-11: 25 mg via ORAL
  Filled 2018-11-11: qty 1

## 2018-11-11 MED ORDER — ACETAMINOPHEN 325 MG PO TABS
650.0000 mg | ORAL_TABLET | Freq: Four times a day (QID) | ORAL | Status: DC | PRN
  Administered 2018-11-13: 650 mg via ORAL
  Filled 2018-11-11: qty 2

## 2018-11-11 MED ORDER — SODIUM CHLORIDE 0.9 % IV SOLN: 2.0000 g | INTRAVENOUS | Status: DC

## 2018-11-11 MED ORDER — APIXABAN 2.5 MG PO TABS
2.5000 mg | ORAL_TABLET | Freq: Two times a day (BID) | ORAL | Status: DC
  Administered 2018-11-11 – 2018-11-13 (×5): 2.5 mg via ORAL
  Filled 2018-11-11 (×5): qty 1

## 2018-11-11 MED ORDER — ACETAMINOPHEN 650 MG RE SUPP: 650.0000 mg | Freq: Four times a day (QID) | RECTAL | Status: DC | PRN

## 2018-11-11 MED ORDER — POTASSIUM CHLORIDE CRYS ER 20 MEQ PO TBCR
40.0000 meq | EXTENDED_RELEASE_TABLET | Freq: Once | ORAL | Status: AC
  Administered 2018-11-11: 10:00:00 40 meq via ORAL
  Filled 2018-11-11: qty 2

## 2018-11-11 MED ORDER — VITAMIN B-12 1000 MCG PO TABS
1000.0000 ug | ORAL_TABLET | Freq: Every day | ORAL | Status: DC
  Administered 2018-11-11 – 2018-11-13 (×3): 1000 ug via ORAL
  Filled 2018-11-11 (×3): qty 1

## 2018-11-11 MED ORDER — SODIUM CHLORIDE 0.9 % IV SOLN
INTRAVENOUS | Status: DC
  Administered 2018-11-11 – 2018-11-13 (×5): via INTRAVENOUS

## 2018-11-11 MED ORDER — VITAMIN D 25 MCG (1000 UNIT) PO TABS
2000.0000 [IU] | ORAL_TABLET | Freq: Every day | ORAL | Status: DC
  Administered 2018-11-11 – 2018-11-13 (×3): 2000 [IU] via ORAL
  Filled 2018-11-11 (×3): qty 2

## 2018-11-11 MED ORDER — LEVOTHYROXINE SODIUM 112 MCG PO TABS
112.0000 ug | ORAL_TABLET | Freq: Every day | ORAL | Status: DC
  Administered 2018-11-11 – 2018-11-13 (×3): 112 ug via ORAL
  Filled 2018-11-11 (×3): qty 1

## 2018-11-11 MED ORDER — ONDANSETRON HCL 4 MG/2ML IJ SOLN: 4.0000 mg | Freq: Four times a day (QID) | INTRAMUSCULAR | Status: DC | PRN

## 2018-11-11 MED ORDER — METRONIDAZOLE IN NACL 5-0.79 MG/ML-% IV SOLN
500.0000 mg | Freq: Three times a day (TID) | INTRAVENOUS | Status: DC
  Filled 2018-11-11 (×2): qty 100

## 2018-11-11 MED ORDER — CARBIDOPA-LEVODOPA 25-100 MG PO TABS
1.0000 | ORAL_TABLET | Freq: Three times a day (TID) | ORAL | Status: DC
  Administered 2018-11-11 – 2018-11-13 (×7): 1 via ORAL
  Filled 2018-11-11 (×9): qty 1

## 2018-11-11 MED ORDER — ACETAMINOPHEN 325 MG PO TABS: 650.0000 mg | ORAL_TABLET | Freq: Four times a day (QID) | ORAL | Status: DC | PRN

## 2018-11-11 MED ORDER — ONDANSETRON HCL 4 MG PO TABS: 4.0000 mg | ORAL_TABLET | Freq: Four times a day (QID) | ORAL | Status: DC | PRN

## 2018-11-11 MED ORDER — VANCOMYCIN 50 MG/ML ORAL SOLUTION
125.0000 mg | Freq: Four times a day (QID) | ORAL | Status: DC
  Administered 2018-11-11 – 2018-11-13 (×11): 125 mg via ORAL
  Filled 2018-11-11 (×12): qty 2.5

## 2018-11-11 NOTE — ED Notes (Signed)
MD Goodman at bedside. 

## 2018-11-11 NOTE — Progress Notes (Signed)
[facility] [place] NAME: Walter [name]    MR#:  [id number]  DATE OF BIRTH:  04/19/1931  SUBJECTIVE:  CHIEF COMPLAINT:   Chief Complaint  Patient presents with   Diarrhea   No new complaint this morning.  Patient denies any abdominal pains.  No diarrhea overnight.  No nausea vomiting. REVIEW OF SYSTEMS:  Review of Systems  Constitutional: Negative for chills and fever.  HENT: Negative for hearing loss and tinnitus.   Eyes: Negative for blurred vision and double vision.  Respiratory: Negative for cough and shortness of breath.   Cardiovascular: Negative for chest pain and orthopnea.  Gastrointestinal: Negative for nausea and vomiting.       Diarrhea significantly improved.  Genitourinary: Negative for dysuria and urgency.  Musculoskeletal: Negative for myalgias and neck pain.  Skin: Negative for itching and rash.  Neurological: Negative for dizziness and headaches.  Psychiatric/Behavioral: Negative for depression and hallucinations.    DRUG ALLERGIES:   Allergies  Allergen Reactions   Quinolones     tendonitis   VITALS:  Blood pressure 115/62, pulse 76, temperature 98.1 F (36.7 C), temperature source Oral, resp. rate 16, height 5\' 10"  (1.778 m), weight 77.6 kg, SpO2 96 %. PHYSICAL EXAMINATION:  Physical Exam  GENERAL:  85 y.o.-year-old patient lying in the bed with no acute distress.  EYES: Pupils equal, round, reactive to light and accommodation. No scleral icterus. Extraocular muscles intact.  HEENT: Head atraumatic, normocephalic. Oropharynx and nasopharynx clear.  NECK:  Supple, no jugular venous distention. No thyroid enlargement, no tenderness.  LUNGS: Normal breath sounds bilaterally, no wheezing, rales,rhonchi or crepitation. No use of accessory muscles of respiration.  CARDIOVASCULAR: S1, S2 normal. No murmurs, rubs, or gallops.  ABDOMEN: Soft, nontender, nondistended. Bowel sounds present. No organomegaly or mass.    EXTREMITIES: No pedal edema, cyanosis, or clubbing.  NEUROLOGIC: Moving all extremities with no focal deficit.  Sensation intact. Gait not checked.  PSYCHIATRIC: The patient is alert and oriented x 3.  SKIN: No obvious rash, lesion, or ulcer.  LABORATORY PANEL:  Male CBC Recent Labs  Lab 11/11/18 0631  WBC 11.9*  HGB 12.1*  HCT 37.3*  PLT 159   ------------------------------------------------------------------------------------------------------------------ Chemistries  Recent Labs  Lab 11/10/18 2111 11/11/18 0631  NA 138 139  K 3.7 3.2*  CL 101 108  CO2 26 23  GLUCOSE 106* 97  BUN 30* 23  CREATININE 1.86* 1.52*  CALCIUM 8.9 8.0*  AST 13*  --   ALT 5  --   ALKPHOS 76  --   BILITOT 1.4*  --    RADIOLOGY:  Ct Abdomen Pelvis W Contrast  Result Date: 11/10/2018 CLINICAL DATA:  Nausea, vomiting, fall yesterday EXAM: CT ABDOMEN AND PELVIS WITH CONTRAST TECHNIQUE: Multidetector CT imaging of the abdomen and pelvis was performed using the standard protocol following bolus administration of intravenous contrast. CONTRAST:  57mL OMNIPAQUE IOHEXOL 300 MG/ML  SOLN COMPARISON:  Numerous priors, most recently CT renal colic [id number] FINDINGS: Lower chest: Bandlike areas of basilar scarring and and atelectasis, with associated subpleural nodular thickening in the left lung base measuring up to 12 mm in size, unchanged from multiple comparisons. Additional scarring in the right middle lobe and lingula. Normal heart size. No pericardial effusion. Atherosclerotic calcification of the coronary arteries. Aortic leaflet calcifications are present. Hepatobiliary: No focal liver abnormality is seen. Patient is post cholecystectomy. Slight prominence of the biliary tree likely related to reservoir effect. No calcified intraductal gallstones.  Pancreas: Atrophic appearance of the pancreas. Pancreas is otherwise unremarkable. No peripancreatic inflammation, visible pancreatic lesions or pancreatic  ductal dilatation. Spleen: Normal in size without focal abnormality. Adrenals/Urinary Tract: Normal adrenal glands. Bilateral nonobstructing nephrolithiasis is present. No obstructive urolithiasis. Right extrarenal pelvis is similar to prior study. Right ureter passes in close proximity to an atherosclerotic calcification mimicking a ureteral calculus but was present on several prior comparisons (3/59). Redemonstration of the large 8.6 cm fluid attenuation cyst arising from the lower pole right kidney. Small thin peripheral calcification but no other concerning feature (Bosniak II). No concerning renal lesions. There is circumferential bladder wall thickening. Hyperdense mural nodule is noted in the dome of the bladder. Stomach/Bowel: Distal esophagus, stomach and duodenal sweep are unremarkable. No bowel wall thickening or dilatation. No evidence of obstruction. A normal appendix is visualized. There is pancolonic mural thickening and extensive pericolonic hazy stranding most pronounced at the level of the splenic flexure and beyond to the level of the rectum. Vascular/Lymphatic: Atherosclerotic plaque within the normal caliber aorta. There is fusiform dilatation of the proximal aorta measuring up to 3.5 cm. Fusiform infrarenal abdominal aortic ectasia up to 3.2 cm is present and grossly similar to comparison study. Extensive ostial plaque results in multilevel narrowing, incompletely evaluated on this non angiographic exam. Scattered prominent though nonenlarged lymph nodes are present in the retroperitoneum. Reproductive: Prostatomegaly. Coarse eccentric calcification of the prostate. No concerning abnormalities of the prostate or seminal vesicles. Other: No abdominopelvic free fluid or free gas. No bowel containing hernias. Small fat containing left inguinal hernia. Musculoskeletal: Right femoral intramedullary nail with transcervical cancellous screw transfixing a remote posttraumatic deformity of the right  femoral neck. Multilevel degenerative changes are present in the imaged portions of the spine. No acute osseous abnormality or suspicious osseous lesion. Bone mineralization is diffusely diminished. Additional degenerative features are present in the bones of the hands and wrists incidentally imaged on this exam. IMPRESSION: 1. Pancolonic mural thickening and extensive pericolonic hazy stranding, most pronounced at the level of the splenic flexure and beyond to the level of the rectum, consistent with colitis, either infectious or inflammatory in etiology. Vascular etiology is less favored given the diffuse nature. 2. Circumferential bladder wall thickening with hyperdense mural nodule in the dome of the bladder, concerning for bladder neoplasm. Recommend further evaluation with cystoscopy. Furthermore recommend correlation with urinalysis to exclude current cystitis. 3. Fusiform infrarenal abdominal aortic ectasia measuring up to 3.2 cm, grossly similar to comparison study. Recommend followup by ultrasound in 3 years. This recommendation follows ACR consensus guidelines: White Paper of the ACR Incidental Findings Committee II on Vascular Findings. J Am Coll Radiol 2013; 10:789-794. Aortic aneurysm NOS (ICD10-I71.9) 4. Bilateral nonobstructing nephrolithiasis. Right extrarenal pelvis. 5. Prior ORIF of a right transcervical femoral fracture with intramedullary nail placement. 6. Extensive left basilar scarring with associated nodular thickening, unchanged since 2019. 7. Aortic Atherosclerosis (ICD10-I70.0). Electronically Signed   By: Lovena Le M.D.   On: 11/10/2018 23:58   Dg Chest Port 1 View  Result Date: 11/10/2018 CLINICAL DATA:  Sepsis. Fever. EXAM: PORTABLE CHEST 1 VIEW COMPARISON:  Radiograph 09/11/2018 FINDINGS: Patient is rotated. Grossly unchanged heart size and mediastinal contours. No acute airspace disease. No pulmonary edema, large pleural effusion or pneumothorax. No acute osseous abnormalities  are seen. IMPRESSION: Rotated exam without acute finding. Electronically Signed   By: Keith Rake M.D.   On: 11/10/2018 21:46   ASSESSMENT AND PLAN:   85 year old male admitted for C. difficile colitis  *  C. difficile colitis Patient currently on p.o. vancomycin.  Leukocytosis improved. Diarrhea improving.  No diarrhea reported this morning. IV fluid hydration.  Monitor clinically.  *Acute renal failure Patient presented with acute kidney injury superimposed on chronic kidney disease stage III Renal function improving with IV fluid hydration. -Hydrate with IV fluids and monitor kidney function.  Likely prerenal from dehydration due to diarrhea  *Hypothyroidism -TSH of 0.569 , continue Synthroid  *History of DVT/PE -Continue Eliquis  DVT prophylaxis; patient already on Eliquis.  All the records are reviewed and case discussed with Care Management/Social Worker. Management plans discussed with the patient, family and they are in agreement.  CODE STATUS: Full Code  TOTAL TIME TAKING CARE OF THIS PATIENT: 33 minutes.   More than 50% of the time was spent in counseling/coordination of care: YES  POSSIBLE D/C IN 3 DAYS, DEPENDING ON CLINICAL CONDITION.   Rayma Hegg M.D on 11/11/2018 at 10:22 AM  Between 7am to 6pm - Pager - [phone]  After 6pm go to [email or web address] - Proofreader  [facility]  Office  [phone]  CC: Primary care physician; Venia Carbon, MD  Note: This dictation was prepared with Dragon dictation along with smaller phrase technology. Any transcriptional errors that result from this process are unintentional.

## 2018-11-11 NOTE — H&P (Signed)
[facility] [place] NAME: Walter [name]    MR#:  [id number]  DATE OF BIRTH:  Mar 16, 1931  DATE OF ADMISSION:  11/10/2018  PRIMARY CARE PHYSICIAN: Venia Carbon, MD   REQUESTING/REFERRING PHYSICIAN: Duffy Bruce, MD  CHIEF COMPLAINT:   Chief Complaint  Patient presents with   Diarrhea    HISTORY OF PRESENT ILLNESS:  Walter [name]  is a 85 y.o. male with a known history of abdominal aortic aneurysm, hypothyroidism,  history of DVT being admitted for colitis.  He came in with diarrhea and abdominal discomfort.  The patient states that for the last week, he has had persistent, watery, diarrhea.  It is been foul-smelling.  He is had minimal diffuse abdominal cramping when he has diarrhea but no overt persistent abdominal pain.  He does not recall having fever but had fever to 101.2 here in the emergency department.  Has had some mild chills.  Said decreased appetite.  He is felt generally weak.    CT showed colitis for which she is being admitted for further evaluation and management. PAST MEDICAL HISTORY:   Past Medical History:  Diagnosis Date   Abdominal aortic aneurysm (AAA) ([place])    ARF (acute renal failure) ([place]) 2011   after TKR   BPH (benign prostatic hypertrophy)    DVT (deep venous thrombosis) ([place]) 1998   after left knee arthroscopy   Hx of colonoscopy 2000   Hypertension    Hypothyroid 2007   postop from thyroidectomy (cancer scare)   Kidney stones    recurrent   Oral cancer ([place]) 2004   graft from left thigh   Osteoarthritis of both knees    Parkinson's disease ([place])    Pulmonary embolism ([place])    Tendonitis 2012   from quinolone   Toe amputation status 2010    PAST SURGICAL HISTORY:   Past Surgical History:  Procedure Laterality Date   APPENDECTOMY     CHOLECYSTECTOMY     CYSTOSCOPY/URETEROSCOPY/HOLMIUM LASER/STENT PLACEMENT Right 09/13/2017   Procedure: CYSTOSCOPY/URETEROSCOPY/HOLMIUM  LASER/STENT PLACEMENT;  Surgeon: Hollice Espy, MD;  Location: [facility] ORS;  Service: Urology;  Laterality: Right;   HERNIA REPAIR     INTRAMEDULLARY (IM) NAIL INTERTROCHANTERIC Right 06/13/2017   Procedure: INTRAMEDULLARY (IM) NAIL INTERTROCHANTRIC;  Surgeon: Hessie Knows, MD;  Location: [facility] ORS;  Service: Orthopedics;  Laterality: Right;   LITHOTRIPSY  2009/2010   then stent and cystoscopic removal 2014   Oral cancer removed Right 2004   REPAIR ZENKER'S DIVERTICULA  2013   THYROIDECTOMY  2007   TOE AMPUTATION Right 2010   badly overlapping other toe   TOTAL KNEE ARTHROPLASTY Left    TRANSURETHRAL RESECTION OF PROSTATE  [id number]   UMBILICAL HERNIA REPAIR  2007    SOCIAL HISTORY:   Social History   Tobacco Use   Smoking status: Former Smoker    Types: Cigars    Quit date: 04/27/2002    Years since quitting: 16.5   Smokeless tobacco: Never Used  Substance Use Topics   Alcohol use: Not Currently    Alcohol/week: 0.0 standard drinks    FAMILY HISTORY:   Family History  Problem Relation Age of Onset   Dementia Mother    Stroke Father    Pulmonary disease Brother    Heart disease Neg Hx    Diabetes Neg Hx     DRUG ALLERGIES:   Allergies  Allergen Reactions   Quinolones     tendonitis  REVIEW OF SYSTEMS:   Review of Systems  Constitutional: Negative for diaphoresis, fever, malaise/fatigue and weight loss.  HENT: Negative for ear discharge, ear pain, hearing loss, nosebleeds, sore throat and tinnitus.   Eyes: Negative for blurred vision and pain.  Respiratory: Negative for cough, hemoptysis, shortness of breath and wheezing.   Cardiovascular: Negative for chest pain, palpitations, orthopnea and leg swelling.  Gastrointestinal: Positive for abdominal pain, diarrhea and nausea. Negative for blood in stool, constipation, heartburn and vomiting.  Genitourinary: Negative for dysuria, frequency and urgency.  Musculoskeletal: Negative for back pain and  myalgias.  Skin: Negative for itching and rash.  Neurological: Negative for dizziness, tingling, tremors, focal weakness, seizures, weakness and headaches.  Psychiatric/Behavioral: Negative for depression. The patient is not nervous/anxious.     MEDICATIONS AT HOME:   Prior to Admission medications   Medication Sig Start Date End Date Taking? Authorizing Provider  acetaminophen (TYLENOL) 325 MG tablet Take 2 tablets (650 mg total) by mouth every 6 (six) hours as needed for mild pain or moderate pain (or Fever >/= 101). 06/16/17   Dustin Flock, MD  amLODipine (NORVASC) 5 MG tablet TAKE 1 TABLET EVERY DAY 10/03/18   Venia Carbon, MD  apixaban (ELIQUIS) 2.5 MG TABS tablet Take 1 tablet (2.5 mg total) by mouth 2 (two) times daily. 07/26/18   Sindy Guadeloupe, MD  carbidopa-levodopa (SINEMET IR) 25-100 MG tablet TAKE 1 TABLET THREE TIMES DAILY 10/03/18   Venia Carbon, MD  Cholecalciferol (VITAMIN D) 2000 UNITS CAPS Take 2,000 Units by mouth daily.     [provider]  levothyroxine (SYNTHROID) 112 MCG tablet TAKE 1 TABLET EVERY DAY BEFORE BREAKFAST 10/03/18   Viviana Simpler I, MD  polyethylene glycol (MIRALAX / GLYCOLAX) packet Take 17 g by mouth daily.     [provider]  vitamin B-12 (CYANOCOBALAMIN) 1000 MCG tablet Take 1,000 mcg by mouth daily.    [provider]      VITAL SIGNS:  Blood pressure 140/71, pulse 80, temperature (!) 101.5 F (38.6 C), temperature source Rectal, resp. rate 16, height 5\' 10"  (1.778 m), weight 74.8 kg, SpO2 97 %.  PHYSICAL EXAMINATION:  Physical Exam  GENERAL:  85 y.o.-year-old patient lying in the bed with no acute distress.  EYES: Pupils equal, round, reactive to light and accommodation. No scleral icterus. Extraocular muscles intact.  HEENT: Head atraumatic, normocephalic. Oropharynx and nasopharynx clear.  NECK:  Supple, no jugular venous distention. No thyroid enlargement, no tenderness.  LUNGS: Normal breath sounds  bilaterally, no wheezing, rales,rhonchi or crepitation. No use of accessory muscles of respiration.  CARDIOVASCULAR: S1, S2 normal. No murmurs, rubs, or gallops.  ABDOMEN: Soft, nontender, nondistended. Bowel sounds present. No organomegaly or mass.  EXTREMITIES: No pedal edema, cyanosis, or clubbing.  NEUROLOGIC: Cranial nerves II through XII are intact. Muscle strength 5/5 in all extremities. Sensation intact. Gait not checked.  PSYCHIATRIC: The patient is alert and oriented x 3.  SKIN: No obvious rash, lesion, or ulcer.   LABORATORY PANEL:   CBC Recent Labs  Lab 11/10/18 2111  WBC 14.0*  HGB 12.8*  HCT 39.0  PLT 194   ------------------------------------------------------------------------------------------------------------------  Chemistries  Recent Labs  Lab 11/10/18 2111  NA 138  K 3.7  CL 101  CO2 26  GLUCOSE 106*  BUN 30*  CREATININE 1.86*  CALCIUM 8.9  AST 13*  ALT 5  ALKPHOS 76  BILITOT 1.4*   ------------------------------------------------------------------------------------------------------------------  Cardiac Enzymes No results for input(s): TROPONINI in the  last 168 hours. ------------------------------------------------------------------------------------------------------------------  RADIOLOGY:  Ct Head Wo Contrast  Result Date: 11/09/2018 CLINICAL DATA:  Patient fell out of bed while sleeping. Hit head on dresser. Laceration back of right head. EXAM: CT HEAD WITHOUT CONTRAST TECHNIQUE: Contiguous axial images were obtained from the base of the skull through the vertex without intravenous contrast. COMPARISON:  None. FINDINGS: Brain: There is no evidence for acute hemorrhage, hydrocephalus, mass lesion, or abnormal extra-axial fluid collection. No definite CT evidence for acute infarction. Diffuse loss of parenchymal volume is consistent with atrophy. Patchy low attenuation in the deep hemispheric and periventricular white matter is nonspecific, but  likely reflects chronic microvascular ischemic demyelination. Vascular: No hyperdense vessel or unexpected calcification. Skull: No evidence for fracture. No worrisome lytic or sclerotic lesion. Sinuses/Orbits: The visualized paranasal sinuses and mastoid air cells are clear. Visualized portions of the globes and intraorbital fat are unremarkable. Other: None. IMPRESSION: 1. No acute intracranial abnormality. 2. Atrophy with chronic small vessel white matter ischemic disease. Electronically Signed   By: Misty Donavyn M.D.   On: 11/09/2018 13:36   Ct Abdomen Pelvis W Contrast  Result Date: 11/10/2018 CLINICAL DATA:  Nausea, vomiting, fall yesterday EXAM: CT ABDOMEN AND PELVIS WITH CONTRAST TECHNIQUE: Multidetector CT imaging of the abdomen and pelvis was performed using the standard protocol following bolus administration of intravenous contrast. CONTRAST:  21mL OMNIPAQUE IOHEXOL 300 MG/ML  SOLN COMPARISON:  Numerous priors, most recently CT renal colic [id number] FINDINGS: Lower chest: Bandlike areas of basilar scarring and and atelectasis, with associated subpleural nodular thickening in the left lung base measuring up to 12 mm in size, unchanged from multiple comparisons. Additional scarring in the right middle lobe and lingula. Normal heart size. No pericardial effusion. Atherosclerotic calcification of the coronary arteries. Aortic leaflet calcifications are present. Hepatobiliary: No focal liver abnormality is seen. Patient is post cholecystectomy. Slight prominence of the biliary tree likely related to reservoir effect. No calcified intraductal gallstones. Pancreas: Atrophic appearance of the pancreas. Pancreas is otherwise unremarkable. No peripancreatic inflammation, visible pancreatic lesions or pancreatic ductal dilatation. Spleen: Normal in size without focal abnormality. Adrenals/Urinary Tract: Normal adrenal glands. Bilateral nonobstructing nephrolithiasis is present. No obstructive urolithiasis.  Right extrarenal pelvis is similar to prior study. Right ureter passes in close proximity to an atherosclerotic calcification mimicking a ureteral calculus but was present on several prior comparisons (3/59). Redemonstration of the large 8.6 cm fluid attenuation cyst arising from the lower pole right kidney. Small thin peripheral calcification but no other concerning feature (Bosniak II). No concerning renal lesions. There is circumferential bladder wall thickening. Hyperdense mural nodule is noted in the dome of the bladder. Stomach/Bowel: Distal esophagus, stomach and duodenal sweep are unremarkable. No bowel wall thickening or dilatation. No evidence of obstruction. A normal appendix is visualized. There is pancolonic mural thickening and extensive pericolonic hazy stranding most pronounced at the level of the splenic flexure and beyond to the level of the rectum. Vascular/Lymphatic: Atherosclerotic plaque within the normal caliber aorta. There is fusiform dilatation of the proximal aorta measuring up to 3.5 cm. Fusiform infrarenal abdominal aortic ectasia up to 3.2 cm is present and grossly similar to comparison study. Extensive ostial plaque results in multilevel narrowing, incompletely evaluated on this non angiographic exam. Scattered prominent though nonenlarged lymph nodes are present in the retroperitoneum. Reproductive: Prostatomegaly. Coarse eccentric calcification of the prostate. No concerning abnormalities of the prostate or seminal vesicles. Other: No abdominopelvic free fluid or free gas. No bowel containing hernias. Small fat containing left  inguinal hernia. Musculoskeletal: Right femoral intramedullary nail with transcervical cancellous screw transfixing a remote posttraumatic deformity of the right femoral neck. Multilevel degenerative changes are present in the imaged portions of the spine. No acute osseous abnormality or suspicious osseous lesion. Bone mineralization is diffusely diminished.  Additional degenerative features are present in the bones of the hands and wrists incidentally imaged on this exam. IMPRESSION: 1. Pancolonic mural thickening and extensive pericolonic hazy stranding, most pronounced at the level of the splenic flexure and beyond to the level of the rectum, consistent with colitis, either infectious or inflammatory in etiology. Vascular etiology is less favored given the diffuse nature. 2. Circumferential bladder wall thickening with hyperdense mural nodule in the dome of the bladder, concerning for bladder neoplasm. Recommend further evaluation with cystoscopy. Furthermore recommend correlation with urinalysis to exclude current cystitis. 3. Fusiform infrarenal abdominal aortic ectasia measuring up to 3.2 cm, grossly similar to comparison study. Recommend followup by ultrasound in 3 years. This recommendation follows ACR consensus guidelines: White Paper of the ACR Incidental Findings Committee II on Vascular Findings. J Am Coll Radiol 2013; 10:789-794. Aortic aneurysm NOS (ICD10-I71.9) 4. Bilateral nonobstructing nephrolithiasis. Right extrarenal pelvis. 5. Prior ORIF of a right transcervical femoral fracture with intramedullary nail placement. 6. Extensive left basilar scarring with associated nodular thickening, unchanged since 2019. 7. Aortic Atherosclerosis (ICD10-I70.0). Electronically Signed   By: Lovena Le M.D.   On: 11/10/2018 23:58   Dg Chest Port 1 View  Result Date: 11/10/2018 CLINICAL DATA:  Sepsis. Fever. EXAM: PORTABLE CHEST 1 VIEW COMPARISON:  Radiograph 09/11/2018 FINDINGS: Patient is rotated. Grossly unchanged heart size and mediastinal contours. No acute airspace disease. No pulmonary edema, large pleural effusion or pneumothorax. No acute osseous abnormalities are seen. IMPRESSION: Rotated exam without acute finding. Electronically Signed   By: Keith Rake M.D.   On: 11/10/2018 21:46   IMPRESSION AND PLAN:  85 year old male being admitted for  colitis  *Colitis -Patient has allergy to Cipro so we will start him on IV Rocephin and Flagyl -Obtain stool culture for C. difficile and GI panel  *Acute renal failure -Hydrate with IV fluids and monitor kidney function.  Likely prerenal from dehydration due to diarrhea  *Hypothyroidism -Check TSH, continue Synthroid  *History of DVT/PE -Continue Eliquis     All the records are reviewed and case discussed with ED provider. Management plans discussed with the patient, family and they are in agreement.  CODE STATUS: Full code  TOTAL TIME TAKING CARE OF THIS PATIENT: 45 minutes.    Max Sane M.D on 11/11/2018 at 12:47 AM  Between 7am to 6pm - Pager - [phone]  After 6pm go to [email or web address] - Proofreader  [facility]  Office  [phone]  CC: Primary care physician; Venia Carbon, MD   Note: This dictation was prepared with Dragon dictation along with smaller phrase technology. Any transcriptional errors that result from this process are unintentional.

## 2018-11-11 NOTE — ED Notes (Signed)
ED TO INPATIENT HANDOFF REPORT  ED Nurse Name and Phone #: Wells Guiles [phone]  S Name/Age/Gender Walter [name] 85 y.o. male Room/Bed: ED03A/ED03A  Code Status   Code Status: Prior  Home/SNF/Other Home Patient oriented to: self, place, time and situation Is this baseline? Yes   Triage Complete: Triage complete  Chief Complaint diarrhea  Triage Note Pt arrives via [facility] with c/o diarrhea x 24 hours. Per EMS, pt had temperature of 99.7 degrees. Pt has hx of parkinson's and was seen here yesterday for a fall.    Allergies Allergies  Allergen Reactions  . Quinolones     tendonitis    Level of Care/Admitting Diagnosis ED Disposition    ED Disposition Condition [facility] Area: [place] [[id number]]  Level of Care: Med-Surg [16]  Covid Evaluation: Asymptomatic Screening Protocol (No Symptoms)  Diagnosis: Colitis [id number]  Admitting Physician: Max Sane [id number]  Attending Physician: Max Sane [id number]  Estimated length of stay: past midnight tomorrow  Certification:: I certify this patient will need inpatient services for at least 2 midnights  PT Class (Do Not Modify): Inpatient [101]  PT Acc Code (Do Not Modify): Private [1]       B Medical/Surgery History Past Medical History:  Diagnosis Date  . Abdominal aortic aneurysm (AAA) ([place])   . ARF (acute renal failure) ([place]) 2011   after TKR  . BPH (benign prostatic hypertrophy)   . DVT (deep venous thrombosis) ([place]) 1998   after left knee arthroscopy  . Hx of colonoscopy 2000  . Hypertension   . Hypothyroid 2007   postop from thyroidectomy (cancer scare)  . Kidney stones    recurrent  . Oral cancer ([place]) 2004   graft from left thigh  . Osteoarthritis of both knees   . Parkinson's disease ([place])   . Pulmonary embolism ([place])   . Tendonitis 2012   from quinolone  . Toe amputation status 2010   Past Surgical History:  Procedure Laterality Date  . APPENDECTOMY    .  CHOLECYSTECTOMY    . CYSTOSCOPY/URETEROSCOPY/HOLMIUM LASER/STENT PLACEMENT Right 09/13/2017   Procedure: CYSTOSCOPY/URETEROSCOPY/HOLMIUM LASER/STENT PLACEMENT;  Surgeon: Hollice Espy, MD;  Location: [facility] ORS;  Service: Urology;  Laterality: Right;  . HERNIA REPAIR    . INTRAMEDULLARY (IM) NAIL INTERTROCHANTERIC Right 06/13/2017   Procedure: INTRAMEDULLARY (IM) NAIL INTERTROCHANTRIC;  Surgeon: Hessie Knows, MD;  Location: [facility] ORS;  Service: Orthopedics;  Laterality: Right;  . LITHOTRIPSY  2009/2010   then stent and cystoscopic removal 2014  . Oral cancer removed Right 2004  . REPAIR ZENKER'S DIVERTICULA  2013  . THYROIDECTOMY  2007  . TOE AMPUTATION Right 2010   badly overlapping other toe  . TOTAL KNEE ARTHROPLASTY Left   . TRANSURETHRAL RESECTION OF PROSTATE  2011  . UMBILICAL HERNIA REPAIR  2007     A IV Location/Drains/Wounds Patient Lines/Drains/Airways Status   Active Line/Drains/Airways    Name:   Placement date:   Placement time:   Site:   Days:   Peripheral IV 11/10/18 Left;Upper Arm   11/10/18    2140    Arm   1   Peripheral IV 11/10/18 Right Forearm   11/10/18    2155    Forearm   1          Intake/Output Last 24 hours No intake or output data in the 24 hours ending 11/11/18 0101  Labs/Imaging Results for orders placed or performed during the hospital encounter of 11/10/18 (from the past  48 hour(s))  CBC with Differential     Status: Abnormal   Collection Time: 11/10/18  9:11 PM  Result Value Ref Range   WBC 14.0 (H) 4.0 - 10.5 K/uL   RBC 4.19 (L) 4.22 - 5.81 MIL/uL   Hemoglobin 12.8 (L) 13.0 - 17.0 g/dL   HCT 39.0 39.0 - 52.0 %   MCV 93.1 80.0 - 100.0 fL   MCH 30.5 26.0 - 34.0 pg   MCHC 32.8 30.0 - 36.0 g/dL   RDW 13.3 11.5 - 15.5 %   Platelets 194 150 - 400 K/uL   nRBC 0.0 0.0 - 0.2 %   Neutrophils Relative % 84 %   Neutro Abs 11.8 (H) 1.7 - 7.7 K/uL   Lymphocytes Relative 5 %   Lymphs Abs 0.7 0.7 - 4.0 K/uL   Monocytes Relative 10 %   Monocytes  Absolute 1.4 (H) 0.1 - 1.0 K/uL   Eosinophils Relative 0 %   Eosinophils Absolute 0.0 0.0 - 0.5 K/uL   Basophils Relative 0 %   Basophils Absolute 0.0 0.0 - 0.1 K/uL   Immature Granulocytes 1 %   Abs Immature Granulocytes 0.08 (H) 0.00 - 0.07 K/uL    Comment: Performed at [facility], [address] [place], [place] [address]  Comprehensive metabolic panel     Status: Abnormal   Collection Time: 11/10/18  9:11 PM  Result Value Ref Range   Sodium 138 135 - 145 mmol/L   Potassium 3.7 3.5 - 5.1 mmol/L   Chloride 101 98 - 111 mmol/L   CO2 26 22 - 32 mmol/L   Glucose, Bld 106 (H) 70 - 99 mg/dL   BUN 30 (H) 8 - 23 mg/dL   Creatinine, Ser 1.86 (H) 0.61 - 1.24 mg/dL   Calcium 8.9 8.9 - 10.3 mg/dL   Total Protein 6.6 6.5 - 8.1 g/dL   Albumin 3.8 3.5 - 5.0 g/dL   AST 13 (L) 15 - 41 U/L   ALT 5 0 - 44 U/L   Alkaline Phosphatase 76 38 - 126 U/L   Total Bilirubin 1.4 (H) 0.3 - 1.2 mg/dL   GFR calc non Af Amer 32 (L) >60 mL/min   GFR calc Af Amer 37 (L) >60 mL/min   Anion gap 11 5 - 15    Comment: Performed at [facility], [address] [place], [place] [address]  Lipase, blood     Status: None   Collection Time: 11/10/18  9:11 PM  Result Value Ref Range   Lipase 30 11 - 51 U/L    Comment: Performed at [facility], [address] [place], [place] [address]  Lactic acid, plasma     Status: None   Collection Time: 11/10/18  9:11 PM  Result Value Ref Range   Lactic Acid, Venous 1.0 0.5 - 1.9 mmol/L    Comment: Performed at [facility], [address] [place], [place] [address]  SARS Coronavirus 2 [facility] order, Performed in [facility] hospital lab) Nasopharyngeal Nasopharyngeal Swab     Status: None   Collection Time: 11/10/18  9:11 PM   Specimen: Nasopharyngeal Swab  Result Value Ref Range   SARS Coronavirus 2 NEGATIVE NEGATIVE    Comment: (NOTE) If result is NEGATIVE SARS-CoV-2 target nucleic acids are NOT DETECTED. The SARS-CoV-2  RNA is generally detectable in upper and lower  respiratory specimens during the acute phase of infection. The lowest  concentration of SARS-CoV-2 viral copies this assay can detect is  250  copies / mL. A negative result does not preclude SARS-CoV-2 infection  and should not be used as the sole basis for treatment or other  patient management decisions.  A negative result may occur with  improper specimen collection / handling, submission of specimen other  than nasopharyngeal swab, presence of viral mutation(s) within the  areas targeted by this assay, and inadequate number of viral copies  (<250 copies / mL). A negative result must be combined with clinical  observations, patient history, and epidemiological information. If result is POSITIVE SARS-CoV-2 target nucleic acids are DETECTED. The SARS-CoV-2 RNA is generally detectable in upper and lower  respiratory specimens dur ing the acute phase of infection.  Positive  results are indicative of active infection with SARS-CoV-2.  Clinical  correlation with patient history and other diagnostic information is  necessary to determine patient infection status.  Positive results do  not rule out bacterial infection or co-infection with other viruses. If result is PRESUMPTIVE POSTIVE SARS-CoV-2 nucleic acids MAY BE PRESENT.   A presumptive positive result was obtained on the submitted specimen  and confirmed on repeat testing.  While 2019 novel coronavirus  (SARS-CoV-2) nucleic acids may be present in the submitted sample  additional confirmatory testing may be necessary for epidemiological  and / or clinical management purposes  to differentiate between  SARS-CoV-2 and other Sarbecovirus currently known to infect humans.  If clinically indicated additional testing with an alternate test  methodology [id number]) is advised. The SARS-CoV-2 RNA is generally  detectable in upper and lower respiratory sp ecimens during the acute  phase of  infection. The expected result is Negative. Fact Sheet for Patients:  StrictlyIdeas.no Fact Sheet for Healthcare Providers: BankingDealers.co.za This test is not yet approved or cleared by the [place] FDA and has been authorized for detection and/or diagnosis of SARS-CoV-2 by FDA under an Emergency Use Authorization (EUA).  This EUA will remain in effect (meaning this test can be used) for the duration of the COVID-19 declaration under Section 564(b)(1) of the Act, 21 U.S.C. section 360bbb-3(b)(1), unless the authorization is terminated or revoked sooner. Performed at [facility], [address] [place], [place] [address]   Urinalysis, Complete w Microscopic     Status: Abnormal   Collection Time: 11/10/18  9:11 PM  Result Value Ref Range   Color, Urine AMBER (A) YELLOW    Comment: BIOCHEMICALS MAY BE AFFECTED BY COLOR   APPearance HAZY (A) CLEAR   Specific Gravity, Urine 1.025 1.005 - 1.030   pH 5.0 5.0 - 8.0   Glucose, UA NEGATIVE NEGATIVE mg/dL   Hgb urine dipstick MODERATE (A) NEGATIVE   Bilirubin Urine NEGATIVE NEGATIVE   Ketones, ur 5 (A) NEGATIVE mg/dL   Protein, ur 30 (A) NEGATIVE mg/dL   Nitrite NEGATIVE NEGATIVE   Leukocytes,Ua NEGATIVE NEGATIVE   RBC / HPF 21-50 0 - 5 RBC/hpf   WBC, UA 0-5 0 - 5 WBC/hpf   Bacteria, UA RARE (A) NONE SEEN   Squamous Epithelial / LPF 0-5 0 - 5   Mucus PRESENT     Comment: Performed at [facility], [address] [place], [place] [address]  APTT     Status: Abnormal   Collection Time: 11/10/18  9:11 PM  Result Value Ref Range   aPTT 38 (H) 24 - 36 seconds    Comment:        IF BASELINE aPTT IS ELEVATED, SUGGEST PATIENT RISK ASSESSMENT BE USED TO DETERMINE APPROPRIATE ANTICOAGULANT  THERAPY. Performed at [facility], [address] [place], [place] [address]   Protime-INR     Status: Abnormal   Collection Time: 11/10/18  9:11 PM  Result Value  Ref Range   Prothrombin Time 16.7 (H) 11.4 - 15.2 seconds   INR 1.4 (H) 0.8 - 1.2    Comment: (NOTE) INR goal varies based on device and disease states. Performed at [facility], [place], [place] [address]    Ct Head Wo Contrast  Result Date: 11/09/2018 CLINICAL DATA:  Patient fell out of bed while sleeping. Hit head on dresser. Laceration back of right head. EXAM: CT HEAD WITHOUT CONTRAST TECHNIQUE: Contiguous axial images were obtained from the base of the skull through the vertex without intravenous contrast. COMPARISON:  None. FINDINGS: Brain: There is no evidence for acute hemorrhage, hydrocephalus, mass lesion, or abnormal extra-axial fluid collection. No definite CT evidence for acute infarction. Diffuse loss of parenchymal volume is consistent with atrophy. Patchy low attenuation in the deep hemispheric and periventricular white matter is nonspecific, but likely reflects chronic microvascular ischemic demyelination. Vascular: No hyperdense vessel or unexpected calcification. Skull: No evidence for fracture. No worrisome lytic or sclerotic lesion. Sinuses/Orbits: The visualized paranasal sinuses and mastoid air cells are clear. Visualized portions of the globes and intraorbital fat are unremarkable. Other: None. IMPRESSION: 1. No acute intracranial abnormality. 2. Atrophy with chronic small vessel white matter ischemic disease. Electronically Signed   By: Misty Hannan M.D.   On: 11/09/2018 13:36   Ct Abdomen Pelvis W Contrast  Result Date: 11/10/2018 CLINICAL DATA:  Nausea, vomiting, fall yesterday EXAM: CT ABDOMEN AND PELVIS WITH CONTRAST TECHNIQUE: Multidetector CT imaging of the abdomen and pelvis was performed using the standard protocol following bolus administration of intravenous contrast. CONTRAST:  25mL OMNIPAQUE IOHEXOL 300 MG/ML  SOLN COMPARISON:  Numerous priors, most recently CT renal colic [id number] FINDINGS: Lower chest: Bandlike areas of basilar  scarring and and atelectasis, with associated subpleural nodular thickening in the left lung base measuring up to 12 mm in size, unchanged from multiple comparisons. Additional scarring in the right middle lobe and lingula. Normal heart size. No pericardial effusion. Atherosclerotic calcification of the coronary arteries. Aortic leaflet calcifications are present. Hepatobiliary: No focal liver abnormality is seen. Patient is post cholecystectomy. Slight prominence of the biliary tree likely related to reservoir effect. No calcified intraductal gallstones. Pancreas: Atrophic appearance of the pancreas. Pancreas is otherwise unremarkable. No peripancreatic inflammation, visible pancreatic lesions or pancreatic ductal dilatation. Spleen: Normal in size without focal abnormality. Adrenals/Urinary Tract: Normal adrenal glands. Bilateral nonobstructing nephrolithiasis is present. No obstructive urolithiasis. Right extrarenal pelvis is similar to prior study. Right ureter passes in close proximity to an atherosclerotic calcification mimicking a ureteral calculus but was present on several prior comparisons (3/59). Redemonstration of the large 8.6 cm fluid attenuation cyst arising from the lower pole right kidney. Small thin peripheral calcification but no other concerning feature (Bosniak II). No concerning renal lesions. There is circumferential bladder wall thickening. Hyperdense mural nodule is noted in the dome of the bladder. Stomach/Bowel: Distal esophagus, stomach and duodenal sweep are unremarkable. No bowel wall thickening or dilatation. No evidence of obstruction. A normal appendix is visualized. There is pancolonic mural thickening and extensive pericolonic hazy stranding most pronounced at the level of the splenic flexure and beyond to the level of the rectum. Vascular/Lymphatic: Atherosclerotic plaque within the normal caliber aorta. There is fusiform dilatation of the proximal aorta measuring up to 3.5 cm.  Fusiform infrarenal abdominal aortic ectasia up to 3.2 cm is present and grossly similar to comparison study. Extensive ostial plaque results in multilevel narrowing, incompletely evaluated on this non angiographic exam. Scattered prominent though nonenlarged lymph nodes are present in the retroperitoneum. Reproductive: Prostatomegaly. Coarse eccentric calcification of the prostate. No concerning abnormalities of the prostate or seminal vesicles. Other: No abdominopelvic free fluid or free gas. No bowel containing hernias. Small fat containing left inguinal hernia. Musculoskeletal: Right femoral intramedullary nail with transcervical cancellous screw transfixing a remote posttraumatic deformity of the right femoral neck. Multilevel degenerative changes are present in the imaged portions of the spine. No acute osseous abnormality or suspicious osseous lesion. Bone mineralization is diffusely diminished. Additional degenerative features are present in the bones of the hands and wrists incidentally imaged on this exam. IMPRESSION: 1. Pancolonic mural thickening and extensive pericolonic hazy stranding, most pronounced at the level of the splenic flexure and beyond to the level of the rectum, consistent with colitis, either infectious or inflammatory in etiology. Vascular etiology is less favored given the diffuse nature. 2. Circumferential bladder wall thickening with hyperdense mural nodule in the dome of the bladder, concerning for bladder neoplasm. Recommend further evaluation with cystoscopy. Furthermore recommend correlation with urinalysis to exclude current cystitis. 3. Fusiform infrarenal abdominal aortic ectasia measuring up to 3.2 cm, grossly similar to comparison study. Recommend followup by ultrasound in 3 years. This recommendation follows ACR consensus guidelines: White Paper of the ACR Incidental Findings Committee II on Vascular Findings. J Am Coll Radiol 2013; 10:789-794. Aortic aneurysm NOS  (ICD10-I71.9) 4. Bilateral nonobstructing nephrolithiasis. Right extrarenal pelvis. 5. Prior ORIF of a right transcervical femoral fracture with intramedullary nail placement. 6. Extensive left basilar scarring with associated nodular thickening, unchanged since 2019. 7. Aortic Atherosclerosis (ICD10-I70.0). Electronically Signed   By: Lovena Le M.D.   On: 11/10/2018 23:58   Dg Chest Port 1 View  Result Date: 11/10/2018 CLINICAL DATA:  Sepsis. Fever. EXAM: PORTABLE CHEST 1 VIEW COMPARISON:  Radiograph 09/11/2018 FINDINGS: Patient is rotated. Grossly unchanged heart size and mediastinal contours. No acute airspace disease. No pulmonary edema, large pleural effusion or pneumothorax. No acute osseous abnormalities are seen. IMPRESSION: Rotated exam without acute finding. Electronically Signed   By: Keith Rake M.D.   On: 11/10/2018 21:46    Pending Labs Unresulted Labs (From admission, onward)    Start     Ordered   11/10/18 2206  Gastrointestinal Panel by PCR , Stool  (Gastrointestinal Panel by PCR, Stool                                                                                                                                                     *Does Not include CLOSTRIDIUM DIFFICILE testing.**If CDIFF testing is needed, select the C Difficile Quick Screen w PCR reflex order below)  Once,  STAT     11/10/18 2205   11/10/18 2101  Blood Culture (routine x 2)  BLOOD CULTURE X 2,   STAT     11/10/18 2101   11/10/18 2101  Urine culture  ONCE - STAT,   STAT     11/10/18 2101   11/10/18 2059  C difficile quick scan w PCR reflex  (C Difficile quick screen w PCR reflex panel)  Once, for 24 hours,   STAT     11/10/18 2059   Signed and Held  Basic metabolic panel  Tomorrow morning,   R     Signed and Held   Signed and Held  CBC  Tomorrow morning,   R     Signed and Held   Signed and Held  TSH  Once,   R     Signed and Held          Vitals/Pain Today's Vitals   11/10/18 2130 11/10/18  2230 11/10/18 2300 11/10/18 2336  BP: (!) 141/65 138/68 (!) 144/67 140/71  Pulse: 75 77 79 80  Resp: 20 17 15 16   Temp:      TempSrc:      SpO2: 97% 96% 98% 97%  Weight:      Height:      PainSc:        Isolation Precautions Enteric precautions (UV disinfection)  Medications Medications  iohexol (OMNIPAQUE) 9 MG/ML oral solution 500 mL (500 mLs Oral Contrast Given 11/10/18 2220)  metroNIDAZOLE (FLAGYL) IVPB 500 mg (has no administration in time range)  cefTRIAXone (ROCEPHIN) 2 g in sodium chloride 0.9 % 100 mL IVPB (has no administration in time range)  sodium chloride 0.9 % bolus 1,000 mL (0 mLs Intravenous Stopped 11/10/18 2348)  sodium chloride 0.9 % bolus 1,000 mL (1,000 mLs Intravenous New Bag/Given 11/10/18 2344)  iohexol (OMNIPAQUE) 300 MG/ML solution 75 mL (75 mLs Intravenous Contrast Given 11/10/18 2321)  metroNIDAZOLE (FLAGYL) IVPB 500 mg (0 mg Intravenous Stopped 11/11/18 0045)  cefTRIAXone (ROCEPHIN) 2 g in sodium chloride 0.9 % 100 mL IVPB (0 g Intravenous Stopped 11/11/18 0045)    Mobility non-ambulatory Moderate fall risk   Focused Assessments Renal Assessment Handoff:   R Recommendations: See Admitting Provider Note  Report given to:   Additional Notes:

## 2018-11-11 NOTE — ED Provider Notes (Signed)
CT abd/pel consistent with colitis. No acute surgical issues appreciated. Also found to have bladder wall thickening and aortic ectasia. Discussed all these findings with patient and family. Currently think colitis likely cause of patient's symptoms. Discussed that we will plan on admission. Discussed other findings and outpatient follow up.   Nance Pear, MD 11/11/18 [phone]

## 2018-11-12 ENCOUNTER — Telehealth: Payer: Self-pay

## 2018-11-12 LAB — BASIC METABOLIC PANEL
Anion gap: 7 (ref 5–15)
BUN: 14 mg/dL (ref 8–23)
CO2: 24 mmol/L (ref 22–32)
Calcium: 8.3 mg/dL — ABNORMAL LOW (ref 8.9–10.3)
Chloride: 110 mmol/L (ref 98–111)
Creatinine, Ser: 1.16 mg/dL (ref 0.61–1.24)
GFR calc Af Amer: >60 mL/min (ref >60)
GFR calc non Af Amer: 57 mL/min — ABNORMAL LOW (ref >60)
Glucose, Bld: 94 mg/dL (ref 70–99)
Potassium: 3.4 mmol/L — ABNORMAL LOW (ref 3.5–5.1)
Sodium: 141 mmol/L (ref 135–145)

## 2018-11-12 LAB — CBC
HCT: 37.3 % — ABNORMAL LOW (ref 39.0–52.0)
Hemoglobin: 12.4 g/dL — ABNORMAL LOW (ref 13.0–17.0)
MCH: 30.5 pg (ref 26.0–34.0)
MCHC: 33.2 g/dL (ref 30.0–36.0)
MCV: 91.6 fL (ref 80.0–100.0)
Platelets: 165 10*3/uL (ref 150–400)
RBC: 4.07 MIL/uL — ABNORMAL LOW (ref 4.22–5.81)
RDW: 12.9 % (ref 11.5–15.5)
WBC: 7.7 10*3/uL (ref 4.0–10.5)
nRBC: 0 % (ref 0.0–0.2)

## 2018-11-12 LAB — MAGNESIUM: Magnesium: 2.2 mg/dL (ref 1.7–2.4)

## 2018-11-12 LAB — URINE CULTURE: Culture: NO GROWTH

## 2018-11-12 LAB — GLUCOSE, CAPILLARY: Glucose-Capillary: 92 mg/dL (ref 70–99)

## 2018-11-12 MED ORDER — POTASSIUM CHLORIDE CRYS ER 20 MEQ PO TBCR
40.0000 meq | EXTENDED_RELEASE_TABLET | Freq: Once | ORAL | Status: AC
  Administered 2018-11-12: 09:00:00 40 meq via ORAL
  Filled 2018-11-12: qty 2

## 2018-11-12 NOTE — Telephone Encounter (Signed)
Spoke to him this morning Diarrhea is better and he feels better overall Just on full liquid diet for now

## 2018-11-12 NOTE — Progress Notes (Signed)
Pt has an IV in the left Ac that occludes when he bends his arm.  Pt upset about IV pump beeping.  Moved IV to right arm IV but pt was dreaming and pulled this IV out. Attempted IV start times one with no success so contacted IV team to start new IV for maintenance fluids. Dorna Bloom RN

## 2018-11-12 NOTE — Telephone Encounter (Signed)
Per chart review tab pt was admitted to [facility] on 11/10/18.

## 2018-11-12 NOTE — Telephone Encounter (Signed)
[place] Night - Client TELEPHONE ADVICE RECORD [facility] Patient Name: Walter [name] Gender: Unknown DOB: 03-May-1931 Age: 85 Y 39 M 11 D Return Phone Number: [phone] (Primary), [phone] (Secondary) Address: City/State/Zip: [place] [address] Client [facility] Night - Client Client Site [place] Physician Walter [name] - MD Contact Type Call Who Is Calling Patient / Member / Family / Caregiver Call Type Triage / Clinical Caller Name Walter [name] Relationship To Patient Spouse Return Phone Number [phone] (Primary) Chief Complaint HEAD INJURY - and not acting right. Change in behaviour after hitting head. Reason for Call Symptomatic / Request for Walter [name] states her husband was seen in ER yesterday for head injury. Patient has diarrhea, fever, chills, and weakness Translation No Nurse Assessment Nurse: Walter Carl, RN, Walter [name] Date/Time Walter [name] Time): 11/10/2018 10:25:02 AM Confirm and document reason for call. If symptomatic, describe symptoms. ---On Friday am, he threw himself out of bed after a dream. He had a laceration. He now has chills, tremors, diarrhea and a temp of 101. The diarrhea is brown and he can't control it at all. He denies any pain. He has not eaten but has had sports drinks. Has the patient had close contact with a person known or suspected to have the novel coronavirus illness OR traveled / lives in area with major community spread (including international travel) in the last 14 days from the onset of symptoms? * If Asymptomatic, screen for exposure and travel within the last 14 days. ---No Does the patient have any new or worsening symptoms? ---Yes Will a triage be completed? ---Yes Related visit to physician within the last 2 weeks? ---Yes Does the PT have any chronic conditions? (i.e. diabetes, asthma, this includes High risk  factors for pregnancy, etc.) ---Yes List chronic conditions. ---He had pulmonary embolism and is on Eloquis. Is this a behavioral health or substance abuse call? ---No Guidelines Guideline Title Affirmed Question Affirmed Notes Nurse Date/Time (Eastern Time) Diarrhea [1] MODERATE diarrhea (e.g., 4-6 times / day more Walter [name] 11/10/2018 10:31:58 AM PLEASE NOTE: All timestamps contained within this report are represented as [place] Standard Time. CONFIDENTIALTY NOTICE: This fax transmission is intended only for the addressee. It contains information that is legally privileged, confidential or otherwise protected from use or disclosure. If you are not the intended recipient, you are strictly prohibited from reviewing, disclosing, copying using or disseminating any of this information or taking any action in reliance on or regarding this information. If you have received this fax in error, please notify us immediately by telephone so that we can arrange for its return to [place]. Phone: [phone], Toll-Free: [phone], Fax: [phone] Page: 2 of 2 Call Id: [id number] Guidelines Guideline Title Affirmed Question Affirmed Notes Nurse Date/Time Walter [name] Time) than normal) AND [2] age > 62 years 3. Time Walter [name] Time) Disposition Final User 11/10/2018 10:22:51 AM Send to Urgent [place], [place] 11/10/2018 10:36:06 AM See PCP within 24 Hours Yes Walter Carl, RN, Walter [name] Disagree/Comply Comply Caller Understands Yes PreDisposition Call Doctor Care Advice Given Per Guideline SEE PCP WITHIN 24 HOURS: * IF OFFICE WILL BE CLOSED AND PCP SECOND-LEVEL TRIAGE REQUIRED: You may need to be seen within the next 24 hours. Your doctor (or NP/PA) will want to talk with you to decide what's best. I'll page the oncall provider now. NOTE: Since this isn't serious, hold the page between 10 pm and 7 am. Page the on-call provider  in the morning. FLUID THERAPY DURING SEVERE DIARRHEA: * SPORTS  DRINKS: You can also drink a sports drink (e.g., Gatorade, Powerade) to help treat and prevent dehydration. For it to work best, mix it [name] and [name] with water. * WATER: Even for severe diarrhea, water is often the best liquid to drink. You should also eat some salty foods (e.g., potato chips, pretzels, saltine crackers). This is important to make sure you are getting enough salt, sugars, and fluids to meet your body's needs. FOOD AND NUTRITION DURING SEVERE DIARRHEA: * Drinking enough liquids is more important that eating when one has severe diarrhea. * As the diarrhea starts to get better, you can slowly return to a normal diet. * Begin with boiled starches / cereals (e.g., potatoes, rice, noodles, wheat, oats) with a small amount of salt to taste. * Other foods that are OK include: bananas, yogurt, crackers, soup. CONTAGIOUSNESS: * Be certain to wash your hands after using the restroom. * If your work is cooking, Research scientist (medical), serving or preparing food, then you should not work until the diarrhea has completely stopped. CALL BACK IF: * Signs of dehydration occur (e.g., no urine over 12 hours, very dry mouth, lightheaded, etc.) * Bloody stools * Constant or severe abdominal pain * You become worse. CARE ADVICE given per Diarrhea (Adult) guideline. Comments User: Walter Antu, RN Date/Time Walter [name] Time): 11/10/2018 10:38:49 AM They were referred to the Virtual Visit phone number with Dr. Diona [name]. [phone]. His wife does not want to take him back to the hospital if at all possible. Referrals REFERRED TO PCP OFFICE

## 2018-11-12 NOTE — Evaluation (Signed)
Physical Therapy Evaluation Patient Details Name: Walter [name] MRN: [id number] DOB: 01-Feb-1932 Today's Date: 11/12/2018   History of Present Illness  Walter [name] is an 69yoM who comes to [facility] on 9/8 he has had persistent, watery, diarrhea for several weeks, failed ABX as an outpatient. Pt denies fever, but is febrile in ED. Pt being admitted for colitis. PMH: parkinson's disease, abdominal aortic aneurysm, hypothyroidism, history of DVT, fall in April with hip fracture and subsequent IM nailing.  Clinical Impression  Pt admitted with above diagnosis. Pt currently with functional limitations due to the deficits listed below (see "PT Problem List"). Upon entry, pt in bed, awake and agreeable to participate. The pt is alert and oriented x4, pleasant, conversational, and generally a good historian. Mobility appears to be most limited by moderate to advanced parkinsonian tendencies (difficulty with initiation, bradykinesia, and postural rigidity) however these appear to be made moderately worse by acutely weakened state which pt corroborates. ModA required for supine to EOB, min-modA to rise with RW to standing. Pt does not feel strong enough to attempt AMB at this time. He is agreeable that rehab option at SNF would allow for quicked return to baseline function, as he expresses frustration with slow recovery from hip nailing after DC to home with HHPT in April. Functional mobility assessment demonstrates increased effort/time requirements, poor tolerance, and need for physical assistance, whereas the patient performed these at a higher level of independence PTA. At patient has limited available assistance at home, his safety and mobility needs would be better met at SNF until his independence rebounds. Pt will benefit from skilled PT intervention to increase independence and safety with basic mobility in preparation for discharge to the venue listed below.      Follow Up Recommendations SNF;Supervision for  mobility/OOB;Supervision - Intermittent    Equipment Recommendations  None recommended by PT    Recommendations for Other Services       Precautions / Restrictions Precautions Precautions: Fall Restrictions Weight Bearing Restrictions: No      Mobility  Bed Mobility Overal bed mobility: Needs Assistance Bed Mobility: Supine to Sit     Supine to sit: Mod assist     General bed mobility comments: wants to attempt without assist, but due to weakness and moreso inititaion difficulty and bradykinesia, requires modA for legs and trunk. Pt able to use BUE to pull self forward.  Transfers Overall transfer level: Needs assistance Equipment used: Rolling walker (2 wheeled) Transfers: Sit to/from [facility] Sit to Stand: Mod assist Stand pivot transfers: Supervision(bradykinetic, good sequencing, slow movements, excellent control of descent.)       General transfer comment: bradykinesia with consistent slow force production; does not rise to full upright stance (likely a chronic parkinsonian posture)  Ambulation/Gait Ambulation/Gait assistance: (deferred due to weakness, but likely would have tolerated single digit distance. Pt does not endorse confidence at this time.)              Stairs            Wheelchair Mobility    Modified Rankin (Stroke Patients Only)       Balance Overall balance assessment: Modified Independent                                           Pertinent Vitals/Pain Pain Assessment: No/denies pain    Home Living Family/patient expects to be discharged  to:: Private residence Living Arrangements: Spouse/significant other Available Help at Discharge: Family Type of Home: ([facility]) Home Access: Level entry     [place]: [place]: [place] - single point;Walker - 2 wheels;Shower seat      Prior Function           Comments: Needs some assistance for ADL dressing and  shower, 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";     Hand Dominance   Dominant Hand: Right    Extremity/Trunk Assessment   Upper Extremity Assessment Upper Extremity Assessment: Overall WFL for tasks assessed    Lower Extremity Assessment Lower Extremity Assessment: Overall WFL for tasks assessed    Cervical / Trunk Assessment Cervical / Trunk Assessment: (forward flexed posture.)  Communication   Communication: No difficulties(1 falls OOB 2WA, another fall in April with hip fracture)  Cognition Arousal/Alertness: Awake/alert Behavior During Therapy: WFL for tasks assessed/performed;Flat affect Overall Cognitive Status: Within Functional Limits for tasks assessed                                        General Comments      Exercises     Assessment/Plan    PT Assessment Patient needs continued PT services  PT Problem List Decreased strength;Decreased range of motion;Decreased activity tolerance;Decreased balance;Decreased mobility       PT Treatment Interventions DME instruction;Balance training;Gait training;Functional mobility training;Therapeutic activities;Therapeutic exercise    PT Goals (Current goals can be found in the Care Plan section)  Acute Rehab PT Goals Patient Stated Goal: regain strength and not burden his wife upon DC PT Goal Formulation: With patient Time For Goal Achievement: 11/26/18 Potential to Achieve Goals: Good    Frequency Min 2X/week   Barriers to discharge Decreased caregiver support wife cannot provide much assist and pt needs physical assist for mobility at this time.    Co-evaluation               AM-PAC PT "6 Clicks" Mobility  Outcome Measure Help needed turning from your back to your side while in a flat bed without using bedrails?: A Lot Help needed moving from lying on your back to sitting on the side of a flat bed without using bedrails?: A Lot Help needed moving to and from a bed to a  chair (including a wheelchair)?: A Lot Help needed standing up from a chair using your arms (e.g., wheelchair or bedside chair)?: A Lot Help needed to walk in hospital room?: A Lot Help needed climbing 3-5 steps with a railing? : A Lot 6 Click Score: 12    End of Session Equipment Utilized During Treatment: Gait belt Activity Tolerance: Patient tolerated treatment well;No increased pain;Patient limited by lethargy;Patient limited by fatigue Patient left: in chair;with family/visitor present;with call bell/phone within reach Nurse Communication: Mobility status PT Visit Diagnosis: Other abnormalities of gait and mobility (R26.89);Muscle weakness (generalized) (M62.81);History of falling (Z91.81);Difficulty in walking, not elsewhere classified (R26.2);Other symptoms and signs involving the nervous system (R29.898)    Time: 1962-2297 PT Time Calculation (min) (ACUTE ONLY): 30 min   Charges:   PT Evaluation $PT Eval Moderate Complexity: 1 Mod PT Treatments $Therapeutic Exercise: 8-22 mins       4:47 PM, 11/12/18 Etta Grandchild, PT, DPT Physical Therapist - [facility]  [phone] ([place])     Adams C 11/12/2018,  4:43 PM

## 2018-11-12 NOTE — Progress Notes (Signed)
[facility] NAME: Walter [name]    MR#:  [id number]  DATE OF BIRTH:  [birth year]  SUBJECTIVE:  CHIEF COMPLAINT:   Chief Complaint  Patient presents with  . Diarrhea   No new complaint this morning.  Patient denies any abdominal pains.  Diarrhea appears resolved.    REVIEW OF SYSTEMS:  Review of Systems  Constitutional: Negative for chills and fever.  HENT: Negative for hearing loss and tinnitus.   Eyes: Negative for blurred vision and double vision.  Respiratory: Negative for cough and shortness of breath.   Cardiovascular: Negative for chest pain and orthopnea.  Gastrointestinal: Negative for diarrhea, nausea and vomiting.  Genitourinary: Negative for dysuria and urgency.  Musculoskeletal: Negative for myalgias and neck pain.  Skin: Negative for itching and rash.  Neurological: Negative for dizziness and headaches.  Psychiatric/Behavioral: Negative for depression and hallucinations.    DRUG ALLERGIES:   Allergies  Allergen Reactions  . Quinolones     tendonitis   VITALS:  Blood pressure 129/69, pulse 68, temperature 97.8 F (36.6 C), temperature source Oral, resp. rate 18, height 5\' 10"  (1.778 m), weight 78.5 kg, SpO2 93 %. PHYSICAL EXAMINATION:  Physical Exam  GENERAL:  85 y.o.-year-old patient lying in the bed with no acute distress.  EYES: Pupils equal, round, reactive to light and accommodation. No scleral icterus. Extraocular muscles intact.  HEENT: Head atraumatic, normocephalic. Oropharynx and nasopharynx clear.  NECK:  Supple, no jugular venous distention. No thyroid enlargement, no tenderness.  LUNGS: Normal breath sounds bilaterally, no wheezing, rales,rhonchi or crepitation. No use of accessory muscles of respiration.  CARDIOVASCULAR: S1, S2 normal. No murmurs, rubs, or gallops.  ABDOMEN: Soft, nontender, nondistended. Bowel sounds present. No organomegaly or mass.  EXTREMITIES: No pedal edema, cyanosis, or  clubbing.  NEUROLOGIC: Moving all extremities with no focal deficit.  Sensation intact. Gait not checked.  PSYCHIATRIC: The patient is alert and oriented x 3.  SKIN: No obvious rash, lesion, or ulcer.  LABORATORY PANEL:  Male CBC Recent Labs  Lab 11/12/18 0431  WBC 7.7  HGB 12.4*  HCT 37.3*  PLT 165   ------------------------------------------------------------------------------------------------------------------ Chemistries  Recent Labs  Lab 11/10/18 2111  11/12/18 0431  NA 138   < > 141  K 3.7   < > 3.4*  CL 101   < > 110  CO2 26   < > 24  GLUCOSE 106*   < > 94  BUN 30*   < > 14  CREATININE 1.86*   < > 1.16  CALCIUM 8.9   < > 8.3*  MG  --   --  2.2  AST 13*  --   --   ALT 5  --   --   ALKPHOS 76  --   --   BILITOT 1.4*  --   --    < > = values in this interval not displayed.   RADIOLOGY:  No results found. ASSESSMENT AND PLAN:   85 year old male admitted for C. difficile colitis  *C. difficile colitis Patient currently on p.o. vancomycin.  Leukocytosis resolved Diarrhea appear resolved.  No diarrhea reported this morning. IV fluid hydration.  Monitor clinically.  *Acute renal failure Patient presented with acute kidney injury superimposed on chronic kidney disease stage III Renal function improving with IV fluid hydration. -Hydrate with IV fluids and monitor kidney function.  Likely prerenal from dehydration due to diarrhea  *Hypothyroidism -TSH of 0.569 , continue Synthroid  *History  of DVT/PE -Continue Eliquis  *Debility with generalized weakness from multiple medical problems including recent diarrheas Physical therapy to evaluate and treat.  Patient may require rehab placement but let see how patient does with physical therapy evaluation  *Hypokalemia; replaced.  Follow-up on repeat levels in a.m.  DVT prophylaxis; patient already on Eliquis.  All the records are reviewed and case discussed with Care Management/Social Worker. Management  plans discussed with the patient, family and they are in agreement.  CODE STATUS: Full Code  TOTAL TIME TAKING CARE OF THIS PATIENT: 34 minutes.   More than 50% of the time was spent in counseling/coordination of care: YES  POSSIBLE D/C IN 2 DAYS, DEPENDING ON CLINICAL CONDITION.   Walter [name] M.D on 11/12/2018 at 2:43 PM  Between 7am to 6pm - Pager - [phone]  After 6pm go to [email or web address] - Proofreader  [facility]  Office  [phone]  CC: Primary care physician; Venia Carbon, MD  Note: This dictation was prepared with Dragon dictation along with smaller phrase technology. Any transcriptional errors that result from this process are unintentional.

## 2018-11-13 DIAGNOSIS — A0472 Enterocolitis due to Clostridium difficile, not specified as recurrent: Secondary | ICD-10-CM | POA: Diagnosis not present

## 2018-11-13 DIAGNOSIS — R531 Weakness: Secondary | ICD-10-CM | POA: Diagnosis not present

## 2018-11-13 DIAGNOSIS — A0471 Enterocolitis due to Clostridium difficile, recurrent: Secondary | ICD-10-CM | POA: Diagnosis not present

## 2018-11-13 DIAGNOSIS — K529 Noninfective gastroenteritis and colitis, unspecified: Secondary | ICD-10-CM | POA: Diagnosis not present

## 2018-11-13 DIAGNOSIS — I10 Essential (primary) hypertension: Secondary | ICD-10-CM | POA: Diagnosis not present

## 2018-11-13 DIAGNOSIS — I2692 Saddle embolus of pulmonary artery without acute cor pulmonale: Secondary | ICD-10-CM | POA: Diagnosis not present

## 2018-11-13 DIAGNOSIS — I2782 Chronic pulmonary embolism: Secondary | ICD-10-CM | POA: Diagnosis not present

## 2018-11-13 DIAGNOSIS — N179 Acute kidney failure, unspecified: Secondary | ICD-10-CM | POA: Diagnosis not present

## 2018-11-13 DIAGNOSIS — W06XXXD Fall from bed, subsequent encounter: Secondary | ICD-10-CM | POA: Diagnosis not present

## 2018-11-13 DIAGNOSIS — S98131A Complete traumatic amputation of one right lesser toe, initial encounter: Secondary | ICD-10-CM | POA: Diagnosis not present

## 2018-11-13 DIAGNOSIS — S0101XD Laceration without foreign body of scalp, subsequent encounter: Secondary | ICD-10-CM | POA: Diagnosis not present

## 2018-11-13 DIAGNOSIS — R404 Transient alteration of awareness: Secondary | ICD-10-CM | POA: Diagnosis not present

## 2018-11-13 DIAGNOSIS — N183 Chronic kidney disease, stage 3 (moderate): Secondary | ICD-10-CM | POA: Diagnosis not present

## 2018-11-13 DIAGNOSIS — Z86718 Personal history of other venous thrombosis and embolism: Secondary | ICD-10-CM | POA: Diagnosis not present

## 2018-11-13 DIAGNOSIS — Z7401 Bed confinement status: Secondary | ICD-10-CM | POA: Diagnosis not present

## 2018-11-13 DIAGNOSIS — I129 Hypertensive chronic kidney disease with stage 1 through stage 4 chronic kidney disease, or unspecified chronic kidney disease: Secondary | ICD-10-CM | POA: Diagnosis not present

## 2018-11-13 DIAGNOSIS — M2550 Pain in unspecified joint: Secondary | ICD-10-CM | POA: Diagnosis not present

## 2018-11-13 DIAGNOSIS — E039 Hypothyroidism, unspecified: Secondary | ICD-10-CM | POA: Diagnosis not present

## 2018-11-13 DIAGNOSIS — G20 Parkinson's disease: Secondary | ICD-10-CM | POA: Diagnosis not present

## 2018-11-13 LAB — BASIC METABOLIC PANEL
Anion gap: 6 (ref 5–15)
BUN: 10 mg/dL (ref 8–23)
CO2: 25 mmol/L (ref 22–32)
Calcium: 8.3 mg/dL — ABNORMAL LOW (ref 8.9–10.3)
Chloride: 110 mmol/L (ref 98–111)
Creatinine, Ser: 1.02 mg/dL (ref 0.61–1.24)
GFR calc Af Amer: >60 mL/min (ref >60)
GFR calc non Af Amer: >60 mL/min (ref >60)
Glucose, Bld: 99 mg/dL (ref 70–99)
Potassium: 3.6 mmol/L (ref 3.5–5.1)
Sodium: 141 mmol/L (ref 135–145)

## 2018-11-13 LAB — MAGNESIUM: Magnesium: 2.1 mg/dL (ref 1.7–2.4)

## 2018-11-13 LAB — GLUCOSE, CAPILLARY: Glucose-Capillary: 89 mg/dL (ref 70–99)

## 2018-11-13 MED ORDER — AMLODIPINE BESYLATE 5 MG PO TABS
5.0000 mg | ORAL_TABLET | Freq: Every day | ORAL | Status: DC
  Administered 2018-11-13: 5 mg via ORAL
  Filled 2018-11-13: qty 1

## 2018-11-13 MED ORDER — VANCOMYCIN 50 MG/ML ORAL SOLUTION: 125.0000 mg | Freq: Four times a day (QID) | ORAL | 0 refills | Status: AC

## 2018-11-13 NOTE — Progress Notes (Signed)
Patient discharged to [facility]. Report called to [place] at facility. EMS called for transport.

## 2018-11-13 NOTE — TOC Initial Note (Signed)
Transition of Care [facility]) - Initial/Assessment Note    Patient Details  Name: Walter [name] MRN: [id number] Date of Birth: [birth year]  Transition of Care [facility]) CM/SW Contact:    Shelbie Hutching, RN Phone Number: 11/13/2018, 8:53 AM  Clinical Narrative:                 Patient admitted with colitis, found to have c diff.  PT has worked with patient and recommended SNF.  Patient is from [facility] independent living with his wife and has been to their SNF in the past after a hip fracture.  Patient is agreeable to go back to SNF.  Message left with social worker at [facility] for return call.   Expected Discharge Plan: Skilled Nursing Facility Barriers to Discharge: Continued Medical Work up   Patient Goals and CMS Choice   CMS Medicare.gov Compare Post Acute Care list provided to:: Patient Choice offered to / list presented to : Patient  Expected Discharge Plan and Services Expected Discharge Plan: [place]   Discharge Planning Services: CM Consult Post Acute Care Choice: [place] Living arrangements for the past 2 months: [place] Expected Discharge Date: 11/13/18                                    Prior Living Arrangements/Services Living arrangements for the past 2 months: [place] Lives with:: Spouse Patient language and need for interpreter reviewed:: No Do you feel safe going back to the place where you live?: Yes      Need for Family Participation in Patient Care: Yes (Comment)(colitis) Care giver support system in place?: Yes (comment)(wife)   Criminal Activity/Legal Involvement Pertinent to Current Situation/Hospitalization: No - Comment as needed  Activities of Daily Living Home Assistive Devices/Equipment: Walker (specify type) ADL Screening (condition at time of admission) Patient's cognitive ability adequate to safely complete daily activities?: Yes Is the patient deaf or have  difficulty hearing?: No Does the patient have difficulty seeing, even when wearing glasses/contacts?: No Does the patient have difficulty concentrating, remembering, or making decisions?: No Patient able to express need for assistance with ADLs?: Yes Does the patient have difficulty dressing or bathing?: Yes Independently performs ADLs?: Yes (appropriate for developmental age) Does the patient have difficulty walking or climbing stairs?: Yes Weakness of Legs: Both Weakness of Arms/Hands: None  Permission Sought/Granted Permission sought to share information with : Case Manager, Customer service manager, Family Supports Permission granted to share information with : Yes, Verbal Permission Granted     Permission granted to share info w AGENCY: [place] granted to share info w Relationship: Wife     Emotional Assessment Appearance:: Appears stated age Attitude/Demeanor/Rapport: Engaged Affect (typically observed): Accepting Orientation: : Oriented to Self, Oriented to Place, Oriented to  Time, Oriented to Situation Alcohol / Substance Use: Not Applicable Psych Involvement: No (comment)  Admission diagnosis:  Diarrhea of presumed infectious origin [R19.7] AKI (acute kidney injury) ([place]) [N17.9] Fever in adult [R50.9] Other elevated white blood cell (WBC) count [D72.828] Patient Active Problem List   Diagnosis Date Noted  . Colitis 11/11/2018  . Pressure injury of skin 11/11/2018  . Oropharyngeal dysphagia 10/02/2018  . Heat exhaustion 09/14/2018  . Abdominal aortic aneurysm (AAA) ([place])   . RBD (REM behavioral disorder) 10/24/2017  . Advance directive discussed with patient 11/01/2016  . Renal cyst, right 06/06/2016  . Acute  deep vein thrombosis (DVT) of popliteal vein of right lower extremity ([place]) 04/28/2016  . CKD (chronic kidney disease) stage 3, GFR 30-59 ml/min (HCC) 03/05/2016  . Pulmonary embolism ([place]) 03/03/2016  . Vitamin B12 deficiency 09/09/2014   . Preventative health care 08/04/2014  . Parkinson's disease ([place]) 08/04/2014  . Esophageal diverticulum 01/01/2014  . Actinic keratosis 07/31/2013  . Hypertension   . Osteoarthritis of both knees   . Toe amputation status   . Benign prostatic hyperplasia   . Kidney stones   . Hypothyroid   . Tendonitis    PCP:  Venia Carbon, MD Pharmacy:   [facility], [address] [place] [address] Phone: [phone] Fax: [phone]     Social Determinants of Health (SDOH) Interventions    Readmission Risk Interventions No flowsheet data found.

## 2018-11-13 NOTE — TOC Transition Note (Signed)
Transition of Care [facility]) - CM/SW Discharge Note   Patient Details  Name: Walter [name] MRN: [id number] Date of Birth: May 30, 1931  [facility]) CM/SW Contact:  Shelbie Hutching, RN Phone Number: 11/13/2018, 12:19 PM   Clinical Narrative:    [facility] by [facility], insurance authorization approved per SNF.  Patient will transport via [place] and go to room 229.  Bedside RN will call report to [phone].    Final next level of care: Skilled Nursing Facility Barriers to Discharge: Barriers Resolved   Patient Goals and CMS Choice   CMS Medicare.gov Compare Post Acute Care list provided to:: Patient Choice offered to / list presented to : Patient  Discharge Placement              Patient [facility] bed at: [facility] Patient to be transferred to facility by:  EMS Name of family member notified: Taraj Yoffe Patient and family notified of of transfer: 11/13/18  Discharge Plan and Services   Discharge Planning Services: CM Consult Post Acute Care Choice: [place]                               Social Determinants of Health (SDOH) Interventions     Readmission Risk Interventions No flowsheet data found.

## 2018-11-13 NOTE — TOC Progression Note (Signed)
Transition of Care [facility]) - Progression Note    Patient Details  Name: Brooke Barbour MRN: [id number] Date of Birth: 1931-10-03  Transition of Care [facility]) CM/SW Contact  Shelbie Hutching, RN Phone Number: 11/13/2018, 10:22 AM  Clinical Narrative:    [facility] patient and they will start authorization as Mcarthur Rossetti is not a Product manager.  When Auth is received patient can go to room 229 and bedside RN can call report to [phone].   Expected Discharge Plan: [place] Barriers to Discharge: Continued Medical Work up  Expected Discharge Plan and Services Expected Discharge Plan: [place]   Discharge Planning Services: CM Consult Post Acute Care Choice: [place] Living arrangements for the past 2 months: [place] Expected Discharge Date: 11/13/18                                     Social Determinants of Health (SDOH) Interventions    Readmission Risk Interventions No flowsheet data found.

## 2018-11-13 NOTE — Discharge Summary (Signed)
[place] [facility], 85 y.o., DOB [birth year], MRN [id number]. Admission date: 11/10/2018 Discharge Date 11/13/2018 Primary MD Venia Carbon, MD Admitting Physician Max Sane, MD  Admission Diagnosis  Diarrhea of presumed infectious origin [R19.7] AKI (acute kidney injury) ([place]) [N17.9] Fever in adult [R50.9] Other elevated white blood cell (WBC) count [D72.828]  Discharge Diagnosis       C. difficile colitis Acute renal failure Hypothyroidism History of DVT/PE Generalized weakness Hypokalemia  Hospital Course  Patient is 85 year old with previous history of C. difficile colitis last month who presented with diarrhea and fever.  Again patient had recurrence of C. difficile colitis.  He was admitted started on vancomycin and given IV fluids.  With these treatments his symptoms resolved.  Patient will be treated for 14 days with vancomycin during this time.  If he has recurrence of C. difficile would consider changing him to Dificid as well as referral to outpatient GI physician for: Stool transplant.            Consults  None  Significant Tests:  See full reports for all details     Ct Head Wo Contrast  Result Date: 11/09/2018 CLINICAL DATA:  Patient fell out of bed while sleeping. Hit head on dresser. Laceration back of right head. EXAM: CT HEAD WITHOUT CONTRAST TECHNIQUE: Contiguous axial images were obtained from the base of the skull through the vertex without intravenous contrast. COMPARISON:  None. FINDINGS: Brain: There is no evidence for acute hemorrhage, hydrocephalus, mass lesion, or abnormal extra-axial fluid collection. No definite CT evidence for acute infarction. Diffuse loss of parenchymal volume is consistent with atrophy. Patchy low attenuation in the deep hemispheric and periventricular white matter is nonspecific, but likely reflects chronic microvascular ischemic demyelination. Vascular: No hyperdense vessel or  unexpected calcification. Skull: No evidence for fracture. No worrisome lytic or sclerotic lesion. Sinuses/Orbits: The visualized paranasal sinuses and mastoid air cells are clear. Visualized portions of the globes and intraorbital fat are unremarkable. Other: None. IMPRESSION: 1. No acute intracranial abnormality. 2. Atrophy with chronic small vessel white matter ischemic disease. Electronically Signed   By: Misty Ngoc M.D.   On: 11/09/2018 13:36   Ct Abdomen Pelvis W Contrast  Result Date: 11/10/2018 CLINICAL DATA:  Nausea, vomiting, fall yesterday EXAM: CT ABDOMEN AND PELVIS WITH CONTRAST TECHNIQUE: Multidetector CT imaging of the abdomen and pelvis was performed using the standard protocol following bolus administration of intravenous contrast. CONTRAST:  85mL OMNIPAQUE IOHEXOL 300 MG/ML  SOLN COMPARISON:  Numerous priors, most recently CT renal colic [id number] FINDINGS: Lower chest: Bandlike areas of basilar scarring and and atelectasis, with associated subpleural nodular thickening in the left lung base measuring up to 12 mm in size, unchanged from multiple comparisons. Additional scarring in the right middle lobe and lingula. Normal heart size. No pericardial effusion. Atherosclerotic calcification of the coronary arteries. Aortic leaflet calcifications are present. Hepatobiliary: No focal liver abnormality is seen. Patient is post cholecystectomy. Slight prominence of the biliary tree likely related to reservoir effect. No calcified intraductal gallstones. Pancreas: Atrophic appearance of the pancreas. Pancreas is otherwise unremarkable. No peripancreatic inflammation, visible pancreatic lesions or pancreatic ductal dilatation. Spleen: Normal in size without focal abnormality. Adrenals/Urinary Tract: Normal adrenal glands. Bilateral nonobstructing nephrolithiasis is present. No obstructive urolithiasis. Right extrarenal pelvis is similar to prior study. Right ureter passes in close proximity to an  atherosclerotic calcification mimicking a ureteral calculus but was present on several prior comparisons (3/59). Redemonstration of the  large 8.6 cm fluid attenuation cyst arising from the lower pole right kidney. Small thin peripheral calcification but no other concerning feature (Bosniak II). No concerning renal lesions. There is circumferential bladder wall thickening. Hyperdense mural nodule is noted in the dome of the bladder. Stomach/Bowel: Distal esophagus, stomach and duodenal sweep are unremarkable. No bowel wall thickening or dilatation. No evidence of obstruction. A normal appendix is visualized. There is pancolonic mural thickening and extensive pericolonic hazy stranding most pronounced at the level of the splenic flexure and beyond to the level of the rectum. Vascular/Lymphatic: Atherosclerotic plaque within the normal caliber aorta. There is fusiform dilatation of the proximal aorta measuring up to 3.5 cm. Fusiform infrarenal abdominal aortic ectasia up to 3.2 cm is present and grossly similar to comparison study. Extensive ostial plaque results in multilevel narrowing, incompletely evaluated on this non angiographic exam. Scattered prominent though nonenlarged lymph nodes are present in the retroperitoneum. Reproductive: Prostatomegaly. Coarse eccentric calcification of the prostate. No concerning abnormalities of the prostate or seminal vesicles. Other: No abdominopelvic free fluid or free gas. No bowel containing hernias. Small fat containing left inguinal hernia. Musculoskeletal: Right femoral intramedullary nail with transcervical cancellous screw transfixing a remote posttraumatic deformity of the right femoral neck. Multilevel degenerative changes are present in the imaged portions of the spine. No acute osseous abnormality or suspicious osseous lesion. Bone mineralization is diffusely diminished. Additional degenerative features are present in the bones of the hands and wrists incidentally  imaged on this exam. IMPRESSION: 1. Pancolonic mural thickening and extensive pericolonic hazy stranding, most pronounced at the level of the splenic flexure and beyond to the level of the rectum, consistent with colitis, either infectious or inflammatory in etiology. Vascular etiology is less favored given the diffuse nature. 2. Circumferential bladder wall thickening with hyperdense mural nodule in the dome of the bladder, concerning for bladder neoplasm. Recommend further evaluation with cystoscopy. Furthermore recommend correlation with urinalysis to exclude current cystitis. 3. Fusiform infrarenal abdominal aortic ectasia measuring up to 3.2 cm, grossly similar to comparison study. Recommend followup by ultrasound in 3 years. This recommendation follows ACR consensus guidelines: White Paper of the ACR Incidental Findings Committee II on Vascular Findings. J Am Coll Radiol 2013; 10:789-794. Aortic aneurysm NOS (ICD10-I71.9) 4. Bilateral nonobstructing nephrolithiasis. Right extrarenal pelvis. 5. Prior ORIF of a right transcervical femoral fracture with intramedullary nail placement. 6. Extensive left basilar scarring with associated nodular thickening, unchanged since 2019. 7. Aortic Atherosclerosis (ICD10-I70.0). Electronically Signed   By: Lovena Le M.D.   On: 11/10/2018 23:58   Dg Chest Port 1 View  Result Date: 11/10/2018 CLINICAL DATA:  Sepsis. Fever. EXAM: PORTABLE CHEST 1 VIEW COMPARISON:  Radiograph 09/11/2018 FINDINGS: Patient is rotated. Grossly unchanged heart size and mediastinal contours. No acute airspace disease. No pulmonary edema, large pleural effusion or pneumothorax. No acute osseous abnormalities are seen. IMPRESSION: Rotated exam without acute finding. Electronically Signed   By: Keith Rake M.D.   On: 11/10/2018 21:46       Today   Subjective:   Walter [name] patient denies any symptoms feeling better.  Diarrhea is resolved Objective:   Blood pressure (!) 150/76,  pulse 66, temperature (!) 97.4 F (36.3 C), temperature source Oral, resp. rate 17, height 5\' 10"  (1.778 m), weight 78.5 kg, SpO2 95 %.  .  Intake/Output Summary (Last 24 hours) at 11/13/2018 1149 Last data filed at 11/13/2018 1028 Gross per 24 hour  Intake 1321.4 ml  Output 3601 ml  Net -2279.6 ml  Exam VITAL SIGNS: Blood pressure (!) 150/76, pulse 66, temperature (!) 97.4 F (36.3 C), temperature source Oral, resp. rate 17, height 5\' 10"  (1.778 m), weight 78.5 kg, SpO2 95 %.  GENERAL:  85 y.o.-year-old patient lying in the bed with no acute distress.  EYES: Pupils equal, round, reactive to light and accommodation. No scleral icterus. Extraocular muscles intact.  HEENT: Head atraumatic, normocephalic. Oropharynx and nasopharynx clear.  NECK:  Supple, no jugular venous distention. No thyroid enlargement, no tenderness.  LUNGS: Normal breath sounds bilaterally, no wheezing, rales,rhonchi or crepitation. No use of accessory muscles of respiration.  CARDIOVASCULAR: S1, S2 normal. No murmurs, rubs, or gallops.  ABDOMEN: Soft, nontender, nondistended. Bowel sounds present. No organomegaly or mass.  EXTREMITIES: No pedal edema, cyanosis, or clubbing.  NEUROLOGIC: Cranial nerves II through XII are intact. Muscle strength 5/5 in all extremities. Sensation intact. Gait not checked.  PSYCHIATRIC: The patient is alert and oriented x 3.  SKIN: No obvious rash, lesion, or ulcer.   Data Review     CBC w Diff:  Lab Results  Component Value Date   WBC 7.7 11/12/2018   HGB 12.4 (L) 11/12/2018   HCT 37.3 (L) 11/12/2018   PLT 165 11/12/2018   LYMPHOPCT 5 11/10/2018   MONOPCT 10 11/10/2018   EOSPCT 0 11/10/2018   BASOPCT 0 11/10/2018   CMP:  Lab Results  Component Value Date   NA 141 11/13/2018   K 3.6 11/13/2018   CL 110 11/13/2018   CO2 25 11/13/2018   BUN 10 11/13/2018   CREATININE 1.02 11/13/2018   PROT 6.6 11/10/2018   ALBUMIN 3.8 11/10/2018   BILITOT 1.4 (H) 11/10/2018    ALKPHOS 76 11/10/2018   AST 13 (L) 11/10/2018   ALT 5 11/10/2018  .  Micro Results Recent Results (from the past 240 hour(s))  SARS Coronavirus 2 [facility] order, Performed in [facility] hospital lab) Nasopharyngeal Nasopharyngeal Swab     Status: None   Collection Time: 11/10/18  9:11 PM   Specimen: Nasopharyngeal Swab  Result Value Ref Range Status   SARS Coronavirus 2 NEGATIVE NEGATIVE Final    Comment: (NOTE) If result is NEGATIVE SARS-CoV-2 target nucleic acids are NOT DETECTED. The SARS-CoV-2 RNA is generally detectable in upper and lower  respiratory specimens during the acute phase of infection. The lowest  concentration of SARS-CoV-2 viral copies this assay can detect is 250  copies / mL. A negative result does not preclude SARS-CoV-2 infection  and should not be used as the sole basis for treatment or other  patient management decisions.  A negative result may occur with  improper specimen collection / handling, submission of specimen other  than nasopharyngeal swab, presence of viral mutation(s) within the  areas targeted by this assay, and inadequate number of viral copies  (<250 copies / mL). A negative result must be combined with clinical  observations, patient history, and epidemiological information. If result is POSITIVE SARS-CoV-2 target nucleic acids are DETECTED. The SARS-CoV-2 RNA is generally detectable in upper and lower  respiratory specimens dur ing the acute phase of infection.  Positive  results are indicative of active infection with SARS-CoV-2.  Clinical  correlation with patient history and other diagnostic information is  necessary to determine patient infection status.  Positive results do  not rule out bacterial infection or co-infection with other viruses. If result is PRESUMPTIVE POSTIVE SARS-CoV-2 nucleic acids MAY BE PRESENT.   A presumptive positive result was obtained on the submitted specimen  and confirmed on repeat testing.  While 2019  novel coronavirus  (SARS-CoV-2) nucleic acids may be present in the submitted sample  additional confirmatory testing may be necessary for epidemiological  and / or clinical management purposes  to differentiate between  SARS-CoV-2 and other Sarbecovirus currently known to infect humans.  If clinically indicated additional testing with an alternate test  methodology [phone]) is advised. The SARS-CoV-2 RNA is generally  detectable in upper and lower respiratory sp ecimens during the acute  phase of infection. The expected result is Negative. Fact Sheet for Patients:  StrictlyIdeas.no Fact Sheet for Healthcare Providers: BankingDealers.co.za This test is not yet approved or cleared by the [place] FDA and has been authorized for detection and/or diagnosis of SARS-CoV-2 by FDA under an Emergency Use Authorization (EUA).  This EUA will remain in effect (meaning this test can be used) for the duration of the COVID-19 declaration under Section 564(b)(1) of the Act, 21 U.S.C. section 360bbb-3(b)(1), unless the authorization is terminated or revoked sooner. Performed at [facility], [address] [place], [place] [address]   Blood Culture (routine x 2)     Status: None (Preliminary result)   Collection Time: 11/10/18  9:11 PM   Specimen: BLOOD  Result Value Ref Range Status   Specimen Description BLOOD LEFT UPPER ARM  Final   Special Requests   Final    BOTTLES DRAWN AEROBIC AND ANAEROBIC Blood Culture results may not be optimal due to an excessive volume of blood received in culture bottles   Culture   Final    NO GROWTH 3 DAYS Performed at [facility], [address] [place], [place] [address]    Report Status PENDING  Incomplete  Blood Culture (routine x 2)     Status: None (Preliminary result)   Collection Time: 11/10/18  9:11 PM   Specimen: BLOOD  Result Value Ref Range Status   Specimen Description  BLOOD RIGHT FOREARM  Final   Special Requests   Final    BOTTLES DRAWN AEROBIC AND ANAEROBIC Blood Culture results may not be optimal due to an excessive volume of blood received in culture bottles   Culture   Final    NO GROWTH 3 DAYS Performed at [facility], [address] [place], [place] [address]    Report Status PENDING  Incomplete  Urine culture     Status: None   Collection Time: 11/10/18  9:11 PM   Specimen: In/Out Cath Urine  Result Value Ref Range Status   Specimen Description   Final    IN/OUT CATH URINE Performed at [facility], [address] [place], [place] [address]    Special Requests   Final    NONE Performed at [facility], [address], [place], [place] [address]    Culture   Final    NO GROWTH Performed at [facility], [place] [address], [place],  [address]    Report Status 11/12/2018 FINAL  Final  C difficile quick scan w PCR reflex     Status: Abnormal   Collection Time: 11/11/18  1:58 AM   Specimen: STOOL  Result Value Ref Range Status   C Diff antigen POSITIVE (A) NEGATIVE Final    Comment: CRITICAL RESULT CALLED TO, READ BACK BY AND VERIFIED WITH: SYLVIA FUENTES @318  11/11/2018 TTG    C Diff toxin POSITIVE (A) NEGATIVE Final    Comment: CRITICAL RESULT CALLED TO, READ BACK BY AND VERIFIED WITH:  SYLVIA FUENTES @318  11/11/2018 TTG    C Diff interpretation Toxin producing C. difficile detected.  Final    Comment: Performed at [facility], [address] [place], [place] [address]  Gastrointestinal Panel by PCR , Stool     Status: None   Collection Time: 11/11/18  1:58 AM   Specimen: STOOL  Result Value Ref Range Status   Campylobacter species NOT DETECTED NOT DETECTED Final   Plesimonas shigelloides NOT DETECTED NOT DETECTED Final   Salmonella species NOT DETECTED NOT DETECTED Final   Yersinia enterocolitica NOT DETECTED NOT DETECTED Final   Vibrio species NOT DETECTED NOT DETECTED Final    Vibrio cholerae NOT DETECTED NOT DETECTED Final   Enteroaggregative E coli (EAEC) NOT DETECTED NOT DETECTED Final   Enteropathogenic E coli (EPEC) NOT DETECTED NOT DETECTED Final   Enterotoxigenic E coli (ETEC) NOT DETECTED NOT DETECTED Final   Shiga like toxin producing E coli (STEC) NOT DETECTED NOT DETECTED Final   Shigella/Enteroinvasive E coli (EIEC) NOT DETECTED NOT DETECTED Final   Cryptosporidium NOT DETECTED NOT DETECTED Final   Cyclospora cayetanensis NOT DETECTED NOT DETECTED Final   Entamoeba histolytica NOT DETECTED NOT DETECTED Final   Giardia lamblia NOT DETECTED NOT DETECTED Final   Adenovirus F40/41 NOT DETECTED NOT DETECTED Final   Astrovirus NOT DETECTED NOT DETECTED Final   Norovirus GI/GII NOT DETECTED NOT DETECTED Final   Rotavirus A NOT DETECTED NOT DETECTED Final   Sapovirus (I, II, IV, and V) NOT DETECTED NOT DETECTED Final    Comment: Performed at [facility], [address] [place], [place] [address]        Code Status Orders  (From admission, onward)         Start     Ordered   11/11/18 0206  Full code  Continuous     11/11/18 0205        Code Status History    Date Active Date Inactive Code Status Order ID Comments User Context   09/09/2017 1459 09/10/2017 1806 Full Code [id number]  Epifanio Lesches, MD ED   06/12/2017 2000 06/16/2017 2017 DNR [id number]  Saundra Shelling, MD ED   03/03/2016 2223 03/05/2016 1837 Full Code [id number]  Lance Coon, MD Inpatient   Advance Care Planning Activity    Advance Directive Documentation     Most Recent Value  Type of Advance Directive  Living will  Pre-existing out of facility DNR order (yellow form or pink MOST form)  -  "MOST" Form in Place?  -            Discharge Medications   Allergies as of 11/13/2018      Reactions   Quinolones    tendonitis      Medication List    STOP taking these medications   polyethylene glycol 17 g packet Commonly known as: MIRALAX / GLYCOLAX      TAKE these medications   acetaminophen 325 MG tablet Commonly known as: TYLENOL Take 2 tablets (650 mg total) by mouth every 6 (six) hours as needed for mild pain or moderate pain (or Fever >/= 101).   amLODipine 5 MG tablet Commonly known as: NORVASC TAKE 1 TABLET EVERY DAY   apixaban 2.5 MG Tabs tablet Commonly known as: ELIQUIS Take 1 tablet (2.5 mg total) by mouth 2 (two) times daily.   carbidopa-levodopa 25-100 MG tablet Commonly known as: SINEMET IR TAKE 1 TABLET THREE TIMES DAILY What changed: when to take this   levothyroxine 112  MCG tablet Commonly known as: SYNTHROID TAKE 1 TABLET EVERY DAY BEFORE BREAKFAST   vancomycin 50 mg/mL  oral solution Commonly known as: VANCOCIN Take 2.5 mLs (125 mg total) by mouth 4 (four) times daily for 56 doses.   vitamin B-12 1000 MCG tablet Commonly known as: CYANOCOBALAMIN Take 1,000 mcg by mouth daily.   Vitamin D 50 MCG (2000 UT) Caps Take 2,000 Units by mouth daily.          Total Time in preparing paper work, data evaluation and todays exam - 51 minutes  Dustin Flock M.D on 11/13/2018 at 11:49 AM [place]  [phone]

## 2018-11-13 NOTE — NC FL2 (Signed)
[place] LEVEL OF CARE SCREENING TOOL     IDENTIFICATION  Patient Name: Walter [name] Birthdate: 1931/07/03 Sex: male Admission Date (Current Location): 11/10/2018  [place] and [place] Number:  Engineering geologist and Address:  [facility], [address], [place], [place] [address]      Provider Number: [id number]  Attending Physician Name and Address:  Dustin Flock, MD  Relative Name and Phone Number:  Casson Gewirtz [phone]    Current Level of Care: Hospital Recommended Level of Care: [place] Prior Approval Number:    Date Approved/Denied:   PASRR Number: [id number]  Discharge Plan: SNF    Current Diagnoses: Patient Active Problem List   Diagnosis Date Noted  . Colitis 11/11/2018  . Pressure injury of skin 11/11/2018  . Oropharyngeal dysphagia 10/02/2018  . Heat exhaustion 09/14/2018  . Abdominal aortic aneurysm (AAA) ([place])   . RBD (REM behavioral disorder) 10/24/2017  . Advance directive discussed with patient 11/01/2016  . Renal cyst, right 06/06/2016  . Acute deep vein thrombosis (DVT) of popliteal vein of right lower extremity ([place]) 04/28/2016  . CKD (chronic kidney disease) stage 3, GFR 30-59 ml/min (HCC) 03/05/2016  . Pulmonary embolism ([place]) 03/03/2016  . Vitamin B12 deficiency 09/09/2014  . Preventative health care 08/04/2014  . Parkinson's disease ([place]) 08/04/2014  . Esophageal diverticulum 01/01/2014  . Actinic keratosis 07/31/2013  . Hypertension   . Osteoarthritis of both knees   . Toe amputation status   . Benign prostatic hyperplasia   . Kidney stones   . Hypothyroid   . Tendonitis     Orientation RESPIRATION BLADDER Height & Weight     Self, Time, Situation, Place  Normal Continent Weight: 78.5 kg Height:  5\' 10"  (177.8 cm)  BEHAVIORAL SYMPTOMS/MOOD NEUROLOGICAL BOWEL NUTRITION STATUS      Continent Diet  AMBULATORY STATUS COMMUNICATION OF NEEDS Skin   Limited Assist Verbally  Normal                       Personal Care Assistance Level of Assistance  Bathing, Feeding, Dressing Bathing Assistance: Limited assistance Feeding assistance: Independent Dressing Assistance: Limited assistance     Functional Limitations Info             SPECIAL CARE FACTORS FREQUENCY  PT (By licensed PT), OT (By licensed OT)     PT Frequency: 5 times per week OT Frequency: 5 times per week            Contractures Contractures Info: Not present    Additional Factors Info  Allergies, Isolation Precautions, Code Status(Enteric Precautions) Code Status Info: Full Allergies Info: Quinolones           Current Medications (11/13/2018):  This is the current hospital active medication list Current Facility-Administered Medications  Medication Dose Route Frequency Provider Last Rate Last Dose  . 0.9 %  sodium chloride infusion   Intravenous Continuous Max Sane, MD 75 mL/hr at 11/12/18 2102    . acetaminophen (TYLENOL) tablet 650 mg  650 mg Oral Q6H PRN Max Sane, MD   650 mg at 11/13/18 0410   Or  . acetaminophen (TYLENOL) suppository 650 mg  650 mg Rectal Q6H PRN Max Sane, MD      . apixaban Arne Cleveland) tablet 2.5 mg  2.5 mg Oral BID Max Sane, MD   2.5 mg at 11/12/18 2102  . carbidopa-levodopa (SINEMET IR) 25-100 MG per tablet immediate release 1 tablet  1 tablet  Oral TID Max Sane, MD   1 tablet at 11/12/18 2102  . cholecalciferol (VITAMIN D3) tablet 2,000 Units  2,000 Units Oral Daily Max Sane, MD   2,000 Units at 11/12/18 [phone]  . HYDROcodone-acetaminophen (NORCO/VICODIN) 5-325 MG per tablet 1-2 tablet  1-2 tablet Oral Q4H PRN Max Sane, MD      . iohexol (OMNIPAQUE) 9 MG/ML oral solution 500 mL  500 mL Oral BID PRN Duffy Bruce, MD   500 mL at 11/10/18 2220  . levothyroxine (SYNTHROID) tablet 112 mcg  112 mcg Oral Q0600 Max Sane, MD   112 mcg at 11/13/18 0403  . ondansetron (ZOFRAN) tablet 4 mg  4 mg Oral Q6H PRN Max Sane, MD       Or  .  ondansetron (ZOFRAN) injection 4 mg  4 mg Intravenous Q6H PRN Max Sane, MD      . traZODone (DESYREL) tablet 25 mg  25 mg Oral QHS PRN Max Sane, MD   25 mg at 11/11/18 2050  . vancomycin (VANCOCIN) 50 mg/mL oral solution 125 mg  125 mg Oral QID Mansy, Jan A, MD   125 mg at 11/13/18 0403  . vitamin B-12 (CYANOCOBALAMIN) tablet 1,000 mcg  1,000 mcg Oral Daily Max Sane, MD   1,000 mcg at 11/12/18 [id number]     Discharge Medications: Please see discharge summary for a list of discharge medications.  Relevant Imaging Results:  Relevant Lab Results:   Additional Information SS# [id number]  Shelbie Hutching, RN

## 2018-11-15 DIAGNOSIS — S98131A Complete traumatic amputation of one right lesser toe, initial encounter: Secondary | ICD-10-CM | POA: Diagnosis not present

## 2018-11-15 DIAGNOSIS — A0472 Enterocolitis due to Clostridium difficile, not specified as recurrent: Secondary | ICD-10-CM | POA: Diagnosis not present

## 2018-11-15 DIAGNOSIS — I2692 Saddle embolus of pulmonary artery without acute cor pulmonale: Secondary | ICD-10-CM | POA: Diagnosis not present

## 2018-11-15 DIAGNOSIS — R531 Weakness: Secondary | ICD-10-CM | POA: Diagnosis not present

## 2018-11-15 DIAGNOSIS — G20 Parkinson's disease: Secondary | ICD-10-CM | POA: Diagnosis not present

## 2018-11-15 DIAGNOSIS — I10 Essential (primary) hypertension: Secondary | ICD-10-CM | POA: Diagnosis not present

## 2018-11-15 LAB — CULTURE, BLOOD (ROUTINE X 2)
Culture: NO GROWTH
Culture: NO GROWTH

## 2018-11-28 DIAGNOSIS — E039 Hypothyroidism, unspecified: Secondary | ICD-10-CM | POA: Diagnosis not present

## 2018-11-28 DIAGNOSIS — Z86718 Personal history of other venous thrombosis and embolism: Secondary | ICD-10-CM | POA: Diagnosis not present

## 2018-11-28 DIAGNOSIS — A0471 Enterocolitis due to Clostridium difficile, recurrent: Secondary | ICD-10-CM | POA: Diagnosis not present

## 2018-11-28 DIAGNOSIS — I2782 Chronic pulmonary embolism: Secondary | ICD-10-CM | POA: Diagnosis not present

## 2018-11-28 DIAGNOSIS — I129 Hypertensive chronic kidney disease with stage 1 through stage 4 chronic kidney disease, or unspecified chronic kidney disease: Secondary | ICD-10-CM | POA: Diagnosis not present

## 2018-11-28 DIAGNOSIS — N183 Chronic kidney disease, stage 3 (moderate): Secondary | ICD-10-CM | POA: Diagnosis not present

## 2018-11-28 DIAGNOSIS — G20 Parkinson's disease: Secondary | ICD-10-CM | POA: Diagnosis not present

## 2018-11-28 DIAGNOSIS — S0101XD Laceration without foreign body of scalp, subsequent encounter: Secondary | ICD-10-CM | POA: Diagnosis not present

## 2018-11-28 DIAGNOSIS — W06XXXD Fall from bed, subsequent encounter: Secondary | ICD-10-CM | POA: Diagnosis not present

## 2018-11-29 ENCOUNTER — Telehealth: Payer: Self-pay | Admitting: Neurology

## 2018-11-29 ENCOUNTER — Telehealth: Payer: Self-pay

## 2018-11-29 DIAGNOSIS — R1312 Dysphagia, oropharyngeal phase: Secondary | ICD-10-CM

## 2018-11-29 NOTE — Telephone Encounter (Signed)
I called her while I was at Walter [name]. He did have trouble with a choking spell 3 days ago--and was frightened about swallowing. He is able to drink Went home yesterday  They both believe his current problems are more related to the Zenker's diverticulum that was not successfully operated on in the past (than his Parkinson's) He did see Dr Richardson Landry about a month ago---who did direct laryngoscopy and stated that there was nothing he could do surgically at this point.  Discussed that she would need to bring him to the ER if he is not able to swallow (or contact Dr Richardson Landry to see if he could recheck him in the office).  Morey Hummingbird, Please fax this note to Dr Richardson Landry

## 2018-11-29 NOTE — Telephone Encounter (Signed)
Noted! Thank you

## 2018-11-29 NOTE — Telephone Encounter (Signed)
Patient wife called and states that she needs to speak to someone about him having a hard time keeping anything down and having a lot of mucus. Needs to know if there is any medication or what they can do to help this problem

## 2018-11-29 NOTE — Telephone Encounter (Signed)
Spoke with patient spouse she declines test until patient is seen in the office sooner then his appt scheduled for 01/15/19.  She is requesting a sooner appt.  Sent to front desk to see if there is a sooner appt available

## 2018-11-29 NOTE — Telephone Encounter (Signed)
Should this be address by PCP?

## 2018-11-29 NOTE — Telephone Encounter (Signed)
Patient wife would like to speak to someone and was calling to check the status of this

## 2018-11-29 NOTE — Telephone Encounter (Signed)
He hasn't had a swallow study I dont think since 2017 but even then he had a significant dysphagia.  Lets repeat that if they are agreeable (MBE).  Regarding mucous, this is common with PD, but unfortunately not a good solution at all to it.

## 2018-11-29 NOTE — Telephone Encounter (Signed)
Mrs Dragone (DPR signed) said that pt was released 11/28/18 from [place] care at [facility] back to his home in independent living; for 2 days pt is not able to keep food or med down due to phlegm in his throat; Mrs Holstad said Dr Silvio Pate saw pt recently and is aware of situation but Mrs Turi said he is keeping down some water and is urinating normally, last urination this morning at 8 AM but pt needs to be able to eat and keep his parkinson med down. Mrs Macquarrie said pt is lightheaded. No abd pain or dry mouth. Pt has no covid symptoms, no travel and no known exposure to + covid. Mrs Oskey is going to ck with nurse at [facility] also to see if they can reach Dr Silvio Pate this morning; Mrs Sitzer request cb. [facility].

## 2018-11-29 NOTE — Telephone Encounter (Signed)
Yes I got patient in on 12-24-18 wife is aware of the appt

## 2018-12-04 ENCOUNTER — Telehealth: Payer: Self-pay | Admitting: Internal Medicine

## 2018-12-04 NOTE — Telephone Encounter (Signed)
Spoke to [place] at [place] at [facility]

## 2018-12-04 NOTE — Telephone Encounter (Signed)
That is fine 

## 2018-12-04 NOTE — Telephone Encounter (Signed)
Colletta Maryland with [facility] called today. They received a referral from our office for them to see the patient  She stated they are experiencing delays and will see the patient tomorrow 10/7  [facility]phanie[facility] Phone-  [phone]

## 2018-12-05 ENCOUNTER — Other Ambulatory Visit: Payer: Self-pay

## 2018-12-05 ENCOUNTER — Ambulatory Visit (INDEPENDENT_AMBULATORY_CARE_PROVIDER_SITE_OTHER): Payer: Medicare HMO | Admitting: Internal Medicine

## 2018-12-05 ENCOUNTER — Encounter: Payer: Self-pay | Admitting: Internal Medicine

## 2018-12-05 VITALS — BP 118/70 | HR 72 | Temp 98.4°F | Ht 70.0 in | Wt 153.0 lb

## 2018-12-05 DIAGNOSIS — K529 Noninfective gastroenteritis and colitis, unspecified: Secondary | ICD-10-CM | POA: Diagnosis not present

## 2018-12-05 DIAGNOSIS — Z23 Encounter for immunization: Secondary | ICD-10-CM

## 2018-12-05 DIAGNOSIS — G20 Parkinson's disease: Secondary | ICD-10-CM | POA: Diagnosis not present

## 2018-12-05 DIAGNOSIS — R1312 Dysphagia, oropharyngeal phase: Secondary | ICD-10-CM | POA: Diagnosis not present

## 2018-12-05 IMAGING — US US EXTREM LOW VENOUS BILAT
1 series · 13 of 24 positions shown · non-contrast
Comparison: None.

CLINICAL DATA: Left lower extremity pain. History of prior DVT and
pulmonary embolism. Former smoker. History of mild cancer. Evaluate
for DVT.



[Series 1: us extrem low venous bilat · 0.06mm/px · 13 of 79 slices shown]
[im 1/79]
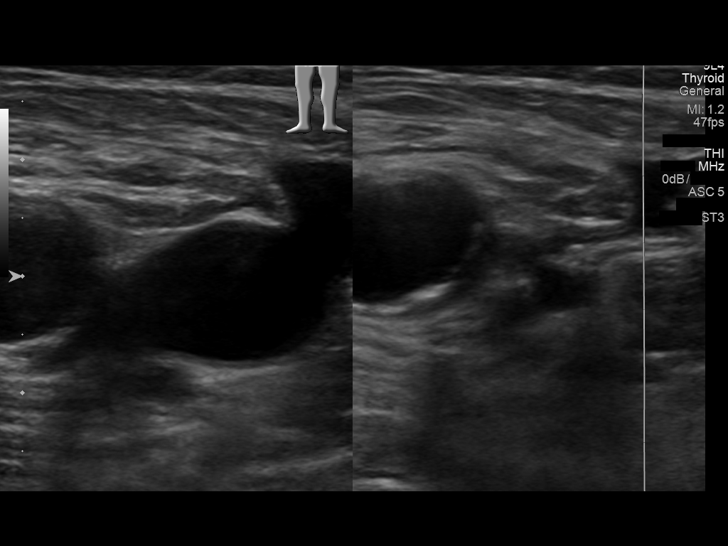
[im 7/79]
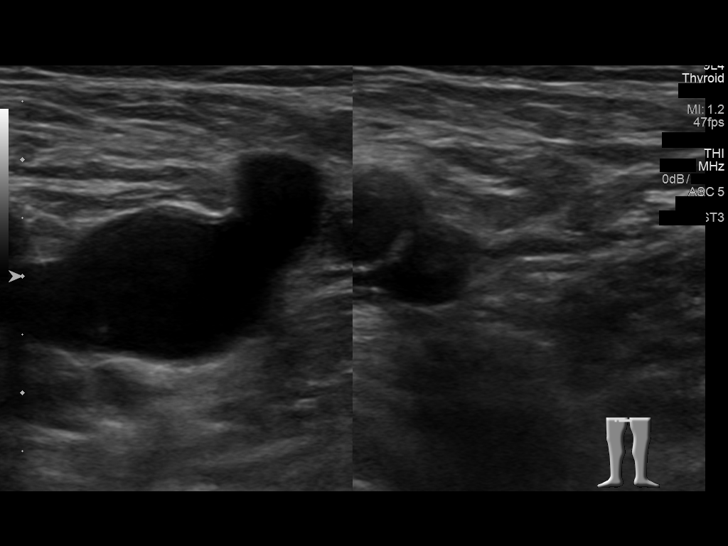
[im 14/79]
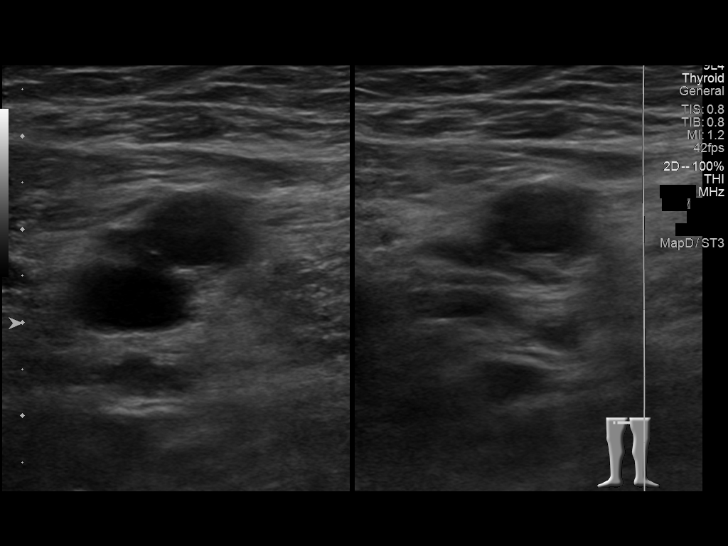
[im 21/79]
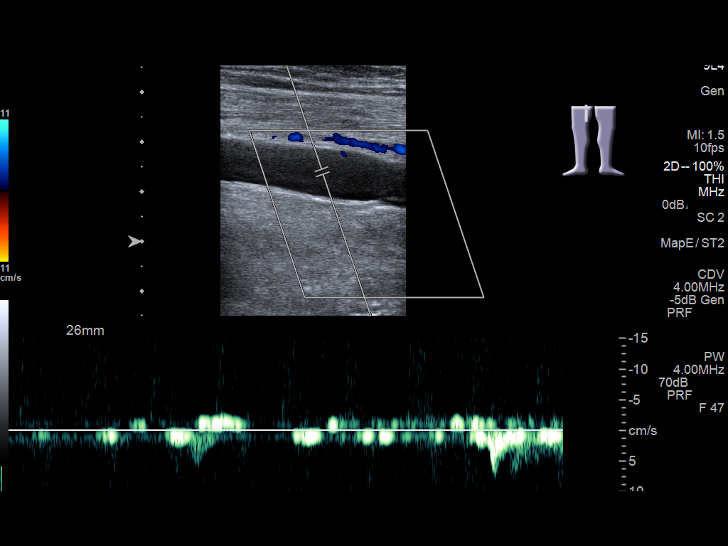
[im 28/79]
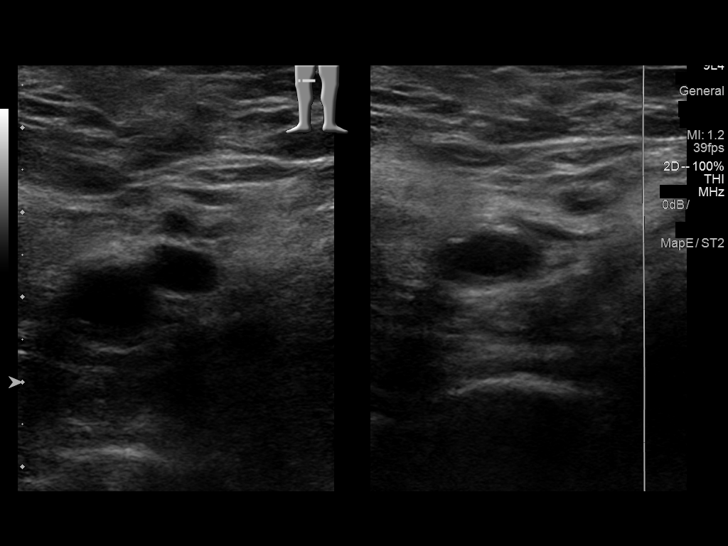
[im 34/79]
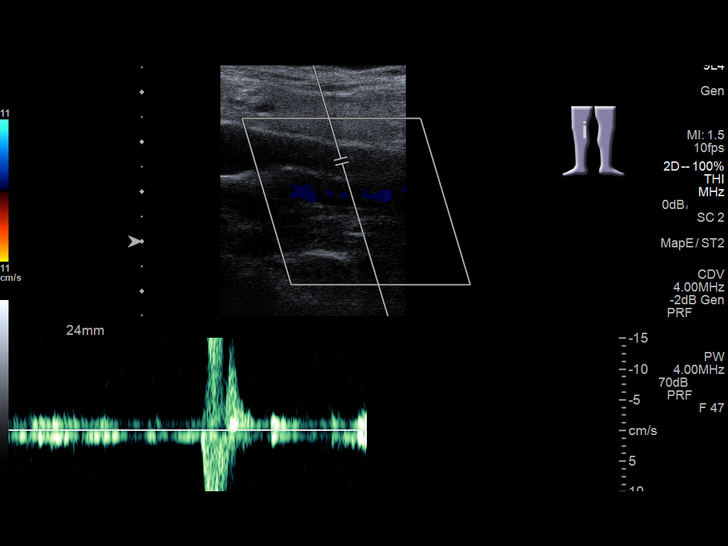
[im 41/79]
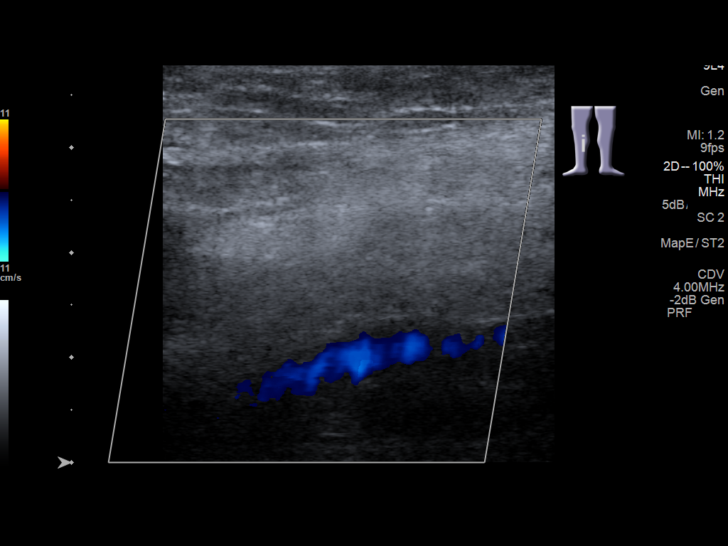
[im 45/79]
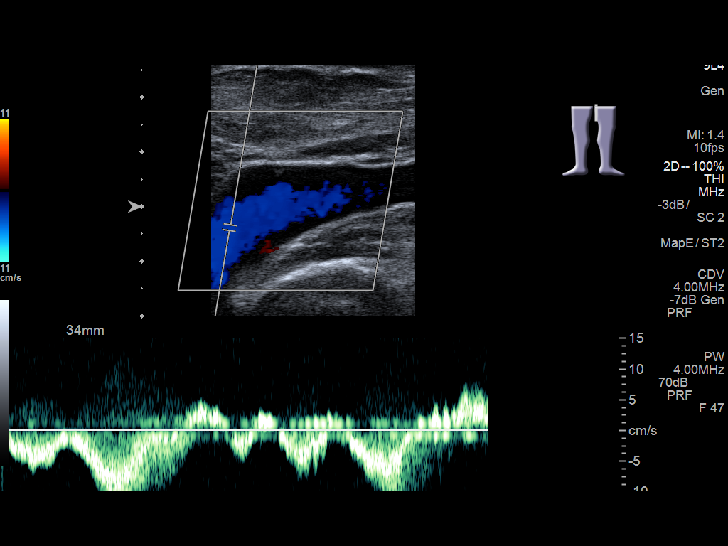
[im 51/79]
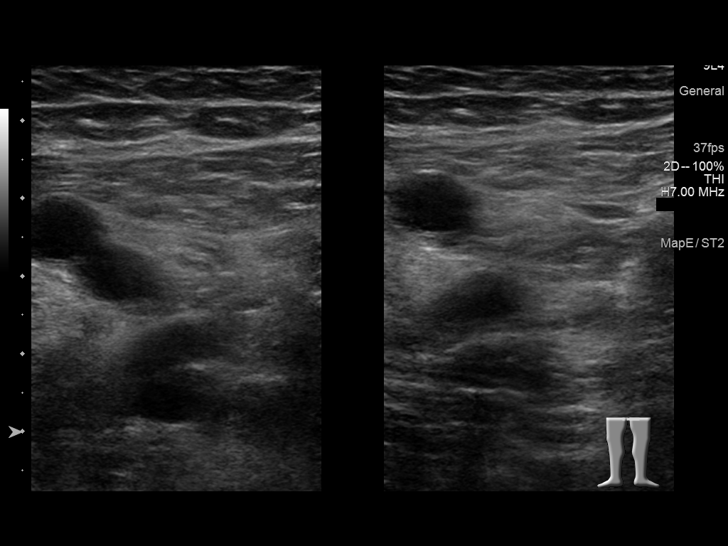
[im 58/79]
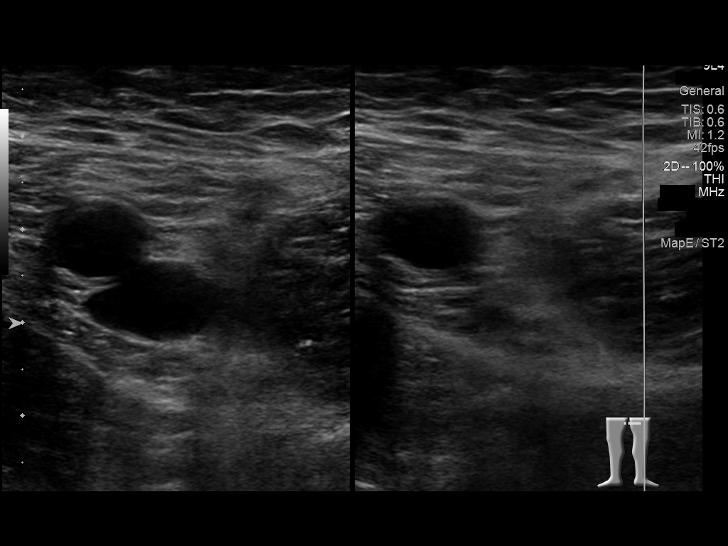
[im 65/79]
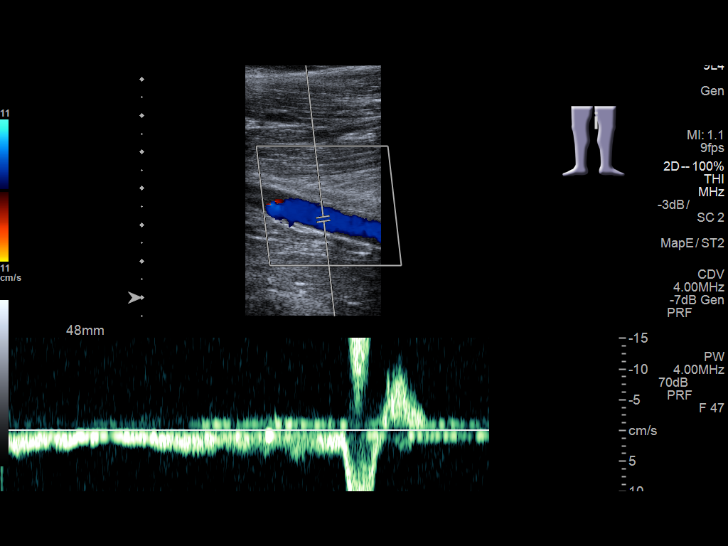
[im 72/79]
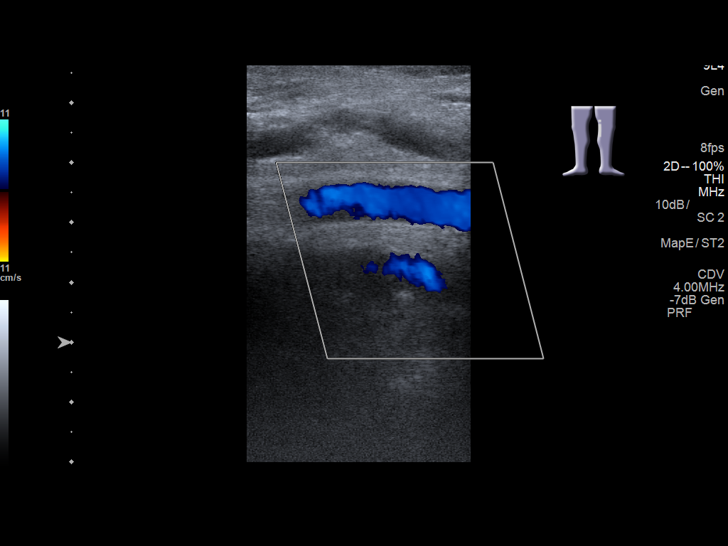
[im 79/79]
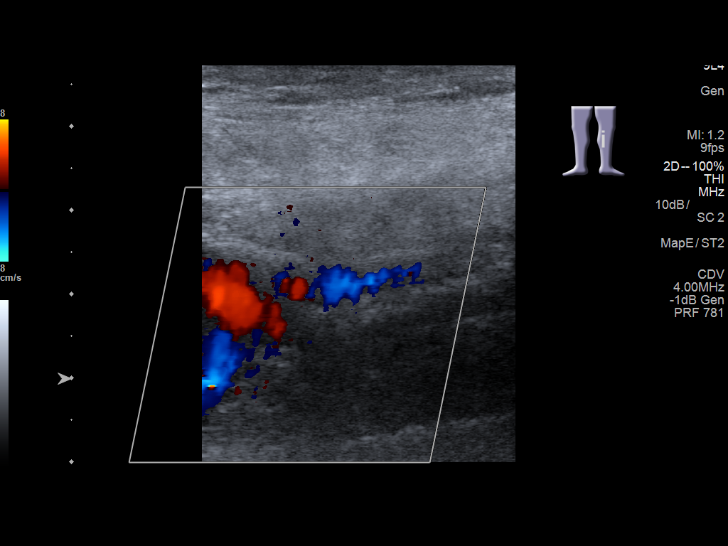

[13 of 24 positions shown; findings below may reference images not displayed]

FINDINGS: RIGHT LOWER EXTREMITY

Common Femoral Vein: No evidence of thrombus. Normal
compressibility, respiratory phasicity and response to augmentation.

Saphenofemoral Junction: No evidence of thrombus. Normal
compressibility and flow on color Doppler imaging.

Profunda Femoral Vein: No evidence of thrombus. Normal
compressibility and flow on color Doppler imaging.

Femoral Vein: There is largely hypoechoic expansile occlusive
thrombus within the distal aspect of the right femoral vein (images
20 and 21).

Popliteal Vein: No evidence of thrombus. Normal compressibility,
respiratory phasicity and response to augmentation.

Calf Veins: No evidence of thrombus. Normal compressibility and flow
on color Doppler imaging.

Superficial Great Saphenous Vein: No evidence of thrombus. Normal
compressibility and flow on color Doppler imaging.

Venous Reflux:  None.

Other Findings:  None.

LEFT LOWER EXTREMITY

Common Femoral Vein: No evidence of thrombus. Normal
compressibility, respiratory phasicity and response to augmentation.

Saphenofemoral Junction: No evidence of thrombus. Normal
compressibility and flow on color Doppler imaging.

Profunda Femoral Vein: No evidence of thrombus. Normal
compressibility and flow on color Doppler imaging.

Femoral Vein: No evidence of thrombus. Normal compressibility,
respiratory phasicity and response to augmentation.

Popliteal Vein: No evidence of thrombus. Normal compressibility,
respiratory phasicity and response to augmentation.

Calf Veins: No evidence of thrombus. Normal compressibility and flow
on color Doppler imaging.

Superficial Great Saphenous Vein: No evidence of thrombus. Normal
compressibility and flow on color Doppler imaging.

Venous Reflux:  None.

Other Findings:  None.
IMPRESSION: 1. The examination is positive for age-indeterminate short-segment
occlusive DVT within distal aspect of the right femoral vein.
2. No evidence of acute or chronic DVT within the left lower
extremity.

## 2018-12-05 NOTE — Assessment & Plan Note (Signed)
From C diff This is better but still has lost weight

## 2018-12-05 NOTE — Progress Notes (Signed)
Subjective:    Patient ID: Walter [name], male    DOB: 1931-12-26, 85 y.o.   MRN: [id number]  HPI Here for follow up after rehab stay Wife is here and son on speaker phone Extended C diff colitis and weakness Also with increased swallowing problems  Still with episodic swallowing problems Some days are fine Other days---can't swallow anything and then coughs up phlegm No clear pattern---doesn't relate to his meds Highly variable from day to day Has tried boost on bad days---but even that seems thick to him Does keep up with fluids --but probably not enough  Diarrhea is better Off the vanco for a while  Current Outpatient Medications on File Prior to Visit  Medication Sig Dispense Refill  . acetaminophen (TYLENOL) 325 MG tablet Take 2 tablets (650 mg total) by mouth every 6 (six) hours as needed for mild pain or moderate pain (or Fever >/= 101).    Marland Kitchen amLODipine (NORVASC) 5 MG tablet TAKE 1 TABLET EVERY DAY 90 tablet 3  . apixaban (ELIQUIS) 2.5 MG TABS tablet Take 1 tablet (2.5 mg total) by mouth 2 (two) times daily. 180 tablet 3  . carbidopa-levodopa (SINEMET IR) 25-100 MG tablet TAKE 1 TABLET THREE TIMES DAILY (Patient taking differently: Take 1 tablet by mouth 4 (four) times daily. ) 270 tablet 3  . Cholecalciferol (VITAMIN D) 2000 UNITS CAPS Take 2,000 Units by mouth daily.     Marland Kitchen levothyroxine (SYNTHROID) 112 MCG tablet TAKE 1 TABLET EVERY DAY BEFORE BREAKFAST 90 tablet 3  . vitamin B-12 (CYANOCOBALAMIN) 1000 MCG tablet Take 1,000 mcg by mouth daily.     No current facility-administered medications on file prior to visit.     Allergies  Allergen Reactions  . Quinolones     tendonitis    Past Medical History:  Diagnosis Date  . Abdominal aortic aneurysm (AAA) ([place])   . ARF (acute renal failure) ([place]) 2011   after TKR  . BPH (benign prostatic hypertrophy)   . DVT (deep venous thrombosis) ([place]) 1998   after left knee arthroscopy  . Hx of colonoscopy 2000  . Hypertension    . Hypothyroid 2007   postop from thyroidectomy (cancer scare)  . Kidney stones    recurrent  . Oral cancer ([place]) 2004   graft from left thigh  . Osteoarthritis of both knees   . Parkinson's disease ([place])   . Pulmonary embolism ([place])   . Tendonitis 2012   from quinolone  . Toe amputation status 2010    Past Surgical History:  Procedure Laterality Date  . APPENDECTOMY    . CHOLECYSTECTOMY    . CYSTOSCOPY/URETEROSCOPY/HOLMIUM LASER/STENT PLACEMENT Right 09/13/2017   Procedure: CYSTOSCOPY/URETEROSCOPY/HOLMIUM LASER/STENT PLACEMENT;  Surgeon: Hollice Espy, MD;  Location: [facility] ORS;  Service: Urology;  Laterality: Right;  . HERNIA REPAIR    . INTRAMEDULLARY (IM) NAIL INTERTROCHANTERIC Right 06/13/2017   Procedure: INTRAMEDULLARY (IM) NAIL INTERTROCHANTRIC;  Surgeon: Hessie Knows, MD;  Location: [facility] ORS;  Service: Orthopedics;  Laterality: Right;  . LITHOTRIPSY  2009/2010   then stent and cystoscopic removal 2014  . Oral cancer removed Right 2004  . REPAIR ZENKER'S DIVERTICULA  2013  . THYROIDECTOMY  2007  . TOE AMPUTATION Right 2010   badly overlapping other toe  . TOTAL KNEE ARTHROPLASTY Left   . TRANSURETHRAL RESECTION OF PROSTATE  2011  . UMBILICAL HERNIA REPAIR  2007    Family History  Problem Relation Age of Onset  . Dementia Mother   . Stroke Father   .  Pulmonary disease Brother   . Heart disease Neg Hx   . Diabetes Neg Hx     Social History   Socioeconomic History  . Marital status: Married    Spouse name: Not on file  . Number of children: 3  . Years of education: Not on file  . Highest education level: Not on file  Occupational History  . 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 Financial resource strain: Not on file  . Food insecurity    Worry: Not on file    Inability: Not on file  . Transportation needs    Medical: Not on file    Non-medical: Not on file  Tobacco Use   . Smoking status: Former Smoker    Types: Cigars    Quit date: 04/27/2002    Years since quitting: 16.6  . Smokeless tobacco: Never Used  Substance and Sexual Activity  . Alcohol use: Not Currently    Alcohol/week: 0.0 standard drinks  . Drug use: No  . Sexual activity: Not Currently  Lifestyle  . Physical activity    Days per week: Not on file    Minutes per session: Not on file  . Stress: Not on file  Relationships  . Social Herbalist on phone: Not on file    Gets together: Not on file    Attends religious service: Not on file    Active member of club or organization: Not on file    Attends meetings of clubs or organizations: Not on file    Relationship status: Not on file  . Intimate partner violence    Fear of current or ex partner: Not on file    Emotionally abused: Not on file    Physically abused: Not on file    Forced sexual activity: Not on file  Other Topics Concern  . Not on file  Social History Narrative   Son works for [employer]   1 daughter in [place], other in [place]      Has living will   Wife, then son, would be health care POA   He isn't sure about DNR--will accept resuscitation for now   No tube feeds if cognitively unaware   Review of Systems Has lost 10# or so Sleeping more in the days Not getting up to void---hydration status No heartburn Special issues swallowing amlodipine--more than other medications    Objective:   Physical Exam  Constitutional: No distress.  HENT:  Long uvula Intact gag  Neck: No thyromegaly present.  Cardiovascular: Normal rate, regular rhythm and normal heart sounds. Exam reveals no gallop.  No murmur heard. Respiratory: No respiratory distress. He has no wheezes. He has no rales.  Decreased breath sounds but clear  Lymphadenopathy:    He has no cervical adenopathy.  Psychiatric: He has a normal mood and affect. His behavior is normal.           Assessment & Plan:

## 2018-12-05 NOTE — Assessment & Plan Note (Signed)
?  main reason for the dysphagia Has upcoming follow up with Dr Carles Collet

## 2018-12-05 NOTE — Assessment & Plan Note (Signed)
Unlikely to be related to the Zenker's (mild persistence) Likely related to Parkinson's Will reorder the intended swallow study

## 2018-12-06 ENCOUNTER — Other Ambulatory Visit: Payer: Self-pay | Admitting: Neurology

## 2018-12-06 ENCOUNTER — Other Ambulatory Visit: Payer: Self-pay | Admitting: Internal Medicine

## 2018-12-06 DIAGNOSIS — R1312 Dysphagia, oropharyngeal phase: Secondary | ICD-10-CM

## 2018-12-07 ENCOUNTER — Encounter: Payer: Medicare HMO | Admitting: Internal Medicine

## 2018-12-07 ENCOUNTER — Other Ambulatory Visit (HOSPITAL_COMMUNITY): Payer: Self-pay | Admitting: *Deleted

## 2018-12-07 DIAGNOSIS — I714 Abdominal aortic aneurysm, without rupture: Secondary | ICD-10-CM | POA: Diagnosis not present

## 2018-12-07 DIAGNOSIS — I2782 Chronic pulmonary embolism: Secondary | ICD-10-CM | POA: Diagnosis not present

## 2018-12-07 DIAGNOSIS — N183 Chronic kidney disease, stage 3 unspecified: Secondary | ICD-10-CM | POA: Diagnosis not present

## 2018-12-07 DIAGNOSIS — I129 Hypertensive chronic kidney disease with stage 1 through stage 4 chronic kidney disease, or unspecified chronic kidney disease: Secondary | ICD-10-CM | POA: Diagnosis not present

## 2018-12-07 DIAGNOSIS — N400 Enlarged prostate without lower urinary tract symptoms: Secondary | ICD-10-CM | POA: Diagnosis not present

## 2018-12-07 DIAGNOSIS — G20 Parkinson's disease: Secondary | ICD-10-CM | POA: Diagnosis not present

## 2018-12-07 DIAGNOSIS — E039 Hypothyroidism, unspecified: Secondary | ICD-10-CM | POA: Diagnosis not present

## 2018-12-07 DIAGNOSIS — E538 Deficiency of other specified B group vitamins: Secondary | ICD-10-CM | POA: Diagnosis not present

## 2018-12-07 DIAGNOSIS — M1711 Unilateral primary osteoarthritis, right knee: Secondary | ICD-10-CM | POA: Diagnosis not present

## 2018-12-07 DIAGNOSIS — R1310 Dysphagia, unspecified: Secondary | ICD-10-CM

## 2018-12-10 ENCOUNTER — Ambulatory Visit (HOSPITAL_COMMUNITY)
Admission: RE | Admit: 2018-12-10 | Discharge: 2018-12-10 | Disposition: A | Discharge status: Home / Self Care | Payer: Medicare HMO | Source: Ambulatory Visit | Attending: Internal Medicine | Admitting: Internal Medicine

## 2018-12-10 ENCOUNTER — Ambulatory Visit (HOSPITAL_COMMUNITY): Payer: Medicare HMO

## 2018-12-10 ENCOUNTER — Other Ambulatory Visit: Payer: Self-pay

## 2018-12-10 ENCOUNTER — Encounter (HOSPITAL_COMMUNITY): Payer: Medicare HMO

## 2018-12-10 DIAGNOSIS — R05 Cough: Secondary | ICD-10-CM | POA: Diagnosis not present

## 2018-12-10 DIAGNOSIS — R1312 Dysphagia, oropharyngeal phase: Secondary | ICD-10-CM

## 2018-12-10 DIAGNOSIS — R1310 Dysphagia, unspecified: Secondary | ICD-10-CM | POA: Diagnosis not present

## 2018-12-10 NOTE — Therapy (Signed)
Objective Swallowing Evaluation: Type of Study: MBS-Modified Barium Swallow Study   Patient Details  Name: Walter [name] MRN: [id number] Date of Birth: [birth year]  Today's Date: 12/10/2018 Time: SLP Start Time (ACUTE ONLY): 1225 -SLP Stop Time (ACUTE ONLY): 1304  SLP Time Calculation (min) (ACUTE ONLY): 39 min   Past Medical History:  Past Medical History:  Diagnosis Date  . Abdominal aortic aneurysm (AAA) ([place])   . ARF (acute renal failure) ([place]) 2011   after TKR  . BPH (benign prostatic hypertrophy)   . DVT (deep venous thrombosis) ([place]) 1998   after left knee arthroscopy  . Hx of colonoscopy 2000  . Hypertension   . Hypothyroid 2007   postop from thyroidectomy (cancer scare)  . Kidney stones    recurrent  . Oral cancer ([place]) 2004   graft from left thigh  . Osteoarthritis of both knees   . Parkinson's disease ([place])   . Pulmonary embolism ([place])   . Tendonitis 2012   from quinolone  . Toe amputation status 2010   Past Surgical History:  Past Surgical History:  Procedure Laterality Date  . APPENDECTOMY    . CHOLECYSTECTOMY    . CYSTOSCOPY/URETEROSCOPY/HOLMIUM LASER/STENT PLACEMENT Right 09/13/2017   Procedure: CYSTOSCOPY/URETEROSCOPY/HOLMIUM LASER/STENT PLACEMENT;  Surgeon: Hollice Espy, MD;  Location: [facility] ORS;  Service: Urology;  Laterality: Right;  . HERNIA REPAIR    . INTRAMEDULLARY (IM) NAIL INTERTROCHANTERIC Right 06/13/2017   Procedure: INTRAMEDULLARY (IM) NAIL INTERTROCHANTRIC;  Surgeon: Hessie Knows, MD;  Location: [facility] ORS;  Service: Orthopedics;  Laterality: Right;  . LITHOTRIPSY  2009/2010   then stent and cystoscopic removal 2014  . Oral cancer removed Right 2004  . REPAIR ZENKER'S DIVERTICULA  2013  . THYROIDECTOMY  2007  . TOE AMPUTATION Right 2010   badly overlapping other toe  . TOTAL KNEE ARTHROPLASTY Left   . TRANSURETHRAL RESECTION OF PROSTATE  2011  . UMBILICAL HERNIA REPAIR  [id number]   HPI: 85 year old male with history of Parkinson's, oral  cancer (2004) and Zeinker's Diverticulum (2013) s/p surgery but unable to completely correct.  Referred to SLP services for OP MBS due to increased difficulty swallowing and choking episodes.     No data recorded   Assessment / Plan / Recommendation  CHL IP CLINICAL IMPRESSIONS 12/10/2018  Clinical Impression Pt presents with grossly intact oral swallowing function with timely manipulation and transit of boluses to the oropharynx, swallow response was age appropriate with penetration into the laryngeal vestibule which cleared with subsequent swallows.  No aspiration evident with solids or liquids.  Most notable is the presence of a chronic zeinker's diverticulum which filled with material quickly during the study and did not clear despite multiple swallows or use of a liquid wash.   Pocketed materials from the Zeinker's during today's study remained clear of the airway but wife reports pt's "choking" episodes typically occur when pt begins eating and drinking quickly or in larger volumes.  I would suggest that pt remain on his current diet which appears to most closely resemble a mechanical soft solid diet with finely minced or even pureed meats (per wife's report).  SLP discussed compensatory strategies with pt's wife to maximize pt's comfort during PO intake.  All questions were answered to pt's and wife's satisfaction at this time.  No further ST needs indicated at this time.    SLP Visit Diagnosis Dysphagia, pharyngoesophageal phase (R13.14)  Attention and concentration deficit following --  Frontal lobe and executive function deficit following --  Impact  on safety and function Mild aspiration risk      CHL IP TREATMENT RECOMMENDATION 12/10/2018  Treatment Recommendations No treatment recommended at this time     Prognosis 12/10/2018  Prognosis for Safe Diet Advancement Fair  Barriers to Reach Goals --  Barriers/Prognosis Comment --    CHL IP DIET RECOMMENDATION 12/10/2018  SLP Diet  Recommendations ;Thin liquid  Liquid Administration via Cup  Medication Administration Crushed with puree  Compensations Minimize environmental distractions;Slow rate;Small sips/bites;Multiple dry swallows after each bite/sip;Follow solids with liquid;Hard cough after swallow  Postural Changes Remain semi-upright after after feeds/meals (Comment);Seated upright at 90 degrees      CHL IP OTHER RECOMMENDATIONS 12/10/2018  Recommended Consults Consider esophageal assessment  Oral Care Recommendations Oral care BID  Other Recommendations --      No flowsheet data found.    No flowsheet data found.         CHL IP ORAL PHASE 12/10/2018  Oral Phase WFL  Oral - Pudding Teaspoon --  Oral - Pudding Cup --  Oral - Honey Teaspoon --  Oral - Honey Cup --  Oral - Nectar Teaspoon --  Oral - Nectar Cup --  Oral - Nectar Straw --  Oral - Thin Teaspoon --  Oral - Thin Cup --  Oral - Thin Straw --  Oral - Puree --  Oral - Mech Soft --  Oral - Regular --  Oral - Multi-Consistency --  Oral - Pill --  Oral Phase - Comment --    CHL IP PHARYNGEAL PHASE 12/10/2018  Pharyngeal Phase WFL, age appropriate  Pharyngeal- Pudding Teaspoon --  Pharyngeal --  Pharyngeal- Pudding Cup --  Pharyngeal --  Pharyngeal- Honey Teaspoon --  Pharyngeal --  Pharyngeal- Honey Cup --  Pharyngeal --  Pharyngeal- Nectar Teaspoon --  Pharyngeal --  Pharyngeal- Nectar Cup --  Pharyngeal --  Pharyngeal- Nectar Straw --  Pharyngeal --  Pharyngeal- Thin Teaspoon --  Pharyngeal --  Pharyngeal- Thin Cup --  Pharyngeal --  Pharyngeal- Thin Straw --  Pharyngeal --  Pharyngeal- Puree --  Pharyngeal --  Pharyngeal- Mechanical Soft --  Pharyngeal --  Pharyngeal- Regular --  Pharyngeal --  Pharyngeal- Multi-consistency --  Pharyngeal --  Pharyngeal- Pill --  Pharyngeal --  Pharyngeal Comment --     CHL IP CERVICAL ESOPHAGEAL PHASE 12/10/2018  Cervical Esophageal Phase Impaired  Pudding Teaspoon --   Pudding Cup --  Honey Teaspoon --  Honey Cup --  Nectar Teaspoon --  Nectar Cup --  Nectar Straw --  Thin Teaspoon --  Thin Cup Other (Comment)  Thin Straw --  Puree Other (Comment)  Mechanical Soft Other (Comment)  Regular --  Multi-consistency --  Pill --  Cervical Esophageal Comment --     Emilio Math 12/10/2018, 1:55 PM

## 2018-12-11 ENCOUNTER — Encounter: Payer: Self-pay | Admitting: Gastroenterology

## 2018-12-11 ENCOUNTER — Ambulatory Visit: Payer: Medicare HMO | Admitting: Gastroenterology

## 2018-12-11 VITALS — BP 116/70 | HR 76 | Temp 97.6°F | Ht 70.0 in | Wt 163.0 lb

## 2018-12-11 DIAGNOSIS — R1314 Dysphagia, pharyngoesophageal phase: Secondary | ICD-10-CM | POA: Diagnosis not present

## 2018-12-11 NOTE — Progress Notes (Signed)
Gastroenterology Consultation  Referring Provider:     Clyde Canterbury, MD Primary Care Physician:  Venia Carbon, MD Primary Gastroenterologist:  Dr. Allen Norris     Reason for Consultation:     Dysphagia        HPI:   Walter [name] is a 85 y.o. y/o male referred for consultation & management of Dysphagia by Dr. Venia Carbon, MD.  This patient comes in today with his wife with a history of dysphagia.  The patient did have a history of a Zenker's diverticulum that has been repaired in the past.  The patient was seen by speech pathology yesterday for a modified barium swallow.  A Zenker's diverticulum was demonstrated with report that the diverticulum filled quickly with material during the study and did not clear despite multiple swallows and the use of liquid wash. The patient was recently in the hospital for a C. Difficile infection and states that he has lost significant weight and has been much weaker.  The patient also has a history of Parkinson's disease.The patient was recommended to stay on a mechanical soft diet with primary [place] even pured meat.  The patient's wife states that he only eats a few bites and starts to choke.  He was also recommended to eat multiple small meals a day numbering at least 5 to keep up his nutrition.  There is no report of any vomiting blood black stools or bloody stools.  Past Medical History:  Diagnosis Date  . Abdominal aortic aneurysm (AAA) ([place])   . ARF (acute renal failure) ([place]) 2011   after TKR  . BPH (benign prostatic hypertrophy)   . DVT (deep venous thrombosis) ([place]) 1998   after left knee arthroscopy  . Hx of colonoscopy 2000  . Hypertension   . Hypothyroid 2007   postop from thyroidectomy (cancer scare)  . Kidney stones    recurrent  . Oral cancer ([place]) 2004   graft from left thigh  . Osteoarthritis of both knees   . Parkinson's disease ([place])   . Pulmonary embolism ([place])   . Tendonitis 2012   from quinolone  . Toe amputation  status 2010    Past Surgical History:  Procedure Laterality Date  . APPENDECTOMY    . CHOLECYSTECTOMY    . CYSTOSCOPY/URETEROSCOPY/HOLMIUM LASER/STENT PLACEMENT Right 09/13/2017   Procedure: CYSTOSCOPY/URETEROSCOPY/HOLMIUM LASER/STENT PLACEMENT;  Surgeon: Hollice Espy, MD;  Location: [facility] ORS;  Service: Urology;  Laterality: Right;  . HERNIA REPAIR    . INTRAMEDULLARY (IM) NAIL INTERTROCHANTERIC Right 06/13/2017   Procedure: INTRAMEDULLARY (IM) NAIL INTERTROCHANTRIC;  Surgeon: Hessie Knows, MD;  Location: [facility] ORS;  Service: Orthopedics;  Laterality: Right;  . LITHOTRIPSY  2009/2010   then stent and cystoscopic removal 2014  . Oral cancer removed Right 2004  . REPAIR ZENKER'S DIVERTICULA  2013  . THYROIDECTOMY  2007  . TOE AMPUTATION Right 2010   badly overlapping other toe  . TOTAL KNEE ARTHROPLASTY Left   . TRANSURETHRAL RESECTION OF PROSTATE  2011  . UMBILICAL HERNIA REPAIR  2007    Prior to Admission medications   Medication Sig Start Date End Date Taking? Authorizing Provider  acetaminophen (TYLENOL) 325 MG tablet Take 2 tablets (650 mg total) by mouth every 6 (six) hours as needed for mild pain or moderate pain (or Fever >/= 101). 06/16/17  Yes Dustin Flock, MD  amLODipine (NORVASC) 5 MG tablet TAKE 1 TABLET EVERY DAY 10/03/18  Yes Venia Carbon, MD  apixaban (ELIQUIS) 2.5 MG TABS  tablet Take 1 tablet (2.5 mg total) by mouth 2 (two) times daily. 07/26/18  Yes Sindy Guadeloupe, MD  carbidopa-levodopa (SINEMET IR) 25-100 MG tablet TAKE 1 TABLET THREE TIMES DAILY Patient taking differently: Take 1 tablet by mouth 4 (four) times daily.  10/03/18  Yes Venia Carbon, MD  Cholecalciferol (VITAMIN D) 2000 UNITS CAPS Take 2,000 Units by mouth daily.    Yes [provider]  levothyroxine (SYNTHROID) 112 MCG tablet TAKE 1 TABLET EVERY DAY BEFORE BREAKFAST 10/03/18  Yes Venia Carbon, MD  vitamin B-12 (CYANOCOBALAMIN) 1000 MCG tablet Take 1,000 mcg by mouth daily.   Yes  [provider]    Family History  Problem Relation Age of Onset  . Dementia Mother   . Stroke Father   . Pulmonary disease Brother   . Heart disease Neg Hx   . Diabetes Neg Hx      Social History   Tobacco Use  . Smoking status: Former Smoker    Types: Cigars    Quit date: 04/27/2002    Years since quitting: 16.6  . Smokeless tobacco: Never Used  Substance Use Topics  . Alcohol use: Not Currently    Alcohol/week: 0.0 standard drinks  . Drug use: No    Allergies as of 12/11/2018 - Review Complete 12/11/2018  Allergen Reaction Noted  . Quinolones  07/31/2013    Review of Systems:    All systems reviewed and negative except where noted in HPI.   Physical Exam:  BP 116/70 (BP Location: Right Arm)   Pulse 76   Temp 97.6 F (36.4 C) (Oral)   Ht 5\' 10"  (1.778 m)   Wt 163 lb (73.9 kg)   BMI 23.39 kg/m  No LMP for male patient. General:   Alert,  Well-developed, well-nourished, pleasant and cooperative in NAD Head:  Normocephalic and atraumatic. Eyes:  Sclera clear, no icterus.   Conjunctiva pink. Ears:  Normal auditory acuity. Rectal:  Deferred.  Msk:  Symmetrical without gross deformities.  Good, equal movement & strength bilaterally. Extremities:  No clubbing or edema.  No cyanosis. Neurologic:  Alert and oriented x3;  grossly normal neurologically. Skin:  Intact without significant lesions or rashes.  No jaundice. Lymph Nodes:  No significant cervical adenopathy. Psych:  Alert and cooperative. Normal mood and affect.  Imaging Studies: Dg Carlena Hurl [facility] [employer] Speech Path  Result Date: 12/10/2018 Objective Swallowing Evaluation: Type of Study: MBS-Modified Barium Swallow Study  Patient Details Name: Walter [name] MRN: [id number] Date of Birth: June 01, 1931 Today's Date: 12/10/2018 Time: SLP Start Time (ACUTE ONLY): 1225 -SLP Stop Time (ACUTE ONLY): 1304 SLP Time Calculation (min) (ACUTE ONLY): 39 min Past Medical History: Past Medical History: Diagnosis Date  . Abdominal aortic aneurysm (AAA) ([place])  . ARF (acute renal failure) ([place]) 2011  after TKR . BPH (benign prostatic hypertrophy)  . DVT (deep venous thrombosis) ([place]) 1998  after left knee arthroscopy . Hx of colonoscopy 2000 . Hypertension  . Hypothyroid 2007  postop from thyroidectomy (cancer scare) . Kidney stones   recurrent . Oral cancer ([place]) 2004  graft from left thigh . Osteoarthritis of both knees  . Parkinson's disease ([place])  . Pulmonary embolism ([place])  . Tendonitis 2012  from quinolone . Toe amputation status 2010 Past Surgical History: Past Surgical History: Procedure Laterality Date . APPENDECTOMY   . CHOLECYSTECTOMY   . CYSTOSCOPY/URETEROSCOPY/HOLMIUM LASER/STENT PLACEMENT Right 09/13/2017  Procedure: CYSTOSCOPY/URETEROSCOPY/HOLMIUM LASER/STENT PLACEMENT;  Surgeon: Hollice Espy, MD;  Location: [facility] ORS;  Service:  Urology;  Laterality: Right; . HERNIA REPAIR   . INTRAMEDULLARY (IM) NAIL INTERTROCHANTERIC Right 06/13/2017  Procedure: INTRAMEDULLARY (IM) NAIL INTERTROCHANTRIC;  Surgeon: Hessie Knows, MD;  Location: [facility] ORS;  Service: Orthopedics;  Laterality: Right; . LITHOTRIPSY  2009/2010  then stent and cystoscopic removal 2014 . Oral cancer removed Right 2004 . REPAIR ZENKER'S DIVERTICULA  2013 . THYROIDECTOMY  2007 . TOE AMPUTATION Right 2010  badly overlapping other toe . TOTAL KNEE ARTHROPLASTY Left  . TRANSURETHRAL RESECTION OF PROSTATE  2011 . UMBILICAL HERNIA REPAIR  [id number] HPI: 85 year old male with history of Parkinson's, oral cancer (2004) and Zeinker's Diverticulum (2013) s/p surgery but unable to completely correct.  Referred to SLP services for OP MBS due to increased difficulty swallowing and choking episodes.   No data recorded Assessment / Plan / Recommendation CHL IP CLINICAL IMPRESSIONS 12/10/2018 Clinical Impression Pt presents with grossly intact oral swallowing function with timely manipulation and transit of boluses to the oropharynx, swallow response was age appropriate with  penetration into the laryngeal vestibule which cleared with subsequent swallows.  No aspiration evident with solids or liquids.  Most notable is the presence of a chronic zeinker's diverticulum which filled with material quickly during the study and did not clear despite multiple swallows or use of a liquid wash.   Pocketed materials from the Zeinker's during today's study remained clear of the airway but wife reports pt's "choking" episodes typically occur when pt begins eating and drinking quickly or in larger volumes.  I would suggest that pt remain on his current diet which appears to most closely resemble a mechanical soft solid diet with finely minced or even pureed meats (per wife's report).  SLP discussed compensatory strategies with pt's wife to maximize pt's comfort during PO intake.  All questions were answered to pt's and wife's satisfaction at this time.  No further ST needs indicated at this time.  SLP Visit Diagnosis Dysphagia, pharyngoesophageal phase (R13.14) Attention and concentration deficit following -- Frontal lobe and executive function deficit following -- Impact on safety and function Mild aspiration risk   CHL IP TREATMENT RECOMMENDATION 12/10/2018 Treatment Recommendations No treatment recommended at this time   Prognosis 12/10/2018 Prognosis for Safe Diet Advancement Fair Barriers to Reach Goals -- Barriers/Prognosis Comment -- CHL IP DIET RECOMMENDATION 12/10/2018 SLP Diet Recommendations Regular solids;Thin liquid (moistened solids, pureed or finely minced meats per wife's report) Liquid Administration via Cup Medication Administration Crushed with puree Compensations Minimize environmental distractions;Slow rate;Small sips/bites;Multiple dry swallows after each bite/sip;Follow solids with liquid;Hard cough after swallow Postural Changes Remain semi-upright after after feeds/meals (Comment);Seated upright at 90 degrees   CHL IP OTHER RECOMMENDATIONS 12/10/2018 Recommended Consults  Consider esophageal assessment Oral Care Recommendations Oral care BID Other Recommendations --   No flowsheet data found.  No flowsheet data found.     CHL IP ORAL PHASE 12/10/2018 Oral Phase WFL Oral - Pudding Teaspoon -- Oral - Pudding Cup -- Oral - Honey Teaspoon -- Oral - Honey Cup -- Oral - Nectar Teaspoon -- Oral - Nectar Cup -- Oral - Nectar Straw -- Oral - Thin Teaspoon -- Oral - Thin Cup -- Oral - Thin Straw -- Oral - Puree -- Oral - Mech Soft -- Oral - Regular -- Oral - Multi-Consistency -- Oral - Pill -- Oral Phase - Comment --  CHL IP PHARYNGEAL PHASE 12/10/2018 Pharyngeal Phase WFL Pharyngeal- Pudding Teaspoon -- Pharyngeal -- Pharyngeal- Pudding Cup -- Pharyngeal -- Pharyngeal- Honey Teaspoon -- Pharyngeal -- Pharyngeal- Honey Cup --  Pharyngeal -- Pharyngeal- Nectar Teaspoon -- Pharyngeal -- Pharyngeal- Nectar Cup -- Pharyngeal -- Pharyngeal- Nectar Straw -- Pharyngeal -- Pharyngeal- Thin Teaspoon -- Pharyngeal -- Pharyngeal- Thin Cup -- Pharyngeal -- Pharyngeal- Thin Straw -- Pharyngeal -- Pharyngeal- Puree -- Pharyngeal -- Pharyngeal- Mechanical Soft -- Pharyngeal -- Pharyngeal- Regular -- Pharyngeal -- Pharyngeal- Multi-consistency -- Pharyngeal -- Pharyngeal- Pill -- Pharyngeal -- Pharyngeal Comment --  CHL IP CERVICAL ESOPHAGEAL PHASE 12/10/2018 Cervical Esophageal Phase Impaired Pudding Teaspoon -- Pudding Cup -- Honey Teaspoon -- Honey Cup -- Nectar Teaspoon -- Nectar Cup -- Nectar Straw -- Thin Teaspoon -- Thin Cup Other (Comment) Thin Straw -- Puree Other (Comment) Mechanical Soft Other (Comment) Regular -- Multi-consistency -- Pill -- Cervical Esophageal Comment -- Emilio Math 12/10/2018, 1:57 PM            CLINICAL DATA:  Dysphagia. Cough/GE reflux disease/other secondary diagnosis EXAM: MODIFIED BARIUM SWALLOW TECHNIQUE: Different consistencies of barium were administered orally to the patient by the Speech Pathologist. Imaging of the pharynx was performed in the lateral projection. The  radiologist was present in the fluoroscopy room for this study, providing personal supervision. FLUOROSCOPY TIME:  Fluoroscopy Time:  2 minutes 44 seconds Number of Acquired Spot Images: 0 COMPARISON:  12/11/2015 FINDINGS: Scout images demonstrate spinal degenerative changes as before. Swallow study performed in conjunction with speech pathology. Swallowing performed with thin liquids and puree consistency. There was penetration without aspiration common normal soft palate elevation, hyoid elevation and epiglottic inversion. Signs of residual Zenker's diverticulum not significantly changed dating back to 2017. IMPRESSION: No significant swallow dysfunction with evidence of Zenker's diverticulum as before. Please refer to the Speech Pathologists report for complete details and recommendations. Electronically Signed   By: Zetta Bills M.D.   On: 12/10/2018 13:33    Assessment and Plan:   Walter [name] is a 85 y.o. y/o male who comes in today with dysphagia.  The patient's dysphagia is likely multifactorial and I have discussed this with the patient and his wife.  I have also called the patient's son who works at [employer] to discuss my findings with him. The patient has dysphagia likely being contributed by his Zenker's diverticula him, his Parkinson's and his general weakness due to weight loss and recurrent illness.  The patient has been told that he needs to keep up his nutrition and try to avoid any further weight loss.  Loss of body mass and muscle mass will only worsen his dysphagia.  The patient has also been told about the possibility of PEG tube feedings if he does not get better or starts to aspirate which may be a real possibility if he continues to lose weight and muscle mass.  The patient is wife and his son report that they understand the plan and that there is nothing endoscopically that can be done to the patient has been explained the plan and agrees with it.  This visit  consisted of 45 minutes face to face contact with myself and at least 80% of this time was spent in counseling and education regarding diagnosis, treatment options, medication management, risks and benefits of treatment.  Lucilla Lame, MD. Marval Regal    Note: This dictation was prepared with Dragon dictation along with smaller phrase technology. Any transcriptional errors that result from this process are unintentional.

## 2018-12-12 DIAGNOSIS — I129 Hypertensive chronic kidney disease with stage 1 through stage 4 chronic kidney disease, or unspecified chronic kidney disease: Secondary | ICD-10-CM | POA: Diagnosis not present

## 2018-12-12 DIAGNOSIS — G20 Parkinson's disease: Secondary | ICD-10-CM | POA: Diagnosis not present

## 2018-12-12 DIAGNOSIS — N400 Enlarged prostate without lower urinary tract symptoms: Secondary | ICD-10-CM | POA: Diagnosis not present

## 2018-12-12 DIAGNOSIS — I714 Abdominal aortic aneurysm, without rupture: Secondary | ICD-10-CM | POA: Diagnosis not present

## 2018-12-12 DIAGNOSIS — E538 Deficiency of other specified B group vitamins: Secondary | ICD-10-CM | POA: Diagnosis not present

## 2018-12-12 DIAGNOSIS — I2782 Chronic pulmonary embolism: Secondary | ICD-10-CM | POA: Diagnosis not present

## 2018-12-12 DIAGNOSIS — N183 Chronic kidney disease, stage 3 unspecified: Secondary | ICD-10-CM | POA: Diagnosis not present

## 2018-12-12 DIAGNOSIS — M1711 Unilateral primary osteoarthritis, right knee: Secondary | ICD-10-CM | POA: Diagnosis not present

## 2018-12-12 DIAGNOSIS — E039 Hypothyroidism, unspecified: Secondary | ICD-10-CM | POA: Diagnosis not present

## 2018-12-13 ENCOUNTER — Ambulatory Visit (INDEPENDENT_AMBULATORY_CARE_PROVIDER_SITE_OTHER): Payer: Medicare HMO | Admitting: Family Medicine

## 2018-12-13 ENCOUNTER — Telehealth: Payer: Self-pay

## 2018-12-13 ENCOUNTER — Encounter: Payer: Self-pay | Admitting: Family Medicine

## 2018-12-13 DIAGNOSIS — R197 Diarrhea, unspecified: Secondary | ICD-10-CM

## 2018-12-13 MED ORDER — FIDAXOMICIN 200 MG PO TABS: 200.0000 mg | ORAL_TABLET | Freq: Two times a day (BID) | ORAL | 0 refills | Status: DC

## 2018-12-13 NOTE — Progress Notes (Signed)
Virtual Visit via Video Note  I connected with Walter Walter [name] and Walter Walter [name] on 12/13/18 at 10:00 AM EDT by a telephone enabled telemedicine application and verified that I am speaking with the correct person using two identifiers.  Location: Patient: home Walter Walter [name])  Provider: office Loura Pardon MD -treating provider) The 3 above people are participants in today's encounter   I discussed the limitations of evaluation and management by telemedicine and the availability of in person appointments. The patient expressed understanding and agreed to proceed.  History of Walter [name] Illness: 85 yo pt of Dr Walter Walter [name] presents with diarrhea in context of recent hosp for c diff  Most recently finished abx on 9/30   Loose bm-this am (soft-not watery) -no blood or mucous in stool  No fever or chills No abd pain  Is just feeling worn out  Not lightheaded right now  No n/v   Temp is normal   Lab Results  Component Value Date   CREATININE 1.02 11/13/2018   BUN 10 11/13/2018   NA 141 11/13/2018   K 3.6 11/13/2018   CL 110 11/13/2018   CO2 25 11/13/2018   Lab Results  Component Value Date   WBC 7.7 11/12/2018   HGB 12.4 (L) 11/12/2018   HCT 37.3 (L) 11/12/2018   MCV 91.6 11/12/2018   PLT 165 11/12/2018    bp avg 117/70 -baseline (did take off bp medicine)  She can have this checked any time by nursing staff at [facility]    Has lost about 16 lb since august  Unable to gain any weight back   He also has hx of dyphagia and parkinson's dz Ate very little yesterday  He is able to take fluids with no problems    He was hospitalized from 11/10/18 to 11/13/18 for treatment and supportive care of c diff colitis with aki and fever as well as elevated wbc ct This was a recurrence from the month before  He was tx with vancomycin and IV fluids with resolution of symptoms- tx with vanco lasted 14 d   It was recommended by  discharging physician that in the event of another re occurrence her be changed to dificid and referred to GI outpt for consideration of stool transplant   He has seen GI in the interim re: dysphagia with Zenker's diverticulum and parkinsons  The possibility of a PEG tube was discussed    Patient Active Problem List   Diagnosis Date Noted  . Diarrhea 12/13/2018  . Colitis 11/11/2018  . Pressure injury of skin 11/11/2018  . Dysphagia 10/02/2018  . Heat exhaustion 09/14/2018  . Abdominal aortic aneurysm (AAA) ([place])   . RBD (REM behavioral disorder) 10/24/2017  . Advance directive discussed with patient 11/01/2016  . Renal cyst, right 06/06/2016  . Acute deep vein thrombosis (DVT) of popliteal vein of right lower extremity ([place]) 04/28/2016  . CKD (chronic kidney disease) stage 3, GFR 30-59 ml/min 03/05/2016  . Pulmonary embolism ([place]) 03/03/2016  . Vitamin B12 deficiency 09/09/2014  . Preventative health care 08/04/2014  . Parkinson's disease ([place]) 08/04/2014  . Esophageal diverticulum 01/01/2014  . Actinic keratosis 07/31/2013  . Hypertension   . Osteoarthritis of both knees   . Toe amputation status   . Benign prostatic hyperplasia   . Kidney stones   . Hypothyroid   . Tendonitis    Past Medical History:  Diagnosis Date  . Abdominal aortic aneurysm (AAA) (  [place])   . ARF (acute renal failure) ([place]) 2011   after TKR  . BPH (benign prostatic hypertrophy)   . DVT (deep venous thrombosis) ([place]) 1998   after left knee arthroscopy  . Hx of colonoscopy 2000  . Hypertension   . Hypothyroid 2007   postop from thyroidectomy (cancer scare)  . Kidney stones    recurrent  . Oral cancer ([place]) 2004   graft from left thigh  . Osteoarthritis of both knees   . Parkinson's disease ([place])   . Pulmonary embolism ([place])   . Tendonitis 2012   from quinolone  . Toe amputation status 2010   Past Surgical History:  Procedure Laterality Date  . APPENDECTOMY    . CHOLECYSTECTOMY    .  CYSTOSCOPY/URETEROSCOPY/HOLMIUM LASER/STENT PLACEMENT Right 09/13/2017   Procedure: CYSTOSCOPY/URETEROSCOPY/HOLMIUM LASER/STENT PLACEMENT;  Surgeon: Hollice Espy, MD;  Location: [facility] ORS;  Service: Urology;  Laterality: Right;  . HERNIA REPAIR    . INTRAMEDULLARY (IM) NAIL INTERTROCHANTERIC Right 06/13/2017   Procedure: INTRAMEDULLARY (IM) NAIL INTERTROCHANTRIC;  Surgeon: Hessie Knows, MD;  Location: [facility] ORS;  Service: Orthopedics;  Laterality: Right;  . LITHOTRIPSY  2009/2010   then stent and cystoscopic removal 2014  . Oral cancer removed Right 2004  . REPAIR ZENKER'S DIVERTICULA  2013  . THYROIDECTOMY  2007  . TOE AMPUTATION Right 2010   badly overlapping other toe  . TOTAL KNEE ARTHROPLASTY Left   . TRANSURETHRAL RESECTION OF PROSTATE  2011  . UMBILICAL HERNIA REPAIR  2007   Social History   Tobacco Use  . Smoking status: Former Smoker    Types: Cigars    Quit date: 04/27/2002    Years since quitting: 16.6  . Smokeless tobacco: Never Used  Substance Use Topics  . Alcohol use: Not Currently    Alcohol/week: 0.0 standard drinks  . Drug use: No   Family History  Problem Relation Age of Onset  . Dementia Mother   . Stroke Father   . Pulmonary disease Brother   . Heart disease Neg Hx   . Diabetes Neg Hx    Allergies  Allergen Reactions  . Quinolones     tendonitis   Current Outpatient Medications on File Prior to Visit  Medication Sig Dispense Refill  . acetaminophen (TYLENOL) 325 MG tablet Take 2 tablets (650 mg total) by mouth every 6 (six) hours as needed for mild pain or moderate pain (or Fever >/= 101).    Marland Kitchen amLODipine (NORVASC) 5 MG tablet TAKE 1 TABLET EVERY DAY 90 tablet 3  . apixaban (ELIQUIS) 2.5 MG TABS tablet Take 1 tablet (2.5 mg total) by mouth 2 (two) times daily. 180 tablet 3  . carbidopa-levodopa (SINEMET IR) 25-100 MG tablet TAKE 1 TABLET THREE TIMES DAILY (Patient taking differently: Take 1 tablet by mouth 4 (four) times daily. ) 270 tablet 3  .  Cholecalciferol (VITAMIN D) 2000 UNITS CAPS Take 2,000 Units by mouth daily.     Marland Kitchen levothyroxine (SYNTHROID) 112 MCG tablet TAKE 1 TABLET EVERY DAY BEFORE BREAKFAST 90 tablet 3  . vitamin B-12 (CYANOCOBALAMIN) 1000 MCG tablet Take 1,000 mcg by mouth daily.     No current facility-administered medications on file prior to visit.    Review of Systems  Constitutional: Positive for malaise/fatigue and weight loss. Negative for chills and fever.       Fatigued   HENT: Negative for congestion, ear pain, sinus pain and sore throat.   Eyes: Negative for blurred vision, discharge and redness.  Respiratory: Negative for cough, shortness of breath and stridor.   Cardiovascular: Negative for chest pain, palpitations and leg swelling.  Gastrointestinal: Positive for diarrhea. Negative for abdominal pain, blood in stool, heartburn, melena, nausea and vomiting.  Genitourinary: Negative for dysuria.  Musculoskeletal: Negative for myalgias.  Skin: Negative for rash.  Neurological: Positive for tremors. Negative for dizziness and headaches.       Movement disorder-parkinsons    Observations/Objective: I spoke with patient's wife today  She states Mr Sokalski appears his normal self (thin with recent wt loss) and with normal skin tone He is resting and in no distress  No new neurologic changes and pt denies dizziness or discomfort at the moment    Assessment and Plan: Problem List Items Addressed This Visit      Other   Diarrhea    In pt with 2 recent episodes of c diff  Reviewed hospital records, lab results and studies in detail    So far today one very loose stool (not his usual) , w/o fever/dizziness/ abdominal pain or signs of dehydration He is taking fluids fine (only has trouble with solids)   Sent dificid to pharmacy for tx of potential c diff Wife is unsure if he would be open to fecal transplant in the future  She will pick up collection equip for c diff test (understanding that this may  remain positive up to 6 weeks after infection but if negative would rule out)  Once stool sample given (if symptoms persist) she will begin the abx and keep in close contact with [place]  inst to call if any new symptoms or worsening  Disc red flags to go to ER (dizzy/abd pain/fever)        Relevant Orders   C. difficile GDH and Toxin A/B       Follow Up Instructions: Come pick up stool collection materials and bring back sample  Once stool is collected -pick up the px dificid and begin taking twice daily for 10 d If symptoms stop/do not return before beginning this let us know   If nausea/vomiting/abdominal pain or fever or signs of dehydration go to ER please and alert [place]  Continue a good fluid intake    I discussed the assessment and treatment plan with the patient. The patient was provided an opportunity to ask questions and all were answered. The patient agreed with the plan and demonstrated an understanding of the instructions.   The patient was advised to call back or seek an in-person evaluation if the symptoms worsen or if the condition fails to improve as anticipated.  I provided 29 minutes of non-face-to-face time during this encounter.   Loura Pardon, MD

## 2018-12-13 NOTE — Assessment & Plan Note (Signed)
In pt with 2 recent episodes of c diff  Reviewed hospital records, lab results and studies in detail    So far today one very loose stool (not his usual) , w/o fever/dizziness/ abdominal pain or signs of dehydration He is taking fluids fine (only has trouble with solids)   Sent dificid to pharmacy for tx of potential c diff Wife is unsure if he would be open to fecal transplant in the future  She will pick up collection equip for c diff test (understanding that this may remain positive up to 6 weeks after infection but if negative would rule out)  Once stool sample given (if symptoms persist) she will begin the abx and keep in close contact with [place]  inst to call if any new symptoms or worsening  Disc red flags to go to ER (dizzy/abd pain/fever)

## 2018-12-13 NOTE — Telephone Encounter (Signed)
Seen

## 2018-12-13 NOTE — Patient Instructions (Signed)
Come pick up stool collection materials and bring back sample  Once stool is collected -pick up the px dificid and begin taking twice daily for 10 d If symptoms stop/do not return before beginning this let us know   If nausea/vomiting/abdominal pain or fever or signs of dehydration go to ER please and alert [place]  Continue a good fluid intake

## 2018-12-13 NOTE — Telephone Encounter (Signed)
Mrs Armando [facility] signed) said that pt recently dx with c diff and finished abx on 11/28/18; this morning pt had loose BM and Mrs Verhoeven concerned due to c diff. No fever or chills and no abd pain. Scheduled virtual appt with Dr Glori Bickers today at 10 AM and Mrs Noble will have wt and temp when CMA calls.

## 2018-12-14 ENCOUNTER — Other Ambulatory Visit: Payer: Self-pay | Admitting: *Deleted

## 2018-12-14 DIAGNOSIS — M1711 Unilateral primary osteoarthritis, right knee: Secondary | ICD-10-CM | POA: Diagnosis not present

## 2018-12-14 DIAGNOSIS — I2782 Chronic pulmonary embolism: Secondary | ICD-10-CM | POA: Diagnosis not present

## 2018-12-14 DIAGNOSIS — E039 Hypothyroidism, unspecified: Secondary | ICD-10-CM | POA: Diagnosis not present

## 2018-12-14 DIAGNOSIS — N183 Chronic kidney disease, stage 3 unspecified: Secondary | ICD-10-CM | POA: Diagnosis not present

## 2018-12-14 DIAGNOSIS — G20 Parkinson's disease: Secondary | ICD-10-CM | POA: Diagnosis not present

## 2018-12-14 DIAGNOSIS — E538 Deficiency of other specified B group vitamins: Secondary | ICD-10-CM | POA: Diagnosis not present

## 2018-12-14 DIAGNOSIS — N400 Enlarged prostate without lower urinary tract symptoms: Secondary | ICD-10-CM | POA: Diagnosis not present

## 2018-12-14 DIAGNOSIS — I129 Hypertensive chronic kidney disease with stage 1 through stage 4 chronic kidney disease, or unspecified chronic kidney disease: Secondary | ICD-10-CM | POA: Diagnosis not present

## 2018-12-14 DIAGNOSIS — I714 Abdominal aortic aneurysm, without rupture: Secondary | ICD-10-CM | POA: Diagnosis not present

## 2018-12-14 NOTE — Telephone Encounter (Signed)
Thanks for letting me know Will cc to PCP

## 2018-12-14 NOTE — Telephone Encounter (Signed)
Called pt and he said he is the "Walter [name]" as yesterday but when I asked him how the diarrhea was he said he hasn't had any more diarrhea episodes since his phone call with Dr. Glori Bickers pt said he is still doing his "exercising" but his main complaint is he is having a "hard time swallowing" he said this isn't new but it's still bothering him

## 2018-12-14 NOTE — Telephone Encounter (Signed)
-----   Message from Abner Greenspan, MD sent at 12/13/2018 10:39 AM EDT ----- Pt's wife is coming in back door today to pick up stool collection supplies for c diff test Please call tomorrow to check on himCc to PCP

## 2018-12-15 ENCOUNTER — Encounter: Payer: Self-pay | Admitting: Family Medicine

## 2018-12-15 MED ORDER — VANCOMYCIN 50 MG/ML ORAL SOLUTION: 125.0000 mg | Freq: Four times a day (QID) | ORAL | 0 refills | Status: DC

## 2018-12-15 NOTE — Telephone Encounter (Signed)
Walter [name] is aware and will call the pharmacy

## 2018-12-15 NOTE — Addendum Note (Signed)
Addended by: Lurlean Nanny on: 12/15/2018 12:46 PM   Modules accepted: Orders

## 2018-12-15 NOTE — Telephone Encounter (Signed)
Please cancel the dificid I sent in during our encounter since it is too $$  I send the vancomycin electronically Thanks!

## 2018-12-15 NOTE — Telephone Encounter (Signed)
Spoke with pt's wife and the pharmacy No one can find vanco liquid so we subs capsules If he cannot swallow them- he can break open the capsule and mix with food or drink  (the pharmacist said this was ok)   She is going to pick up the px now  Walter [name] is stable - feeling about the same   They will check in with the office this week

## 2018-12-15 NOTE — Telephone Encounter (Signed)
A positive C diff will be of limited utility. If he has ongoing diarrhea, I will restart his Rx with vancomycin orally

## 2018-12-17 DIAGNOSIS — K225 Diverticulum of esophagus, acquired: Secondary | ICD-10-CM | POA: Diagnosis not present

## 2018-12-17 DIAGNOSIS — R1314 Dysphagia, pharyngoesophageal phase: Secondary | ICD-10-CM | POA: Diagnosis not present

## 2018-12-17 NOTE — Telephone Encounter (Signed)
[name] - Client TELEPHONE ADVICE RECORD [facility] Patient Name: Walter [name] Gender: Male DOB: [birth year] Age: 85 Y 9 M 16 D Return Phone Number: [phone] (Primary) Address: City/State/Zip: [place] [address] Client [facility] - Client Client Site [place] Physician Viviana Simpler - MD Contact Type Call Who Is Calling Patient / Member / Family / Caregiver Call Type Triage / Clinical Caller Name Walter [name] Relationship To Patient Spouse Return Phone Number [phone] (Primary) Chief Complaint Diarrhea Reason for Call Symptomatic / Request for [place] states her husband is having a bout of diarrhea and she is needing to see about getting a refill on his Vancomycin. Caller states no fever or abdominal pain. Translation No Nurse Assessment Nurse: Toribio Harbour, RN, Joelene Millin Date/Time (Eastern Time): 12/15/2018 12:11:27 PM Confirm and document reason for call. If symptomatic, describe symptoms. ---Caller states husband was taking vancomycin until 9/30 for c- Diff. He has had 2 episodes of diarrhea today. She would like to have the Vancomycin ordered for him again. Has the patient had close contact with a person known or suspected to have the novel coronavirus illness OR traveled / lives in area with major community spread (including international travel) in the last 14 days from the onset of symptoms? * If Asymptomatic, screen for exposure and travel within the last 14 days. ---No Does the patient have any new or worsening symptoms? ---Yes Will a triage be completed? ---Yes Related visit to physician within the last 2 weeks? ---Yes Does the PT have any chronic conditions? (i.e. diabetes, asthma, this includes High risk factors for pregnancy, etc.) ---Yes List chronic conditions. ---C-Diff, Parkinson's Is this a behavioral health or substance abuse  call? ---No Guidelines Guideline Title Affirmed Question Affirmed Notes Nurse Date/Time (Eastern Time) Diarrhea Weak immune system (e.g., HIV positive, cancer chemo, splenectomy, Daves, RN, Joelene Millin 12/15/2018 12:15:32 PM PLEASE NOTE: All timestamps contained within this report are represented as [place] Standard Time. CONFIDENTIALTY NOTICE: This fax transmission is intended only for the addressee. It contains information that is legally privileged, confidential or otherwise protected from use or disclosure. If you are not the intended recipient, you are strictly prohibited from reviewing, disclosing, copying using or disseminating any of this information or taking any action in reliance on or regarding this information. If you have received this fax in error, please notify us immediately by telephone so that we can arrange for its return to [place]. Phone: [phone], Toll-Free: [phone], Fax: [phone] Page: 2 of 2 Call Id: [id number] Guidelines Guideline Title Affirmed Question Affirmed Notes Nurse Date/Time Eilene Ghazi Time) organ transplant, chronic steroids) Disp. Time Eilene Ghazi Time) Disposition Final User 12/15/2018 12:19:36 PM See PCP within 24 Hours Yes Toribio Harbour, RN, Renea Ee Disagree/Comply Comply Caller Understands Yes PreDisposition Call Doctor Care Advice Given Per Guideline SEE PCP WITHIN 24 HOURS: * IF OFFICE WILL BE CLOSED AND NO PCP (PRIMARY CARE PROVIDER) SECONDLEVEL TRIAGE: You need to be seen within the next 24 hours. A clinic or an urgent care center is often a good source of care if your doctor's office is closed or you can't get an appointment. * Drink more fluids, at least 8-10 cups daily. One cup equals 8 oz (240 ml). * SPORTS DRINKS: You can also drink a sports drinks (e.g., Gatorade, Powerade) to help treat and prevent dehydration. For it to work best, mix it half and half with water. * Avoid caffeinated beverages (Reason: caffeine is  mildly dehydrating).  * WATER: For mild to moderate diarrhea, water is often the best liquid to drink. You should also eat some salty foods (e.g., potato chips, pretzels, saltine crackers). This is important to make sure you are getting enough salt, sugars, and fluids to meet your body's needs. * Signs of dehydration occur (e.g., no urine over 12 hours, very dry mouth, lightheaded, etc.) CALL BACK IF: * Bloody stools * Constant or severe abdominal pain * You become worse. CARE ADVICE given per Diarrhea (Adult) guideline. Referrals [facility]

## 2018-12-17 NOTE — Telephone Encounter (Signed)
[place] Night - Client TELEPHONE ADVICE RECORD [facility] Patient Name: Walter [name] Gender: Male DOB: [birth year] Age: 85 Y 9 M 16 D Return Phone Number: [phone] (Primary) Address: City/State/Zip: [place] [address] Client [facility] - Client Client Site [place] Physician Viviana Simpler - MD Contact Type Call Who Is Calling Patient / Member / Family / Caregiver Call Type Triage / Clinical Caller Name Nikolaus Malecki Relationship To Patient Spouse Return Phone Number [phone] (Primary) Chief Complaint Diarrhea Reason for Call Request to Speak to a Physician Initial Comment Caller states they were in touch with a nurse earlier today. States MD was going to [facility] but that has not happened. States he has diarrhea. States he has had urine output. Translation No No Triage Reason Patient declined Nurse Assessment Nurse: Freddie Apley, RN, Malachi Paradise Date/Time (Eastern Time): 12/15/2018 5:21:01 PM Confirm and document reason for call. If symptomatic, describe symptoms. ---Caller states that she called earlier today and was told that an antibiotic would be sent in, patient called two hours ago and there wasn't a prescription in. Has the patient had close contact with a person known or suspected to have the novel coronavirus illness OR traveled / lives in area with major community spread (including international travel) in the last 14 days from the onset of symptoms? * If Asymptomatic, screen for exposure and travel within the last 14 days. ---No Does the patient have any new or worsening symptoms? ---Yes Will a triage be completed? ---No Select reason for no triage. ---Patient declined Nurse: Freddie Apley, RN, Malachi Paradise Date/Time (Eastern Time): 12/15/2018 6:18:20 PM Please select the assessment type ---RX called in but not at pharm Additional Documentation ---patient was suppose to be  prescribed an antibiotic Document the name of the medication. ---vancomycin Pharmacy name and phone number[phone] Has the office closed within the last 30 minutes? ---No Does the client directives allow for assistance with medications after hours? ---Yes PLEASE NOTE: All timestamps contained within this report are represented as [place] Standard Time. CONFIDENTIALTY NOTICE: This fax transmission is intended only for the addressee. It contains information that is legally privileged, confidential or otherwise protected from use or disclosure. If you are not the intended recipient, you are strictly prohibited from reviewing, disclosing, copying using or disseminating any of this information or taking any action in reliance on or regarding this information. If you have received this fax in error, please notify us immediately by telephone so that we can arrange for its return to [place]. Phone: [phone], Toll-Free: [phone], Fax: [phone] Page: 2 of 2 Call Id: [id number] Nurse Assessment Is there an on-call physician for the client? ---Yes Additional Documentation ---provider called in perscription Guidelines Guideline Title Affirmed Question Affirmed Notes Nurse Date/Time Eilene Ghazi Time) Disp. Time Eilene Ghazi Time) Disposition Final User 12/15/2018 5:30:29 PM Paged On Call back to [facility], [place], Malachi Paradise 12/15/2018 5:31:34 PM Paged On Call back to [facility], [place], Malachi Paradise 12/15/2018 6:01:09 PM Paged On Call back to [facility], [place] 12/15/2018 6:23:29 PM Clinical Call Yes Freddie Apley, RN, [facility] Phone DateTime Result/Outcome Message Type Notes Loura Pardon - [place] [id number] 12/15/2018 5:30:29 PM Called On Call Provider - Left Message Doctor Paged Loura Pardon - MD [id number] 12/15/2018 5:31:34 PM Paged On Call Back to Call Center Doctor Paged I CANNOT PUT [place] Patient's wife has called again about the  antibiotic that was suppose  to be perscribed for her husbands possible C-DIFF. Loura Pardon - [place] [id number] 12/15/2018 6:01:09 PM Paged On Call Back to Call Center Doctor Paged I CANNOT PUT [place] Patient's wife has called again about the antibiotic that was suppose to be perscribed for her husbands possible C-DIFF. Please respond via page or call if you have received this page. Loura Pardon - MD 12/15/2018 6:16:21 PM Spoke with On Call - General Message Result on-call provider called in the perscription and called the patients wife

## 2018-12-18 DIAGNOSIS — I2782 Chronic pulmonary embolism: Secondary | ICD-10-CM | POA: Diagnosis not present

## 2018-12-18 DIAGNOSIS — E039 Hypothyroidism, unspecified: Secondary | ICD-10-CM | POA: Diagnosis not present

## 2018-12-18 DIAGNOSIS — N400 Enlarged prostate without lower urinary tract symptoms: Secondary | ICD-10-CM | POA: Diagnosis not present

## 2018-12-18 DIAGNOSIS — N183 Chronic kidney disease, stage 3 unspecified: Secondary | ICD-10-CM | POA: Diagnosis not present

## 2018-12-18 DIAGNOSIS — I714 Abdominal aortic aneurysm, without rupture: Secondary | ICD-10-CM | POA: Diagnosis not present

## 2018-12-18 DIAGNOSIS — M1711 Unilateral primary osteoarthritis, right knee: Secondary | ICD-10-CM | POA: Diagnosis not present

## 2018-12-18 DIAGNOSIS — I129 Hypertensive chronic kidney disease with stage 1 through stage 4 chronic kidney disease, or unspecified chronic kidney disease: Secondary | ICD-10-CM | POA: Diagnosis not present

## 2018-12-18 DIAGNOSIS — G20 Parkinson's disease: Secondary | ICD-10-CM | POA: Diagnosis not present

## 2018-12-18 DIAGNOSIS — E538 Deficiency of other specified B group vitamins: Secondary | ICD-10-CM | POA: Diagnosis not present

## 2018-12-19 ENCOUNTER — Emergency Department: Payer: Medicare HMO

## 2018-12-19 ENCOUNTER — Emergency Department
Admission: EM | Admit: 2018-12-19 | Discharge: 2018-12-19 | Disposition: A | Discharge status: Home / Self Care | Payer: Medicare HMO | Attending: Emergency Medicine | Admitting: Emergency Medicine

## 2018-12-19 ENCOUNTER — Other Ambulatory Visit: Payer: Self-pay

## 2018-12-19 DIAGNOSIS — E860 Dehydration: Secondary | ICD-10-CM | POA: Diagnosis not present

## 2018-12-19 DIAGNOSIS — Z85819 Personal history of malignant neoplasm of unspecified site of lip, oral cavity, and pharynx: Secondary | ICD-10-CM | POA: Insufficient documentation

## 2018-12-19 DIAGNOSIS — Z7901 Long term (current) use of anticoagulants: Secondary | ICD-10-CM | POA: Diagnosis not present

## 2018-12-19 DIAGNOSIS — N179 Acute kidney failure, unspecified: Secondary | ICD-10-CM | POA: Diagnosis not present

## 2018-12-19 DIAGNOSIS — R531 Weakness: Secondary | ICD-10-CM | POA: Diagnosis not present

## 2018-12-19 DIAGNOSIS — R630 Anorexia: Secondary | ICD-10-CM | POA: Diagnosis present

## 2018-12-19 DIAGNOSIS — Z96652 Presence of left artificial knee joint: Secondary | ICD-10-CM | POA: Diagnosis not present

## 2018-12-19 DIAGNOSIS — I129 Hypertensive chronic kidney disease with stage 1 through stage 4 chronic kidney disease, or unspecified chronic kidney disease: Secondary | ICD-10-CM | POA: Insufficient documentation

## 2018-12-19 DIAGNOSIS — Z87891 Personal history of nicotine dependence: Secondary | ICD-10-CM | POA: Diagnosis not present

## 2018-12-19 DIAGNOSIS — E039 Hypothyroidism, unspecified: Secondary | ICD-10-CM | POA: Insufficient documentation

## 2018-12-19 DIAGNOSIS — G20 Parkinson's disease: Secondary | ICD-10-CM | POA: Diagnosis not present

## 2018-12-19 DIAGNOSIS — I10 Essential (primary) hypertension: Secondary | ICD-10-CM | POA: Diagnosis not present

## 2018-12-19 DIAGNOSIS — N183 Chronic kidney disease, stage 3 unspecified: Secondary | ICD-10-CM | POA: Insufficient documentation

## 2018-12-19 DIAGNOSIS — R42 Dizziness and giddiness: Secondary | ICD-10-CM | POA: Diagnosis not present

## 2018-12-19 DIAGNOSIS — Z79899 Other long term (current) drug therapy: Secondary | ICD-10-CM | POA: Insufficient documentation

## 2018-12-19 HISTORY — DX: Enterocolitis due to Clostridium difficile, not specified as recurrent: A04.72

## 2018-12-19 LAB — CBC WITH DIFFERENTIAL/PLATELET
Abs Immature Granulocytes: 0.01 10*3/uL (ref 0.00–0.07)
Basophils Absolute: 0 10*3/uL (ref 0.0–0.1)
Basophils Relative: 1 %
Eosinophils Absolute: 0.1 10*3/uL (ref 0.0–0.5)
Eosinophils Relative: 2 %
HCT: 44.8 % (ref 39.0–52.0)
Hemoglobin: 14.2 g/dL (ref 13.0–17.0)
Immature Granulocytes: 0 %
Lymphocytes Relative: 23 %
Lymphs Abs: 1.3 10*3/uL (ref 0.7–4.0)
MCH: 29.2 pg (ref 26.0–34.0)
MCHC: 31.7 g/dL (ref 30.0–36.0)
MCV: 92.2 fL (ref 80.0–100.0)
Monocytes Absolute: 0.5 10*3/uL (ref 0.1–1.0)
Monocytes Relative: 9 %
Neutro Abs: 3.6 10*3/uL (ref 1.7–7.7)
Neutrophils Relative %: 65 %
Platelets: 170 10*3/uL (ref 150–400)
RBC: 4.86 MIL/uL (ref 4.22–5.81)
RDW: 13.1 % (ref 11.5–15.5)
WBC: 5.6 10*3/uL (ref 4.0–10.5)
nRBC: 0 % (ref 0.0–0.2)

## 2018-12-19 LAB — URINALYSIS, ROUTINE W REFLEX MICROSCOPIC
Bacteria, UA: NONE SEEN
Bilirubin Urine: NEGATIVE
Glucose, UA: NEGATIVE mg/dL
Ketones, ur: 5 mg/dL — AB
Leukocytes,Ua: NEGATIVE
Nitrite: NEGATIVE
Protein, ur: NEGATIVE mg/dL
Specific Gravity, Urine: 1.012 (ref 1.005–1.030)
pH: 7 (ref 5.0–8.0)

## 2018-12-19 LAB — COMPREHENSIVE METABOLIC PANEL
ALT: 10 U/L (ref 0–44)
AST: 16 U/L (ref 15–41)
Albumin: 3.9 g/dL (ref 3.5–5.0)
Alkaline Phosphatase: 70 U/L (ref 38–126)
Anion gap: 9 (ref 5–15)
BUN: 17 mg/dL (ref 8–23)
CO2: 28 mmol/L (ref 22–32)
Calcium: 9.3 mg/dL (ref 8.9–10.3)
Chloride: 105 mmol/L (ref 98–111)
Creatinine, Ser: 1.49 mg/dL — ABNORMAL HIGH (ref 0.61–1.24)
GFR calc Af Amer: 49 mL/min — ABNORMAL LOW (ref >60)
GFR calc non Af Amer: 42 mL/min — ABNORMAL LOW (ref >60)
Glucose, Bld: 92 mg/dL (ref 70–99)
Potassium: 3.9 mmol/L (ref 3.5–5.1)
Sodium: 142 mmol/L (ref 135–145)
Total Bilirubin: 1.3 mg/dL — ABNORMAL HIGH (ref 0.3–1.2)
Total Protein: 7 g/dL (ref 6.5–8.1)

## 2018-12-19 LAB — MAGNESIUM: Magnesium: 2.2 mg/dL (ref 1.7–2.4)

## 2018-12-19 LAB — TROPONIN I (HIGH SENSITIVITY): Troponin I (High Sensitivity): 21 ng/L — ABNORMAL HIGH (ref <18)

## 2018-12-19 MED ORDER — SODIUM CHLORIDE 0.9 % IV BOLUS
1000.0000 mL | Freq: Once | INTRAVENOUS | Status: AC
  Administered 2018-12-19: 18:00:00 1000 mL via INTRAVENOUS

## 2018-12-19 MED ORDER — SODIUM CHLORIDE 0.9 % IV BOLUS
500.0000 mL | Freq: Once | INTRAVENOUS | Status: AC
  Administered 2018-12-19: 500 mL via INTRAVENOUS

## 2018-12-19 NOTE — ED Provider Notes (Signed)
[facility] Emergency Department Provider Note  ____________________________________________   First MD Initiated Contact with Patient 12/19/18 1658     (approximate)  I have reviewed the triage vital signs and the nursing notes.   HISTORY  Chief Complaint Failure To Thrive    HPI Walter [name] is a 85 y.o. male with aortic aneurysm, DVT on Eliquis, hypertension who presents with [facility] [place] independent living with decreased appetite and weakness.  Patient is currently on oral vancomycin for C. difficile infection.  He continues to take antibiotics for that.  He denies any current abdominal pain but he just feeling extremely weak and not able to eat.  He feels very lethargic that is severe, constant, nothing makes it better, nothing makes it worse.  Denies any chest pain, shortness of breath.  He denies hitting his head or having any falls in the past week.           Past Medical History:  Diagnosis Date  . Abdominal aortic aneurysm (AAA) ([place])   . ARF (acute renal failure) ([place]) 2011   after TKR  . BPH (benign prostatic hypertrophy)   . DVT (deep venous thrombosis) ([place]) 1998   after left knee arthroscopy  . Hx of colonoscopy 2000  . Hypertension   . Hypothyroid 2007   postop from thyroidectomy (cancer scare)  . Kidney stones    recurrent  . Oral cancer ([place]) 2004   graft from left thigh  . Osteoarthritis of both knees   . Parkinson's disease ([place])   . Pulmonary embolism ([place])   . Tendonitis 2012   from quinolone  . Toe amputation status 2010    Patient Active Problem List   Diagnosis Date Noted  . Diarrhea 12/13/2018  . Colitis 11/11/2018  . Pressure injury of skin 11/11/2018  . Dysphagia 10/02/2018  . Heat exhaustion 09/14/2018  . Abdominal aortic aneurysm (AAA) ([place])   . RBD (REM behavioral disorder) 10/24/2017  . Advance directive discussed with patient 11/01/2016  . Renal cyst, right 06/06/2016  . Acute deep vein thrombosis  (DVT) of popliteal vein of right lower extremity ([place]) 04/28/2016  . CKD (chronic kidney disease) stage 3, GFR 30-59 ml/min 03/05/2016  . Pulmonary embolism ([place]) 03/03/2016  . Vitamin B12 deficiency 09/09/2014  . Preventative health care 08/04/2014  . Parkinson's disease ([place]) 08/04/2014  . Esophageal diverticulum 01/01/2014  . Actinic keratosis 07/31/2013  . Hypertension   . Osteoarthritis of both knees   . Toe amputation status   . Benign prostatic hyperplasia   . Kidney stones   . Hypothyroid   . Tendonitis     Past Surgical History:  Procedure Laterality Date  . APPENDECTOMY    . CHOLECYSTECTOMY    . CYSTOSCOPY/URETEROSCOPY/HOLMIUM LASER/STENT PLACEMENT Right 09/13/2017   Procedure: CYSTOSCOPY/URETEROSCOPY/HOLMIUM LASER/STENT PLACEMENT;  Surgeon: Hollice Espy, MD;  Location: [facility] ORS;  Service: Urology;  Laterality: Right;  . HERNIA REPAIR    . INTRAMEDULLARY (IM) NAIL INTERTROCHANTERIC Right 06/13/2017   Procedure: INTRAMEDULLARY (IM) NAIL INTERTROCHANTRIC;  Surgeon: Hessie Knows, MD;  Location: [facility] ORS;  Service: Orthopedics;  Laterality: Right;  . LITHOTRIPSY  2009/2010   then stent and cystoscopic removal 2014  . Oral cancer removed Right 2004  . REPAIR ZENKER'S DIVERTICULA  2013  . THYROIDECTOMY  2007  . TOE AMPUTATION Right 2010   badly overlapping other toe  . TOTAL KNEE ARTHROPLASTY Left   . TRANSURETHRAL RESECTION OF PROSTATE  2011  . UMBILICAL HERNIA REPAIR  2007  Prior to Admission medications   Medication Sig Start Date End Date Taking? Authorizing Provider  acetaminophen (TYLENOL) 325 MG tablet Take 2 tablets (650 mg total) by mouth every 6 (six) hours as needed for mild pain or moderate pain (or Fever >/= 101). 06/16/17   Dustin Flock, MD  amLODipine (NORVASC) 5 MG tablet TAKE 1 TABLET EVERY DAY 10/03/18   Venia Carbon, MD  apixaban (ELIQUIS) 2.5 MG TABS tablet Take 1 tablet (2.5 mg total) by mouth 2 (two) times daily. 07/26/18   Sindy Guadeloupe,  MD  carbidopa-levodopa (SINEMET IR) 25-100 MG tablet TAKE 1 TABLET THREE TIMES DAILY Patient taking differently: Take 1 tablet by mouth 4 (four) times daily.  10/03/18   Venia Carbon, MD  Cholecalciferol (VITAMIN D) 2000 UNITS CAPS Take 2,000 Units by mouth daily.     [provider]  levothyroxine (SYNTHROID) 112 MCG tablet TAKE 1 TABLET EVERY DAY BEFORE BREAKFAST 10/03/18   Venia Carbon, MD  vancomycin (VANCOCIN) 50 mg/mL oral solution Take 2.5 mLs (125 mg total) by mouth every 6 (six) hours. 12/15/18   Tower, Wynelle Fanny, MD  vitamin B-12 (CYANOCOBALAMIN) 1000 MCG tablet Take 1,000 mcg by mouth daily.    [provider]    Allergies Quinolones  Family History  Problem Relation Age of Onset  . Dementia Mother   . Stroke Father   . Pulmonary disease Brother   . Heart disease Neg Hx   . Diabetes Neg Hx     Social History Social History   Tobacco Use  . Smoking status: Former Smoker    Types: Cigars    Quit date: 04/27/2002    Years since quitting: 16.6  . Smokeless tobacco: Never Used  Substance Use Topics  . Alcohol use: Not Currently    Alcohol/week: 0.0 standard drinks  . Drug use: No      Review of Systems Constitutional: No fever/chills Eyes: No visual changes. ENT: No sore throat. Cardiovascular: Denies chest pain. Respiratory: Denies shortness of breath. Gastrointestinal: No abdominal pain.  No nausea, no vomiting.  No diarrhea.  No constipation.  Not eating Genitourinary: Negative for dysuria. Musculoskeletal: Negative for back pain. Skin: Negative for rash. Neurological: Negative for headaches, focal weakness or numbness.  Generalized weakness All other ROS negative ____________________________________________   PHYSICAL EXAM:  VITAL SIGNS: ED Triage Vitals  Enc Vitals Group     BP 12/19/18 1658 (!) 157/89     Pulse Rate 12/19/18 1658 74     Resp 12/19/18 1658 13     Temp --      Temp src --      SpO2 12/19/18 1658 94 %      Weight --      Height --      Head Circumference --      Peak Flow --      Pain Score 12/19/18 1659 0     Pain Loc --      Pain Edu? --      Excl. in [place]? --     Constitutional: Alert and oriented. Well appearing but elderly appearing. Eyes: Conjunctivae are normal. EOMI. Head: Atraumatic. Nose: No congestion/rhinnorhea. Mouth/Throat: Mucous membranes are dry Neck: No stridor. Trachea Midline. FROM Cardiovascular: Normal rate, regular rhythm. Grossly normal heart sounds.  Good peripheral circulation. Respiratory: Normal respiratory effort.  No retractions. Lungs CTAB. Gastrointestinal: Soft and nontender. No distention. No abdominal bruits.  Musculoskeletal: No lower extremity tenderness nor edema.  No joint effusions.  Neurologic:  Normal speech and language. No gross focal neurologic deficits are appreciated.  Good strength in his arms and legs Skin:  Skin is warm, dry and intact. No rash noted. Psychiatric: Mood and affect are normal. Speech and behavior are normal. GU: Deferred   ____________________________________________   LABS (all labs ordered are listed, but only abnormal results are displayed)  Labs Reviewed  COMPREHENSIVE METABOLIC PANEL - Abnormal; Notable for the following components:      Result Value   Creatinine, Ser 1.49 (*)    Total Bilirubin 1.3 (*)    GFR calc non Af Amer 42 (*)    GFR calc Af Amer 49 (*)    All other components within normal limits  URINALYSIS, ROUTINE W REFLEX MICROSCOPIC - Abnormal; Notable for the following components:   Color, Urine YELLOW (*)    APPearance CLEAR (*)    Hgb urine dipstick SMALL (*)    Ketones, ur 5 (*)    All other components within normal limits  TROPONIN I (HIGH SENSITIVITY) - Abnormal; Notable for the following components:   Troponin I (High Sensitivity) 21 (*)    All other components within normal limits  CBC WITH DIFFERENTIAL/PLATELET  MAGNESIUM  TROPONIN I (HIGH SENSITIVITY)    ____________________________________________   ED ECG REPORT I, Vanessa Blythe, the attending physician, personally viewed and interpreted this ECG.  EKG is reported branch block, no ST elevation, wandering baseline, does have a T wave inversion in V3, normal intervals.  EKG looks very similar to prior. ____________________________________________  RADIOLOGY Robert Bellow, personally viewed and evaluated these images (plain radiographs) as part of my medical decision making, as well as reviewing the written report by the radiologist.  ED MD interpretation: No pneumonia  Official radiology report(s): Dg Chest Portable 1 View  Result Date: 12/19/2018 CLINICAL DATA:  Weakness EXAM: PORTABLE CHEST 1 VIEW COMPARISON:  11/10/2018 FINDINGS: Minimal scarring at the left base. No acute consolidation or effusion. Stable cardiomediastinal silhouette with aortic atherosclerosis. No pneumothorax. IMPRESSION: No active disease.  Scarring at the left lung base. Electronically Signed   By: Donavan Foil M.D.   On: 12/19/2018 17:42    ____________________________________________   PROCEDURES  Procedure(s) performed (including Critical Care):  Procedures   ____________________________________________   INITIAL IMPRESSION / ASSESSMENT AND PLAN / ED COURSE  Shadrack Carabello was evaluated in Emergency Department on 12/19/2018 for the symptoms described in the history of present illness. He was evaluated in the context of the global COVID-19 pandemic, which necessitated consideration that the patient might be at risk for infection with the SARS-CoV-2 virus that causes COVID-19. Institutional protocols and algorithms that pertain to the evaluation of patients at risk for COVID-19 are in a state of rapid change based on information released by regulatory bodies including the CDC and federal and state organizations. These policies and algorithms were followed during the patient's care in the ED.    Patient  is an 85 year old who presents with not eating and generalized weakness.  Patient does appear dry on exam.  We will give some fluids.  Will check labs evaluate for AKI, electrolyte abnormalities.  Will get urine evaluate for UTI.  Will get chest x-ray to evaluate for pneumonia.  No abdominal tenderness to suggest toxic megacolon.  Patient is currently getting treatment for C. Difficile.  Will get cardiac markers to evaluate for ACS of the lower suspicion given no chest pain or shortness of breath.  Kidney function slightly elevated at 1.49.  Patient getting 1.5 L of fluid.  Plan came back at 21 which I suspect is secondary to his AKI.  Patient has no chest pain.  I had a lengthy discussion with patient's son.  They are not interested in having patient go to rehab facility again.  They would prefer to take him back to the place he is living currently.  They are working on getting hospice, palliative and additional resources at home.  We discussed the elevated troponin but they did not want to get repeat given he is not having any chest pain and the fact that they probably would not want any invasive interventions anyways.  They said that recently with his Parkinson's he has not been able to swallow and that is causing him too weak to be weakness from the dehydration.  They do not want to have a G-tube placed.  At this time they feel satisfied with their care and given the fluids.  Patient is much more perkier after the fluids alert and oriented x3 and says that he feels ready to go home.    ____________________________________________   FINAL CLINICAL IMPRESSION(S) / ED DIAGNOSES   Final diagnoses:  Dehydration  AKI (acute kidney injury) ([place])       MEDICATIONS GIVEN DURING THIS VISIT:  Medications  sodium chloride 0.9 % bolus 1,000 mL (0 mLs Intravenous Stopped 12/19/18 2007)  sodium chloride 0.9 % bolus 500 mL (500 mLs Intravenous New Bag/Given 12/19/18 2007)     ED Discharge Orders     None       Note:  This document was prepared using Dragon voice recognition software and may include unintentional dictation errors.   Vanessa Casselberry, MD 12/19/18 2110

## 2018-12-19 NOTE — ED Triage Notes (Signed)
PT to ED via EMS from [place]. PT called out d/t weakness and decreased appetite and lethargy. PT was recently dx with C. Diff, still taking abx for that. Denies any belly pain, AO.

## 2018-12-19 NOTE — Discharge Instructions (Signed)
Patient had a slightly elevated kidney function at 1.49.  We gave some fluid and patient responded very well.  At this time will discharge patient back home and continue to work on getting additional care at home.  Return to the ER for falls, worsening weakness or any other concerns.

## 2018-12-20 ENCOUNTER — Telehealth: Payer: Self-pay | Admitting: Internal Medicine

## 2018-12-20 NOTE — Telephone Encounter (Signed)
Langley Gauss with [facility] calling to request verbal orders for Palliative Care Pt is living at [facility] and they faxed over requests for Palliative Care services - Per Langley Gauss she needs either verbal orders stating okay to start Palliative Care services or can sign and fax back the order form which should have been faxed to Dr Silvio Pate   Please fax back to [phone] OR call to give VO at [phone]

## 2018-12-20 NOTE — Telephone Encounter (Signed)
We have the fax and are holding on to it until after his OV tomorrow to decide what is the proper referral for him.

## 2018-12-20 NOTE — Telephone Encounter (Signed)
Spoke to [place] to tell her our plan.

## 2018-12-21 ENCOUNTER — Ambulatory Visit (INDEPENDENT_AMBULATORY_CARE_PROVIDER_SITE_OTHER): Payer: Medicare HMO | Admitting: Internal Medicine

## 2018-12-21 ENCOUNTER — Other Ambulatory Visit: Payer: Self-pay

## 2018-12-21 ENCOUNTER — Encounter: Payer: Self-pay | Admitting: Internal Medicine

## 2018-12-21 ENCOUNTER — Telehealth: Payer: Self-pay | Admitting: Adult Health Nurse Practitioner

## 2018-12-21 VITALS — BP 100/60 | HR 80 | Temp 98.1°F | Ht 70.0 in | Wt 152.2 lb

## 2018-12-21 DIAGNOSIS — Z89421 Acquired absence of other right toe(s): Secondary | ICD-10-CM

## 2018-12-21 DIAGNOSIS — D689 Coagulation defect, unspecified: Secondary | ICD-10-CM | POA: Diagnosis not present

## 2018-12-21 DIAGNOSIS — E441 Mild protein-calorie malnutrition: Secondary | ICD-10-CM

## 2018-12-21 DIAGNOSIS — G20 Parkinson's disease: Secondary | ICD-10-CM | POA: Diagnosis not present

## 2018-12-21 DIAGNOSIS — G20A1 Parkinson's disease without dyskinesia, without mention of fluctuations: Secondary | ICD-10-CM

## 2018-12-21 DIAGNOSIS — I2692 Saddle embolus of pulmonary artery without acute cor pulmonale: Secondary | ICD-10-CM | POA: Diagnosis not present

## 2018-12-21 NOTE — Assessment & Plan Note (Signed)
Likely the major issue with the dysphagia (and overall functional decline)

## 2018-12-21 NOTE — Telephone Encounter (Signed)
Spoke with patient's wife [name] regarding Palliative services and she was in agreement with this.  I have scheduled a Telephone Consult for 01/01/19 @ 10:30 AM

## 2018-12-21 NOTE — Assessment & Plan Note (Signed)
Multifactorial dysphagia Discussed palliative consult and should consider hospice

## 2018-12-21 NOTE — Assessment & Plan Note (Signed)
Stable No infection or problems with this

## 2018-12-21 NOTE — Progress Notes (Signed)
Virtual Visit via Video Note The purpose of this virtual visit is to provide medical care while limiting exposure to the novel coronavirus.  In addition, pt currently with c. Diff infection and trying to limit exposure/spread.  Consent was obtained for video visit:  Yes.   Answered questions that patient had about telehealth interaction:  Yes.   I discussed the limitations, risks, security and privacy concerns of performing an evaluation and management service by telemedicine. I also discussed with the patient that there may be a patient responsible charge related to this service. The patient expressed understanding and agreed to proceed.  Pt location: Home Physician Location: office I connected with Hilda Lias at patients initiation/request on 12/24/2018 at  2:00 PM EDT by video enabled telemedicine application and verified that I am speaking with the correct person using two identifiers. Pt MRN:  [id number] Pt DOB:  11/14/1931 Video Participants:  Hilda Lias;  Son Sherren Mocha; wife Leorn Faist was seen today in follow up for Parkinsons disease.   Pt denies falls.  Pt denies lightheadedness, near syncope.  No hallucinations.  Mood has been good.  Medical records are reviewed since our last visit.  They called me on November 29, 2018 complaining of trouble swallowing.  I requested a modified barium swallow, but the wife declined that.  Fortunately, they followed up with her primary care physician and he did order a modified barium swallow.  This was done on December 10, 2018.  From a neurologic standpoint, it was actually fairly unremarkable.  As previous, the most notable issue was the presence of a chronic Zenker's diverticulum which "filled with material quickly during the study and did not clear despite multiple swallows or use of liquid wash."  The speech pathologist recommended that the patient stay on his current diet, which resembled a mechanical soft diet.  Esophageal assessment was to be  considered.  Patient was seen by [facility] gastroenterology the day after that study.  Gastroenterology agreed that the dysphagia was likely contributed by the Zenker's diverticula.  They did talk to him about the real possibility of PEG placement should he continue to lose weight.  Patient was in the emergency room on December 19, 2018 for weakness, decreased appetite and failure to thrive, in the face of C. difficile infection.  He was shown to be dehydrated.   He had an elevated troponin.  Regardless, his family did not want him to stay and did not want rehab options.  They were discharged from the emergency room.  Pt states that he doing better now because he is eating and is getting liquids into him.  Daughter found some good dysphagia meals that are frozen and pallatible.   Wife reports fell OOB on 9/11 while sleeping and had to go to the ER for stitches.  Went to rehab at [facility] until 9/30.  Pt now considering palliative care and has an interview at 4pm.    Current movement disorder medications: carbidopa/levodopa 25/100, 1 tablet at 6 AM/10 AM/2 PM/6 PM (wife states that they are actually taking them at 7am/noon/5pm/10pm)   PREVIOUS MEDICATIONS:  clonazepam (hangover effect) even with 0.25 mg.  ALLERGIES:   Allergies  Allergen Reactions   Quinolones     tendonitis    CURRENT MEDICATIONS:  Outpatient Encounter Medications as of 12/24/2018  Medication Sig   acetaminophen (TYLENOL) 325 MG tablet Take 2 tablets (650 mg total) by mouth every 6 (six) hours as needed for mild pain or moderate pain (or  Fever >/= 101).   amLODipine (NORVASC) 5 MG tablet TAKE 1 TABLET EVERY DAY (Patient not taking: Reported on 12/21/2018)   apixaban (ELIQUIS) 2.5 MG TABS tablet Take 1 tablet (2.5 mg total) by mouth 2 (two) times daily.   carbidopa-levodopa (SINEMET IR) 25-100 MG tablet TAKE 1 TABLET THREE TIMES DAILY (Patient taking differently: Take 1 tablet by mouth 4 (four) times daily. )    Cholecalciferol (VITAMIN D) 2000 UNITS CAPS Take 2,000 Units by mouth daily.    levothyroxine (SYNTHROID) 112 MCG tablet TAKE 1 TABLET EVERY DAY BEFORE BREAKFAST   ramelteon (ROZEREM) 8 MG tablet Take 1 tablet (8 mg total) by mouth at bedtime.   vancomycin (VANCOCIN) 125 MG capsule Take 125 mg by mouth 4 (four) times daily.   vancomycin (VANCOCIN) 50 mg/mL oral solution Take 2.5 mLs (125 mg total) by mouth every 6 (six) hours.   vitamin B-12 (CYANOCOBALAMIN) 1000 MCG tablet Take 1,000 mcg by mouth daily.   No facility-administered encounter medications on file as of 12/24/2018.     PAST MEDICAL HISTORY:   Past Medical History:  Diagnosis Date   Abdominal aortic aneurysm (AAA) ([place])    ARF (acute renal failure) ([place]) 2011   after TKR   BPH (benign prostatic hypertrophy)    C. difficile diarrhea    DVT (deep venous thrombosis) ([place]) 1998   after left knee arthroscopy   Hx of colonoscopy 2000   Hypertension    Hypothyroid 2007   postop from thyroidectomy (cancer scare)   Kidney stones    recurrent   Oral cancer ([place]) 2004   graft from left thigh   Osteoarthritis of both knees    Parkinson's disease ([place])    Pulmonary embolism ([place])    Tendonitis 2012   from quinolone   Toe amputation status 2010    PAST SURGICAL HISTORY:   Past Surgical History:  Procedure Laterality Date   APPENDECTOMY     CHOLECYSTECTOMY     CYSTOSCOPY/URETEROSCOPY/HOLMIUM LASER/STENT PLACEMENT Right 09/13/2017   Procedure: CYSTOSCOPY/URETEROSCOPY/HOLMIUM LASER/STENT PLACEMENT;  Surgeon: Hollice Espy, MD;  Location: [facility] ORS;  Service: Urology;  Laterality: Right;   HERNIA REPAIR     INTRAMEDULLARY (IM) NAIL INTERTROCHANTERIC Right 06/13/2017   Procedure: INTRAMEDULLARY (IM) NAIL INTERTROCHANTRIC;  Surgeon: Hessie Knows, MD;  Location: [facility] ORS;  Service: Orthopedics;  Laterality: Right;   LITHOTRIPSY  2009/2010   then stent and cystoscopic removal 2014   Oral cancer removed  Right 2004   REPAIR ZENKER'S DIVERTICULA  2013   THYROIDECTOMY  2007   TOE AMPUTATION Right 2010   badly overlapping other toe   TOTAL KNEE ARTHROPLASTY Left    TRANSURETHRAL RESECTION OF PROSTATE  [id number]   UMBILICAL HERNIA REPAIR  2007    SOCIAL HISTORY:   Social History   Socioeconomic History   Marital status: Married    Spouse name: Not on file   Number of children: 3   Years of education: Not on file   Highest education level: Not on file  Occupational History   Occupation: Merchandiser, 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  Scientist, product/process development strain: Not on file   Food insecurity    Worry: Not on file    Inability: Not on Lexicographer needs    Medical: Not on file    Non-medical: Not on file  Tobacco Use   Smoking status: Former Smoker    Types: Cigars    Quit date:  04/27/2002    Years since quitting: 16.6   Smokeless tobacco: Never Used  Substance and Sexual Activity   Alcohol use: Not Currently    Alcohol/week: 0.0 standard drinks   Drug use: No   Sexual activity: Not Currently  Lifestyle   Physical activity    Days per week: Not on file    Minutes per session: Not on file   Stress: Not on file  Relationships   Social connections    Talks on phone: Not on file    Gets together: Not on file    Attends religious service: Not on file    Active member of club or organization: Not on file    Attends meetings of clubs or organizations: Not on file    Relationship status: Not on file   Intimate partner violence    Fear of current or ex partner: Not on file    Emotionally abused: Not on file    Physically abused: Not on file    Forced sexual activity: Not on file  Other Topics Concern   Not on file  Social History Narrative   Son works for [employer]   1 daughter in [place], other in [place]      Has living will   Wife, then son, would be health care POA   Has DNR   No  tube feeds if cognitively unaware   Has MOST form done 12/21/18    FAMILY HISTORY:   Family Status  Relation Name Status   Mother  Deceased at age 47       old age   Father  Deceased at age 67       stroke   Brother  Deceased       unknown-- had moved away   Child 3,healthy Alive   Neg Hx  (Not Specified)    ROS:  Review of Systems  Constitutional: Positive for malaise/fatigue and weight loss.  HENT: Negative.   Eyes: Negative.   Respiratory: Negative.   Cardiovascular: Negative.   Gastrointestinal: Negative.   Genitourinary: Negative.   Skin: Negative.     PHYSICAL EXAMINATION:    VITALS:   Vitals:   12/24/18 1124  Weight: 145 lb (65.8 kg)  Height: 5\' 10"  (1.778 m)    GEN:  The patient appears stated age and is in NAD. HEENT:  Normocephalic, atraumatic.    Neurological examination:  Orientation: The patient is alert and oriented x3. Cranial nerves: There is good facial symmetry with facial hypomimia. The speech is fluent and clear.  Motor: Strength is at least antigravity x4.  Movement examination: Abnormal movements: none seen Coordination:  There is no decremation with RAM's, with hand opening and closing her finger taps bilaterally Gait and Station: The patients wife stated that she did not feel she could get him out of the wheelchair right now to have him ambulate.  His son did state that he was able to walk with his walker over the weekend and was able to get himself in and out of the car.   ASSESSMENT/PLAN:  1.  Idiopathic Parkinson's disease             -We discussed the diagnosis as well as pathophysiology of the disease.  We discussed treatment options as well as prognostic indicators.  Patient education was provided.             -Meds continue to be spread too far apart.  I told the patient to take carbidopa/levodopa 25/100, 1  tab at 8 AM/11 AM/2 PM/6 PM.  Risks, benefits, side effects and alternative therapies were discussed.  The opportunity  to ask questions was given and they were answered to the best of my ability.  The patient expressed understanding and willingness to follow the outlined treatment protocols.             -has DNR and [place] MOST has been filled out.  End-of-life issues discussed.  Has a palliative care appointment later today.  -Physical therapy is currently come to the house.  2.  Dysphagia             -he has a zenkers diverticulum.  He had a MBE on December 10, 2018.  From a primary neurologic standpoint, it actually looked fairly well.  The biggest issue was the Zenker's diverticula, which filled quickly with material.  He is following with gastroenterology, but they felt that there was nothing more that they could do.  He is already modifying his diet to a mechanical soft diet.  I do not think that this is neurologic or from his Parkinson's disease and I think that the modified barium swallow supports this.  We discussed this in detail today. 3.  B12 deficiency             -continue this faithfully. His B12 was 192 was prior to starting supplement.  4.  REM behavior disorder             -had a fall out of the bed despite the bedrail.  He was unable to tolerate low-dose clonazepam.  I am worried that he could get quite hurt with the kind of falls that he is having.  We decided to try Rozerem, as there is some data that this could help with REM behavior disorder.  We did discuss that this could make him very sleepy in the middle of the night, so he needs to be watched when he gets up to use the bathroom.  We will try 8 mg, and discussed risk, benefits, and side effects.           5.  Follow up is anticipated in the next 4-6 months, sooner should new neurologic issues arise.  Much greater than 50% of this visit was spent in counseling and coordinating care.  Total face to face time:  30 min   Cc:  Venia Carbon, MD

## 2018-12-21 NOTE — Assessment & Plan Note (Signed)
Continues on the eliquis High fall risk

## 2018-12-21 NOTE — Progress Notes (Signed)
Subjective:    Patient ID: Walter [name], male    DOB: 12/25/1931, 85 y.o.   MRN: [id number]  HPI Here with wife due to ongoing decline and to discuss options  Had the swallow study Saw Dr Richardson Landry -- agrees the problems are not related to the small residual diverticulum Saw GI as well Will have virtual visit with Dr Tat next week  Diarrhea did recur Hard getting him to tolerate the vancomycin--has to be ground up, etc Now this has mostly resolved---just 1 formed stool in AM now  Has a hard time swallowing boost or ensure It is "heavy" for him Still may throw that up Has good days and bad days---some days he can't even swallow water  Wife now has an easier time caring for him ---emotionally Has aide every weekday morning Uses aides if she needs to do errands also Wife has help with cleaning as well, etc  Continues on the eliquis due to the PE Had DVT also No recent CP or SOB  Current Outpatient Medications on File Prior to Visit  Medication Sig Dispense Refill  . acetaminophen (TYLENOL) 325 MG tablet Take 2 tablets (650 mg total) by mouth every 6 (six) hours as needed for mild pain or moderate pain (or Fever >/= 101).    Marland Kitchen apixaban (ELIQUIS) 2.5 MG TABS tablet Take 1 tablet (2.5 mg total) by mouth 2 (two) times daily. 180 tablet 3  . carbidopa-levodopa (SINEMET IR) 25-100 MG tablet TAKE 1 TABLET THREE TIMES DAILY (Patient taking differently: Take 1 tablet by mouth 4 (four) times daily. ) 270 tablet 3  . Cholecalciferol (VITAMIN D) 2000 UNITS CAPS Take 2,000 Units by mouth daily.     Marland Kitchen levothyroxine (SYNTHROID) 112 MCG tablet TAKE 1 TABLET EVERY DAY BEFORE BREAKFAST 90 tablet 3  . vancomycin (VANCOCIN) 50 mg/mL oral solution Take 2.5 mLs (125 mg total) by mouth every 6 (six) hours. 140 mL 0  . vitamin B-12 (CYANOCOBALAMIN) 1000 MCG tablet Take 1,000 mcg by mouth daily.    Marland Kitchen amLODipine (NORVASC) 5 MG tablet TAKE 1 TABLET EVERY DAY (Patient not taking: Reported on 12/21/2018) 90  tablet 3   No current facility-administered medications on file prior to visit.     Allergies  Allergen Reactions  . Quinolones     tendonitis    Past Medical History:  Diagnosis Date  . Abdominal aortic aneurysm (AAA) ([place])   . ARF (acute renal failure) ([place]) 2011   after TKR  . BPH (benign prostatic hypertrophy)   . C. difficile diarrhea   . DVT (deep venous thrombosis) ([place]) 1998   after left knee arthroscopy  . Hx of colonoscopy 2000  . Hypertension   . Hypothyroid 2007   postop from thyroidectomy (cancer scare)  . Kidney stones    recurrent  . Oral cancer ([place]) 2004   graft from left thigh  . Osteoarthritis of both knees   . Parkinson's disease ([place])   . Pulmonary embolism ([place])   . Tendonitis 2012   from quinolone  . Toe amputation status 2010    Past Surgical History:  Procedure Laterality Date  . APPENDECTOMY    . CHOLECYSTECTOMY    . CYSTOSCOPY/URETEROSCOPY/HOLMIUM LASER/STENT PLACEMENT Right 09/13/2017   Procedure: CYSTOSCOPY/URETEROSCOPY/HOLMIUM LASER/STENT PLACEMENT;  Surgeon: Hollice Espy, MD;  Location: [facility] ORS;  Service: Urology;  Laterality: Right;  . HERNIA REPAIR    . INTRAMEDULLARY (IM) NAIL INTERTROCHANTERIC Right 06/13/2017   Procedure: INTRAMEDULLARY (IM) NAIL INTERTROCHANTRIC;  Surgeon: Hessie Knows,  MD;  Location: [facility] ORS;  Service: Orthopedics;  Laterality: Right;  . LITHOTRIPSY  2009/2010   then stent and cystoscopic removal 2014  . Oral cancer removed Right 2004  . REPAIR ZENKER'S DIVERTICULA  2013  . THYROIDECTOMY  2007  . TOE AMPUTATION Right 2010   badly overlapping other toe  . TOTAL KNEE ARTHROPLASTY Left   . TRANSURETHRAL RESECTION OF PROSTATE  2011  . UMBILICAL HERNIA REPAIR  2007    Family History  Problem Relation Age of Onset  . Dementia Mother   . Stroke Father   . Pulmonary disease Brother   . Heart disease Neg Hx   . Diabetes Neg Hx     Social History   Socioeconomic History  . Marital status: Married     Spouse name: Not on file  . Number of children: 3  . Years of education: Not on file  . Highest education level: Not on file  Occupational History  . 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 Financial resource strain: Not on file  . Food insecurity    Worry: Not on file    Inability: Not on file  . Transportation needs    Medical: Not on file    Non-medical: Not on file  Tobacco Use  . Smoking status: Former Smoker    Types: Cigars    Quit date: 04/27/2002    Years since quitting: 16.6  . Smokeless tobacco: Never Used  Substance and Sexual Activity  . Alcohol use: Not Currently    Alcohol/week: 0.0 standard drinks  . Drug use: No  . Sexual activity: Not Currently  Lifestyle  . Physical activity    Days per week: Not on file    Minutes per session: Not on file  . Stress: Not on file  Relationships  . Social Herbalist on phone: Not on file    Gets together: Not on file    Attends religious service: Not on file    Active member of club or organization: Not on file    Attends meetings of clubs or organizations: Not on file    Relationship status: Not on file  . Intimate partner violence    Fear of current or ex partner: Not on file    Emotionally abused: Not on file    Physically abused: Not on file    Forced sexual activity: Not on file  Other Topics Concern  . Not on file  Social History Narrative   Son works for [employer]   1 daughter in [place], other in [place] living will   Wife, then son, would be health care [place]   Has DNR   No tube feeds if cognitively unaware   Has MOST form done 12/21/18   Review of Systems Weight is down 20# Sleeps fine No problems with feet now     Objective:   Physical Exam  Constitutional:  Clear wasting  Psychiatric:  Some psychomotor retardation--but could be related to his Parkinson's  Doesn't seem to be depressed            Assessment & Plan:

## 2018-12-21 NOTE — Assessment & Plan Note (Signed)
High risk of recurrence due to mobility deficits--so continuing this

## 2018-12-24 ENCOUNTER — Other Ambulatory Visit: Payer: Self-pay | Admitting: Neurology

## 2018-12-24 ENCOUNTER — Encounter: Payer: Self-pay | Admitting: Neurology

## 2018-12-24 ENCOUNTER — Other Ambulatory Visit: Payer: Self-pay

## 2018-12-24 ENCOUNTER — Other Ambulatory Visit: Payer: Medicare HMO | Admitting: Adult Health Nurse Practitioner

## 2018-12-24 ENCOUNTER — Ambulatory Visit (INDEPENDENT_AMBULATORY_CARE_PROVIDER_SITE_OTHER): Payer: Medicare HMO | Admitting: Neurology

## 2018-12-24 VITALS — Ht 70.0 in | Wt 145.0 lb

## 2018-12-24 DIAGNOSIS — G4752 REM sleep behavior disorder: Secondary | ICD-10-CM | POA: Diagnosis not present

## 2018-12-24 DIAGNOSIS — G20 Parkinson's disease: Secondary | ICD-10-CM

## 2018-12-24 DIAGNOSIS — Z515 Encounter for palliative care: Secondary | ICD-10-CM

## 2018-12-24 DIAGNOSIS — R1312 Dysphagia, oropharyngeal phase: Secondary | ICD-10-CM

## 2018-12-24 MED ORDER — RAMELTEON 8 MG PO TABS: 8.0000 mg | ORAL_TABLET | Freq: Every day | ORAL | 5 refills | Status: DC

## 2018-12-24 NOTE — Progress Notes (Signed)
[facility] Consult Note Telephone: [phone]  Fax: [phone]  PATIENT NAME: Walter [name] DOB: Apr 20, 1931 MRN: [id number]  PRIMARY CARE PROVIDER:   Venia Carbon, MD  REFERRING PROVIDER:  Venia Carbon, MD [place],  [place] [address]  RESPONSIBLE PARTY:   Wife, Drayven Grill [phone]  Due to the COVID-19 crisis, this visit was done via telemedicine and it was initiated and consent by this patient and or family.   Zoom visit is facilitated by SW at [facility], [place].  Son and daughter also joined meeting via [place] and PLAN:  1.  Advanced care planning.  Patient is a DNR.  States having a MOST with limited interventions and no feeding tube.  Goals of care are to stay at home with help as long as possible. Interested in hospice once appropriate  2.  Parkinson's disease.  Patient is able to get around with a walker but also uses a wheelchair.  Denies tremors today.  Recently had appointment with neurology who changed the timing of his Sinemet which he gets QID.  Wife states that he does get "woozy" sometimes during the day.  May be possible low BP due to Sinemet.  Instructed to check BP when feeling light headed and to take Sinemet with water or pedialyte. Patient's BP meds have been stopped due to having low BP.  Wife states that it has been better since the BP meds have been stopped and his BP has been around 120/60. Patient is currently receiving home health PT and his goal is to be able to walk with his walker to the mailbox and back.  Right now is feeling weak and is unable to do this but encouraged to keep working towards his goal but not to get discouraged if he has a new baseline and cannot completely reach that goal.  Continue follow up and recommendations by neurology  3. Nutrition. Patient has periods where he has dysphagia related to his Parkinson's and also has a history of Zancker's  diverticulum in which he has had esophageal surgery in the past.  Was in the ED on 12/19/2018 for dehydration after have periods of dysphagia off and on. Patient has lost about 20 pounds over the past couple of months. Daughter was able to find online specialty foods for Parkinson's patients called [employer].  The meals are pureed and ready for patient to heat up and eat.  He just started receiving them but states that they are good and has tolerated them well.  Does supplement with Ensure.  Encouraged to continue trying the new meals and Ensure supplementation and to monitor weight.    4.  Support.  Through [facility] patient is able to receive aides in the home at affordable rates for the times they need.  They have an aide that comes in for an hour every morning Monday through Friday.  Wife does state that an aide can come with her when she needs help taking her husband to doctor appointments and can stay with him in the home when she has errands to run.  Son lives in the [place] area and is able to come help when needed and daughter lives in [place] but is closely involved in her dad's care.  SW at facility helps them facilitate services they need.    Patient is doing well with therapy right now and is able to tolerate these new meals. Have  appointment in 2 weeks to monitor progress. Wife encouraged to call with any concerns.  I spent 60 minutes providing this consultation,  from 4:30 to 5:30. More than 50% of the time in this consultation was spent coordinating communication.   HISTORY OF PRESENT ILLNESS:  Walter [name] is a 85 y.o. year old male with multiple medical problems including Parkinson's disease, HTN, BPH, h/o oral cancer, hypothyroidism. Palliative Care was asked to help address goals of care. Patient has been treated for c.diff over the past 6 months.  Still has 7-10 days of vancomycin treatment left.  Denies any diarrhea, N/V, fever today.  States that stools have been formed.   CODE STATUS: DNR  PPS: 50% HOSPICE ELIGIBILITY/DIAGNOSIS: TBD  PHYSICAL EXAM:   Deferred   PAST MEDICAL HISTORY:  Past Medical History:  Diagnosis Date  . Abdominal aortic aneurysm (AAA) ([place])   . ARF (acute renal failure) ([place]) 2011   after TKR  . BPH (benign prostatic hypertrophy)   . C. difficile diarrhea   . DVT (deep venous thrombosis) ([place]) 1998   after left knee arthroscopy  . Hx of colonoscopy 2000  . Hypertension   . Hypothyroid 2007   postop from thyroidectomy (cancer scare)  . Kidney stones    recurrent  . Oral cancer ([place]) 2004   graft from left thigh  . Osteoarthritis of both knees   . Parkinson's disease ([place])   . Pulmonary embolism ([place])   . Tendonitis 2012   from quinolone  . Toe amputation status 2010    SOCIAL HX:  Social History   Tobacco Use  . Smoking status: Former Smoker    Types: Cigars    Quit date: 04/27/2002    Years since quitting: 16.6  . Smokeless tobacco: Never Used  Substance Use Topics  . Alcohol use: Not Currently    Alcohol/week: 0.0 standard drinks    ALLERGIES:  Allergies  Allergen Reactions  . Quinolones     tendonitis     PERTINENT MEDICATIONS:  Outpatient Encounter Medications as of 12/24/2018  Medication Sig  . acetaminophen (TYLENOL) 325 MG tablet Take 2 tablets (650 mg total) by mouth every 6 (six) hours as needed for mild pain or moderate pain (or Fever >/= 101).  Marland Kitchen amLODipine (NORVASC) 5 MG tablet TAKE 1 TABLET EVERY DAY (Patient not taking: Reported on 12/21/2018)  . apixaban (ELIQUIS) 2.5 MG TABS tablet Take 1 tablet (2.5 mg total) by mouth 2 (two) times daily.  . carbidopa-levodopa (SINEMET IR) 25-100 MG tablet TAKE 1 TABLET THREE TIMES DAILY (Patient taking differently: Take 1 tablet by mouth 4 (four) times daily. )  . Cholecalciferol (VITAMIN D) 2000 UNITS CAPS Take 2,000 Units by mouth daily.   Marland Kitchen levothyroxine (SYNTHROID) 112 MCG tablet TAKE 1 TABLET EVERY DAY BEFORE BREAKFAST  . ramelteon (ROZEREM) 8  MG tablet Take 1 tablet (8 mg total) by mouth at bedtime.  . vancomycin (VANCOCIN) 125 MG capsule Take 125 mg by mouth 4 (four) times daily.  . vancomycin (VANCOCIN) 50 mg/mL oral solution Take 2.5 mLs (125 mg total) by mouth every 6 (six) hours.  . vitamin B-12 (CYANOCOBALAMIN) 1000 MCG tablet Take 1,000 mcg by mouth daily.   No facility-administered encounter medications on file as of 12/24/2018.      Amy Jenetta Downer, NP

## 2018-12-25 DIAGNOSIS — I714 Abdominal aortic aneurysm, without rupture: Secondary | ICD-10-CM | POA: Diagnosis not present

## 2018-12-25 DIAGNOSIS — E538 Deficiency of other specified B group vitamins: Secondary | ICD-10-CM | POA: Diagnosis not present

## 2018-12-25 DIAGNOSIS — N183 Chronic kidney disease, stage 3 unspecified: Secondary | ICD-10-CM | POA: Diagnosis not present

## 2018-12-25 DIAGNOSIS — N400 Enlarged prostate without lower urinary tract symptoms: Secondary | ICD-10-CM | POA: Diagnosis not present

## 2018-12-25 DIAGNOSIS — I2782 Chronic pulmonary embolism: Secondary | ICD-10-CM | POA: Diagnosis not present

## 2018-12-25 DIAGNOSIS — E039 Hypothyroidism, unspecified: Secondary | ICD-10-CM | POA: Diagnosis not present

## 2018-12-25 DIAGNOSIS — M1711 Unilateral primary osteoarthritis, right knee: Secondary | ICD-10-CM | POA: Diagnosis not present

## 2018-12-25 DIAGNOSIS — G20 Parkinson's disease: Secondary | ICD-10-CM | POA: Diagnosis not present

## 2018-12-25 DIAGNOSIS — I129 Hypertensive chronic kidney disease with stage 1 through stage 4 chronic kidney disease, or unspecified chronic kidney disease: Secondary | ICD-10-CM | POA: Diagnosis not present

## 2018-12-27 DIAGNOSIS — M1711 Unilateral primary osteoarthritis, right knee: Secondary | ICD-10-CM | POA: Diagnosis not present

## 2018-12-27 DIAGNOSIS — E538 Deficiency of other specified B group vitamins: Secondary | ICD-10-CM | POA: Diagnosis not present

## 2018-12-27 DIAGNOSIS — I129 Hypertensive chronic kidney disease with stage 1 through stage 4 chronic kidney disease, or unspecified chronic kidney disease: Secondary | ICD-10-CM | POA: Diagnosis not present

## 2018-12-27 DIAGNOSIS — I2782 Chronic pulmonary embolism: Secondary | ICD-10-CM | POA: Diagnosis not present

## 2018-12-27 DIAGNOSIS — G20 Parkinson's disease: Secondary | ICD-10-CM | POA: Diagnosis not present

## 2018-12-27 DIAGNOSIS — E039 Hypothyroidism, unspecified: Secondary | ICD-10-CM | POA: Diagnosis not present

## 2018-12-27 DIAGNOSIS — N183 Chronic kidney disease, stage 3 unspecified: Secondary | ICD-10-CM | POA: Diagnosis not present

## 2018-12-27 DIAGNOSIS — I714 Abdominal aortic aneurysm, without rupture: Secondary | ICD-10-CM | POA: Diagnosis not present

## 2018-12-27 DIAGNOSIS — N400 Enlarged prostate without lower urinary tract symptoms: Secondary | ICD-10-CM | POA: Diagnosis not present

## 2018-12-31 DIAGNOSIS — I714 Abdominal aortic aneurysm, without rupture: Secondary | ICD-10-CM | POA: Diagnosis not present

## 2018-12-31 DIAGNOSIS — N400 Enlarged prostate without lower urinary tract symptoms: Secondary | ICD-10-CM | POA: Diagnosis not present

## 2018-12-31 DIAGNOSIS — I129 Hypertensive chronic kidney disease with stage 1 through stage 4 chronic kidney disease, or unspecified chronic kidney disease: Secondary | ICD-10-CM | POA: Diagnosis not present

## 2018-12-31 DIAGNOSIS — G20 Parkinson's disease: Secondary | ICD-10-CM | POA: Diagnosis not present

## 2018-12-31 DIAGNOSIS — N183 Chronic kidney disease, stage 3 unspecified: Secondary | ICD-10-CM | POA: Diagnosis not present

## 2018-12-31 DIAGNOSIS — E039 Hypothyroidism, unspecified: Secondary | ICD-10-CM | POA: Diagnosis not present

## 2018-12-31 DIAGNOSIS — I2782 Chronic pulmonary embolism: Secondary | ICD-10-CM | POA: Diagnosis not present

## 2018-12-31 DIAGNOSIS — E538 Deficiency of other specified B group vitamins: Secondary | ICD-10-CM | POA: Diagnosis not present

## 2018-12-31 DIAGNOSIS — M1711 Unilateral primary osteoarthritis, right knee: Secondary | ICD-10-CM | POA: Diagnosis not present

## 2019-01-01 ENCOUNTER — Other Ambulatory Visit: Payer: Medicare HMO | Admitting: Adult Health Nurse Practitioner

## 2019-01-01 DIAGNOSIS — Z89421 Acquired absence of other right toe(s): Secondary | ICD-10-CM

## 2019-01-01 DIAGNOSIS — I2782 Chronic pulmonary embolism: Secondary | ICD-10-CM | POA: Diagnosis not present

## 2019-01-01 DIAGNOSIS — Z86718 Personal history of other venous thrombosis and embolism: Secondary | ICD-10-CM

## 2019-01-01 DIAGNOSIS — N400 Enlarged prostate without lower urinary tract symptoms: Secondary | ICD-10-CM

## 2019-01-01 DIAGNOSIS — I714 Abdominal aortic aneurysm, without rupture: Secondary | ICD-10-CM

## 2019-01-01 DIAGNOSIS — Z9181 History of falling: Secondary | ICD-10-CM

## 2019-01-01 DIAGNOSIS — I129 Hypertensive chronic kidney disease with stage 1 through stage 4 chronic kidney disease, or unspecified chronic kidney disease: Secondary | ICD-10-CM | POA: Diagnosis not present

## 2019-01-01 DIAGNOSIS — Z87891 Personal history of nicotine dependence: Secondary | ICD-10-CM

## 2019-01-01 DIAGNOSIS — Z7901 Long term (current) use of anticoagulants: Secondary | ICD-10-CM

## 2019-01-01 DIAGNOSIS — N183 Chronic kidney disease, stage 3 unspecified: Secondary | ICD-10-CM | POA: Diagnosis not present

## 2019-01-01 DIAGNOSIS — Z85818 Personal history of malignant neoplasm of other sites of lip, oral cavity, and pharynx: Secondary | ICD-10-CM

## 2019-01-01 DIAGNOSIS — E538 Deficiency of other specified B group vitamins: Secondary | ICD-10-CM

## 2019-01-01 DIAGNOSIS — E039 Hypothyroidism, unspecified: Secondary | ICD-10-CM

## 2019-01-01 DIAGNOSIS — M1711 Unilateral primary osteoarthritis, right knee: Secondary | ICD-10-CM

## 2019-01-01 DIAGNOSIS — G20 Parkinson's disease: Secondary | ICD-10-CM | POA: Diagnosis not present

## 2019-01-09 DIAGNOSIS — M1711 Unilateral primary osteoarthritis, right knee: Secondary | ICD-10-CM | POA: Diagnosis not present

## 2019-01-09 DIAGNOSIS — E538 Deficiency of other specified B group vitamins: Secondary | ICD-10-CM | POA: Diagnosis not present

## 2019-01-09 DIAGNOSIS — I129 Hypertensive chronic kidney disease with stage 1 through stage 4 chronic kidney disease, or unspecified chronic kidney disease: Secondary | ICD-10-CM | POA: Diagnosis not present

## 2019-01-09 DIAGNOSIS — N400 Enlarged prostate without lower urinary tract symptoms: Secondary | ICD-10-CM | POA: Diagnosis not present

## 2019-01-09 DIAGNOSIS — E039 Hypothyroidism, unspecified: Secondary | ICD-10-CM | POA: Diagnosis not present

## 2019-01-09 DIAGNOSIS — G20 Parkinson's disease: Secondary | ICD-10-CM | POA: Diagnosis not present

## 2019-01-09 DIAGNOSIS — I2782 Chronic pulmonary embolism: Secondary | ICD-10-CM | POA: Diagnosis not present

## 2019-01-09 DIAGNOSIS — I714 Abdominal aortic aneurysm, without rupture: Secondary | ICD-10-CM | POA: Diagnosis not present

## 2019-01-09 DIAGNOSIS — N183 Chronic kidney disease, stage 3 unspecified: Secondary | ICD-10-CM | POA: Diagnosis not present

## 2019-01-10 ENCOUNTER — Other Ambulatory Visit: Payer: Self-pay

## 2019-01-10 ENCOUNTER — Other Ambulatory Visit: Payer: Medicare HMO | Admitting: Adult Health Nurse Practitioner

## 2019-01-10 DIAGNOSIS — Z515 Encounter for palliative care: Secondary | ICD-10-CM

## 2019-01-10 DIAGNOSIS — G20 Parkinson's disease: Secondary | ICD-10-CM | POA: Diagnosis not present

## 2019-01-10 NOTE — Progress Notes (Signed)
[facility] Consult Note Telephone: [phone]  Fax: [phone]  PATIENT NAME: Walter [name] DOB: [birth year] MRN: [id number]  PRIMARY CARE PROVIDER:   Venia Carbon, MD  REFERRING PROVIDER:  Venia Carbon, MD [place],  [place] [address]  RESPONSIBLE Wife, Oz Tavani [phone]:          RECOMMENDATIONS and PLAN:  1.  Advanced care planning.  Patient is a DNR  2.  Parkinson's disease.  Patient has been working with PT and states that he has been able to go to the mailbox and back using his walker. Patient is more careful about using his walker to prevent falls.  His movement is slow.  His speech is slowed at times but understandable.  A&O x3.  Patient recently had a virtual visit with Dr. Carles Collet with no changes made to his medications.  Continue follow up and recommendations by neurology.  3.  Nutrition.  Wife prepares meals that are pureed for patient. Daughter has also found a meal delivery for pureed foods called [employer].  He has been tolerating this well and is gaining weight.  Weight on 10/26 was 145 today weighs 157.  12 pound weight gain.  Continue current meal plan.    Patient has improved tremendously over the past couple of weeks.  The new meal plans he is able to tolerate and his appetite is good with weight gain.  Have appointment in January after the holidays and encouraged to call with any changes or concerns.  Palliative will continue to follow for symptom management and monitor for decline and make recommendations as needed.  I spent 60 minutes providing this consultation,  from 11:00 to 12:00. More than 50% of the time in this consultation was spent coordinating communication.   HISTORY OF PRESENT ILLNESS:  Walter [name] is a 85 y.o. year old male with multiple medical problems including Parkinson's disease, HTN, BPH, h/o oral cancer, hypothyroidism. Palliative Care was asked to help address goals of  care.   CODE STATUS: DNR  PPS: 50% HOSPICE ELIGIBILITY/DIAGNOSIS: TBD  PHYSICAL EXAM:  BP 124/62  HR 61  O2 sat 98% General: NAD, frail appearing, thin Cardiovascular: regular rate and rhythm Pulmonary: lung sounds clear; normal respiratory effort Abdomen: soft, nontender, + bowel sounds GU: no suprapubic tenderness Extremities: trace edema of ankles, no joint deformities Skin: no rashes Neurological: Weakness but otherwise nonfocal   PAST MEDICAL HISTORY:  Past Medical History:  Diagnosis Date  . Abdominal aortic aneurysm (AAA) ([place])   . ARF (acute renal failure) ([place]) 2011   after TKR  . BPH (benign prostatic hypertrophy)   . C. difficile diarrhea   . DVT (deep venous thrombosis) ([place]) 1998   after left knee arthroscopy  . Hx of colonoscopy 2000  . Hypertension   . Hypothyroid 2007   postop from thyroidectomy (cancer scare)  . Kidney stones    recurrent  . Oral cancer ([place]) 2004   graft from left thigh  . Osteoarthritis of both knees   . Parkinson's disease ([place])   . Pulmonary embolism ([place])   . Tendonitis 2012   from quinolone  . Toe amputation status 2010    SOCIAL HX:  Social History   Tobacco Use  . Smoking status: Former Smoker    Types: Cigars    Quit date: 04/27/2002    Years since quitting: 16.7  . Smokeless tobacco: Never Used  Substance Use Topics  . Alcohol  use: Not Currently    Alcohol/week: 0.0 standard drinks    ALLERGIES:  Allergies  Allergen Reactions  . Quinolones     tendonitis     PERTINENT MEDICATIONS:  Outpatient Encounter Medications as of 01/10/2019  Medication Sig  . acetaminophen (TYLENOL) 325 MG tablet Take 2 tablets (650 mg total) by mouth every 6 (six) hours as needed for mild pain or moderate pain (or Fever >/= 101).  Marland Kitchen amLODipine (NORVASC) 5 MG tablet TAKE 1 TABLET EVERY DAY (Patient not taking: Reported on 12/21/2018)  . apixaban (ELIQUIS) 2.5 MG TABS tablet Take 1 tablet (2.5 mg total) by mouth 2 (two) times daily.   . carbidopa-levodopa (SINEMET IR) 25-100 MG tablet TAKE 1 TABLET THREE TIMES DAILY (Patient taking differently: Take 1 tablet by mouth 4 (four) times daily. )  . Cholecalciferol (VITAMIN D) 2000 UNITS CAPS Take 2,000 Units by mouth daily.   Marland Kitchen levothyroxine (SYNTHROID) 112 MCG tablet TAKE 1 TABLET EVERY DAY BEFORE BREAKFAST  . ramelteon (ROZEREM) 8 MG tablet Take 1 tablet (8 mg total) by mouth at bedtime.  . vancomycin (VANCOCIN) 125 MG capsule Take 125 mg by mouth 4 (four) times daily.  . vancomycin (VANCOCIN) 50 mg/mL oral solution Take 2.5 mLs (125 mg total) by mouth every 6 (six) hours.  . vitamin B-12 (CYANOCOBALAMIN) 1000 MCG tablet Take 1,000 mcg by mouth daily.   No facility-administered encounter medications on file as of 01/10/2019.      Cristobal Advani Jenetta Downer, NP

## 2019-01-15 ENCOUNTER — Ambulatory Visit: Payer: Medicare HMO | Admitting: Neurology

## 2019-01-16 DIAGNOSIS — E538 Deficiency of other specified B group vitamins: Secondary | ICD-10-CM | POA: Diagnosis not present

## 2019-01-16 DIAGNOSIS — I129 Hypertensive chronic kidney disease with stage 1 through stage 4 chronic kidney disease, or unspecified chronic kidney disease: Secondary | ICD-10-CM | POA: Diagnosis not present

## 2019-01-16 DIAGNOSIS — E039 Hypothyroidism, unspecified: Secondary | ICD-10-CM | POA: Diagnosis not present

## 2019-01-16 DIAGNOSIS — G20 Parkinson's disease: Secondary | ICD-10-CM | POA: Diagnosis not present

## 2019-01-16 DIAGNOSIS — I714 Abdominal aortic aneurysm, without rupture: Secondary | ICD-10-CM | POA: Diagnosis not present

## 2019-01-16 DIAGNOSIS — M1711 Unilateral primary osteoarthritis, right knee: Secondary | ICD-10-CM | POA: Diagnosis not present

## 2019-01-16 DIAGNOSIS — I2782 Chronic pulmonary embolism: Secondary | ICD-10-CM | POA: Diagnosis not present

## 2019-01-16 DIAGNOSIS — N400 Enlarged prostate without lower urinary tract symptoms: Secondary | ICD-10-CM | POA: Diagnosis not present

## 2019-01-16 DIAGNOSIS — N183 Chronic kidney disease, stage 3 unspecified: Secondary | ICD-10-CM | POA: Diagnosis not present

## 2019-01-22 DIAGNOSIS — M1711 Unilateral primary osteoarthritis, right knee: Secondary | ICD-10-CM | POA: Diagnosis not present

## 2019-01-22 DIAGNOSIS — I714 Abdominal aortic aneurysm, without rupture: Secondary | ICD-10-CM | POA: Diagnosis not present

## 2019-01-22 DIAGNOSIS — N400 Enlarged prostate without lower urinary tract symptoms: Secondary | ICD-10-CM | POA: Diagnosis not present

## 2019-01-22 DIAGNOSIS — I129 Hypertensive chronic kidney disease with stage 1 through stage 4 chronic kidney disease, or unspecified chronic kidney disease: Secondary | ICD-10-CM | POA: Diagnosis not present

## 2019-01-22 DIAGNOSIS — I2782 Chronic pulmonary embolism: Secondary | ICD-10-CM | POA: Diagnosis not present

## 2019-01-22 DIAGNOSIS — G20 Parkinson's disease: Secondary | ICD-10-CM | POA: Diagnosis not present

## 2019-01-22 DIAGNOSIS — E538 Deficiency of other specified B group vitamins: Secondary | ICD-10-CM | POA: Diagnosis not present

## 2019-01-22 DIAGNOSIS — N183 Chronic kidney disease, stage 3 unspecified: Secondary | ICD-10-CM | POA: Diagnosis not present

## 2019-01-22 DIAGNOSIS — E039 Hypothyroidism, unspecified: Secondary | ICD-10-CM | POA: Diagnosis not present

## 2019-01-29 DIAGNOSIS — G20 Parkinson's disease: Secondary | ICD-10-CM | POA: Diagnosis not present

## 2019-01-29 DIAGNOSIS — E039 Hypothyroidism, unspecified: Secondary | ICD-10-CM | POA: Diagnosis not present

## 2019-01-29 DIAGNOSIS — I129 Hypertensive chronic kidney disease with stage 1 through stage 4 chronic kidney disease, or unspecified chronic kidney disease: Secondary | ICD-10-CM | POA: Diagnosis not present

## 2019-01-29 DIAGNOSIS — I714 Abdominal aortic aneurysm, without rupture: Secondary | ICD-10-CM | POA: Diagnosis not present

## 2019-01-29 DIAGNOSIS — N183 Chronic kidney disease, stage 3 unspecified: Secondary | ICD-10-CM | POA: Diagnosis not present

## 2019-01-29 DIAGNOSIS — E538 Deficiency of other specified B group vitamins: Secondary | ICD-10-CM | POA: Diagnosis not present

## 2019-01-29 DIAGNOSIS — M1711 Unilateral primary osteoarthritis, right knee: Secondary | ICD-10-CM | POA: Diagnosis not present

## 2019-01-29 DIAGNOSIS — I2782 Chronic pulmonary embolism: Secondary | ICD-10-CM | POA: Diagnosis not present

## 2019-01-29 DIAGNOSIS — N400 Enlarged prostate without lower urinary tract symptoms: Secondary | ICD-10-CM | POA: Diagnosis not present

## 2019-02-01 DIAGNOSIS — N183 Chronic kidney disease, stage 3 unspecified: Secondary | ICD-10-CM | POA: Diagnosis not present

## 2019-02-01 DIAGNOSIS — N400 Enlarged prostate without lower urinary tract symptoms: Secondary | ICD-10-CM | POA: Diagnosis not present

## 2019-02-01 DIAGNOSIS — M1711 Unilateral primary osteoarthritis, right knee: Secondary | ICD-10-CM | POA: Diagnosis not present

## 2019-02-01 DIAGNOSIS — I714 Abdominal aortic aneurysm, without rupture: Secondary | ICD-10-CM | POA: Diagnosis not present

## 2019-02-01 DIAGNOSIS — E538 Deficiency of other specified B group vitamins: Secondary | ICD-10-CM | POA: Diagnosis not present

## 2019-02-01 DIAGNOSIS — I129 Hypertensive chronic kidney disease with stage 1 through stage 4 chronic kidney disease, or unspecified chronic kidney disease: Secondary | ICD-10-CM | POA: Diagnosis not present

## 2019-02-01 DIAGNOSIS — I2782 Chronic pulmonary embolism: Secondary | ICD-10-CM | POA: Diagnosis not present

## 2019-02-01 DIAGNOSIS — E039 Hypothyroidism, unspecified: Secondary | ICD-10-CM | POA: Diagnosis not present

## 2019-02-01 DIAGNOSIS — G20 Parkinson's disease: Secondary | ICD-10-CM | POA: Diagnosis not present

## 2019-02-06 DIAGNOSIS — I129 Hypertensive chronic kidney disease with stage 1 through stage 4 chronic kidney disease, or unspecified chronic kidney disease: Secondary | ICD-10-CM | POA: Diagnosis not present

## 2019-02-06 DIAGNOSIS — E039 Hypothyroidism, unspecified: Secondary | ICD-10-CM | POA: Diagnosis not present

## 2019-02-06 DIAGNOSIS — I2782 Chronic pulmonary embolism: Secondary | ICD-10-CM | POA: Diagnosis not present

## 2019-02-06 DIAGNOSIS — I714 Abdominal aortic aneurysm, without rupture: Secondary | ICD-10-CM | POA: Diagnosis not present

## 2019-02-06 DIAGNOSIS — N183 Chronic kidney disease, stage 3 unspecified: Secondary | ICD-10-CM | POA: Diagnosis not present

## 2019-02-06 DIAGNOSIS — N400 Enlarged prostate without lower urinary tract symptoms: Secondary | ICD-10-CM | POA: Diagnosis not present

## 2019-02-06 DIAGNOSIS — E538 Deficiency of other specified B group vitamins: Secondary | ICD-10-CM | POA: Diagnosis not present

## 2019-02-06 DIAGNOSIS — M1711 Unilateral primary osteoarthritis, right knee: Secondary | ICD-10-CM | POA: Diagnosis not present

## 2019-02-06 DIAGNOSIS — G20 Parkinson's disease: Secondary | ICD-10-CM | POA: Diagnosis not present

## 2019-02-08 DIAGNOSIS — N183 Chronic kidney disease, stage 3 unspecified: Secondary | ICD-10-CM | POA: Diagnosis not present

## 2019-02-08 DIAGNOSIS — E538 Deficiency of other specified B group vitamins: Secondary | ICD-10-CM | POA: Diagnosis not present

## 2019-02-08 DIAGNOSIS — E039 Hypothyroidism, unspecified: Secondary | ICD-10-CM | POA: Diagnosis not present

## 2019-02-08 DIAGNOSIS — N400 Enlarged prostate without lower urinary tract symptoms: Secondary | ICD-10-CM | POA: Diagnosis not present

## 2019-02-08 DIAGNOSIS — I714 Abdominal aortic aneurysm, without rupture: Secondary | ICD-10-CM | POA: Diagnosis not present

## 2019-02-08 DIAGNOSIS — I2782 Chronic pulmonary embolism: Secondary | ICD-10-CM | POA: Diagnosis not present

## 2019-02-08 DIAGNOSIS — I129 Hypertensive chronic kidney disease with stage 1 through stage 4 chronic kidney disease, or unspecified chronic kidney disease: Secondary | ICD-10-CM | POA: Diagnosis not present

## 2019-02-08 DIAGNOSIS — M1711 Unilateral primary osteoarthritis, right knee: Secondary | ICD-10-CM | POA: Diagnosis not present

## 2019-02-08 DIAGNOSIS — G20 Parkinson's disease: Secondary | ICD-10-CM | POA: Diagnosis not present

## 2019-02-13 DIAGNOSIS — I714 Abdominal aortic aneurysm, without rupture: Secondary | ICD-10-CM | POA: Diagnosis not present

## 2019-02-13 DIAGNOSIS — E039 Hypothyroidism, unspecified: Secondary | ICD-10-CM | POA: Diagnosis not present

## 2019-02-13 DIAGNOSIS — I2782 Chronic pulmonary embolism: Secondary | ICD-10-CM | POA: Diagnosis not present

## 2019-02-13 DIAGNOSIS — I129 Hypertensive chronic kidney disease with stage 1 through stage 4 chronic kidney disease, or unspecified chronic kidney disease: Secondary | ICD-10-CM | POA: Diagnosis not present

## 2019-02-13 DIAGNOSIS — M1711 Unilateral primary osteoarthritis, right knee: Secondary | ICD-10-CM | POA: Diagnosis not present

## 2019-02-13 DIAGNOSIS — E538 Deficiency of other specified B group vitamins: Secondary | ICD-10-CM | POA: Diagnosis not present

## 2019-02-13 DIAGNOSIS — G20 Parkinson's disease: Secondary | ICD-10-CM | POA: Diagnosis not present

## 2019-02-13 DIAGNOSIS — N183 Chronic kidney disease, stage 3 unspecified: Secondary | ICD-10-CM | POA: Diagnosis not present

## 2019-02-13 DIAGNOSIS — N400 Enlarged prostate without lower urinary tract symptoms: Secondary | ICD-10-CM | POA: Diagnosis not present

## 2019-02-15 DIAGNOSIS — I2782 Chronic pulmonary embolism: Secondary | ICD-10-CM | POA: Diagnosis not present

## 2019-02-15 DIAGNOSIS — E538 Deficiency of other specified B group vitamins: Secondary | ICD-10-CM | POA: Diagnosis not present

## 2019-02-15 DIAGNOSIS — G20 Parkinson's disease: Secondary | ICD-10-CM | POA: Diagnosis not present

## 2019-02-15 DIAGNOSIS — M1711 Unilateral primary osteoarthritis, right knee: Secondary | ICD-10-CM | POA: Diagnosis not present

## 2019-02-15 DIAGNOSIS — N183 Chronic kidney disease, stage 3 unspecified: Secondary | ICD-10-CM | POA: Diagnosis not present

## 2019-02-15 DIAGNOSIS — I129 Hypertensive chronic kidney disease with stage 1 through stage 4 chronic kidney disease, or unspecified chronic kidney disease: Secondary | ICD-10-CM | POA: Diagnosis not present

## 2019-02-15 DIAGNOSIS — I714 Abdominal aortic aneurysm, without rupture: Secondary | ICD-10-CM | POA: Diagnosis not present

## 2019-02-15 DIAGNOSIS — E039 Hypothyroidism, unspecified: Secondary | ICD-10-CM | POA: Diagnosis not present

## 2019-02-15 DIAGNOSIS — N400 Enlarged prostate without lower urinary tract symptoms: Secondary | ICD-10-CM | POA: Diagnosis not present

## 2019-02-19 DIAGNOSIS — M1711 Unilateral primary osteoarthritis, right knee: Secondary | ICD-10-CM | POA: Diagnosis not present

## 2019-02-19 DIAGNOSIS — N183 Chronic kidney disease, stage 3 unspecified: Secondary | ICD-10-CM | POA: Diagnosis not present

## 2019-02-19 DIAGNOSIS — E538 Deficiency of other specified B group vitamins: Secondary | ICD-10-CM | POA: Diagnosis not present

## 2019-02-19 DIAGNOSIS — I714 Abdominal aortic aneurysm, without rupture: Secondary | ICD-10-CM | POA: Diagnosis not present

## 2019-02-19 DIAGNOSIS — I129 Hypertensive chronic kidney disease with stage 1 through stage 4 chronic kidney disease, or unspecified chronic kidney disease: Secondary | ICD-10-CM | POA: Diagnosis not present

## 2019-02-19 DIAGNOSIS — N400 Enlarged prostate without lower urinary tract symptoms: Secondary | ICD-10-CM | POA: Diagnosis not present

## 2019-02-19 DIAGNOSIS — E039 Hypothyroidism, unspecified: Secondary | ICD-10-CM | POA: Diagnosis not present

## 2019-02-19 DIAGNOSIS — I2782 Chronic pulmonary embolism: Secondary | ICD-10-CM | POA: Diagnosis not present

## 2019-02-19 DIAGNOSIS — G20 Parkinson's disease: Secondary | ICD-10-CM | POA: Diagnosis not present

## 2019-02-20 DIAGNOSIS — E538 Deficiency of other specified B group vitamins: Secondary | ICD-10-CM | POA: Diagnosis not present

## 2019-02-20 DIAGNOSIS — N183 Chronic kidney disease, stage 3 unspecified: Secondary | ICD-10-CM | POA: Diagnosis not present

## 2019-02-20 DIAGNOSIS — I2782 Chronic pulmonary embolism: Secondary | ICD-10-CM | POA: Diagnosis not present

## 2019-02-20 DIAGNOSIS — I714 Abdominal aortic aneurysm, without rupture: Secondary | ICD-10-CM | POA: Diagnosis not present

## 2019-02-20 DIAGNOSIS — E039 Hypothyroidism, unspecified: Secondary | ICD-10-CM | POA: Diagnosis not present

## 2019-02-20 DIAGNOSIS — M1711 Unilateral primary osteoarthritis, right knee: Secondary | ICD-10-CM | POA: Diagnosis not present

## 2019-02-20 DIAGNOSIS — I129 Hypertensive chronic kidney disease with stage 1 through stage 4 chronic kidney disease, or unspecified chronic kidney disease: Secondary | ICD-10-CM | POA: Diagnosis not present

## 2019-02-20 DIAGNOSIS — G20 Parkinson's disease: Secondary | ICD-10-CM | POA: Diagnosis not present

## 2019-02-20 DIAGNOSIS — N400 Enlarged prostate without lower urinary tract symptoms: Secondary | ICD-10-CM | POA: Diagnosis not present

## 2019-02-27 ENCOUNTER — Telehealth: Payer: Self-pay | Admitting: Internal Medicine

## 2019-02-27 DIAGNOSIS — I714 Abdominal aortic aneurysm, without rupture: Secondary | ICD-10-CM | POA: Diagnosis not present

## 2019-02-27 DIAGNOSIS — I129 Hypertensive chronic kidney disease with stage 1 through stage 4 chronic kidney disease, or unspecified chronic kidney disease: Secondary | ICD-10-CM | POA: Diagnosis not present

## 2019-02-27 DIAGNOSIS — E039 Hypothyroidism, unspecified: Secondary | ICD-10-CM | POA: Diagnosis not present

## 2019-02-27 DIAGNOSIS — E538 Deficiency of other specified B group vitamins: Secondary | ICD-10-CM | POA: Diagnosis not present

## 2019-02-27 DIAGNOSIS — M1711 Unilateral primary osteoarthritis, right knee: Secondary | ICD-10-CM | POA: Diagnosis not present

## 2019-02-27 DIAGNOSIS — N400 Enlarged prostate without lower urinary tract symptoms: Secondary | ICD-10-CM | POA: Diagnosis not present

## 2019-02-27 DIAGNOSIS — I2782 Chronic pulmonary embolism: Secondary | ICD-10-CM | POA: Diagnosis not present

## 2019-02-27 DIAGNOSIS — N183 Chronic kidney disease, stage 3 unspecified: Secondary | ICD-10-CM | POA: Diagnosis not present

## 2019-02-27 DIAGNOSIS — G20 Parkinson's disease: Secondary | ICD-10-CM | POA: Diagnosis not present

## 2019-02-27 NOTE — Telephone Encounter (Signed)
error 

## 2019-02-27 NOTE — Telephone Encounter (Signed)
Patient's wife called She is requesting a call back from the nurse   She stated that the patient is having oral surgery and she wanted to make sure he is in good health for this.

## 2019-02-27 NOTE — Telephone Encounter (Signed)
Left message ov verified VM that there is not much I can advise her about his health. This would be something for Dr Silvio Pate to answer. Advised her to call with specific questions.

## 2019-02-28 ENCOUNTER — Telehealth: Payer: Self-pay | Admitting: *Deleted

## 2019-02-28 ENCOUNTER — Encounter: Payer: Self-pay | Admitting: *Deleted

## 2019-02-28 NOTE — Telephone Encounter (Signed)
  I haven't seen patient in over a year.  I believe that Dr Janese Banks has been seeing him.  M

## 2019-02-28 NOTE — Telephone Encounter (Signed)
Patient needs tooth extraction by Dr Wilkie Aye and she needs to know if he can come off of his Xarelto for 3 days to get this done. Please advise, send fax to Dr Wilkie Aye and let [name] know when done

## 2019-02-28 NOTE — Telephone Encounter (Signed)
Letter made, md to sign and I will fax to dentist office

## 2019-02-28 NOTE — Telephone Encounter (Signed)
Wife informed that ok to hold Eliquis for 3 days and to start back right after the extraction and that offic ewill send note to Dr Wilkie Aye

## 2019-02-28 NOTE — Telephone Encounter (Signed)
Ok to hold eliquis for 3 days and start right after the extraction

## 2019-03-04 ENCOUNTER — Telehealth: Payer: Self-pay | Admitting: *Deleted

## 2019-03-04 DIAGNOSIS — G20 Parkinson's disease: Secondary | ICD-10-CM | POA: Diagnosis not present

## 2019-03-04 DIAGNOSIS — Z89421 Acquired absence of other right toe(s): Secondary | ICD-10-CM | POA: Diagnosis not present

## 2019-03-04 DIAGNOSIS — Z9181 History of falling: Secondary | ICD-10-CM

## 2019-03-04 DIAGNOSIS — E039 Hypothyroidism, unspecified: Secondary | ICD-10-CM | POA: Diagnosis not present

## 2019-03-04 DIAGNOSIS — Z87891 Personal history of nicotine dependence: Secondary | ICD-10-CM

## 2019-03-04 DIAGNOSIS — Z86718 Personal history of other venous thrombosis and embolism: Secondary | ICD-10-CM | POA: Diagnosis not present

## 2019-03-04 DIAGNOSIS — Z85818 Personal history of malignant neoplasm of other sites of lip, oral cavity, and pharynx: Secondary | ICD-10-CM

## 2019-03-04 DIAGNOSIS — M1711 Unilateral primary osteoarthritis, right knee: Secondary | ICD-10-CM | POA: Diagnosis not present

## 2019-03-04 DIAGNOSIS — I2782 Chronic pulmonary embolism: Secondary | ICD-10-CM | POA: Diagnosis not present

## 2019-03-04 DIAGNOSIS — N400 Enlarged prostate without lower urinary tract symptoms: Secondary | ICD-10-CM | POA: Diagnosis not present

## 2019-03-04 DIAGNOSIS — E538 Deficiency of other specified B group vitamins: Secondary | ICD-10-CM | POA: Diagnosis not present

## 2019-03-04 DIAGNOSIS — Z7901 Long term (current) use of anticoagulants: Secondary | ICD-10-CM | POA: Diagnosis not present

## 2019-03-04 DIAGNOSIS — I129 Hypertensive chronic kidney disease with stage 1 through stage 4 chronic kidney disease, or unspecified chronic kidney disease: Secondary | ICD-10-CM | POA: Diagnosis not present

## 2019-03-04 DIAGNOSIS — N183 Chronic kidney disease, stage 3 unspecified: Secondary | ICD-10-CM | POA: Diagnosis not present

## 2019-03-04 DIAGNOSIS — I714 Abdominal aortic aneurysm, without rupture: Secondary | ICD-10-CM | POA: Diagnosis not present

## 2019-03-04 NOTE — Telephone Encounter (Signed)
Patient's wife returned Shannon's call.  She said the issue has been resolved.  She called patient's specialist.

## 2019-03-04 NOTE — Telephone Encounter (Signed)
Stephanie with [facility] left a voicemail stating that patient has changed insurance and they need to discharge him from their services. [facility] [place] stated that they will re-admit the patient if Dr. Silvio Pate is okay with that. Colletta [place] requested a call back.

## 2019-03-05 ENCOUNTER — Encounter: Payer: PPO | Admitting: Nurse Practitioner

## 2019-03-05 ENCOUNTER — Non-Acute Institutional Stay: Payer: PPO | Admitting: Adult Health Nurse Practitioner

## 2019-03-05 ENCOUNTER — Other Ambulatory Visit: Payer: Self-pay

## 2019-03-05 DIAGNOSIS — G20 Parkinson's disease: Secondary | ICD-10-CM

## 2019-03-05 DIAGNOSIS — Z515 Encounter for palliative care: Secondary | ICD-10-CM

## 2019-03-05 DIAGNOSIS — G20A1 Parkinson's disease without dyskinesia, without mention of fluctuations: Secondary | ICD-10-CM

## 2019-03-05 NOTE — Progress Notes (Signed)
03/06/2019 2:19 PM   Walter [name] 05-16-1931 [id number]  Referring provider: Venia Carbon, MD [address] [place],  [place] [address]  Chief Complaint  Patient presents with  . Hematuria    HPI: Patient is an 86 year old male with a history of nephrolithiasis who presents today for gross hematuria.   He was found to have an 8 mm impacted right UVJ stone associated with right ureteral stones and underwent a right URS/LL/stent placement with cysto stent removal on 10/06/2017.    RUS on 11/22/2017 revealed interval resolution of hydronephrosis on the right. There is moderate diffuse renal cortical atrophy on the right. Simple appearing lower pole cyst.  Multiple nonobstructing lower pole cysts in the left kidney. No cortical thinning or hydronephrosis.  Partially distended urinary bladder with subjective mild wall thickening to approximately 8 mm.  Stone composition was 30% calcium oxalate dihydrate, 55% calcium oxalate monohydrate and 15% calcium phosphate.    Today, he states he is having frequency, nocturia, incontinence and occasional gross hematuria.  His wife states that she has seen a clot pass at one point.  Patient denies any dysuria or suprapubic/flank pain.  Patient denies any fevers, chills, nausea or vomiting.    UA red cloudy, 3+ blood and 2+ protein on dip and 0-5 WBCs, greater than 30 RBCs and 0-10 epithelial cells microscopically.Marland Kitchen   PVR 60 mL.    Contrast CT 11/10/2018 Circumferential bladder wall thickening with hyperdense mural nodule in the dome of the bladder, concerning for bladder neoplasm.  Recommend further evaluation with cystoscopy. Furthermore recommend correlation with urinalysis to exclude current cystitis.   PMH: Past Medical History:  Diagnosis Date  . Abdominal aortic aneurysm (AAA) ([place])   . ARF (acute renal failure) ([place]) 2011   after TKR  . BPH (benign prostatic hypertrophy)   . C. difficile diarrhea   . DVT (deep venous thrombosis)  ([place]) 1998   after left knee arthroscopy  . Hx of colonoscopy 2000  . Hypertension   . Hypothyroid 2007   postop from thyroidectomy (cancer scare)  . Kidney stones    recurrent  . Oral cancer ([place]) 2004   graft from left thigh  . Osteoarthritis of both knees   . Parkinson's disease ([place])   . Pulmonary embolism ([place])   . Tendonitis 2012   from quinolone  . Toe amputation status 2010    Surgical History: Past Surgical History:  Procedure Laterality Date  . APPENDECTOMY    . CHOLECYSTECTOMY    . CYSTOSCOPY/URETEROSCOPY/HOLMIUM LASER/STENT PLACEMENT Right 09/13/2017   Procedure: CYSTOSCOPY/URETEROSCOPY/HOLMIUM LASER/STENT PLACEMENT;  Surgeon: Hollice Espy, MD;  Location: [facility] ORS;  Service: Urology;  Laterality: Right;  . HERNIA REPAIR    . INTRAMEDULLARY (IM) NAIL INTERTROCHANTERIC Right 06/13/2017   Procedure: INTRAMEDULLARY (IM) NAIL INTERTROCHANTRIC;  Surgeon: Hessie Knows, MD;  Location: [facility] ORS;  Service: Orthopedics;  Laterality: Right;  . LITHOTRIPSY  2009/2010   then stent and cystoscopic removal 2014  . Oral cancer removed Right 2004  . REPAIR ZENKER'S DIVERTICULA  2013  . THYROIDECTOMY  2007  . TOE AMPUTATION Right 2010   badly overlapping other toe  . TOTAL KNEE ARTHROPLASTY Left   . TRANSURETHRAL RESECTION OF PROSTATE  2011  . UMBILICAL HERNIA REPAIR  2007    Home Medications:  Allergies as of 03/06/2019      Reactions   Quinolones    tendonitis      Medication List       Accurate  as of March 06, 2019 11:59 PM. If you have any questions, ask your nurse or doctor.        acetaminophen 325 MG tablet Commonly known as: TYLENOL Take 2 tablets (650 mg total) by mouth every 6 (six) hours as needed for mild pain or moderate pain (or Fever >/= 101).   amLODipine 5 MG tablet Commonly known as: NORVASC TAKE 1 TABLET EVERY DAY   apixaban 2.5 MG Tabs tablet Commonly known as: ELIQUIS Take 1 tablet (2.5 mg total) by mouth 2 (two) times daily.     carbidopa-levodopa 25-100 MG tablet Commonly known as: SINEMET IR TAKE 1 TABLET THREE TIMES DAILY What changed: when to take this   levothyroxine 112 MCG tablet Commonly known as: SYNTHROID TAKE 1 TABLET EVERY DAY BEFORE BREAKFAST   ramelteon 8 MG tablet Commonly known as: Rozerem Take 1 tablet (8 mg total) by mouth at bedtime.   vancomycin 125 MG capsule Commonly known as: VANCOCIN Take 125 mg by mouth 4 (four) times daily.   vancomycin 50 mg/mL  oral solution Commonly known as: VANCOCIN Take 2.5 mLs (125 mg total) by mouth every 6 (six) hours.   vitamin B-12 1000 MCG tablet Commonly known as: CYANOCOBALAMIN Take 1,000 mcg by mouth daily.   Vitamin D 50 MCG (2000 UT) Caps Take 2,000 Units by mouth daily.       Allergies:  Allergies  Allergen Reactions  . Quinolones     tendonitis    Family History: Family History  Problem Relation Age of Onset  . Dementia Mother   . Stroke Father   . Pulmonary disease Brother   . Heart disease Neg Hx   . Diabetes Neg Hx     Social History:  reports that he quit smoking about 16 years ago. His smoking use included cigars. He has never used smokeless tobacco. He reports previous alcohol use. He reports that he does not use drugs.  ROS: UROLOGY Frequent Urination?: Yes Hard to postpone urination?: No Burning/pain with urination?: No Get up at night to urinate?: Yes Leakage of urine?: Yes Urine stream starts and stops?: No Trouble starting stream?: No Do you have to strain to urinate?: No Blood in urine?: Yes Urinary tract infection?: No Sexually transmitted disease?: No Injury to kidneys or bladder?: No Painful intercourse?: No Weak stream?: No Erection problems?: No Penile pain?: No  Gastrointestinal Nausea?: No Vomiting?: No Indigestion/heartburn?: No Diarrhea?: No Constipation?: No  Constitutional Fever: No Night sweats?: No Weight loss?: No Fatigue?: No  Skin Skin rash/lesions?: No Itching?:  No  Eyes Blurred vision?: No Double vision?: No  Ears/Nose/Throat Sore throat?: No Sinus problems?: No  Hematologic/Lymphatic Swollen glands?: No Easy bruising?: No  Cardiovascular Leg swelling?: No Chest pain?: No  Respiratory Cough?: No Shortness of breath?: No  Endocrine Excessive thirst?: No  Musculoskeletal Back pain?: No Joint pain?: No  Neurological Headaches?: No Dizziness?: No  Psychologic Depression?: No Anxiety?: No  Physical Exam: BP 134/78   Pulse 75   Ht 5\' 10"  (1.778 m)   Wt 175 lb (79.4 kg)   BMI 25.11 kg/m   Constitutional:  Well nourished. Alert and oriented, No acute distress. HEENT: [place] AT, mask in place  Trachea midline, no masses. Cardiovascular: No clubbing, cyanosis, or edema. Respiratory: Normal respiratory effort, no increased work of breathing. Neurologic: Grossly intact, no focal deficits, moving all 4 extremities.  In wheelchair.  Psychiatric: Normal mood and affect.   Laboratory Data: Lab Results  Component Value Date  WBC 5.6 12/19/2018   HGB 14.2 12/19/2018   HCT 44.8 12/19/2018   MCV 92.2 12/19/2018   PLT 170 12/19/2018    Lab Results  Component Value Date   CREATININE 1.49 (H) 12/19/2018    Lab Results  Component Value Date   PSA 3.56 03/21/2016    No results found for: TESTOSTERONE  No results found for: HGBA1C  Lab Results  Component Value Date   TSH 0.569 11/11/2018       Component Value Date/Time   CHOL 155 07/31/2013 1320   HDL 34.60 (L) 07/31/2013 1320   CHOLHDL 4 07/31/2013 1320   VLDL 24.6 07/31/2013 1320   LDLCALC 96 07/31/2013 1320    Lab Results  Component Value Date   AST 16 12/19/2018   Lab Results  Component Value Date   ALT 10 12/19/2018   No components found for: ALKALINEPHOPHATASE No components found for: BILIRUBINTOTAL  No results found for: ESTRADIOL  Urinalysis Component     Latest Ref Rng & Units 03/13/2019  Specific Gravity, UA     1.005 - 1.030 1.025  pH,  UA     5.0 - 7.5 6.0  Color, UA     Yellow Brown (A)  Appearance Ur     Clear Cloudy (A)  Leukocytes,UA     Negative Trace (A)  Protein,UA     Negative/Trace 1+ (A)  Glucose, UA     Negative Negative  Ketones, UA     Negative Negative  RBC, UA     Negative 3+ (A)  Bilirubin, UA     Negative Negative  Urobilinogen, Ur     0.2 - 1.0 mg/dL 0.2  Nitrite, UA     Negative Negative  Microscopic Examination      See below:   Component     Latest Ref Rng & Units 03/06/2019  WBC, UA     0 - 5 /hpf 0-5  RBC     0 - 2 /hpf >30 (A)  Epithelial Cells (non renal)     0 - 10 /hpf 0-10  Bacteria, UA     None seen/Few None seen     I have reviewed the labs.   Pertinent Imaging: CLINICAL DATA:  Nausea, vomiting, fall yesterday  EXAM: CT ABDOMEN AND PELVIS WITH CONTRAST  TECHNIQUE: Multidetector CT imaging of the abdomen and pelvis was performed using the standard protocol following bolus administration of intravenous contrast.  CONTRAST:  39mL OMNIPAQUE IOHEXOL 300 MG/ML  SOLN  COMPARISON:  Numerous priors, most recently CT renal colic [id number]  FINDINGS: Lower chest: Bandlike areas of basilar scarring and and atelectasis, with associated subpleural nodular thickening in the left lung base measuring up to 12 mm in size, unchanged from multiple comparisons. Additional scarring in the right middle lobe and lingula. Normal heart size. No pericardial effusion. Atherosclerotic calcification of the coronary arteries. Aortic leaflet calcifications are present.  Hepatobiliary: No focal liver abnormality is seen. Patient is post cholecystectomy. Slight prominence of the biliary tree likely related to reservoir effect. No calcified intraductal gallstones.  Pancreas: Atrophic appearance of the pancreas. Pancreas is otherwise unremarkable. No peripancreatic inflammation, visible pancreatic lesions or pancreatic ductal dilatation.  Spleen: Normal in size without focal  abnormality.  Adrenals/Urinary Tract: Normal adrenal glands. Bilateral nonobstructing nephrolithiasis is present. No obstructive urolithiasis. Right extrarenal pelvis is similar to prior study. Right ureter passes in close proximity to an atherosclerotic calcification mimicking a ureteral calculus but was present on several prior comparisons (3/59). Redemonstration  of the large 8.6 cm fluid attenuation cyst arising from the lower pole right kidney. Small thin peripheral calcification but no other concerning feature (Bosniak II). No concerning renal lesions. There is circumferential bladder wall thickening. Hyperdense mural nodule is noted in the dome of the bladder.  Stomach/Bowel: Distal esophagus, stomach and duodenal sweep are unremarkable. No bowel wall thickening or dilatation. No evidence of obstruction. A normal appendix is visualized. There is pancolonic mural thickening and extensive pericolonic hazy stranding most pronounced at the level of the splenic flexure and beyond to the level of the rectum.  Vascular/Lymphatic: Atherosclerotic plaque within the normal caliber aorta. There is fusiform dilatation of the proximal aorta measuring up to 3.5 cm. Fusiform infrarenal abdominal aortic ectasia up to 3.2 cm is present and grossly similar to comparison study. Extensive ostial plaque results in multilevel narrowing, incompletely evaluated on this non angiographic exam. Scattered prominent though nonenlarged lymph nodes are present in the retroperitoneum.  Reproductive: Prostatomegaly. Coarse eccentric calcification of the prostate. No concerning abnormalities of the prostate or seminal vesicles.  Other: No abdominopelvic free fluid or free gas. No bowel containing hernias. Small fat containing left inguinal hernia.  Musculoskeletal: Right femoral intramedullary nail with transcervical cancellous screw transfixing a remote posttraumatic deformity of the right femoral  neck. Multilevel degenerative changes are present in the imaged portions of the spine. No acute osseous abnormality or suspicious osseous lesion. Bone mineralization is diffusely diminished. Additional degenerative features are present in the bones of the hands and wrists incidentally imaged on this exam.  IMPRESSION: 1. Pancolonic mural thickening and extensive pericolonic hazy stranding, most pronounced at the level of the splenic flexure and beyond to the level of the rectum, consistent with colitis, either infectious or inflammatory in etiology. Vascular etiology is less favored given the diffuse nature. 2. Circumferential bladder wall thickening with hyperdense mural nodule in the dome of the bladder, concerning for bladder neoplasm. Recommend further evaluation with cystoscopy. Furthermore recommend correlation with urinalysis to exclude current cystitis. 3. Fusiform infrarenal abdominal aortic ectasia measuring up to 3.2 cm, grossly similar to comparison study. Recommend followup by ultrasound in 3 years. This recommendation follows ACR consensus guidelines: White Paper of the ACR Incidental Findings Committee II on Vascular Findings. J Am Coll Radiol 2013; 10:789-794. Aortic aneurysm NOS (ICD10-I71.9) 4. Bilateral nonobstructing nephrolithiasis. Right extrarenal pelvis. 5. Prior ORIF of a right transcervical femoral fracture with intramedullary nail placement. 6. Extensive left basilar scarring with associated nodular thickening, unchanged since 2019. 7. Aortic Atherosclerosis (ICD10-I70.0).   Electronically Signed   By: Lovena Le M.D.   On: 11/10/2018 23:58  I have independently reviewed the films and appreciate the bladder lesion  Assessment & Plan:    1. Bladder mass Explained to the patient that the best way to further evaluate this area in his bladder is with cystoscopy especially in the setting of intermittent gross hematuria and passage of a clot I have  explained to the patient that they will  be scheduled for a cystoscopy in our office to evaluate their bladder.  The cystoscopy consists of passing a tube with a lens up through their urethra and into their urinary bladder.   We will inject the urethra with a lidocaine gel prior to introducing the cystoscope to help with any discomfort during the procedure.   After the procedure, they might experience blood in the urine and discomfort with urination.  This will abate after the first few voids.  I have  encouraged the patient to  increase water intake  during this time.  Patient denies any allergies to lidocaine.  They are agreeable and will proceed with the cystoscopy   2. Gross hematuria UA dark orange with > 30 RBC's - will send for culture - hold on antibiotic until culture is available  Cystoscopy is pending  3. Bilateral nephrolithiasis Offered KUB, but patient was too tired and will have his Xray another day   Return for cystoscopy for a possible bladder mass seen on september 2020 CT .  These notes generated with voice recognition software. I apologize for typographical errors.  Zara Council, PA-C  [facility] [address]  [place], [place] [address] [phone]

## 2019-03-05 NOTE — Telephone Encounter (Signed)
Verbal Orders given to Bailey. She said she will need a F2F note sent. He has an appt 03-26-19. I will note that a F2F needs to be done.

## 2019-03-05 NOTE — Progress Notes (Signed)
.  err This encounter was created in error - please disregard. 

## 2019-03-05 NOTE — Progress Notes (Addendum)
[facility] Consult Note Telephone: [phone]  Fax: [phone]  PATIENT NAME: Walter [name] DOB: [birth year] MRN: [id number]  PRIMARY CARE PROVIDER:   Venia Carbon, MD  REFERRING PROVIDER:  Venia Carbon, MD [name],  [name] [id number]  RESPONSIBLE PARTY:   Wife, Walter [name] [phone]    RECOMMENDATIONS and PLAN:  1.  Advanced care planning.  Patient is a DNR  2.  Parkinson's disease.  Patient has done really well with PT.  He is able to walk with walker to mailbox and back and sometimes make full loop around group of homes at [facility] where he lives.  Waiting for insurance to see if they will approve further PT services.  No reported falls.  Patient has no new complaints today and states that he is doing fine.  Continue follow up and recommendations by neurology.  3.  Nutrition.  Wife prepares meals that are pureed and he continues to use [employer] for pureed meal delivery.  He is doing well with the puree diet and has maintained his weight.  Continue current meal plan  4.  Hematuria.  Wife noticed some small blood clots in patient's urine last night and has noticed that urine looks discolored and cloudy.  Patient does have history of kidney stones.  Patient denies any pain, dysuria, frequency, change in stream.  He is afebrile.  Does not have suprapubic or CVA tenderness.  Was able to get in touch with [facility] and has appointment set up for tomorrow at 2pm.  5.  Support.  His wife is his primary caregiver.  Patient voices no concerns for himself today but is concerned about his wife.  States that she does a lot for him.  She has chronic back pain that is unrelieved and he states that she does spend a lot of time in bed and is tearful due to the pain. States it bothers him to see her in so much pain and feels like he is getting so much attention from different providers and that she could use help as  well.  I spent 60 minutes providing this consultation,  from 11:00 to 12:00. More than 50% of the time in this consultation was spent coordinating communication.   HISTORY OF PRESENT ILLNESS:  Walter [name] is a 86 y.o. year old male with multiple medical problems including Parkinson's disease, HTN, BPH, h/o oral cancer, hypothyroidism. Palliative Care was asked to help address goals of care.   CODE STATUS: DNR  PPS: 50% HOSPICE ELIGIBILITY/DIAGNOSIS: TBD  PHYSICAL EXAM:  BP 112/62  HR 48  O2 98% on RA  Temp 98.0 General: NAD, frail appearing, thin Cardiovascular: regular rate and rhythm Pulmonary: lung sounds clear; normal respiratory effort Abdomen: soft, nontender, + bowel sounds GU: no suprapubic tenderness; no CVA tenderness Extremities: trace edema of feet and ankles, no joint deformities Skin: skin tears to left forearm and right upper arm (did not take off bandages to exam as wife states that the one on right arm is healing and the one of left arm just occurred yesterday; is instructed to call if looks as if not healing well) Neurological: Weakness but otherwise nonfocal   PAST MEDICAL HISTORY:  Past Medical History:  Diagnosis Date  . Abdominal aortic aneurysm (AAA) ([place])   . ARF (acute renal failure) ([employer]) 2011   after TKR  . BPH (benign prostatic hypertrophy)   . C. difficile diarrhea   .  DVT (deep venous thrombosis) ([place]) 1998   after left knee arthroscopy  . Hx of colonoscopy 2000  . Hypertension   . Hypothyroid 2007   postop from thyroidectomy (cancer scare)  . Kidney stones    recurrent  . Oral cancer ([place]) 2004   graft from left thigh  . Osteoarthritis of both knees   . Parkinson's disease ([place])   . Pulmonary embolism ([place])   . Tendonitis 2012   from quinolone  . Toe amputation status 2010    SOCIAL HX:  Social History   Tobacco Use  . Smoking status: Former Smoker    Types: Cigars    Quit date: 04/27/2002    Years since quitting: 16.8  . Smokeless  tobacco: Never Used  Substance Use Topics  . Alcohol use: Not Currently    Alcohol/week: 0.0 standard drinks    ALLERGIES:  Allergies  Allergen Reactions  . Quinolones     tendonitis     PERTINENT MEDICATIONS:  Outpatient Encounter Medications as of 03/05/2019  Medication Sig  . acetaminophen (TYLENOL) 325 MG tablet Take 2 tablets (650 mg total) by mouth every 6 (six) hours as needed for mild pain or moderate pain (or Fever >/= 101).  Marland Kitchen amLODipine (NORVASC) 5 MG tablet TAKE 1 TABLET EVERY DAY (Patient not taking: Reported on 12/21/2018)  . apixaban (ELIQUIS) 2.5 MG TABS tablet Take 1 tablet (2.5 mg total) by mouth 2 (two) times daily.  . carbidopa-levodopa (SINEMET IR) 25-100 MG tablet TAKE 1 TABLET THREE TIMES DAILY (Patient taking differently: Take 1 tablet by mouth 4 (four) times daily. )  . Cholecalciferol (VITAMIN D) 2000 UNITS CAPS Take 2,000 Units by mouth daily.   Marland Kitchen levothyroxine (SYNTHROID) 112 MCG tablet TAKE 1 TABLET EVERY DAY BEFORE BREAKFAST  . ramelteon (ROZEREM) 8 MG tablet Take 1 tablet (8 mg total) by mouth at bedtime.  . vancomycin (VANCOCIN) 125 MG capsule Take 125 mg by mouth 4 (four) times daily.  . vancomycin (VANCOCIN) 50 mg/mL oral solution Take 2.5 mLs (125 mg total) by mouth every 6 (six) hours.  . vitamin B-12 (CYANOCOBALAMIN) 1000 MCG tablet Take 1,000 mcg by mouth daily.   No facility-administered encounter medications on file as of 03/05/2019.     Walter Minish Jenetta Downer, NP

## 2019-03-05 NOTE — Telephone Encounter (Signed)
Okay to readmit under the new insurance

## 2019-03-06 ENCOUNTER — Other Ambulatory Visit: Payer: Self-pay

## 2019-03-06 ENCOUNTER — Encounter: Payer: Self-pay | Admitting: Urology

## 2019-03-06 ENCOUNTER — Ambulatory Visit: Payer: PPO | Admitting: Urology

## 2019-03-06 VITALS — BP 134/78 | HR 75 | Ht 70.0 in | Wt 175.0 lb

## 2019-03-06 DIAGNOSIS — N200 Calculus of kidney: Secondary | ICD-10-CM

## 2019-03-06 DIAGNOSIS — N3289 Other specified disorders of bladder: Secondary | ICD-10-CM

## 2019-03-06 DIAGNOSIS — R310 Gross hematuria: Secondary | ICD-10-CM | POA: Diagnosis not present

## 2019-03-06 LAB — BLADDER SCAN AMB NON-IMAGING: Scan Result: 60

## 2019-03-07 DIAGNOSIS — I10 Essential (primary) hypertension: Secondary | ICD-10-CM | POA: Diagnosis not present

## 2019-03-07 DIAGNOSIS — M1711 Unilateral primary osteoarthritis, right knee: Secondary | ICD-10-CM | POA: Diagnosis not present

## 2019-03-07 DIAGNOSIS — N400 Enlarged prostate without lower urinary tract symptoms: Secondary | ICD-10-CM | POA: Diagnosis not present

## 2019-03-07 DIAGNOSIS — Z86718 Personal history of other venous thrombosis and embolism: Secondary | ICD-10-CM | POA: Diagnosis not present

## 2019-03-07 DIAGNOSIS — Z96652 Presence of left artificial knee joint: Secondary | ICD-10-CM | POA: Diagnosis not present

## 2019-03-07 DIAGNOSIS — Z87891 Personal history of nicotine dependence: Secondary | ICD-10-CM | POA: Diagnosis not present

## 2019-03-07 DIAGNOSIS — Z85818 Personal history of malignant neoplasm of other sites of lip, oral cavity, and pharynx: Secondary | ICD-10-CM | POA: Diagnosis not present

## 2019-03-07 DIAGNOSIS — G20 Parkinson's disease: Secondary | ICD-10-CM | POA: Diagnosis not present

## 2019-03-07 DIAGNOSIS — N200 Calculus of kidney: Secondary | ICD-10-CM | POA: Diagnosis not present

## 2019-03-07 DIAGNOSIS — I714 Abdominal aortic aneurysm, without rupture: Secondary | ICD-10-CM | POA: Diagnosis not present

## 2019-03-07 DIAGNOSIS — Z9181 History of falling: Secondary | ICD-10-CM | POA: Diagnosis not present

## 2019-03-07 DIAGNOSIS — E890 Postprocedural hypothyroidism: Secondary | ICD-10-CM | POA: Diagnosis not present

## 2019-03-07 DIAGNOSIS — Z7901 Long term (current) use of anticoagulants: Secondary | ICD-10-CM | POA: Diagnosis not present

## 2019-03-07 DIAGNOSIS — Z89421 Acquired absence of other right toe(s): Secondary | ICD-10-CM | POA: Diagnosis not present

## 2019-03-08 LAB — URINALYSIS, COMPLETE
Bilirubin, UA: NEGATIVE
Glucose, UA: NEGATIVE
Leukocytes,UA: NEGATIVE
Nitrite, UA: NEGATIVE
Specific Gravity, UA: 1.025 (ref 1.005–1.030)
Urobilinogen, Ur: 1 mg/dL (ref 0.2–1.0)
pH, UA: 6 (ref 5.0–7.5)

## 2019-03-08 LAB — MICROSCOPIC EXAMINATION
Bacteria, UA: NONE SEEN
RBC, Urine: >30 /hpf — AB (ref 0–2)

## 2019-03-10 LAB — CULTURE, URINE COMPREHENSIVE

## 2019-03-13 ENCOUNTER — Other Ambulatory Visit: Payer: Self-pay

## 2019-03-13 ENCOUNTER — Other Ambulatory Visit: Payer: Self-pay | Admitting: Radiology

## 2019-03-13 ENCOUNTER — Encounter: Payer: Self-pay | Admitting: Urology

## 2019-03-13 ENCOUNTER — Ambulatory Visit: Payer: PPO | Admitting: Urology

## 2019-03-13 VITALS — BP 153/77 | HR 66 | Ht 70.0 in | Wt 158.0 lb

## 2019-03-13 DIAGNOSIS — N3289 Other specified disorders of bladder: Secondary | ICD-10-CM | POA: Diagnosis not present

## 2019-03-13 MED ORDER — LIDOCAINE HCL URETHRAL/MUCOSAL 2 % EX GEL
1.0000 "application " | Freq: Once | CUTANEOUS | Status: AC
  Administered 2019-03-13: 1 via URETHRAL

## 2019-03-13 NOTE — H&P (View-Only) (Signed)
Cystoscopy Procedure Note:  Indication: Gross hematuria, 1cm bladder lesion at dome  Briefly, he is a comorbid 86 year old male with history of DVT/PE on Eliquis, Parkinson's disease, nephrolithiasis, and BPH with distant history of TURP who presents with gross hematuria and a 1 cm bladder lesion on CT abdomen pelvis with contrast.  After informed consent and discussion of the procedure and its risks, Walter [name] was positioned and prepped in the standard fashion. Cystoscopy was performed with a flexible cystoscope. The urethra, bladder neck and entire bladder was visualized in a standard fashion. The prostate was moderate in size with a TURP defect.  Urine was very cloudy which limited vision, however there was a 1 cm papillary appearing bladder tumor at the dome.  Imaging: CT abdomen pelvis with contrast dated 11/10/2018 reviewed.  There is a 1 cm nodule at the dome of the bladder concerning for bladder neoplasm.  No evidence of metastatic disease.  Delayed films incomplete.  Findings: 1 cm papillary bladder tumor at dome  Assessment and Plan: In summary, he is a comorbid 86 year old male with a 1 cm papillary bladder tumor at the dome and ongoing intermittent gross hematuria.  We discussed transurethral resection of bladder tumor (TURBT) and risks and benefits at length. This is typically a 1 to 2-hour procedure done under general anesthesia in the operating room.  A scope is inserted through the urethra and used to resect abnormal tissue within the bladder, which is then sent to the pathologist to determine grade and stage of the tumor.  Risks include bleeding, infection, need for temporary Foley placement, and bladder perforation.  Treatment strategies are based on the type of tumor and depth of invasion.  We briefly reviewed the different treatment pathways for non-muscle invasive and muscle invasive bladder cancer.  Schedule TURBT with gemcitabine and bilateral retrograde pyelograms with Dr.  Erlene [name] next week Samples of Myrbetriq given for his OAB symptoms of urgency/frequency and urge incontinence  Walter Madrid, MD 03/13/2019

## 2019-03-13 NOTE — Patient Instructions (Signed)
Transurethral Resection of Bladder Tumor  Transurethral resection of a bladder tumor is the removal (resection) of a cancerous growth (tumor) on the inside wall of the bladder. The bladder is the organ that holds urine. The tumor is removed through the tube that carries urine out of the body (urethra). In a transurethral resection, a thin telescope with a light, a tiny camera, and an electric cutting edge (resectoscope) is passed through the urethra. In men, the opening of the urethra is at the end of the penis. In women, it is just above the opening of the vagina. Tell a health care provider about:  Any allergies you have.  All medicines you are taking, including vitamins, herbs, eye drops, creams, and over-the-counter medicines.  Any problems you or family members have had with anesthetic medicines.  Any blood disorders you have.  Any surgeries you have had.  Any medical conditions you have.  Any recent urinary tract infections you have had.  Whether you are pregnant or may be pregnant. What are the risks? Generally, this is a safe procedure. However, problems may occur, including:  Infection.  Bleeding.  Allergic reactions to medicines.  Damage to nearby structures or organs, such as: ? The urethra. ? The tubes that drain urine from the kidneys into the bladder (ureters).  Pain and burning during urination.  Difficulty urinating due to partial blockage of the urethra.  Inability to urinate (urinary retention). What happens before the procedure? Staying hydrated Follow instructions from your health care provider about hydration, which may include:  Up to 2 hours before the procedure - you may continue to drink clear liquids, such as water, clear fruit juice, black coffee, and plain tea.  Eating and drinking restrictions Follow instructions from your health care provider about eating and drinking, which may include:  8 hours before the procedure - stop eating heavy  meals or foods, such as meat, fried foods, or fatty foods.  6 hours before the procedure - stop eating light meals or foods, such as toast or cereal.  6 hours before the procedure - stop drinking milk or drinks that contain milk.  2 hours before the procedure - stop drinking clear liquids. Medicines Ask your health care provider about:  Changing or stopping your regular medicines. This is especially important if you are taking diabetes medicines or blood thinners.  Taking medicines such as aspirin and ibuprofen. These medicines can thin your blood. Do not take these medicines unless your health care provider tells you to take them.  Taking over-the-counter medicines, vitamins, herbs, and supplements. Tests You may have exams or tests, including:  Physical exam.  Blood tests.  Urine tests.  Electrocardiogram (ECG). This test measures the electrical activity of the heart. General instructions  Plan to have someone take you home from the hospital or clinic.  Ask your health care provider how your surgical site will be marked or identified.  Ask your health care provider what steps will be taken to help prevent infection. These may include: ? Washing skin with a germ-killing soap. ? Taking antibiotic medicine. What happens during the procedure?  An IV will be inserted into one of your veins.  You will be given one or more of the following: ? A medicine to help you relax (sedative). ? A medicine to make you fall asleep (general anesthetic). ? A medicine that is injected into your spine to numb the area below and slightly above the injection site (spinal anesthetic).  Your legs will be   placed in foot rests (stirrups) so that your legs are apart and your knees are bent.  The resectoscope will be passed through your urethra and into your bladder.  The part of your bladder that is affected by the tumor will be resected using the cutting edge of the resectoscope.  The  resectoscope will be removed.  A thin, flexible tube (catheter) will be passed through your urethra and into your bladder. The catheter will drain urine into a bag outside of your body. ? Fluid may be passed through the catheter to keep the catheter open. The procedure may vary among health care providers and hospitals. What happens after the procedure?  Your blood pressure, heart rate, breathing rate, and blood oxygen level will be monitored until you leave the hospital or clinic.  You may continue to receive fluids and medicines through an IV.  You will have some pain. You will be given pain medicine to relieve pain.  You will have a catheter to drain your urine. ? You will have blood in your urine. Your catheter may be kept in until your urine is clear. ? The amount of urine will be monitored. If necessary, your bladder may be rinsed out (irrigated) by passing fluid through your catheter.  You will be encouraged to walk around as soon as possible.  You may have to wear compression stockings. These stockings help to prevent blood clots and reduce swelling in your legs.  Do not drive for 24 hours if you were given a sedative during your procedure. Summary  Transurethral resection of a bladder tumor is the removal (resection) of a cancerous growth (tumor) on the inside wall of the bladder.  To do this procedure, your health care provider uses a thin telescope with a light, a tiny camera, and an electric cutting edge (resectoscope).  Follow your health care provider's instructions. You may need to stop or change certain medicines, and you may be told to stop eating and drinking several hours before the procedure.  Your blood pressure, heart rate, breathing rate, and blood oxygen level will be monitored until you leave the hospital or clinic.  You may have to wear compression stockings. These stockings help to prevent blood clots and reduce swelling in your legs. This information is  not intended to replace advice given to you by your health care provider. Make sure you discuss any questions you have with your health care provider. Document Revised: 09/15/2017 Document Reviewed: 09/15/2017 Elsevier Patient Education  2020 Else[employer]  

## 2019-03-13 NOTE — Progress Notes (Signed)
Cystoscopy Procedure Note:  Indication: Gross hematuria, 1cm bladder lesion at dome  Briefly, he is a comorbid 86 year old male with history of DVT/PE on Eliquis, Parkinson's disease, nephrolithiasis, and BPH with distant history of TURP who presents with gross hematuria and a 1 cm bladder lesion on CT abdomen pelvis with contrast.  After informed consent and discussion of the procedure and its risks, Javionne Ruter was positioned and prepped in the standard fashion. Cystoscopy was performed with a flexible cystoscope. The urethra, bladder neck and entire bladder was visualized in a standard fashion. The prostate was moderate in size with a TURP defect.  Urine was very cloudy which limited vision, however there was a 1 cm papillary appearing bladder tumor at the dome.  Imaging: CT abdomen pelvis with contrast dated 11/10/2018 reviewed.  There is a 1 cm nodule at the dome of the bladder concerning for bladder neoplasm.  No evidence of metastatic disease.  Delayed films incomplete.  Findings: 1 cm papillary bladder tumor at dome  Assessment and Plan: In summary, he is a comorbid 86 year old male with a 1 cm papillary bladder tumor at the dome and ongoing intermittent gross hematuria.  We discussed transurethral resection of bladder tumor (TURBT) and risks and benefits at length. This is typically a 1 to 2-hour procedure done under general anesthesia in the operating room.  A scope is inserted through the urethra and used to resect abnormal tissue within the bladder, which is then sent to the pathologist to determine grade and stage of the tumor.  Risks include bleeding, infection, need for temporary Foley placement, and bladder perforation.  Treatment strategies are based on the type of tumor and depth of invasion.  We briefly reviewed the different treatment pathways for non-muscle invasive and muscle invasive bladder cancer.  Schedule TURBT with gemcitabine and bilateral retrograde pyelograms with Dr.  Erlene Quan next week Samples of Myrbetriq given for his OAB symptoms of urgency/frequency and urge incontinence  [name], MD 03/13/2019

## 2019-03-14 ENCOUNTER — Other Ambulatory Visit
Admission: RE | Admit: 2019-03-14 | Discharge: 2019-03-14 | Disposition: A | Discharge status: Home / Self Care | Payer: PPO | Source: Ambulatory Visit | Attending: Urology | Admitting: Urology

## 2019-03-14 DIAGNOSIS — Z20822 Contact with and (suspected) exposure to covid-19: Secondary | ICD-10-CM | POA: Insufficient documentation

## 2019-03-14 DIAGNOSIS — Z01812 Encounter for preprocedural laboratory examination: Secondary | ICD-10-CM | POA: Insufficient documentation

## 2019-03-14 LAB — URINALYSIS, COMPLETE
Bilirubin, UA: NEGATIVE
Glucose, UA: NEGATIVE
Ketones, UA: NEGATIVE
Nitrite, UA: NEGATIVE
Specific Gravity, UA: 1.025 (ref 1.005–1.030)
Urobilinogen, Ur: 0.2 mg/dL (ref 0.2–1.0)
pH, UA: 6 (ref 5.0–7.5)

## 2019-03-14 LAB — MICROSCOPIC EXAMINATION
Bacteria, UA: NONE SEEN
RBC, Urine: >30 /hpf — AB (ref 0–2)

## 2019-03-14 LAB — SARS CORONAVIRUS 2 (TAT 6-24 HRS): SARS Coronavirus 2: NEGATIVE

## 2019-03-14 NOTE — Telephone Encounter (Signed)
PC with wife  They have considered this and decided to go ahead with the TURBT on Monday as urologist planned. Should be outpatient procedure. Seems reasonable to go ahead now and not wait. Mild increased risk with anaesthesia and Parkinson's--but does need to be done

## 2019-03-15 ENCOUNTER — Encounter: Payer: Self-pay | Admitting: Family Medicine

## 2019-03-15 ENCOUNTER — Ambulatory Visit (INDEPENDENT_AMBULATORY_CARE_PROVIDER_SITE_OTHER): Payer: PPO | Admitting: Family Medicine

## 2019-03-15 ENCOUNTER — Other Ambulatory Visit: Payer: Self-pay

## 2019-03-15 VITALS — BP 140/76 | HR 72 | Temp 98.0°F | Ht 70.0 in | Wt 160.0 lb

## 2019-03-15 DIAGNOSIS — L03114 Cellulitis of left upper limb: Secondary | ICD-10-CM | POA: Diagnosis not present

## 2019-03-15 MED ORDER — CEPHALEXIN 500 MG PO CAPS: 500.0000 mg | ORAL_CAPSULE | Freq: Two times a day (BID) | ORAL | 0 refills | Status: AC

## 2019-03-15 NOTE — Progress Notes (Signed)
Subjective:    Patient ID: Walter [name], male    DOB: 1931/07/05, 86 y.o.   MRN: [id number]  HPI Chief Complaint  Patient presents with  . Other    Patient has a skin tear on left lower arm.  Scratch came from wife's wedding ring   This is an 86 yo male, accompanied by his wife, who presents today with above concern. Occurred 2 weeks ago, has become red over the last 24-48 hours. Has been applying dry bandage after cleaning with sterile water. Has been keeping covered. Some pain today. He is scheduled to have transurethral resection of bladder tumor on 03/18/19.  No fever/chills, streaking, muscle aches.   Past Medical History:  Diagnosis Date  . Abdominal aortic aneurysm (AAA) ([place])   . ARF (acute renal failure) ([place]) 2011   after TKR  . BPH (benign prostatic hypertrophy)   . C. difficile diarrhea   . DVT (deep venous thrombosis) ([place]) 1998   after left knee arthroscopy  . Hx of colonoscopy 2000  . Hypertension   . Hypothyroid 2007   postop from thyroidectomy (cancer scare)  . Kidney stones    recurrent  . Oral cancer ([place]) 2004   graft from left thigh  . Osteoarthritis of both knees   . Parkinson's disease ([place])   . Pulmonary embolism ([place])   . Tendonitis 2012   from quinolone  . Toe amputation status 2010   Past Surgical History:  Procedure Laterality Date  . APPENDECTOMY    . CHOLECYSTECTOMY    . CYSTOSCOPY/URETEROSCOPY/HOLMIUM LASER/STENT PLACEMENT Right 09/13/2017   Procedure: CYSTOSCOPY/URETEROSCOPY/HOLMIUM LASER/STENT PLACEMENT;  Surgeon: Hollice Espy, MD;  Location: [facility] ORS;  Service: Urology;  Laterality: Right;  . HERNIA REPAIR    . INTRAMEDULLARY (IM) NAIL INTERTROCHANTERIC Right 06/13/2017   Procedure: INTRAMEDULLARY (IM) NAIL INTERTROCHANTRIC;  Surgeon: Hessie Knows, MD;  Location: [facility] ORS;  Service: Orthopedics;  Laterality: Right;  . LITHOTRIPSY  2009/2010   then stent and cystoscopic removal 2014  . Oral cancer removed Right 2004  . REPAIR ZENKER'S  DIVERTICULA  2013  . THYROIDECTOMY  2007  . TOE AMPUTATION Right 2010   badly overlapping other toe  . TOTAL KNEE ARTHROPLASTY Left   . TRANSURETHRAL RESECTION OF PROSTATE  2011  . UMBILICAL HERNIA REPAIR  2007   Family History  Problem Relation Age of Onset  . Dementia Mother   . Stroke Father   . Pulmonary disease Brother   . Heart disease Neg Hx   . Diabetes Neg Hx    Social History   Tobacco Use  . Smoking status: Former Smoker    Types: Cigars    Quit date: 04/27/2002    Years since quitting: 16.8  . Smokeless tobacco: Never Used  Substance Use Topics  . Alcohol use: Not Currently    Alcohol/week: 0.0 standard drinks  . Drug use: No      Review of Systems Per HPI    Objective:   Physical Exam Vitals reviewed.  Constitutional:      General: He is not in acute distress.    Appearance: Normal appearance. He is normal weight. He is not ill-appearing, toxic-appearing or diaphoretic.  HENT:     Head: Normocephalic and atraumatic.  Eyes:     Conjunctiva/sclera: Conjunctivae normal.  Cardiovascular:     Rate and Rhythm: Normal rate.  Pulmonary:     Effort: Pulmonary effort is normal.  Skin:    General: Skin is warm and dry.  Findings: Wound present.       Neurological:     Mental Status: He is alert and oriented to person, place, and time.  Psychiatric:        Mood and Affect: Mood normal.        Behavior: Behavior normal.        Thought Content: Thought content normal.        Judgment: Judgment normal.      BP 140/76   Pulse 72   Temp 98 F (36.7 C)   Ht 5\' 10"  (1.778 m)   Wt 160 lb (72.6 kg)   SpO2 97%   BMI 22.96 kg/m  Wt Readings from Last 3 Encounters:  03/15/19 160 lb (72.6 kg)  03/13/19 158 lb (71.7 kg)  03/06/19 175 lb (79.4 kg)        Assessment & Plan:  1. Cellulitis of left upper extremity - dressing removed and area cleaned with sterile water and redressed with Telfa non stick pad and guaze. -Patient and his wife are  instructed to wash daily with mild soap and water, pat dry, apply nonocclusive dressing as needed for sleeping or leaving the house, otherwise encouraged them to keep it open to air -Follow-up precautions reviewed-fever, increased pain, increased redness, swelling or streaking - cephALEXin (KEFLEX) 500 MG capsule; Take 1 capsule (500 mg total) by mouth every 12 (twelve) hours for 7 days.  Dispense: 14 capsule; Refill: 0   Clarene Reamer, FNP-BC  [facility]  03/15/2019 5:01 PM

## 2019-03-15 NOTE — Patient Instructions (Signed)
Good to see you today  I have sent in an antibiotic to treat a skin infection of your arm  Keep clean and dry- use regular soap and water and pat dry gently with clean towel  Leave open to air when you are able, continue to cover at bedtime  If worse, please follow up   Cellulitis, Adult  Cellulitis is a skin infection. The infected area is often warm, red, swollen, and sore. It occurs most often in the arms and lower legs. It is very important to get treated for this condition. What are the causes? This condition is caused by bacteria. The bacteria enter through a break in the skin, such as a cut, burn, insect bite, open sore, or crack. What increases the risk? This condition is more likely to occur in people who:  Have a weak body defense system (immune system).  Have open cuts, burns, bites, or scrapes on the skin.  Are older than 84 years of age.  Have a blood sugar problem (diabetes).  Have a long-lasting (chronic) liver disease (cirrhosis) or kidney disease.  Are very overweight (obese).  Have a skin problem, such as: ? Itchy rash (eczema). ? Slow movement of blood in the veins (venous stasis). ? Fluid buildup below the skin (edema).  Have been treated with high-energy rays (radiation).  Use IV drugs. What are the signs or symptoms? Symptoms of this condition include:  Skin that is: ? Red. ? Streaking. ? Spotting. ? Swollen. ? Sore or painful when you touch it. ? Warm.  A fever.  Chills.  Blisters. How is this diagnosed? This condition is diagnosed based on:  Medical history.  Physical exam.  Blood tests.  Imaging tests. How is this treated? Treatment for this condition may include:  Medicines to treat infections or allergies.  Home care, such as: ? Rest. ? Placing cold or warm cloths (compresses) on the skin.  Hospital care, if the condition is very bad. Follow these instructions at home: Medicines  Take over-the-counter and  prescription medicines only as told by your doctor.  If you were prescribed an antibiotic medicine, take it as told by your doctor. Do not stop taking it even if you start to feel better. General instructions   Drink enough fluid to keep your pee (urine) pale yellow.  Do not touch or rub the infected area.  Raise (elevate) the infected area above the level of your heart while you are sitting or lying down.  Place cold or warm cloths on the area as told by your doctor.  Keep all follow-up visits as told by your doctor. This is important. Contact a doctor if:  You have a fever.  You do not start to get better after 1-2 days of treatment.  Your bone or joint under the infected area starts to hurt after the skin has healed.  Your infection comes back. This can happen in the same area or another area.  You have a swollen bump in the area.  You have new symptoms.  You feel ill and have muscle aches and pains. Get help right away if:  Your symptoms get worse.  You feel very sleepy.  You throw up (vomit) or have watery poop (diarrhea) for a long time.  You see red streaks coming from the area.  Your red area gets larger.  Your red area turns dark in color. These symptoms may represent a serious problem that is an emergency. Do not wait to see if the  symptoms will go away. Get medical help right away. Call your local emergency services (911 in the [place] Do not drive yourself to the hospital. Summary  Cellulitis is a skin infection. The area is often warm, red, swollen, and sore.  This condition is treated with medicines, rest, and cold and warm cloths.  Take all medicines only as told by your doctor.  Tell your doctor if symptoms do not start to get better after 1-2 days of treatment. This information is not intended to replace advice given to you by your health care provider. Make sure you discuss any questions you have with your health care provider. Document Revised:  07/06/2017 Document Reviewed: 07/06/2017 Elsevier Patient Education  [place].

## 2019-03-16 LAB — CULTURE, URINE COMPREHENSIVE

## 2019-03-18 ENCOUNTER — Ambulatory Visit: Payer: PPO | Admitting: Anesthesiology

## 2019-03-18 ENCOUNTER — Other Ambulatory Visit: Payer: Self-pay | Admitting: Urology

## 2019-03-18 ENCOUNTER — Encounter
Admission: RE | Disposition: A | Discharge status: Home / Self Care | Payer: Self-pay | Source: Home / Self Care | Attending: Urology

## 2019-03-18 ENCOUNTER — Ambulatory Visit
Admission: RE | Admit: 2019-03-18 | Discharge: 2019-03-18 | Disposition: A | Discharge status: Home / Self Care | Payer: PPO | Attending: Urology | Admitting: Urology

## 2019-03-18 ENCOUNTER — Ambulatory Visit: Payer: PPO

## 2019-03-18 ENCOUNTER — Encounter: Payer: Self-pay | Admitting: Urology

## 2019-03-18 ENCOUNTER — Other Ambulatory Visit: Payer: Self-pay

## 2019-03-18 DIAGNOSIS — G20 Parkinson's disease: Secondary | ICD-10-CM | POA: Insufficient documentation

## 2019-03-18 DIAGNOSIS — R310 Gross hematuria: Secondary | ICD-10-CM | POA: Insufficient documentation

## 2019-03-18 DIAGNOSIS — Z86718 Personal history of other venous thrombosis and embolism: Secondary | ICD-10-CM | POA: Insufficient documentation

## 2019-03-18 DIAGNOSIS — C671 Malignant neoplasm of dome of bladder: Secondary | ICD-10-CM | POA: Insufficient documentation

## 2019-03-18 DIAGNOSIS — I10 Essential (primary) hypertension: Secondary | ICD-10-CM | POA: Insufficient documentation

## 2019-03-18 DIAGNOSIS — N400 Enlarged prostate without lower urinary tract symptoms: Secondary | ICD-10-CM | POA: Insufficient documentation

## 2019-03-18 DIAGNOSIS — D494 Neoplasm of unspecified behavior of bladder: Secondary | ICD-10-CM

## 2019-03-18 DIAGNOSIS — Z9079 Acquired absence of other genital organ(s): Secondary | ICD-10-CM | POA: Diagnosis not present

## 2019-03-18 DIAGNOSIS — I129 Hypertensive chronic kidney disease with stage 1 through stage 4 chronic kidney disease, or unspecified chronic kidney disease: Secondary | ICD-10-CM | POA: Diagnosis not present

## 2019-03-18 DIAGNOSIS — Z7901 Long term (current) use of anticoagulants: Secondary | ICD-10-CM | POA: Insufficient documentation

## 2019-03-18 DIAGNOSIS — Z87442 Personal history of urinary calculi: Secondary | ICD-10-CM | POA: Insufficient documentation

## 2019-03-18 DIAGNOSIS — N3289 Other specified disorders of bladder: Secondary | ICD-10-CM

## 2019-03-18 DIAGNOSIS — N183 Chronic kidney disease, stage 3 unspecified: Secondary | ICD-10-CM | POA: Diagnosis not present

## 2019-03-18 DIAGNOSIS — Z7689 Persons encountering health services in other specified circumstances: Secondary | ICD-10-CM | POA: Diagnosis not present

## 2019-03-18 DIAGNOSIS — C679 Malignant neoplasm of bladder, unspecified: Secondary | ICD-10-CM | POA: Diagnosis present

## 2019-03-18 DIAGNOSIS — Z87891 Personal history of nicotine dependence: Secondary | ICD-10-CM | POA: Diagnosis not present

## 2019-03-18 DIAGNOSIS — E039 Hypothyroidism, unspecified: Secondary | ICD-10-CM | POA: Diagnosis not present

## 2019-03-18 HISTORY — PX: TRANSURETHRAL RESECTION OF BLADDER TUMOR WITH MITOMYCIN-C: SHX6459

## 2019-03-18 HISTORY — PX: CYSTOSCOPY W/ RETROGRADES: SHX1426

## 2019-03-18 SURGERY — TRANSURETHRAL RESECTION OF BLADDER TUMOR WITH MITOMYCIN-C
Anesthesia: General

## 2019-03-18 MED ORDER — CEFAZOLIN SODIUM-DEXTROSE 2-4 GM/100ML-% IV SOLN
2.0000 g | INTRAVENOUS | Status: AC
  Administered 2019-03-18: 2 g via INTRAVENOUS

## 2019-03-18 MED ORDER — ONDANSETRON HCL 4 MG/2ML IJ SOLN
INTRAMUSCULAR | Status: DC | PRN
  Administered 2019-03-18: 4 mg via INTRAVENOUS

## 2019-03-18 MED ORDER — FENTANYL CITRATE (PF) 100 MCG/2ML IJ SOLN: 25.0000 ug | INTRAMUSCULAR | Status: DC | PRN

## 2019-03-18 MED ORDER — FENTANYL CITRATE (PF) 100 MCG/2ML IJ SOLN
INTRAMUSCULAR | Status: AC
  Filled 2019-03-18: qty 2

## 2019-03-18 MED ORDER — PROPOFOL 10 MG/ML IV BOLUS
INTRAVENOUS | Status: DC | PRN
  Administered 2019-03-18: 60 mg via INTRAVENOUS
  Administered 2019-03-18: 140 mg via INTRAVENOUS

## 2019-03-18 MED ORDER — LIDOCAINE HCL (CARDIAC) PF 100 MG/5ML IV SOSY
PREFILLED_SYRINGE | INTRAVENOUS | Status: DC | PRN
  Administered 2019-03-18: 30 mg via INTRAVENOUS

## 2019-03-18 MED ORDER — FAMOTIDINE 20 MG PO TABS
ORAL_TABLET | ORAL | Status: AC
  Filled 2019-03-18: qty 1

## 2019-03-18 MED ORDER — CEFAZOLIN SODIUM-DEXTROSE 2-4 GM/100ML-% IV SOLN
INTRAVENOUS | Status: AC
  Filled 2019-03-18: qty 100

## 2019-03-18 MED ORDER — FAMOTIDINE 20 MG PO TABS
20.0000 mg | ORAL_TABLET | Freq: Once | ORAL | Status: AC
  Administered 2019-03-18: 20 mg via ORAL

## 2019-03-18 MED ORDER — ONDANSETRON HCL 4 MG/2ML IJ SOLN: 4.0000 mg | Freq: Once | INTRAMUSCULAR | Status: DC | PRN

## 2019-03-18 MED ORDER — IOHEXOL 180 MG/ML  SOLN
INTRAMUSCULAR | Status: DC | PRN
  Administered 2019-03-18: 37 mL

## 2019-03-18 MED ORDER — PROPOFOL 10 MG/ML IV BOLUS
INTRAVENOUS | Status: AC
  Filled 2019-03-18: qty 20

## 2019-03-18 MED ORDER — FENTANYL CITRATE (PF) 100 MCG/2ML IJ SOLN
INTRAMUSCULAR | Status: DC | PRN
  Administered 2019-03-18 (×4): 25 ug via INTRAVENOUS

## 2019-03-18 MED ORDER — LACTATED RINGERS IV SOLN: INTRAVENOUS | Status: DC

## 2019-03-18 MED ORDER — ONDANSETRON HCL 4 MG/2ML IJ SOLN
INTRAMUSCULAR | Status: AC
  Filled 2019-03-18: qty 2

## 2019-03-18 MED ORDER — GEMCITABINE CHEMO FOR BLADDER INSTILLATION 2000 MG
INTRAVENOUS | Status: DC | PRN
  Administered 2019-03-18: 2000 mg via INTRAVESICAL

## 2019-03-18 MED ORDER — LIDOCAINE HCL (PF) 2 % IJ SOLN
INTRAMUSCULAR | Status: AC
  Filled 2019-03-18: qty 5

## 2019-03-18 SURGICAL SUPPLY — 28 items
BAG DRAIN CYSTO-URO LG1000N (MISCELLANEOUS) ×4 IMPLANT
BAG URINE DRAIN 2000ML AR STRL (UROLOGICAL SUPPLIES) ×4 IMPLANT
BRUSH SCRUB EZ  4% CHG (MISCELLANEOUS) ×2
BRUSH SCRUB EZ 4% CHG (MISCELLANEOUS) ×2 IMPLANT
CATH FOLEY 2WAY  5CC 16FR (CATHETERS) ×2
CATH URETL 5X70 OPEN END (CATHETERS) ×4 IMPLANT
CATH URTH 16FR FL 2W BLN LF (CATHETERS) ×2 IMPLANT
CRADLE LAMINECT ARM (MISCELLANEOUS) ×4 IMPLANT
DRAPE UTILITY 15X26 TOWEL STRL (DRAPES) ×4 IMPLANT
DRSG TELFA 4X3 1S NADH ST (GAUZE/BANDAGES/DRESSINGS) ×4 IMPLANT
ELECT LOOP 22F BIPOLAR SML (ELECTROSURGICAL)
ELECT REM PT RETURN 9FT ADLT (ELECTROSURGICAL)
ELECTRODE LOOP 22F BIPOLAR SML (ELECTROSURGICAL) IMPLANT
ELECTRODE REM PT RTRN 9FT ADLT (ELECTROSURGICAL) IMPLANT
GLOVE BIO SURGEON STRL SZ 6.5 (GLOVE) ×3 IMPLANT
GLOVE BIO SURGEONS STRL SZ 6.5 (GLOVE) ×1
GOWN STRL REUS W/ TWL LRG LVL3 (GOWN DISPOSABLE) ×4 IMPLANT
GOWN STRL REUS W/TWL LRG LVL3 (GOWN DISPOSABLE) ×4
KIT TURNOVER CYSTO (KITS) ×4 IMPLANT
LOOP CUT BIPOLAR 24F LRG (ELECTROSURGICAL) IMPLANT
NDL SAFETY ECLIPSE 18X1.5 (NEEDLE) ×2 IMPLANT
NEEDLE HYPO 18GX1.5 SHARP (NEEDLE) ×2
PACK CYSTO AR (MISCELLANEOUS) ×4 IMPLANT
SET IRRIG Y TYPE TUR BLADDER L (SET/KITS/TRAYS/PACK) ×4 IMPLANT
SURGILUBE 2OZ TUBE FLIPTOP (MISCELLANEOUS) ×4 IMPLANT
SYR 10ML LL (SYRINGE) ×4 IMPLANT
SYRINGE IRR TOOMEY STRL 70CC (SYRINGE) ×4 IMPLANT
WATER STERILE IRR 1000ML POUR (IV SOLUTION) ×4 IMPLANT

## 2019-03-18 NOTE — Op Note (Signed)
Date of procedure: 03/18/19  Preoperative diagnosis:  1. Bladder tumor 2. Gross hematuria  Postoperative diagnosis:  1. Same as above  Procedure: 1. TURBT, small 2. Bilateral retrograde pyelogram 3. Instillation of intravesical gemcitabine  Surgeon: Hollice Espy, MD  Anesthesia: General  Complications: None  Intraoperative findings: TURP defect appreciated.  Trabeculated bladder with saccules and small diverticula.  Spherical 1 cm papillary tumor at the dome with 2 satellite lesions, one adjacent and one skip lesion, each of which measuring less than 1 cm.  EBL: Minimal  Specimens: Bladder tumor, down  Drains: 16 French Foley catheter  Indication: Walter [name] is a 86 y.o. patient with gross hematuria found to have a tumor at the dome of the bladder.  After reviewing the management options for treatment, he elected to proceed with the above surgical procedure(s). We have discussed the potential benefits and risks of the procedure, side effects of the proposed treatment, the likelihood of the patient achieving the goals of the procedure, and any potential problems that might occur during the procedure or recuperation. Informed consent has been obtained.  Description of procedure:  The patient was taken to the operating room and general anesthesia was induced.  The patient was placed in the dorsal lithotomy position, prepped and draped in the usual sterile fashion, and preoperative antibiotics were administered. A preoperative time-out was performed.   A 21 French cystoscope was advanced per urethra into the bladder.  Notably, the patient had a large TURP defect and a widely patent bladder neck.  The bladder itself was trabeculated.  At the dome of the bladder, there was a primary spherical approximately 1 cm papillary bladder lesion.  There is some adjacent carpeting which appeared to be in continuity with this lesion.  There is a second satellite lesion approximately 0.5 cm just to  the right of this lesion.  The right of the bladder was clear of any lesions tumors or concerning findings.  First, attention was first turned to the left ureteral orifice was cannulated using a 5 [place] open-ended ureteral catheter.  Gentle retrograde pyelogram was performed on the side which revealed some dilation of the distal ureter without filling defect but otherwise no hydroureteronephrosis or concerning filling defects.  Attention was then turned to the right ureteral orifice which was also cannulated and the same procedure was performed to create a retrograde pyelogram on this side.  This revealed no hydroureteronephrosis or filling defects either.  This kidney had somewhat of a drooping Lilly appearance but no obstruction and drained promptly.  Next, cold cup biopsy forceps were used to piece wise resect both of the tumors as well as adjacent carpeting.  When no obvious residual tumor remained, both electrocautery was used to fulgurate the base of each of these areas.  Adequate hemostasis was performed.  Bladder was then drained.  A 16 French Foley catheter was placed.  The balloon was instilled with 10 cc of sterile water.  The patient was then cleaned and dried repositioned in supine position after being referred from anesthesia and taken the PACU in stable condition.  2000 mg of intravesical gemcitabine was instilled to the bladder in the PACU.  He was able to tolerate this without any difficulty.  After an hour, the bladder was drained and the Foley catheter was removed.  Plan: I was planning on calling the patient with his pathology results however the patient son is concerned the patient when asked appropriate questions or remember.  As such, we will keep his  follow-up visit and make it virtual so that his son to be conferenced and discussed the pathology results.  Hollice Espy, M.D.

## 2019-03-18 NOTE — Progress Notes (Signed)
311ml of yellow urine in foley bag drained and removed

## 2019-03-18 NOTE — Interval H&P Note (Signed)
History and Physical Interval Note:  03/18/2019 1:35 PM  Walter [name]  has presented today for surgery, with the diagnosis of bladder tumor.  The various methods of treatment have been discussed with the patient and family. After consideration of risks, benefits and other options for treatment, the patient has consented to  Procedure(s): TRANSURETHRAL RESECTION OF BLADDER TUMOR WITH Gemcitabine (N/A) as a surgical intervention.  The patient's history has been reviewed, patient examined, no change in status, stable for surgery.  I have reviewed the patient's chart and labs.  Questions were answered to the patient's satisfaction.    RRR CTAB  Urine culture negative  Hollice Espy

## 2019-03-18 NOTE — Transfer of Care (Signed)
Immediate Anesthesia Transfer of Care Note  Patient: Walter [name]  Procedure(s) Performed: TRANSURETHRAL RESECTION OF BLADDER TUMOR WITH Gemcitabine (N/A ) CYSTOSCOPY WITH RETROGRADE PYELOGRAM (Bilateral )  Patient Location: PACU  Anesthesia Type:General  Level of Consciousness: sedated  Airway & Oxygen Therapy: Patient connected to face mask oxygen  Post-op Assessment: Post -op Vital signs reviewed and stable  Post vital signs: stable  Last Vitals:  Vitals Value Taken Time  BP 178/88 03/18/19 1455  Temp    Pulse 64 03/18/19 1456  Resp 11 03/18/19 1457  SpO2 100 % 03/18/19 1456  Vitals shown include unvalidated device data.  Last Pain:  Vitals:   03/18/19 1225  TempSrc: Temporal  PainSc: 0-No pain         Complications: No apparent anesthesia complications

## 2019-03-18 NOTE — Anesthesia Preprocedure Evaluation (Addendum)
Anesthesia Evaluation  Patient identified by MRN, date of birth, ID band Patient awake    Reviewed: Allergy & Precautions, Patient's Chart, lab work & pertinent test results  History of Anesthesia Complications Negative for: history of anesthetic complications  Airway Mallampati: II       Dental  (+) Partial Upper   Pulmonary neg COPD, Not current smoker, former smoker, PE          Cardiovascular hypertension, Pt. on medications (-) Past MI and (-) CHF (-) dysrhythmias (-) Valvular Problems/Murmurs     Neuro/Psych neg Seizures  Neuromuscular disease (Parkinsons dz)    GI/Hepatic Neg liver ROS, neg GERD  ,  Endo/Other  neg diabetesHypothyroidism   Renal/GU Renal InsufficiencyRenal disease     Musculoskeletal   Abdominal   Peds  Hematology   Anesthesia Other Findings   Reproductive/Obstetrics                            Anesthesia Physical Anesthesia Plan  ASA: III  Anesthesia Plan: General   Post-op Pain Management:    Induction: Intravenous  PONV Risk Score and Plan: 2 and Ondansetron and Dexamethasone  Airway Management Planned: LMA  Additional Equipment:   Intra-op Plan:   Post-operative Plan:   Informed Consent: I have reviewed the patients History and Physical, chart, labs and discussed the procedure including the risks, benefits and alternatives for the proposed anesthesia with the patient or authorized representative who has indicated his/her understanding and acceptance.       Plan Discussed with:   Anesthesia Plan Comments:         Anesthesia Quick Evaluation

## 2019-03-18 NOTE — Anesthesia Procedure Notes (Signed)
Procedure Name: LMA Insertion Date/Time: 03/18/2019 2:02 PM Performed by: Aline Brochure, CRNA Pre-anesthesia Checklist: Patient identified, Emergency Drugs available, Suction available and Patient being monitored Patient Re-evaluated:Patient Re-evaluated prior to induction Oxygen Delivery Method: Circle system utilized Preoxygenation: Pre-oxygenation with 100% oxygen Induction Type: IV induction Ventilation: Mask ventilation without difficulty LMA: LMA inserted LMA Size: 5.0 Number of attempts: 2 Placement Confirmation: positive ETCO2 and breath sounds checked- equal and bilateral Tube secured with: Tape Dental Injury: Teeth and Oropharynx as per pre-operative assessment

## 2019-03-18 NOTE — Discharge Instructions (Addendum)
Transurethral Resection of Bladder Tumor (TURBT) or Bladder Biopsy ° ° °Definition: ° Transurethral Resection of the Bladder Tumor is a surgical procedure used to diagnose and remove tumors within the bladder. TURBT is the most common treatment for early stage bladder cancer. ° °General instructions: °   ° Your recent bladder surgery requires very little post hospital care but some definite precautions. ° °Despite the fact that no skin incisions were used, the area around the bladder incisions are raw and covered with scabs to promote healing and prevent bleeding. Certain precautions are needed to insure that the scabs are not disturbed over the next 2-4 weeks while the healing proceeds. ° °Because the raw surface inside your bladder and the irritating effects of urine you may expect frequency of urination and/or urgency (a stronger desire to urinate) and perhaps even getting up at night more often. This will usually resolve or improve slowly over the healing period. You may see some blood in your urine over the first 6 weeks. Do not be alarmed, even if the urine was clear for a while. Get off your feet and drink lots of fluids until clearing occurs. If you start to pass clots or don't improve call us. ° °Diet: ° °You may return to your normal diet immediately. Because of the raw surface of your bladder, alcohol, spicy foods, foods high in acid and drinks with caffeine may cause irritation or frequency and should be used in moderation. To keep your urine flowing freely and avoid constipation, drink plenty of fluids during the day (8-10 glasses). Tip: Avoid cranberry juice because it is very acidic. ° °Activity: ° °Your physical activity doesn't need to be restricted. However, if you are very active, you may see some blood in the urine. We suggest that you reduce your activity under the circumstances until the bleeding has stopped. ° °Bowels: ° °It is important to keep your bowels regular during the postoperative  period. Straining with bowel movements can cause bleeding. A bowel movement every other day is reasonable. Use a mild laxative if needed, such as milk of magnesia 2-3 tablespoons, or 2 Dulcolax tablets. Call if you continue to have problems. If you had been taking narcotics for pain, before, during or after your surgery, you may be constipated. Take a laxative if necessary. ° ° ° °Medication: ° °You should resume your pre-surgery medications unless told not to. In addition you may be given an antibiotic to prevent or treat infection. Antibiotics are not always necessary. All medication should be taken as prescribed until the bottles are finished unless you are having an unusual reaction to one of the drugs. ° ° °[facility] °, [place] [address] °[phone] ° ° ° °AMBULATORY SURGERY  °DISCHARGE INSTRUCTIONS ° ° °1) The drugs that you were given will stay in your system until tomorrow so for the next 24 hours you should not: ° °A) Drive an automobile °B) Make any legal decisions °C) Drink any alcoholic beverage ° ° °2) You may resume regular meals tomorrow.  Today it is better to start with liquids and gradually work up to solid foods. ° °You may eat anything you prefer, but it is better to start with liquids, then soup and crackers, and gradually work up to solid foods. ° ° °3) Please notify your doctor immediately if you have any unusual bleeding, trouble breathing, redness and pain at the surgery site, drainage, fever, or pain not relieved by medication. ° ° ° °4) Additional Instructions: ° ° ° ° ° ° ° °  Please contact your physician with any problems or Same Day Surgery at [phone], Monday through Friday 6 am to 4 pm, or [place] [facility] [place] Main number at [phone]. ° °

## 2019-03-18 NOTE — Progress Notes (Signed)
Pt having trigeminy   Dr Santiago Glad in to see pt and is not concerned with   Pt is very stable

## 2019-03-18 NOTE — OR Nursing (Signed)
Per Dr. Erlene Quan, may restart eliquis tomorrow if urine is not too bloody.   Pt/son aware of same.

## 2019-03-18 NOTE — Anesthesia Postprocedure Evaluation (Signed)
Anesthesia Post Note  Patient: Walter [name]  Procedure(s) Performed: TRANSURETHRAL RESECTION OF BLADDER TUMOR WITH Gemcitabine (N/A ) CYSTOSCOPY WITH RETROGRADE PYELOGRAM (Bilateral )  Patient location during evaluation: PACU Anesthesia Type: General Level of consciousness: awake and alert Pain management: pain level controlled Vital Signs Assessment: post-procedure vital signs reviewed and stable Respiratory status: spontaneous breathing and respiratory function stable Cardiovascular status: stable Anesthetic complications: no     Last Vitals:  Vitals:   03/18/19 1456 03/18/19 1506  BP: (!) 178/88 (!) 175/95  Pulse: 66 69  Resp: 10 (!) 9  Temp: (!) 36.3 C   SpO2: 99% 97%    Last Pain:  Vitals:   03/18/19 1225  TempSrc: Temporal  PainSc: 0-No pain                 KEPHART,WILLIAM K

## 2019-03-20 ENCOUNTER — Telehealth: Payer: Self-pay | Admitting: Urology

## 2019-03-20 LAB — SURGICAL PATHOLOGY

## 2019-03-20 NOTE — Telephone Encounter (Signed)
Left VM regarding appointment will be changed Virtual, asked to call back with any questions.

## 2019-03-20 NOTE — Telephone Encounter (Signed)
Patient's wife returned the call.  Advised that we have changed the appointment to a VIRTUAL VISIT with Dr. Erlene Quan.  I reviewed the instructions with her.

## 2019-03-20 NOTE — Telephone Encounter (Signed)
Pt's wife called and states that they would like for there appt on 03/27/2019 be changed to VIRTUAL. Please advise.

## 2019-03-21 ENCOUNTER — Telehealth: Payer: Self-pay | Admitting: Urology

## 2019-03-21 NOTE — Telephone Encounter (Signed)
Called pt's wife she states that in the last 24 hours she has noticed an increase in hematuria, she has also seen a few clots. Clots are dime sized and very infrequent, they have only seen 3 total. Urine does have some blood but is still clear enough to see through. Pt denied fever, chills, or any pain. Advised wife that some blood is normal and to be expected. Advised wife to call back if pt is unable to void, passes large clots, urine becomes dark like merlot, or fever develops. Wife gave verbal understanding. Will check in with pt again tomorrow.

## 2019-03-21 NOTE — Telephone Encounter (Signed)
Pt's wife called and states that he is now passing blood clots. He had surgery on 03/18/2019. Please advise.

## 2019-03-26 ENCOUNTER — Encounter: Payer: Self-pay | Admitting: Internal Medicine

## 2019-03-26 ENCOUNTER — Other Ambulatory Visit: Payer: Self-pay

## 2019-03-26 ENCOUNTER — Ambulatory Visit (INDEPENDENT_AMBULATORY_CARE_PROVIDER_SITE_OTHER): Payer: PPO | Admitting: Internal Medicine

## 2019-03-26 DIAGNOSIS — E441 Mild protein-calorie malnutrition: Secondary | ICD-10-CM | POA: Diagnosis not present

## 2019-03-26 DIAGNOSIS — G20 Parkinson's disease: Secondary | ICD-10-CM | POA: Diagnosis not present

## 2019-03-26 DIAGNOSIS — C679 Malignant neoplasm of bladder, unspecified: Secondary | ICD-10-CM

## 2019-03-26 DIAGNOSIS — I2692 Saddle embolus of pulmonary artery without acute cor pulmonale: Secondary | ICD-10-CM

## 2019-03-26 HISTORY — DX: Malignant neoplasm of bladder, unspecified: C67.9

## 2019-03-26 MED ORDER — APIXABAN 2.5 MG PO TABS: 2.5000 mg | ORAL_TABLET | Freq: Two times a day (BID) | ORAL | 3 refills | Status: DC

## 2019-03-26 MED ORDER — CARBIDOPA-LEVODOPA 25-100 MG PO TABS: 1.0000 | ORAL_TABLET | Freq: Four times a day (QID) | ORAL | 3 refills | Status: DC

## 2019-03-26 MED ORDER — LEVOTHYROXINE SODIUM 112 MCG PO TABS: 112.0000 ug | ORAL_TABLET | Freq: Every day | ORAL | 3 refills | Status: DC

## 2019-03-26 NOTE — Assessment & Plan Note (Signed)
Just had TURBT High grade by non invasive Seeing urology in follow up tomorrow

## 2019-03-26 NOTE — Progress Notes (Signed)
Subjective:    Patient ID: Walter [name], male    DOB: [birth year], 86 y.o.   MRN: [id number]  HPI Here for follow up of multiple chronic health conditions With wife This visit occurred during the SARS-CoV-2 public health emergency.  Safety protocols were in place, including screening questions prior to the visit, additional usage of staff PPE, and extensive cleaning of exam room while observing appropriate contact time as indicated for disinfecting solutions.   Did have TURBT Tolerated okay Wife noted a lot of blood in urine after procedure Now this has eased off Going back for follow up tomorrow  Eating pureed food  Also drinking boost  Holding his weight  No chest pain No SOB No clear leg swelling or tenderness  Same Parkinson's  Regimen Relatively stable--just hard bouncing back from multiple stressors  Current Outpatient Medications on File Prior to Visit  Medication Sig Dispense Refill  . acetaminophen (TYLENOL) 325 MG tablet Take 2 tablets (650 mg total) by mouth every 6 (six) hours as needed for mild pain or moderate pain (or Fever >/= 101).    Marland Kitchen apixaban (ELIQUIS) 2.5 MG TABS tablet Take 1 tablet (2.5 mg total) by mouth 2 (two) times daily. 180 tablet 3  . carbidopa-levodopa (SINEMET IR) 25-100 MG tablet TAKE 1 TABLET THREE TIMES DAILY (Patient taking differently: Take 1 tablet by mouth 4 (four) times daily. (0800, 1200, 1600, & 2000)) 270 tablet 3  . Cholecalciferol (VITAMIN D) 2000 UNITS CAPS Take 2,000 Units by mouth daily.     . DENTA 5000 PLUS 1.1 % CREA dental cream Place 1 application onto teeth daily.    Marland Kitchen levothyroxine (SYNTHROID) 112 MCG tablet TAKE 1 TABLET EVERY DAY BEFORE BREAKFAST (Patient taking differently: Take 112 mcg by mouth daily before breakfast. ) 90 tablet 3  . vitamin B-12 (CYANOCOBALAMIN) 1000 MCG tablet Take 1,000 mcg by mouth daily.     No current facility-administered medications on file prior to visit.    Allergies  Allergen Reactions  .  Quinolones     tendonitis    Past Medical History:  Diagnosis Date  . Abdominal aortic aneurysm (AAA) ([place])   . ARF (acute renal failure) ([facility]) 2011   after TKR  . BPH (benign prostatic hypertrophy)   . C. difficile diarrhea   . DVT (deep venous thrombosis) ([place]) 1998   after left knee arthroscopy  . Hx of colonoscopy 2000  . Hypertension   . Hypothyroid 2007   postop from thyroidectomy (cancer scare)  . Kidney stones    recurrent  . Oral cancer ([place]) 2004   graft from left thigh  . Osteoarthritis of both knees   . Parkinson's disease ([place])   . Pulmonary embolism ([place])   . Tendonitis 2012   from quinolone  . Toe amputation status 2010    Past Surgical History:  Procedure Laterality Date  . APPENDECTOMY    . CHOLECYSTECTOMY    . CYSTOSCOPY W/ RETROGRADES Bilateral 03/18/2019   Procedure: CYSTOSCOPY WITH RETROGRADE PYELOGRAM;  Surgeon: Hollice Espy, MD;  Location: [facility] ORS;  Service: Urology;  Laterality: Bilateral;  . CYSTOSCOPY/URETEROSCOPY/HOLMIUM LASER/STENT PLACEMENT Right 09/13/2017   Procedure: CYSTOSCOPY/URETEROSCOPY/HOLMIUM LASER/STENT PLACEMENT;  Surgeon: Hollice Espy, MD;  Location: [facility] OR[facility];  Service: Urology;  Laterality: Right;  . HERNIA REPAIR    . INTRAMEDULLARY (IM) NAIL INTERTROCHANTERIC Right 06/13/2017   Procedure: INTRAMEDULLARY (IM) NAIL INTERTROCHANTRIC;  Surgeon: Hessie Knows, MD;  Location: [facility] ORS;  Service: Orthopedics;  Laterality: Right;  . LITHOTRIPSY  2009/2010   then stent and cystoscopic removal 2014  . Oral cancer removed Right 2004  . REPAIR ZENKER'S DIVERTICULA  2013  . THYROIDECTOMY  2007  . TOE AMPUTATION Right 2010   badly overlapping other toe  . TOTAL KNEE ARTHROPLASTY Left   . TRANSURETHRAL RESECTION OF BLADDER TUMOR WITH MITOMYCIN-C N/A 03/18/2019   Procedure: TRANSURETHRAL RESECTION OF BLADDER TUMOR WITH Gemcitabine;  Surgeon: Hollice Espy, MD;  Location: [facility] ORS;  Service: Urology;  Laterality: N/A;  . TRANSURETHRAL  RESECTION OF PROSTATE  2011  . UMBILICAL HERNIA REPAIR  2007    Family History  Problem Relation Age of Onset  . Dementia Mother   . Stroke Father   . Pulmonary disease Brother   . Heart disease Neg Hx   . Diabetes Neg Hx     Social History   Socioeconomic History  . Marital status: Married    Spouse name: Not on file  . Number of children: 3  . Years of education: Not on file  . Highest education level: Not on file  Occupational History  . 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 Smoking status: Former Smoker    Types: Cigars    Quit date: 04/27/2002    Years since quitting: 16.9  . Smokeless tobacco: Never Used  Substance and Sexual Activity  . Alcohol use: Not Currently    Alcohol/week: 0.0 standard drinks  . Drug use: No  . Sexual activity: Not Currently  Other Topics Concern  . Not on file  Social History Narrative   Son works for [employer]   1 daughter in [place], other in [place] living will   Wife, then son, would be health care [place]   Has DNR   No tube feeds if cognitively unaware   Has MOST form done 12/21/18   Social Determinants of Health   Financial Resource Strain:   . Difficulty of Paying Living Expenses: Not on file  Food Insecurity:   . Worried About Charity fundraiser in the Last Year: Not on file  . Ran Out of Food in the Last Year: Not on file  Transportation Needs:   . Lack of Transportation (Medical): Not on file  . Lack of Transportation (Non-Medical): Not on file  Physical Activity:   . Days of Exercise per Week: Not on file  . Minutes of Exercise per Session: Not on file  Stress:   . Feeling of Stress : Not on file  Social Connections:   . Frequency of Communication with Friends and Family: Not on file  . Frequency of Social Gatherings with Friends and Family: Not on file  . Attends Religious Services: Not on file  . Active Member of Clubs or  Organizations: Not on file  . Attends Archivist Meetings: Not on file  . Marital Status: Not on file  Intimate Partner Violence:   . Fear of Current or Ex-Partner: Not on file  . Emotionally Abused: Not on file  . Physically Abused: Not on file  . Sexually Abused: Not on file   Review of Systems Has superficial ulcers on buttocks--now with tegaderm per [facility] RN Sleeps okay---frequent nocturia (but in depends)    Objective:   Physical Exam  Constitutional: No distress.  Neck: No thyromegaly present.  Cardiovascular: Normal rate, regular rhythm and normal heart sounds. Exam reveals no gallop.  No murmur heard. Some  skips  Respiratory: Effort normal and breath sounds normal. No respiratory distress. He has no wheezes. He has no rales.  Lymphadenopathy:    He has no cervical adenopathy.  Neurological:  Same bradykinesia  Skin:  Superficial ulceration right buttocks  Psychiatric: He has a normal mood and affect. His behavior is normal.           Assessment & Plan:

## 2019-03-26 NOTE — Assessment & Plan Note (Signed)
Weight holding now on commercial pureed foods and ensure Will continue regimen

## 2019-03-26 NOTE — Assessment & Plan Note (Signed)
From DVT Not really provoked and high risk--will continue the eliquis

## 2019-03-26 NOTE — Assessment & Plan Note (Signed)
Stable function on the sinemet Needs help with ADLs, etc

## 2019-03-27 ENCOUNTER — Telehealth (INDEPENDENT_AMBULATORY_CARE_PROVIDER_SITE_OTHER): Payer: PPO | Admitting: Urology

## 2019-03-27 DIAGNOSIS — C671 Malignant neoplasm of dome of bladder: Secondary | ICD-10-CM | POA: Diagnosis not present

## 2019-03-27 NOTE — Progress Notes (Signed)
Virtual Visit via Video Note  I connected with Walter [name] on 03/27/19 at 11:30 AM EST by a video enabled telemedicine application and verified that I am speaking with the correct person using two identifiers.  Location: Patient: home, accompanied by wife.  Per patient's request, his son was also 3-way called via video during today's visit. Provider: Office   I discussed the limitations of evaluation and management by telemedicine and the availability of in person appointments. The patient expressed understanding and agreed to proceed.  History of Present Illness: 86 year old male with an episode of gross hematuria found to have bladder tumor at the dome who is now status post TURBT on 03/18/2019 along with bilateral retrograde pyelogram and instillation of intravesical mitomycin.  He reports that surgery was well-tolerated.  He is voiding without difficulty now and no blood.  Surgical pathology consistent with high-grade noninvasive urothelial carcinoma.  He does have multiple medical comorbidities including Parkinson's, history of PE   Observations/Objective: Patient is accompanied today by video chat with his wife.  His wife and son provide most of the history and conversation today.  Assessment and Plan:  1. 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  All visible tumor resected.  Upper tracts unremarkable.  We discussed various management options which include surveillance per AUA guidelines as well as the option for BCG.  We discussed the risk of minutes at this in detail.  Given his age and frailty, they prefer to hold off on BCG at this time but may consider in the future if he has multiple recurrences.  Mr. Trisler and his family have multiple questions, answered in detail today.   Follow Up Instructions: F/u  2.5 months for cystoscopy   I discussed the assessment and treatment plan with the patient. The patient was  provided an opportunity to ask questions and all were answered. The patient agreed with the plan and demonstrated an understanding of the instructions.   The patient was advised to call back or seek an in-person evaluation if the symptoms worsen or if the condition fails to improve as anticipated.  I provided 12 minutes of non-face-to-face time during this encounter.   Hollice Espy, MD

## 2019-04-04 DIAGNOSIS — Z96652 Presence of left artificial knee joint: Secondary | ICD-10-CM | POA: Diagnosis not present

## 2019-04-04 DIAGNOSIS — Z85818 Personal history of malignant neoplasm of other sites of lip, oral cavity, and pharynx: Secondary | ICD-10-CM | POA: Diagnosis not present

## 2019-04-04 DIAGNOSIS — N200 Calculus of kidney: Secondary | ICD-10-CM | POA: Diagnosis not present

## 2019-04-04 DIAGNOSIS — Z89421 Acquired absence of other right toe(s): Secondary | ICD-10-CM | POA: Diagnosis not present

## 2019-04-04 DIAGNOSIS — E890 Postprocedural hypothyroidism: Secondary | ICD-10-CM | POA: Diagnosis not present

## 2019-04-04 DIAGNOSIS — I714 Abdominal aortic aneurysm, without rupture: Secondary | ICD-10-CM | POA: Diagnosis not present

## 2019-04-04 DIAGNOSIS — G20 Parkinson's disease: Secondary | ICD-10-CM | POA: Diagnosis not present

## 2019-04-04 DIAGNOSIS — Z86718 Personal history of other venous thrombosis and embolism: Secondary | ICD-10-CM | POA: Diagnosis not present

## 2019-04-04 DIAGNOSIS — Z7901 Long term (current) use of anticoagulants: Secondary | ICD-10-CM | POA: Diagnosis not present

## 2019-04-04 DIAGNOSIS — Z87891 Personal history of nicotine dependence: Secondary | ICD-10-CM | POA: Diagnosis not present

## 2019-04-04 DIAGNOSIS — M1711 Unilateral primary osteoarthritis, right knee: Secondary | ICD-10-CM | POA: Diagnosis not present

## 2019-04-04 DIAGNOSIS — I10 Essential (primary) hypertension: Secondary | ICD-10-CM | POA: Diagnosis not present

## 2019-04-04 DIAGNOSIS — N400 Enlarged prostate without lower urinary tract symptoms: Secondary | ICD-10-CM | POA: Diagnosis not present

## 2019-04-04 DIAGNOSIS — Z9181 History of falling: Secondary | ICD-10-CM | POA: Diagnosis not present

## 2019-04-17 ENCOUNTER — Inpatient Hospital Stay: Payer: PPO | Attending: Oncology

## 2019-04-17 ENCOUNTER — Other Ambulatory Visit: Payer: Self-pay

## 2019-04-17 ENCOUNTER — Encounter: Payer: Self-pay | Admitting: Oncology

## 2019-04-17 DIAGNOSIS — Z86711 Personal history of pulmonary embolism: Secondary | ICD-10-CM | POA: Insufficient documentation

## 2019-04-17 DIAGNOSIS — G20 Parkinson's disease: Secondary | ICD-10-CM | POA: Diagnosis not present

## 2019-04-17 DIAGNOSIS — I10 Essential (primary) hypertension: Secondary | ICD-10-CM | POA: Diagnosis not present

## 2019-04-17 DIAGNOSIS — Z833 Family history of diabetes mellitus: Secondary | ICD-10-CM | POA: Insufficient documentation

## 2019-04-17 DIAGNOSIS — Z79899 Other long term (current) drug therapy: Secondary | ICD-10-CM | POA: Diagnosis not present

## 2019-04-17 DIAGNOSIS — I714 Abdominal aortic aneurysm, without rupture: Secondary | ICD-10-CM | POA: Diagnosis not present

## 2019-04-17 DIAGNOSIS — Z7901 Long term (current) use of anticoagulants: Secondary | ICD-10-CM | POA: Diagnosis not present

## 2019-04-17 DIAGNOSIS — Z86718 Personal history of other venous thrombosis and embolism: Secondary | ICD-10-CM | POA: Insufficient documentation

## 2019-04-17 DIAGNOSIS — Z8249 Family history of ischemic heart disease and other diseases of the circulatory system: Secondary | ICD-10-CM | POA: Diagnosis not present

## 2019-04-17 DIAGNOSIS — Z87891 Personal history of nicotine dependence: Secondary | ICD-10-CM | POA: Insufficient documentation

## 2019-04-17 DIAGNOSIS — N400 Enlarged prostate without lower urinary tract symptoms: Secondary | ICD-10-CM | POA: Diagnosis not present

## 2019-04-17 DIAGNOSIS — E039 Hypothyroidism, unspecified: Secondary | ICD-10-CM | POA: Diagnosis not present

## 2019-04-17 LAB — CBC WITH DIFFERENTIAL/PLATELET
Abs Immature Granulocytes: 0.05 10*3/uL (ref 0.00–0.07)
Basophils Absolute: 0 10*3/uL (ref 0.0–0.1)
Basophils Relative: 1 %
Eosinophils Absolute: 0.1 10*3/uL (ref 0.0–0.5)
Eosinophils Relative: 1 %
HCT: 41.2 % (ref 39.0–52.0)
Hemoglobin: 12.9 g/dL — ABNORMAL LOW (ref 13.0–17.0)
Immature Granulocytes: 1 %
Lymphocytes Relative: 23 %
Lymphs Abs: 1.2 10*3/uL (ref 0.7–4.0)
MCH: 29.9 pg (ref 26.0–34.0)
MCHC: 31.3 g/dL (ref 30.0–36.0)
MCV: 95.4 fL (ref 80.0–100.0)
Monocytes Absolute: 0.6 10*3/uL (ref 0.1–1.0)
Monocytes Relative: 11 %
Neutro Abs: 3.2 10*3/uL (ref 1.7–7.7)
Neutrophils Relative %: 63 %
Platelets: 225 10*3/uL (ref 150–400)
RBC: 4.32 MIL/uL (ref 4.22–5.81)
RDW: 13.5 % (ref 11.5–15.5)
WBC: 5.1 10*3/uL (ref 4.0–10.5)
nRBC: 0 % (ref 0.0–0.2)

## 2019-04-17 LAB — IRON AND TIBC
Iron: 68 ug/dL (ref 45–182)
Saturation Ratios: 30 % (ref 17.9–39.5)
TIBC: 227 ug/dL — ABNORMAL LOW (ref 250–450)
UIBC: 159 ug/dL

## 2019-04-17 LAB — COMPREHENSIVE METABOLIC PANEL
ALT: 6 U/L (ref 0–44)
AST: 15 U/L (ref 15–41)
Albumin: 4 g/dL (ref 3.5–5.0)
Alkaline Phosphatase: 73 U/L (ref 38–126)
Anion gap: 10 (ref 5–15)
BUN: 28 mg/dL — ABNORMAL HIGH (ref 8–23)
CO2: 28 mmol/L (ref 22–32)
Calcium: 9 mg/dL (ref 8.9–10.3)
Chloride: 104 mmol/L (ref 98–111)
Creatinine, Ser: 1.58 mg/dL — ABNORMAL HIGH (ref 0.61–1.24)
GFR calc Af Amer: 45 mL/min — ABNORMAL LOW (ref >60)
GFR calc non Af Amer: 39 mL/min — ABNORMAL LOW (ref >60)
Glucose, Bld: 109 mg/dL — ABNORMAL HIGH (ref 70–99)
Potassium: 4.1 mmol/L (ref 3.5–5.1)
Sodium: 142 mmol/L (ref 135–145)
Total Bilirubin: 0.9 mg/dL (ref 0.3–1.2)
Total Protein: 7.1 g/dL (ref 6.5–8.1)

## 2019-04-17 LAB — FERRITIN: Ferritin: 243 ng/mL (ref 24–336)

## 2019-04-17 NOTE — Progress Notes (Signed)
Patient would like his lab results and wants to know if he could stop taking his Eliquis since he is needing to do dental work.

## 2019-04-18 ENCOUNTER — Inpatient Hospital Stay: Payer: PPO

## 2019-04-18 ENCOUNTER — Inpatient Hospital Stay (HOSPITAL_BASED_OUTPATIENT_CLINIC_OR_DEPARTMENT_OTHER): Payer: PPO | Admitting: Oncology

## 2019-04-18 DIAGNOSIS — Z7901 Long term (current) use of anticoagulants: Secondary | ICD-10-CM

## 2019-04-18 DIAGNOSIS — I82403 Acute embolism and thrombosis of unspecified deep veins of lower extremity, bilateral: Secondary | ICD-10-CM | POA: Diagnosis not present

## 2019-04-19 DIAGNOSIS — E890 Postprocedural hypothyroidism: Secondary | ICD-10-CM | POA: Diagnosis not present

## 2019-04-19 DIAGNOSIS — N200 Calculus of kidney: Secondary | ICD-10-CM | POA: Diagnosis not present

## 2019-04-19 DIAGNOSIS — N400 Enlarged prostate without lower urinary tract symptoms: Secondary | ICD-10-CM | POA: Diagnosis not present

## 2019-04-19 DIAGNOSIS — Z86718 Personal history of other venous thrombosis and embolism: Secondary | ICD-10-CM | POA: Diagnosis not present

## 2019-04-19 DIAGNOSIS — Z89421 Acquired absence of other right toe(s): Secondary | ICD-10-CM | POA: Diagnosis not present

## 2019-04-19 DIAGNOSIS — I714 Abdominal aortic aneurysm, without rupture: Secondary | ICD-10-CM | POA: Diagnosis not present

## 2019-04-19 DIAGNOSIS — Z96652 Presence of left artificial knee joint: Secondary | ICD-10-CM | POA: Diagnosis not present

## 2019-04-19 DIAGNOSIS — Z85818 Personal history of malignant neoplasm of other sites of lip, oral cavity, and pharynx: Secondary | ICD-10-CM | POA: Diagnosis not present

## 2019-04-19 DIAGNOSIS — M1711 Unilateral primary osteoarthritis, right knee: Secondary | ICD-10-CM | POA: Diagnosis not present

## 2019-04-19 DIAGNOSIS — I10 Essential (primary) hypertension: Secondary | ICD-10-CM | POA: Diagnosis not present

## 2019-04-19 DIAGNOSIS — Z7901 Long term (current) use of anticoagulants: Secondary | ICD-10-CM | POA: Diagnosis not present

## 2019-04-19 DIAGNOSIS — Z9181 History of falling: Secondary | ICD-10-CM | POA: Diagnosis not present

## 2019-04-19 DIAGNOSIS — G20 Parkinson's disease: Secondary | ICD-10-CM | POA: Diagnosis not present

## 2019-04-19 DIAGNOSIS — Z87891 Personal history of nicotine dependence: Secondary | ICD-10-CM | POA: Diagnosis not present

## 2019-04-21 NOTE — Progress Notes (Signed)
I connected with Walter [name] on 04/21/19 at  2:45 PM EST by video enabled telemedicine visit and verified that I am speaking with the correct person using two identifiers.   I discussed the limitations, risks, security and privacy concerns of performing an evaluation and management service by telemedicine and the availability of in-person appointments. I also discussed with the patient that there may be a patient responsible charge related to this service. The patient expressed understanding and agreed to proceed.  Other persons participating in the visit and their role in the encounter:  none  Patient's location:  home Provider's location:  work  Risk analyst Complaint:  H/o recurrent DVT  History of present illness: Patient is a 86 year old gentleman who was seen Dr. Mike Gip previously.  He has a history of left lower extremity DVT 1990 following arthroscopic surgery.  At that time he was on Coumadin for less than a year and then stopped it.  Several years later in January 2018 he developed bilateral PE after a 2-1/2-hour flight.  CTA at that time revealed moderate to large burden of PE in all lobes including a saddle embolus with no evidence of heart strain.  Short segment occlusive DVT was noted in the distal aspect of the right femoral vein.  He had a hypercoagulable work-up at that time which was unremarkable.  Patient has been on Eliquis since then  Interval history: Patient is doing well on eliquis. Denies any complaints at this time. He is a resident of [place] and has received his covid vaccine as well   Review of Systems  Constitutional: Negative for chills, fever, malaise/fatigue and weight loss.  HENT: Negative for congestion, ear discharge and nosebleeds.   Eyes: Negative for blurred vision.  Respiratory: Negative for cough, hemoptysis, sputum production, shortness of breath and wheezing.   Cardiovascular: Negative for chest pain, palpitations, orthopnea and claudication.   Gastrointestinal: Negative for abdominal pain, blood in stool, constipation, diarrhea, heartburn, melena, nausea and vomiting.  Genitourinary: Negative for dysuria, flank pain, frequency, hematuria and urgency.  Musculoskeletal: Negative for back pain, joint pain and myalgias.  Skin: Negative for rash.  Neurological: Negative for dizziness, tingling, focal weakness, seizures, weakness and headaches.  Endo/Heme/Allergies: Does not bruise/bleed easily.  Psychiatric/Behavioral: Negative for depression and suicidal ideas. The patient does not have insomnia.     Allergies  Allergen Reactions  . Quinolones     tendonitis    Past Medical History:  Diagnosis Date  . Abdominal aortic aneurysm (AAA) ([place])   . ARF (acute renal failure) ([place]) 2011   after TKR  . BPH (benign prostatic hypertrophy)   . C. difficile diarrhea   . DVT (deep venous thrombosis) ([place]) 1998   after left knee arthroscopy  . Hx of colonoscopy 2000  . Hypertension   . Hypothyroid 2007   postop from thyroidectomy (cancer scare)  . Kidney stones    recurrent  . Oral cancer ([place]) 2004   graft from left thigh  . Osteoarthritis of both knees   . Parkinson's disease ([place])   . Pulmonary embolism ([place])   . Tendonitis 2012   from quinolone  . Toe amputation status 2010    Past Surgical History:  Procedure Laterality Date  . APPENDECTOMY    . CHOLECYSTECTOMY    . CYSTOSCOPY W/ RETROGRADES Bilateral 03/18/2019   Procedure: CYSTOSCOPY WITH RETROGRADE PYELOGRAM;  Surgeon: Hollice Espy, MD;  Location: [facility] OR[facility];  Service: Urology;  Laterality: Bilateral;  . CYSTOSCOPY/URETEROSCOPY/HOLMIUM LASER/STENT PLACEMENT Right 09/13/2017   Procedure:  CYSTOSCOPY/URETEROSCOPY/HOLMIUM LASER/STENT PLACEMENT;  Surgeon: Hollice Espy, MD;  Location: [facility];  Service: Urology;  Laterality: Right;  . HERNIA REPAIR    . INTRAMEDULLARY (IM) NAIL INTERTROCHANTERIC Right 06/13/2017   Procedure: INTRAMEDULLARY (IM) NAIL INTERTROCHANTRIC;   Surgeon: Hessie Knows, MD;  Location: [facility] OR[facility];  Service: Orthopedics;  Laterality: Right;  . LITHOTRIPSY  2009/2010   then stent and cystoscopic removal 2014  . Oral cancer removed Right 2004  . REPAIR ZENKER'S DIVERTICULA  2013  . THYROIDECTOMY  2007  . TOE AMPUTATION Right 2010   badly overlapping other toe  . TOTAL KNEE ARTHROPLASTY Left   . TRANSURETHRAL RESECTION OF BLADDER TUMOR WITH MITOMYCIN-C N/A 03/18/2019   Procedure: TRANSURETHRAL RESECTION OF BLADDER TUMOR WITH Gemcitabine;  Surgeon: Hollice Espy, MD;  Location: [facility] OR[facility];  Service: Urology;  Laterality: N/A;  . TRANSURETHRAL RESECTION OF PROSTATE  2011  . UMBILICAL HERNIA REPAIR  2007    Social History   Socioeconomic History  . Marital status: Married    Spouse name: Not on file  . Number of children: 3  . Years of education: Not on file  . Highest education level: Not on file  Occupational History  . 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 Smoking status: Former Smoker    Types: Cigars    Quit date: 04/27/2002    Years since quitting: 16.9  . Smokeless tobacco: Never Used  Substance and Sexual Activity  . Alcohol use: Not Currently    Alcohol/week: 0.0 standard drinks  . Drug use: No  . Sexual activity: Not Currently  Other Topics Concern  . Not on file  Social History Narrative   Son works for [employer]   1 daughter in [place], other in [place] living will   Wife, then son, would be health care [place]   Has DNR   No tube feeds if cognitively unaware   Has MOST form done 12/21/18   Social Determinants of Health   Financial Resource Strain:   . Difficulty of Paying Living Expenses: Not on file  Food Insecurity:   . Worried About Charity fundraiser in the Last Year: Not on file  . Ran Out of Food in the Last Year: Not on file  Transportation Needs:   . Lack of Transportation (Medical): Not on file  . Lack of  Transportation (Non-Medical): Not on file  Physical Activity:   . Days of Exercise per Week: Not on file  . Minutes of Exercise per Session: Not on file  Stress:   . Feeling of Stress : Not on file  Social Connections:   . Frequency of Communication with Friends and Family: Not on file  . Frequency of Social Gatherings with Friends and Family: Not on file  . Attends Religious Services: Not on file  . Active Member of Clubs or Organizations: Not on file  . Attends Archivist Meetings: Not on file  . Marital Status: Not on file  Intimate Partner Violence:   . Fear of Current or Ex-Partner: Not on file  . Emotionally Abused: Not on file  . Physically Abused: Not on file  . Sexually Abused: Not on file    Family History  Problem Relation Age of Onset  . Dementia Mother   . Stroke Father   . Pulmonary disease Brother   . Heart disease Neg Hx   . Diabetes Neg Hx  Current Outpatient Medications:  .  acetaminophen (TYLENOL) 325 MG tablet, Take 2 tablets (650 mg total) by mouth every 6 (six) hours as needed for mild pain or moderate pain (or Fever >/= 101)., Disp: , Rfl:  .  apixaban (ELIQUIS) 2.5 MG TABS tablet, Take 1 tablet (2.5 mg total) by mouth 2 (two) times daily., Disp: 180 tablet, Rfl: 3 .  carbidopa-levodopa (SINEMET IR) 25-100 MG tablet, Take 1 tablet by mouth 4 (four) times daily. (0800, 1200, 1600, & 2000), Disp: 360 tablet, Rfl: 3 .  Cholecalciferol (VITAMIN D) 2000 UNITS CAPS, Take 2,000 Units by mouth daily. , Disp: , Rfl:  .  DENTA 5000 PLUS 1.1 % CREA dental cream, Place 1 application onto teeth daily., Disp: , Rfl:  .  levothyroxine (SYNTHROID) 112 MCG tablet, Take 1 tablet (112 mcg total) by mouth daily before breakfast., Disp: 90 tablet, Rfl: 3 .  vitamin B-12 (CYANOCOBALAMIN) 1000 MCG tablet, Take 1,000 mcg by mouth daily., Disp: , Rfl:   No results found.  No images are attached to the encounter.   CMP Latest Ref Rng & Units 04/17/2019  Glucose  70 - 99 mg/dL 109(H)  BUN 8 - 23 mg/dL 28(H)  Creatinine 0.61 - 1.24 mg/dL 1.58(H)  Sodium 135 - 145 mmol/L 142  Potassium 3.5 - 5.1 mmol/L 4.1  Chloride 98 - 111 mmol/L 104  CO2 22 - 32 mmol/L 28  Calcium 8.9 - 10.3 mg/dL 9.0  Total Protein 6.5 - 8.1 g/dL 7.1  Total Bilirubin 0.3 - 1.2 mg/dL 0.9  Alkaline Phos 38 - 126 U/L 73  AST 15 - 41 U/L 15  ALT 0 - 44 U/L 6   CBC Latest Ref Rng & Units 04/17/2019  WBC 4.0 - 10.5 K/uL 5.1  Hemoglobin 13.0 - 17.0 g/dL 12.9(L)  Hematocrit 39.0 - 52.0 % 41.2  Platelets 150 - 400 K/uL 225     Observation/objective:appears in no acute distress over video visit today. Breathing is non labored.  Assessment and plan:Patient is a 86 year old male with h/o recurrent DVT on eliquis and this is a routine f/u visit  Patient will continue eliquis indefinitely unless there are concerns of active bleeding. His cbc/ cmp are stable. Given his age and desire to see fewer physicians. It would be ok for patient to follow up with Dr. Silvio Pate.   Follow-up instructions:no f/u needed  I discussed the assessment and treatment plan with the patient. The patient was provided an opportunity to ask questions and all were answered. The patient agreed with the plan and demonstrated an understanding of the instructions.   The patient was advised to call back or seek an in-person evaluation if the symptoms worsen or if the condition fails to improve as anticipated.   Visit Diagnosis: 1. Recurrent acute deep vein thrombosis (DVT) of both lower extremities (HCC)     Dr. Randa Evens, MD, MPH [facility] Tel- [phone] 04/21/2019 10:46 AM

## 2019-04-22 ENCOUNTER — Telehealth: Payer: Self-pay | Admitting: Internal Medicine

## 2019-04-22 NOTE — Progress Notes (Signed)
  Chronic Care Management   Outreach Note  04/22/2019 Name: Walter [name] MRN: [id number] DOB: [birth year]  Referred by: Venia Carbon, MD Reason for referral : No chief complaint on file.   An unsuccessful telephone outreach was attempted today. The patient was referred to the pharmacist for assistance with care management and care coordination.   Follow Up Plan:   Raynicia Dukes [employer] Scheduler

## 2019-04-30 ENCOUNTER — Ambulatory Visit: Payer: Medicare HMO | Admitting: Oncology

## 2019-04-30 ENCOUNTER — Other Ambulatory Visit: Payer: Medicare HMO

## 2019-05-01 DIAGNOSIS — N200 Calculus of kidney: Secondary | ICD-10-CM | POA: Diagnosis not present

## 2019-05-01 DIAGNOSIS — Z86718 Personal history of other venous thrombosis and embolism: Secondary | ICD-10-CM | POA: Diagnosis not present

## 2019-05-01 DIAGNOSIS — I10 Essential (primary) hypertension: Secondary | ICD-10-CM | POA: Diagnosis not present

## 2019-05-01 DIAGNOSIS — Z96652 Presence of left artificial knee joint: Secondary | ICD-10-CM | POA: Diagnosis not present

## 2019-05-01 DIAGNOSIS — Z9181 History of falling: Secondary | ICD-10-CM | POA: Diagnosis not present

## 2019-05-01 DIAGNOSIS — N400 Enlarged prostate without lower urinary tract symptoms: Secondary | ICD-10-CM | POA: Diagnosis not present

## 2019-05-01 DIAGNOSIS — M1711 Unilateral primary osteoarthritis, right knee: Secondary | ICD-10-CM | POA: Diagnosis not present

## 2019-05-01 DIAGNOSIS — I714 Abdominal aortic aneurysm, without rupture: Secondary | ICD-10-CM | POA: Diagnosis not present

## 2019-05-01 DIAGNOSIS — E890 Postprocedural hypothyroidism: Secondary | ICD-10-CM | POA: Diagnosis not present

## 2019-05-01 DIAGNOSIS — Z89421 Acquired absence of other right toe(s): Secondary | ICD-10-CM | POA: Diagnosis not present

## 2019-05-01 DIAGNOSIS — Z87891 Personal history of nicotine dependence: Secondary | ICD-10-CM | POA: Diagnosis not present

## 2019-05-01 DIAGNOSIS — Z7901 Long term (current) use of anticoagulants: Secondary | ICD-10-CM | POA: Diagnosis not present

## 2019-05-01 DIAGNOSIS — Z85818 Personal history of malignant neoplasm of other sites of lip, oral cavity, and pharynx: Secondary | ICD-10-CM | POA: Diagnosis not present

## 2019-05-01 DIAGNOSIS — G20 Parkinson's disease: Secondary | ICD-10-CM | POA: Diagnosis not present

## 2019-05-02 ENCOUNTER — Other Ambulatory Visit: Payer: Self-pay

## 2019-05-02 ENCOUNTER — Non-Acute Institutional Stay: Payer: PPO | Admitting: Adult Health Nurse Practitioner

## 2019-05-02 DIAGNOSIS — G20 Parkinson's disease: Secondary | ICD-10-CM

## 2019-05-02 DIAGNOSIS — Z515 Encounter for palliative care: Secondary | ICD-10-CM | POA: Diagnosis not present

## 2019-05-02 NOTE — Progress Notes (Signed)
[facility] Consult Note Telephone: [phone]  Fax: [phone]  PATIENT NAME: Walter [name] DOB: 02-23-1932 MRN: [id number]  PRIMARY CARE PROVIDER:   Venia Carbon, MD  REFERRING PROVIDER:  Venia Carbon, MD [place],  [place] [address]  RESPONSIBLE PARTY:    Wife, Walter [name] [phone]    RECOMMENDATIONS and PLAN:  1.  Advanced care planning. Patient is a DNR  2.  Parkinson's disease.  Patient has had recent increase of Sinemet 25/100mg  from TID to QID.  Denies any increased tremors or freezing episodes.  No reported falls.  Is able to walk further with walker.  Wants to walk without his walker.  Discussed that with the progression of Parkinson's that he would not be able to walk without his walker for safety.  Continue follow up and recommendations by neurology.  3.  Hematuria.  When evaluated for the hematuria in January patient was found to have bladder cancer and underwent TURBT.  He has done well with this.  Had bleeding at first but that cleared after a couple of days. The anesthesia did knock him out for a couple of days but he appears to be back at baseline.  Next follow up visit with urology is in April.  Continue follow up and recommendations by urology.    4. Support. Wife states that she is having an aide that comes in M,W,Th, F for a couple of hours to help him get cleaned up and dressed.  States that she would like to see if she could them to come and help on Sundays as well.  Encouraged to get the help she needs as this helps the both of them.  Denies falls, appetite good with no weight loss.  Denies SOB, cough, fever, N/V/D, constipation. Palliative will continue to monitor for symptom management/decline and make recommendations as needed.  Next appointment is in 8 weeks  I spent 60 minutes providing this consultation,  from 11:30 to 12:30. More than 50% of the time in this consultation was spent  coordinating communication.   HISTORY OF PRESENT ILLNESS:  Walter [name] is a 86 y.o. year old male with multiple medical problems including Parkinson's disease, HTN, BPH, h/o oral cancer, hypothyroidism. Palliative Care was asked to help address goals of care.   CODE STATUS: DNR  PPS: 50% HOSPICE ELIGIBILITY/DIAGNOSIS: TBD  PHYSICAL EXAM:  BP 122/62  HR 50  O2 96% on RA General: NAD, frail appearing, thin Cardiovascular: regular rate and rhythm Pulmonary: lung sounds clear; normal respiratory effort Abdomen: soft, nontender, + bowel sounds GU: no suprapubic tenderness; no CVA tenderness Extremities: trace edema of feet and ankles, no joint deformities Neurological: Weakness but otherwise nonfocal   PAST MEDICAL HISTORY:  Past Medical History:  Diagnosis Date  . Abdominal aortic aneurysm (AAA) ([place])   . ARF (acute renal failure) ([place]) 2011   after TKR  . BPH (benign prostatic hypertrophy)   . C. difficile diarrhea   . DVT (deep venous thrombosis) ([place]) 1998   after left knee arthroscopy  . Hx of colonoscopy 2000  . Hypertension   . Hypothyroid 2007   postop from thyroidectomy (cancer scare)  . Kidney stones    recurrent  . Oral cancer ([place]) 2004   graft from left thigh  . Osteoarthritis of both knees   . Parkinson's disease ([place])   . Pulmonary embolism ([place])   . Tendonitis 2012   from quinolone  . Toe amputation  status 2010    SOCIAL HX:  Social History   Tobacco Use  . Smoking status: Former Smoker    Types: Cigars    Quit date: 04/27/2002    Years since quitting: 17.0  . Smokeless tobacco: Never Used  Substance Use Topics  . Alcohol use: Not Currently    Alcohol/week: 0.0 standard drinks    ALLERGIES:  Allergies  Allergen Reactions  . Quinolones     tendonitis     PERTINENT MEDICATIONS:  Outpatient Encounter Medications as of 05/02/2019  Medication Sig  . acetaminophen (TYLENOL) 325 MG tablet Take 2 tablets (650 mg total) by mouth every 6 (six) hours  as needed for mild pain or moderate pain (or Fever >/= 101).  Marland Kitchen apixaban (ELIQUIS) 2.5 MG TABS tablet Take 1 tablet (2.5 mg total) by mouth 2 (two) times daily.  . carbidopa-levodopa (SINEMET IR) 25-100 MG tablet Take 1 tablet by mouth 4 (four) times daily. (0800, 1200, 1600, & 2000)  . Cholecalciferol (VITAMIN D) 2000 UNITS CAPS Take 2,000 Units by mouth daily.   . DENTA 5000 PLUS 1.1 % CREA dental cream Place 1 application onto teeth daily.  Marland Kitchen levothyroxine (SYNTHROID) 112 MCG tablet Take 1 tablet (112 mcg total) by mouth daily before breakfast.  . vitamin B-12 (CYANOCOBALAMIN) 1000 MCG tablet Take 1,000 mcg by mouth daily.   No facility-administered encounter medications on file as of 05/02/2019.     Lenny Bouchillon Jenetta Downer, NP

## 2019-05-03 ENCOUNTER — Ambulatory Visit: Payer: Medicare HMO | Admitting: Oncology

## 2019-05-03 ENCOUNTER — Other Ambulatory Visit: Payer: Medicare HMO

## 2019-05-03 DIAGNOSIS — N200 Calculus of kidney: Secondary | ICD-10-CM | POA: Diagnosis not present

## 2019-05-03 DIAGNOSIS — Z7901 Long term (current) use of anticoagulants: Secondary | ICD-10-CM

## 2019-05-03 DIAGNOSIS — Z9181 History of falling: Secondary | ICD-10-CM

## 2019-05-03 DIAGNOSIS — Z89421 Acquired absence of other right toe(s): Secondary | ICD-10-CM | POA: Diagnosis not present

## 2019-05-03 DIAGNOSIS — Z86718 Personal history of other venous thrombosis and embolism: Secondary | ICD-10-CM | POA: Diagnosis not present

## 2019-05-03 DIAGNOSIS — Z87891 Personal history of nicotine dependence: Secondary | ICD-10-CM | POA: Diagnosis not present

## 2019-05-03 DIAGNOSIS — N400 Enlarged prostate without lower urinary tract symptoms: Secondary | ICD-10-CM | POA: Diagnosis not present

## 2019-05-03 DIAGNOSIS — E890 Postprocedural hypothyroidism: Secondary | ICD-10-CM | POA: Diagnosis not present

## 2019-05-03 DIAGNOSIS — I714 Abdominal aortic aneurysm, without rupture: Secondary | ICD-10-CM | POA: Diagnosis not present

## 2019-05-03 DIAGNOSIS — Z85818 Personal history of malignant neoplasm of other sites of lip, oral cavity, and pharynx: Secondary | ICD-10-CM | POA: Diagnosis not present

## 2019-05-03 DIAGNOSIS — Z96652 Presence of left artificial knee joint: Secondary | ICD-10-CM | POA: Diagnosis not present

## 2019-05-03 DIAGNOSIS — G20 Parkinson's disease: Secondary | ICD-10-CM | POA: Diagnosis not present

## 2019-05-03 DIAGNOSIS — M1711 Unilateral primary osteoarthritis, right knee: Secondary | ICD-10-CM | POA: Diagnosis not present

## 2019-05-03 DIAGNOSIS — I10 Essential (primary) hypertension: Secondary | ICD-10-CM | POA: Diagnosis not present

## 2019-06-12 ENCOUNTER — Other Ambulatory Visit: Payer: PPO | Admitting: Urology

## 2019-06-12 ENCOUNTER — Encounter: Payer: Self-pay | Admitting: Urology

## 2019-06-12 ENCOUNTER — Other Ambulatory Visit: Payer: Self-pay

## 2019-06-12 ENCOUNTER — Ambulatory Visit: Payer: PPO | Admitting: Urology

## 2019-06-12 VITALS — BP 118/72 | HR 68 | Ht 70.0 in

## 2019-06-12 DIAGNOSIS — N3289 Other specified disorders of bladder: Secondary | ICD-10-CM

## 2019-06-12 LAB — URINALYSIS, COMPLETE
Bilirubin, UA: NEGATIVE
Glucose, UA: NEGATIVE
Leukocytes,UA: NEGATIVE
Nitrite, UA: NEGATIVE
Specific Gravity, UA: 1.02 (ref 1.005–1.030)
Urobilinogen, Ur: 0.2 mg/dL (ref 0.2–1.0)
pH, UA: 6 (ref 5.0–7.5)

## 2019-06-12 LAB — MICROSCOPIC EXAMINATION

## 2019-06-12 NOTE — Progress Notes (Signed)
   06/12/19  CC:  Chief Complaint  Patient presents with  . Cysto    HPI: Walter [name] is a 86 y.o. M with an episode of gross hematuria found to have bladder tumor at the dome who is now status post TURBT on 03/18/2019 along with bilateral retrograde pyelogram and instillation of intravesical mitomycin returns today for f/u.   Surgical pathology consistent with high-grade noninvasive urothelial carcinoma. Upper tracts unremarkable.   He does have multiple medical comorbidities including Parkinson's and history of PE.  No complaints today. Denies gross hematuria.   Accompanied by son today   Vitals:   06/12/19 1423  BP: 118/72  Pulse: 29   NED. A&Ox3.   No respiratory distress   Abd soft, NT, ND Normal phallus with bilateral descended testicles  Cystoscopy Procedure Note  Patient identification was confirmed, informed consent was obtained, and patient was prepped using Betadine solution.  Lidocaine jelly was administered per urethral meatus.     Pre-Procedure: - Inspection reveals a normal caliber ureteral meatus.  Procedure: The flexible cystoscope was introduced without difficulty - No urethral strictures/lesions are present. - Normal prostate  - TURP defect appreicated - Open bladder neck - Bilateral ureteral orifices identified - Bladder mucosa  reveals no ulcers, tumors, or lesions - No bladder stones - Trabeculated bladder with small saccules at dome   Retroflexion shows no tumors.   Post-Procedure: - Patient tolerated the procedure well  Assessment/ Plan:  1. History of malignant neoplasm of dome of urinary bladder  Status post TURBT on 03/18/2019 Low risk non invasive bladder cancer Cysto today indicated no tumors  Return in 6 months for cysto   Return in about 6 months (around 12/12/2019) for cysto.  Jamas Lav, am acting as a scribe for Dr. Hollice Espy,  I have reviewed the above documentation for accuracy and completeness, and I  agree with the above.   Hollice Espy, MD

## 2019-06-13 ENCOUNTER — Telehealth: Payer: Self-pay | Admitting: Adult Health Nurse Practitioner

## 2019-06-13 NOTE — Telephone Encounter (Signed)
Returning wife's call about billing statement.  Left VM that I would have our office staff give her call about it.  Talked with palliative admin staff who will call her to what is going on and get it straightened out. Ulla Mckiernan K. Olena Heckle NP

## 2019-06-17 ENCOUNTER — Ambulatory Visit (INDEPENDENT_AMBULATORY_CARE_PROVIDER_SITE_OTHER): Payer: PPO | Admitting: Internal Medicine

## 2019-06-17 ENCOUNTER — Other Ambulatory Visit: Payer: Self-pay

## 2019-06-17 ENCOUNTER — Encounter: Payer: Self-pay | Admitting: Internal Medicine

## 2019-06-17 VITALS — BP 118/74 | HR 71 | Temp 97.1°F | Ht 70.0 in | Wt 165.0 lb

## 2019-06-17 DIAGNOSIS — I714 Abdominal aortic aneurysm, without rupture, unspecified: Secondary | ICD-10-CM

## 2019-06-17 DIAGNOSIS — G20 Parkinson's disease: Secondary | ICD-10-CM | POA: Diagnosis not present

## 2019-06-17 DIAGNOSIS — Z89421 Acquired absence of other right toe(s): Secondary | ICD-10-CM | POA: Diagnosis not present

## 2019-06-17 DIAGNOSIS — Z7189 Other specified counseling: Secondary | ICD-10-CM | POA: Diagnosis not present

## 2019-06-17 DIAGNOSIS — N1832 Chronic kidney disease, stage 3b: Secondary | ICD-10-CM | POA: Diagnosis not present

## 2019-06-17 DIAGNOSIS — Z0000 Encounter for general adult medical examination without abnormal findings: Secondary | ICD-10-CM | POA: Diagnosis not present

## 2019-06-17 DIAGNOSIS — I2692 Saddle embolus of pulmonary artery without acute cor pulmonale: Secondary | ICD-10-CM | POA: Diagnosis not present

## 2019-06-17 NOTE — Progress Notes (Signed)
Subjective:    Patient ID: Walter [name], male    DOB: December 26, 1931, 86 y.o.   MRN: [id number]  HPI Here for Medicare wellness visit and follow up of chronic health conditions This visit occurred during the SARS-CoV-2 public health emergency.  Safety protocols were in place, including screening questions prior to the visit, additional usage of staff PPE, and extensive cleaning of exam room while observing appropriate contact time as indicated for disinfecting solutions.   Reviewed form and advanced directives Reviewed other doctors No alcohol or tobacco Tries to walk most days Did have fall with injury No regular depressed mood. "I am so spoiled" by wife and aides Aides 4 days per week usually Needs recheck on eyes---harder to read newspaper Has hearing aides Mild memory problems--doesn't seem to have progressed  Still eating the pureed foods Weight is holding Has gained back 20# since his nadir  Parkinson's is about the same Walking with walker Doesn't drive Assist with dressing and bathing Does void without help--but needs help for cleaning after bowels  No leg swelling or pain Continues on eliquis for the DVT/PE No chest pain No SOB No palpitations  Known aortic atherosclerosis seen on CT scan Abdominal aorta ectasia also. Borderline for aneurysm on 7/19 CT scan  Last GFR 39  Current Outpatient Medications on File Prior to Visit  Medication Sig Dispense Refill  . acetaminophen (TYLENOL) 325 MG tablet Take 2 tablets (650 mg total) by mouth every 6 (six) hours as needed for mild pain or moderate pain (or Fever >/= 101).    Marland Kitchen apixaban (ELIQUIS) 2.5 MG TABS tablet Take 1 tablet (2.5 mg total) by mouth 2 (two) times daily. 180 tablet 3  . carbidopa-levodopa (SINEMET IR) 25-100 MG tablet Take 1 tablet by mouth 4 (four) times daily. (0800, 1200, 1600, & 2000) 360 tablet 3  . Cholecalciferol (VITAMIN D) 2000 UNITS CAPS Take 2,000 Units by mouth daily.     . DENTA 5000 PLUS 1.1 %  CREA dental cream Place 1 application onto teeth daily.    Marland Kitchen levothyroxine (SYNTHROID) 112 MCG tablet Take 1 tablet (112 mcg total) by mouth daily before breakfast. 90 tablet 3  . vitamin B-12 (CYANOCOBALAMIN) 1000 MCG tablet Take 1,000 mcg by mouth daily.     No current facility-administered medications on file prior to visit.    Allergies  Allergen Reactions  . Quinolones     tendonitis    Past Medical History:  Diagnosis Date  . Abdominal aortic aneurysm (AAA) ([place])   . ARF (acute renal failure) ([place]) 2011   after TKR  . BPH (benign prostatic hypertrophy)   . C. difficile diarrhea   . DVT (deep venous thrombosis) ([place]) 1998   after left knee arthroscopy  . Hx of colonoscopy 2000  . Hypertension   . Hypothyroid 2007   postop from thyroidectomy (cancer scare)  . Kidney stones    recurrent  . Oral cancer ([place]) 2004   graft from left thigh  . Osteoarthritis of both knees   . Parkinson's disease ([place])   . Pulmonary embolism ([place])   . Tendonitis 2012   from quinolone  . Toe amputation status 2010    Past Surgical History:  Procedure Laterality Date  . APPENDECTOMY    . CHOLECYSTECTOMY    . CYSTOSCOPY W/ RETROGRADES Bilateral 03/18/2019   Procedure: CYSTOSCOPY WITH RETROGRADE PYELOGRAM;  Surgeon: Hollice Espy, MD;  Location: [facility] ORS;  Service: Urology;  Laterality: Bilateral;  . CYSTOSCOPY/URETEROSCOPY/HOLMIUM LASER/STENT PLACEMENT Right  09/13/2017   Procedure: CYSTOSCOPY/URETEROSCOPY/HOLMIUM LASER/STENT PLACEMENT;  Surgeon: Hollice Espy, MD;  Location: [facility];  Service: Urology;  Laterality: Right;  . HERNIA REPAIR    . INTRAMEDULLARY (IM) NAIL INTERTROCHANTERIC Right 06/13/2017   Procedure: INTRAMEDULLARY (IM) NAIL INTERTROCHANTRIC;  Surgeon: Hessie Knows, MD;  Location: [facility];  Service: Orthopedics;  Laterality: Right;  . LITHOTRIPSY  2009/2010   then stent and cystoscopic removal 2014  . Oral cancer removed Right 2004  . REPAIR ZENKER'S DIVERTICULA  2013    . THYROIDECTOMY  2007  . TOE AMPUTATION Right 2010   badly overlapping other toe  . TOTAL KNEE ARTHROPLASTY Left   . TRANSURETHRAL RESECTION OF BLADDER TUMOR WITH MITOMYCIN-C N/A 03/18/2019   Procedure: TRANSURETHRAL RESECTION OF BLADDER TUMOR WITH Gemcitabine;  Surgeon: Hollice Espy, MD;  Location: [facility];  Service: Urology;  Laterality: N/A;  . TRANSURETHRAL RESECTION OF PROSTATE  2011  . UMBILICAL HERNIA REPAIR  2007    Family History  Problem Relation Age of Onset  . Dementia Mother   . Stroke Father   . Pulmonary disease Brother   . Heart disease Neg Hx   . Diabetes Neg Hx     Social History   Socioeconomic History  . Marital status: Married    Spouse name: Not on file  . Number of children: 3  . Years of education: Not on file  . Highest education level: Not on file  Occupational History  . 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 Smoking status: Former Smoker    Types: Cigars    Quit date: 04/27/2002    Years since quitting: 17.1  . Smokeless tobacco: Never Used  Substance and Sexual Activity  . Alcohol use: Not Currently    Alcohol/week: 0.0 standard drinks  . Drug use: No  . Sexual activity: Not Currently  Other Topics Concern  . Not on file  Social History Narrative   Son works for [employer]   1 daughter in [place], other in [place] living will   Wife, then son, would be health care [place]   Has DNR   No tube feeds if cognitively unaware   Has MOST form done 12/21/18   Social Determinants of Health   Financial Resource Strain:   . Difficulty of Paying Living Expenses:   Food Insecurity:   . Worried About Charity fundraiser in the Last Year:   . Arboriculturist in the Last Year:   Transportation Needs:   . Film/video editor (Medical):   Marland Kitchen Lack of Transportation (Non-Medical):   Physical Activity:   . Days of Exercise per Week:   . Minutes of Exercise per  Session:   Stress:   . Feeling of Stress :   Social Connections:   . Frequency of Communication with Friends and Family:   . Frequency of Social Gatherings with Friends and Family:   . Attends Religious Services:   . Active Member of Clubs or Organizations:   . Attends Archivist Meetings:   Marland Kitchen Marital Status:   Intimate Partner Violence:   . Fear of Current or Ex-Partner:   . Emotionally Abused:   Marland Kitchen Physically Abused:   . Sexually Abused:    Review of Systems Appetite is better Sleeps "like a rock" Had follow up cystoscopy--no tumors Nocturia x 2. Uses pad and depends (due to night incontinence). No daytime incontinence  No sig back or joint pains No heartburn. Will still choke when eating if he is not careful  Bowels are fine. No blood No rash or suspicious skin lesions Has some troubles with teeth---will be getting "serious work" done    Objective:   Physical Exam  Constitutional: He is oriented to person, place, and time. No distress.  Neck: No thyromegaly present.  Cardiovascular: Normal rate and regular rhythm. Exam reveals no gallop.  No murmur heard. Faint pedal pulses  Respiratory: Effort normal and breath sounds normal. No respiratory distress. He has no wheezes. He has no rales.  GI: Soft. There is no abdominal tenderness.  Musculoskeletal:        General: No edema.     Comments: Right second toe missing  Lymphadenopathy:    He has no cervical adenopathy.  Neurological: He is alert and oriented to person, place, and time.  President--- "Zoila Shutter, Obama" [phone] [id number] Recall 3/3  Mild tremor with movement Mild to moderate bradykinesia Fairly normal tone  Skin: No rash noted. No erythema.  Psychiatric: He has a normal mood and affect. His behavior is normal.           Assessment & Plan:

## 2019-06-17 NOTE — Assessment & Plan Note (Signed)
I have personally reviewed the Medicare Annual Wellness questionnaire and have noted 1. The patient's medical and social history 2. Their use of alcohol, tobacco or illicit drugs 3. Their current medications and supplements 4. The patient's functional ability including ADL's, fall risks, home safety risks and hearing or visual             impairment. 5. Diet and physical activities 6. Evidence for depression or mood disorders  The patients weight, height, BMI and visual acuity have been recorded in the chart I have made referrals, counseling and provided education to the patient based review of the above and I have provided the pt with a written personalized care plan for preventive services.  I have provided you with a copy of your personalized plan for preventive services. Please take the time to review along with your updated medication list.  Flu vaccine in the fall Had COVID vaccines No cancer screening due to age 27 to walk

## 2019-06-17 NOTE — Assessment & Plan Note (Signed)
Had unprovoked DVT and PE Will continue the eliquis

## 2019-06-17 NOTE — Assessment & Plan Note (Signed)
Limits him Needs help with ADLs but relatively stable functional status of late

## 2019-06-17 NOTE — Assessment & Plan Note (Signed)
Small I doubt this will be an issue within his lifetime May need recheck in 1-2 years

## 2019-06-17 NOTE — Assessment & Plan Note (Signed)
Has DNR MOST due for renewal in October

## 2019-06-17 NOTE — Progress Notes (Signed)
Hearing Screening   125Hz  250Hz  500Hz  1000Hz  2000Hz  3000Hz  4000Hz  6000Hz  8000Hz   Right ear:           Left ear:           Comments: Has hearing aids. Not wearing them today.  Vision Screening Comments: May 2020

## 2019-06-17 NOTE — Assessment & Plan Note (Signed)
GFR 39---would not start meds with risk of hypotension from Parkinson's

## 2019-06-17 NOTE — Assessment & Plan Note (Signed)
Right second toe removed about 15 years ago Site looks fine Doesn't affect him

## 2019-06-27 ENCOUNTER — Ambulatory Visit: Payer: Medicare HMO | Admitting: Neurology

## 2019-07-04 ENCOUNTER — Non-Acute Institutional Stay: Payer: PPO | Admitting: Adult Health Nurse Practitioner

## 2019-07-04 ENCOUNTER — Other Ambulatory Visit: Payer: Self-pay

## 2019-07-04 DIAGNOSIS — Z515 Encounter for palliative care: Secondary | ICD-10-CM | POA: Diagnosis not present

## 2019-07-04 DIAGNOSIS — G20 Parkinson's disease: Secondary | ICD-10-CM | POA: Diagnosis not present

## 2019-07-04 DIAGNOSIS — G20A1 Parkinson's disease without dyskinesia, without mention of fluctuations: Secondary | ICD-10-CM

## 2019-07-04 NOTE — Progress Notes (Signed)
[facility] Consult Note Telephone: [phone]  Fax: [phone]  PATIENT NAME: Walter [name] DOB: 1931/12/08 MRN: [id number]  PRIMARY CARE PROVIDER:   Venia Carbon, MD  REFERRING PROVIDER:  Venia Carbon, MD [place],  [place] [address]  RESPONSIBLE PARTY: Wife, Walter [name] [phone]      RECOMMENDATIONS and PLAN:  1.  Advanced care planning. Patient is a DNR  2.  Parkinson's disease.  Patient takes Sinemet 25/100mg  QID.  Denies any increased tremors or freezing episodes.  No reported falls.  Is able to walk with walker.  Requires assistance with ADLs. He does get more worn out after bathing and dressing and sometimes will take a long nap afterwards. Has aides that come into the home to help with personal care.  Patient and wife pleased with the aides that come into the home.  Continue follow up and recommendations by neurology  3.  Bladder cancer.  Patient had follow up with urology after TURBT for bladder cancer and believed to have gotten all of the cancer. Wife asking about condom catheter.  Discussed that is still can increase risk of UTI.  States that he has to get up a couple times during the night to change his depends and that he gets skin irritation around groin area.  He does use barrier cream and an extra pad in his depends at night.  Have encouraged to limit fluid intake about 3 hours prior to bedtime. Next follow up in in 6 months.  Continue follow and recommendations by urology  Patient stable at this time. Denies falls, appetite good. Has had a slight decrease in weight to 160 but wife states that he does up and down with his weight. Denies SOB, cough, fever, N/V/D, constipation. Palliative will continue to monitor for symptom management/decline and make recommendations as needed.  Will call in 6 months to set up next appointment per patient/wife request. I believe this is reasonable considering his  stability. Encouraged to call sooner with any concerns.    I spent 60 minutes providing this consultation,  from 11:00 to 12:00 including time spent with patient/family, chart review, provider coordination, documentation. More than 50% of the time in this consultation was spent coordinating communication.   HISTORY OF PRESENT ILLNESS:  Walter [name] is a 86 y.o. year old male with multiple medical problems including Parkinson's disease, HTN, BPH, h/o oral cancer, hypothyroidism. Palliative Care was asked to help address goals of care.   CODE STATUS: DNR  PPS: 50% HOSPICE ELIGIBILITY/DIAGNOSIS: TBD  PHYSICAL EXAM:  BP 118/70  HR 68  O2 94% on RA General: NAD, frail appearing, thin Cardiovascular: regular rate and rhythm Pulmonary:lung sounds clear; normal respiratory effort Abdomen: soft, nontender, + bowel sounds GU: no suprapubic tenderness Extremities:trace edema of feet and ankles, no joint deformities Neurological: Weakness but otherwise nonfocal  PAST MEDICAL HISTORY:  Past Medical History:  Diagnosis Date  . Abdominal aortic aneurysm (AAA) ([place])   . ARF (acute renal failure) ([place]) 2011   after TKR  . BPH (benign prostatic hypertrophy)   . C. difficile diarrhea   . DVT (deep venous thrombosis) ([place]) 1998   after left knee arthroscopy  . Hx of colonoscopy 2000  . Hypertension   . Hypothyroid 2007   postop from thyroidectomy (cancer scare)  . Kidney stones    recurrent  . Oral cancer ([place]) 2004   graft from left thigh  . Osteoarthritis of both knees   .  Parkinson's disease ([place])   . Pulmonary embolism ([place])   . Tendonitis 2012   from quinolone  . Toe amputation status 2010    SOCIAL HX:  Social History   Tobacco Use  . Smoking status: Former Smoker    Types: Cigars    Quit date: 04/27/2002    Years since quitting: 17.1  . Smokeless tobacco: Never Used  Substance Use Topics  . Alcohol use: Not Currently    Alcohol/week: 0.0 standard drinks    ALLERGIES:    Allergies  Allergen Reactions  . Quinolones     tendonitis     PERTINENT MEDICATIONS:  Outpatient Encounter Medications as of 07/04/2019  Medication Sig  . acetaminophen (TYLENOL) 325 MG tablet Take 2 tablets (650 mg total) by mouth every 6 (six) hours as needed for mild pain or moderate pain (or Fever >/= 101).  Marland Kitchen apixaban (ELIQUIS) 2.5 MG TABS tablet Take 1 tablet (2.5 mg total) by mouth 2 (two) times daily.  . carbidopa-levodopa (SINEMET IR) 25-100 MG tablet Take 1 tablet by mouth 4 (four) times daily. (0800, 1200, 1600, & 2000)  . Cholecalciferol (VITAMIN D) 2000 UNITS CAPS Take 2,000 Units by mouth daily.   . DENTA 5000 PLUS 1.1 % CREA dental cream Place 1 application onto teeth daily.  Marland Kitchen levothyroxine (SYNTHROID) 112 MCG tablet Take 1 tablet (112 mcg total) by mouth daily before breakfast.  . vitamin B-12 (CYANOCOBALAMIN) 1000 MCG tablet Take 1,000 mcg by mouth daily.   No facility-administered encounter medications on file as of 07/04/2019.     Walter [name] Jenetta Downer, NP

## 2019-08-13 ENCOUNTER — Telehealth: Payer: Self-pay | Admitting: Adult Health Nurse Practitioner

## 2019-08-13 NOTE — Telephone Encounter (Signed)
Problems with servers.  Continuation of previous phone encounter of today.  Patient having increased SOB and weakness per SW.  Spoke with RN navigator and since he was discharged from home health earlier this year will need a new order from his PCP.  Reached back to SW to let her know this. Shahad Mazurek K. Olena Heckle NP

## 2019-08-13 NOTE — Telephone Encounter (Signed)
Spoke with Jackey Loge, SW at [facility].  Had concerns that he had dental procedure yesterday and in the middle of the night got up on his own and went from his bed to his chair.

## 2019-08-14 ENCOUNTER — Telehealth: Payer: Self-pay | Admitting: Adult Health Nurse Practitioner

## 2019-08-14 NOTE — Telephone Encounter (Signed)
Called wife to see if wanted follow up appointment.   Left VM with reason for call and call back info Stein Windhorst K. Olena Heckle NP

## 2019-08-15 ENCOUNTER — Telehealth: Payer: Self-pay | Admitting: Adult Health Nurse Practitioner

## 2019-08-15 NOTE — Telephone Encounter (Signed)
Spoke with patient's wife about recent concerns of weakness.  Have set up follow up appointment for June 30 at 11:30 Delpha Perko K. Olena Heckle NP

## 2019-08-21 ENCOUNTER — Telehealth: Payer: Self-pay | Admitting: Adult Health Nurse Practitioner

## 2019-08-21 NOTE — Telephone Encounter (Signed)
Returned wife's VM wanting to confirm next appointment.  Next appointment is August 28, 2019 at 11:30am Khylie Larmore K. Olena Heckle NP

## 2019-08-28 ENCOUNTER — Telehealth: Payer: Self-pay | Admitting: Adult Health Nurse Practitioner

## 2019-08-28 NOTE — Telephone Encounter (Signed)
Left voicemail for wife regarding needing to reschedule today's [facility] Palliative appointment.

## 2019-08-29 ENCOUNTER — Telehealth: Payer: Self-pay | Admitting: Adult Health Nurse Practitioner

## 2019-08-29 NOTE — Telephone Encounter (Signed)
Called to reschedule appointment.  Left VM with reason for call and call back info Tamotsu Wiederholt K. Olena Heckle NP

## 2019-08-29 NOTE — Telephone Encounter (Signed)
Spoke with wife and scheduled visit for July 15 at 12:30 Dalyn Becker K. Olena Heckle NP

## 2019-09-04 ENCOUNTER — Telehealth: Payer: Self-pay | Admitting: Radiology

## 2019-09-04 ENCOUNTER — Encounter: Payer: Self-pay | Admitting: Internal Medicine

## 2019-09-04 ENCOUNTER — Ambulatory Visit (INDEPENDENT_AMBULATORY_CARE_PROVIDER_SITE_OTHER): Payer: PPO | Admitting: Internal Medicine

## 2019-09-04 ENCOUNTER — Other Ambulatory Visit: Payer: Self-pay

## 2019-09-04 VITALS — BP 132/84 | HR 72 | Temp 98.1°F | Ht 70.0 in | Wt 150.0 lb

## 2019-09-04 DIAGNOSIS — G20 Parkinson's disease: Secondary | ICD-10-CM

## 2019-09-04 DIAGNOSIS — R5383 Other fatigue: Secondary | ICD-10-CM | POA: Diagnosis not present

## 2019-09-04 DIAGNOSIS — E441 Mild protein-calorie malnutrition: Secondary | ICD-10-CM | POA: Diagnosis not present

## 2019-09-04 LAB — CBC
HCT: 42.6 % (ref 39.0–52.0)
Hemoglobin: 14 g/dL (ref 13.0–17.0)
MCHC: 32.8 g/dL (ref 30.0–36.0)
MCV: 92 fl (ref 78.0–100.0)
Platelets: 211 10*3/uL (ref 150.0–400.0)
RBC: 4.63 Mil/uL (ref 4.22–5.81)
RDW: 13.7 % (ref 11.5–15.5)
WBC: 5.5 10*3/uL (ref 4.0–10.5)

## 2019-09-04 LAB — COMPREHENSIVE METABOLIC PANEL
ALT: 4 U/L (ref 0–53)
AST: 9 U/L (ref 0–37)
Albumin: 4.2 g/dL (ref 3.5–5.2)
Alkaline Phosphatase: 64 U/L (ref 39–117)
BUN: 27 mg/dL — ABNORMAL HIGH (ref 6–23)
CO2: 34 mEq/L — ABNORMAL HIGH (ref 19–32)
Calcium: 10 mg/dL (ref 8.4–10.5)
Chloride: 103 mEq/L (ref 96–112)
Creatinine, Ser: 1.61 mg/dL — ABNORMAL HIGH (ref 0.40–1.50)
GFR: 40.84 mL/min — ABNORMAL LOW (ref >60.00)
Glucose, Bld: 92 mg/dL (ref 70–99)
Potassium: 4.7 mEq/L (ref 3.5–5.1)
Sodium: 143 mEq/L (ref 135–145)
Total Bilirubin: 1 mg/dL (ref 0.2–1.2)
Total Protein: 6.9 g/dL (ref 6.0–8.3)

## 2019-09-04 LAB — VITAMIN D 25 HYDROXY (VIT D DEFICIENCY, FRACTURES): VITD: >120 ng/mL

## 2019-09-04 LAB — VITAMIN B12: Vitamin B-12: >1526 pg/mL — ABNORMAL HIGH (ref 211–911)

## 2019-09-04 LAB — T4, FREE: Free T4: 1.34 ng/dL (ref 0.60–1.60)

## 2019-09-04 LAB — SEDIMENTATION RATE: Sed Rate: 15 mm/hr (ref 0–20)

## 2019-09-04 MED ORDER — CARBIDOPA-LEVODOPA 25-250 MG PO TABS: 1.0000 | ORAL_TABLET | Freq: Three times a day (TID) | ORAL | 1 refills | Status: DC

## 2019-09-04 NOTE — Assessment & Plan Note (Addendum)
Doesn't seem to have any response to the sinemet Will try higher dose--but if that doesn't help, may just be better to stop it Will set up with Dr Tat as well

## 2019-09-04 NOTE — Progress Notes (Signed)
Subjective:    Patient ID: Walter [name], male    DOB: 25-Mar-1931, 86 y.o.   MRN: [id number]  HPI Here with wife and daughter Walter [name] due to increased weakness This visit occurred during the SARS-CoV-2 public health emergency.  Safety protocols were in place, including screening questions prior to the visit, additional usage of staff PPE, and extensive cleaning of exam room while observing appropriate contact time as indicated for disinfecting solutions.   In the past 2 weeks---much more fatigued Would sleep all day long if wife would let him More uncertain about movements like sitting down Needs help with bed transfers  Wife feels it is the Parkinson's Gets the sinemet 3-4 times a day Doesn't seem to have any change with the sinemet No tremors--just the slow movements and weakness  Wife is up changing linens, etc as much as 3 times per night--even with pullups Won't use the urinal  Current Outpatient Medications on File Prior to Visit  Medication Sig Dispense Refill   acetaminophen (TYLENOL) 325 MG tablet Take 2 tablets (650 mg total) by mouth every 6 (six) hours as needed for mild pain or moderate pain (or Fever >/= 101).     apixaban (ELIQUIS) 2.5 MG TABS tablet Take 1 tablet (2.5 mg total) by mouth 2 (two) times daily. 180 tablet 3   carbidopa-levodopa (SINEMET IR) 25-100 MG tablet Take 1 tablet by mouth 4 (four) times daily. (0800, 1200, 1600, & 2000) 360 tablet 3   Cholecalciferol (VITAMIN D) 2000 UNITS CAPS Take 2,000 Units by mouth daily.      DENTA 5000 PLUS 1.1 % CREA dental cream Place 1 application onto teeth daily.     levothyroxine (SYNTHROID) 112 MCG tablet Take 1 tablet (112 mcg total) by mouth daily before breakfast. 90 tablet 3   vitamin B-12 (CYANOCOBALAMIN) 1000 MCG tablet Take 1,000 mcg by mouth daily.     No current facility-administered medications on file prior to visit.    Allergies  Allergen Reactions   Quinolones     tendonitis    Past Medical  History:  Diagnosis Date   Abdominal aortic aneurysm (AAA) ([place])    ARF (acute renal failure) ([place]) 2011   after TKR   BPH (benign prostatic hypertrophy)    C. difficile diarrhea    DVT (deep venous thrombosis) ([place]) 1998   after left knee arthroscopy   Hx of colonoscopy 2000   Hypertension    Hypothyroid 2007   postop from thyroidectomy (cancer scare)   Kidney stones    recurrent   Oral cancer ([place]) 2004   graft from left thigh   Osteoarthritis of both knees    Parkinson's disease ([place])    Pulmonary embolism ([place])    Tendonitis 2012   from quinolone   Toe amputation status 2010    Past Surgical History:  Procedure Laterality Date   APPENDECTOMY     CHOLECYSTECTOMY     CYSTOSCOPY W/ RETROGRADES Bilateral 03/18/2019   Procedure: CYSTOSCOPY WITH RETROGRADE PYELOGRAM;  Surgeon: Hollice Espy, MD;  Location: [facility];  Service: Urology;  Laterality: Bilateral;   CYSTOSCOPY/URETEROSCOPY/HOLMIUM LASER/STENT PLACEMENT Right 09/13/2017   Procedure: CYSTOSCOPY/URETEROSCOPY/HOLMIUM LASER/STENT PLACEMENT;  Surgeon: Hollice Espy, MD;  Location: [facility];  Service: Urology;  Laterality: Right;   HERNIA REPAIR     INTRAMEDULLARY (IM) NAIL INTERTROCHANTERIC Right 06/13/2017   Procedure: INTRAMEDULLARY (IM) NAIL INTERTROCHANTRIC;  Surgeon: Hessie Knows, MD;  Location: [facility];  Service: Orthopedics;  Laterality: Right;   LITHOTRIPSY  2009/2010  then stent and cystoscopic removal 2014   Oral cancer removed Right 2004   REPAIR ZENKER'S DIVERTICULA  2013   THYROIDECTOMY  2007   TOE AMPUTATION Right 2010   badly overlapping other toe   TOTAL KNEE ARTHROPLASTY Left    TRANSURETHRAL RESECTION OF BLADDER TUMOR WITH MITOMYCIN-C N/A 03/18/2019   Procedure: TRANSURETHRAL RESECTION OF BLADDER TUMOR WITH Gemcitabine;  Surgeon: Hollice Espy, MD;  Location: [facility];  Service: Urology;  Laterality: N/A;   TRANSURETHRAL RESECTION OF PROSTATE  4742   UMBILICAL  HERNIA REPAIR  2007    Family History  Problem Relation Age of Onset   Dementia Mother    Stroke Father    Pulmonary disease Brother    Heart disease Neg Hx    Diabetes Neg Hx     Social History   Socioeconomic History   Marital status: Married    Spouse name: Not on file   Number of children: 3   Years of education: Not on file   Highest education level: Not on file  Occupational History   Occupation: Merchandiser, 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  Tobacco Use   Smoking status: Former Smoker    Types: Cigars    Quit date: 04/27/2002    Years since quitting: 17.3   Smokeless tobacco: Never Used  Vaping Use   Vaping Use: Never used  Substance and Sexual Activity   Alcohol use: Not Currently    Alcohol/week: 0.0 standard drinks   Drug use: No   Sexual activity: Not Currently  Other Topics Concern   Not on file  Social History Narrative   Son works for [employer]   1 daughter in [place], other in [place]      Has living will   Wife, then son, would be health care POA   Has DNR   No tube feeds if cognitively unaware   Has MOST form done 12/21/18   Social Determinants of Health   Financial Resource Strain:    Difficulty of Paying Living Expenses:   Food Insecurity:    Worried About Charity fundraiser in the Last Year:    Arboriculturist in the Last Year:   Transportation Needs:    Film/video editor (Medical):    Lack of Transportation (Non-Medical):   Physical Activity:    Days of Exercise per Week:    Minutes of Exercise per Session:   Stress:    Feeling of Stress :   Social Connections:    Frequency of Communication with Friends and Family:    Frequency of Social Gatherings with Friends and Family:    Attends Religious Services:    Active Member of Clubs or Organizations:    Attends Music therapist:    Marital Status:   Intimate Partner Violence:    Fear of  Current or Ex-Partner:    Emotionally Abused:    Physically Abused:    Sexually Abused:    Review of Systems Appetite is good Still has the Mom's meals---but weight is back down 10# No pain No SOB Some phlegmy cough (loose but not productive) Bowels are slow---miralax plus fiber (discussed adding senna -s Wife trying to get more help---but aides not really available Not depressed--but less interested in the paper, etc    Objective:   Physical Exam Constitutional:      Comments: Some wasting  Cardiovascular:     Rate and Rhythm: Normal rate and regular rhythm.  Heart sounds: No murmur heard.  No gallop.      Comments: Bigeminy mostly Pulmonary:     Effort: Pulmonary effort is normal.     Breath sounds: Normal breath sounds. No wheezing or rales.  Musculoskeletal:     Cervical back: Neck supple.  Lymphadenopathy:     Cervical: No cervical adenopathy.  Neurological:     Mental Status: He is alert.     Comments: No tremor Significant bradykinesia Shuffling gait  Psychiatric:     Comments: No apparent depression though significant psychomotor retardation from the Parkinson's            Assessment & Plan:

## 2019-09-04 NOTE — Assessment & Plan Note (Signed)
Non specific Unclear if it could be related to sleep issues, the sinemet, etc Will check some labs

## 2019-09-04 NOTE — Telephone Encounter (Signed)
Please call patient/wife and ask them to hold both vitamin B12 and D as both levels returned too high in the blood work.

## 2019-09-04 NOTE — Assessment & Plan Note (Signed)
He has lost more weight despite the fortified "Mom's meals" and boost Will check labs

## 2019-09-04 NOTE — Telephone Encounter (Signed)
[facility] lab called a critical Vit D level - > 120. Results to Dr Silvio Pate,  Dr Danise Mina

## 2019-09-04 NOTE — Patient Instructions (Signed)
Try adding senna-s 1-2 tabs daily along with the miralax.

## 2019-09-05 NOTE — Telephone Encounter (Signed)
Spoke with pt/pt's wife relaying Dr. Synthia Innocent message.  They verbalize understanding.

## 2019-09-05 NOTE — Telephone Encounter (Signed)
Noted I will review the full labs shortly

## 2019-09-12 ENCOUNTER — Other Ambulatory Visit: Payer: Self-pay

## 2019-09-12 ENCOUNTER — Other Ambulatory Visit: Payer: PPO | Admitting: Adult Health Nurse Practitioner

## 2019-09-12 DIAGNOSIS — I2782 Chronic pulmonary embolism: Secondary | ICD-10-CM | POA: Diagnosis not present

## 2019-09-12 DIAGNOSIS — G20 Parkinson's disease: Secondary | ICD-10-CM

## 2019-09-12 DIAGNOSIS — I2692 Saddle embolus of pulmonary artery without acute cor pulmonale: Secondary | ICD-10-CM | POA: Diagnosis not present

## 2019-09-12 DIAGNOSIS — I129 Hypertensive chronic kidney disease with stage 1 through stage 4 chronic kidney disease, or unspecified chronic kidney disease: Secondary | ICD-10-CM | POA: Diagnosis not present

## 2019-09-12 DIAGNOSIS — Z515 Encounter for palliative care: Secondary | ICD-10-CM

## 2019-09-12 DIAGNOSIS — R262 Difficulty in walking, not elsewhere classified: Secondary | ICD-10-CM | POA: Diagnosis not present

## 2019-09-12 DIAGNOSIS — Z741 Need for assistance with personal care: Secondary | ICD-10-CM | POA: Diagnosis not present

## 2019-09-12 DIAGNOSIS — M6281 Muscle weakness (generalized): Secondary | ICD-10-CM | POA: Diagnosis not present

## 2019-09-12 DIAGNOSIS — R278 Other lack of coordination: Secondary | ICD-10-CM | POA: Diagnosis not present

## 2019-09-12 DIAGNOSIS — Z86718 Personal history of other venous thrombosis and embolism: Secondary | ICD-10-CM | POA: Diagnosis not present

## 2019-09-12 DIAGNOSIS — R4189 Other symptoms and signs involving cognitive functions and awareness: Secondary | ICD-10-CM | POA: Diagnosis not present

## 2019-09-12 DIAGNOSIS — R2681 Unsteadiness on feet: Secondary | ICD-10-CM | POA: Diagnosis not present

## 2019-09-12 NOTE — Progress Notes (Signed)
[facility] Consult Note Telephone: [phone]  Fax: [phone]  PATIENT NAME: Walter [name] DOB: [birth year] MRN: [id number]  PRIMARY CARE PROVIDER:   Venia Carbon, MD  REFERRING PROVIDER:  Venia Carbon, MD [place],  [place] [address]  RESPONSIBLE PARTY:   Wife, Anibal Quinby [phone]       RECOMMENDATIONS and PLAN:  1.  Advanced care planning. Patient is a DNR  2.  Parkinson's disease.  Patient has been having increased weakness.  Wife does state that he can still use his walker to get to the mailbox and back to house. No reported falls. He is having increased tiredness after activity and is taking more naps.  His speech seems slower today than from previous visits. Wife states that he is not as expressive as he used to be.  He does express more apathy and feels like he has less to contribute to conversation when in a group.  Patient recently seen by PCP and sinemet was increased to 25/250 mg TID from 25/100 mg QID about a week ago.  Wife states that she has not noticed a difference but patient states that he feels somewhat stronger.  He is unable to express exactly how it makes him feel but feels like it is helping even if just a little.    Patient has been seen by neurology in the past but wife states that it has been a while since they have had an appointment there.  States that she has tried calling to set up an appointment but it was several months out but she could not remember the exact day.  We will reach out to both neurology and PCP with update on increase in Sinemet and to see if neurology would like to see him sooner regarding these changes.  Patient was evaluated today for home health PT services.  Continue PT as ordered.  3.  Support.  Wife states that they are having an aide that comes in every morning now to help with personal care for her husband and will also help with some household chores.  States  that this has been very helpful for her and her husband.  She also has a next-door neighbor who is willing to help out when she can.  4.  Nutritional status.  Patient states that his appetite is good.  Wife states that she still feeds him the pured moms meals.  Patient has had a 15 pound weight loss over the past 3 months.  On 06/17/2019 he weighed 165 pounds and on 09/04/2019 he weighed 150 pounds.  Wife states that she she may need to start giving him boost shakes throughout the day as well.  Have encouraged her to supplement with the boost.  We will continue to monitor for any continued weight loss and make recommendations as needed.  I spent 80 minutes providing this consultation,  from 12:30 to 1:50 including time spent with patient/family, chart review, provider coordination, documentation. More than 50% of the time in this consultation was spent coordinating communication.   HISTORY OF PRESENT ILLNESS:  Chad Donoghue is a 86 y.o. year old male with multiple medical problems including Parkinson's disease, HTN, BPH, h/o oral cancer, hypothyroidism. Palliative Care was asked to help address goals of care.   CODE STATUS: DNR  PPS: 40% HOSPICE ELIGIBILITY/DIAGNOSIS: TBD  PHYSICAL EXAM: BP 136/80 HR 69 O2 99% on RA General: NAD, frail appearing, thin Cardiovascular: regular rate and  rhythm Pulmonary:lung sounds clear; normal respiratory effort Abdomen: soft, nontender, + bowel sounds GU: no suprapubic tenderness Extremities:trace edema of feet and ankles, no joint deformities Neurological: Weakness but otherwise nonfocal  PAST MEDICAL HISTORY:  Past Medical History:  Diagnosis Date   Abdominal aortic aneurysm (AAA) ([facility])    ARF (acute renal failure) ([place]) 2011   after TKR   BPH (benign prostatic hypertrophy)    C. difficile diarrhea    DVT (deep venous thrombosis) ([facility]) 1998   after left knee arthroscopy   Hx of colonoscopy 2000   Hypertension    Hypothyroid 2007    postop from thyroidectomy (cancer scare)   Kidney stones    recurrent   Oral cancer ([facility]) 2004   graft from left thigh   Osteoarthritis of both knees    Parkinson's disease ([facility])    Pulmonary embolism ([facility])    Tendonitis 2012   from quinolone   Toe amputation status 2010    SOCIAL HX:  Social History   Tobacco Use   Smoking status: Former Smoker    Types: Cigars    Quit date: 04/27/2002    Years since quitting: 17.3   Smokeless tobacco: Never Used  Substance Use Topics   Alcohol use: Not Currently    Alcohol/week: 0.0 standard drinks    ALLERGIES:  Allergies  Allergen Reactions   Quinolones     tendonitis     PERTINENT MEDICATIONS:  Outpatient Encounter Medications as of 09/12/2019  Medication Sig   acetaminophen (TYLENOL) 325 MG tablet Take 2 tablets (650 mg total) by mouth every 6 (six) hours as needed for mild pain or moderate pain (or Fever >/= 101).   apixaban (ELIQUIS) 2.5 MG TABS tablet Take 1 tablet (2.5 mg total) by mouth 2 (two) times daily.   carbidopa-levodopa (SINEMET) 25-250 MG tablet Take 1 tablet by mouth 3 (three) times daily.   Cholecalciferol (VITAMIN D) 2000 UNITS CAPS Take 2,000 Units by mouth daily.    DENTA 5000 PLUS 1.1 % CREA dental cream Place 1 application onto teeth daily.   levothyroxine (SYNTHROID) 112 MCG tablet Take 1 tablet (112 mcg total) by mouth daily before breakfast.   vitamin B-12 (CYANOCOBALAMIN) 1000 MCG tablet Take 1,000 mcg by mouth daily.   No facility-administered encounter medications on file as of 09/12/2019.     Lulubelle Simcoe Jenetta Downer, NP

## 2019-09-16 NOTE — Progress Notes (Signed)
Assessment/Plan:   1.  Parkinsons Disease  -Stop carbidopa/levodopa 25/250, 1 tablet three times per day.  With this version there is increased peripheral breakdown due to less carbidopa.  -Start parcopa 25/100, 2 tablets at 8 AM, 1 tablet at 11 AM, 2 tablets at 2 PM, 1 tablet at 6 PM.  While this is not quite as much dosage as he has been taking the last few weeks, this is a much higher dosage than he was taking previous.  The sleepiness may be from the overall increased dose, as well as from the peripheral breakdown from decreased carbidopa.  In addition, he is chewing the current med, which causes it not to last as long so qid dosing is better than tid dosing.  Changing to parcopa because he cannot even swallow levodopa with applesauce/ice cream.  -Patient currently being seen by palliative care.  -just started PT at [facility].  We will write order to continue  2. Dysphagia  -Biggest issue is the Zenker's diverticula.  Last MBE was in October, 2020.  From a neurologic standpoint, it actually looked fairly good.  Zenker's diverticula did fill quickly with material.  He follows with gastroenterology, but they felt there is nothing more that he could do.  -Diet is currently modified to mechanical soft.  3.  B12 deficiency  -Primary care just ha3d him stop his supplement because he was supratherapeutic.  4.  RBD  -Patient has had a fall out of the bed despite a bed rail.  Wonder if a hospital bed would be of more value.  5.  Memory loss  -suspect PDD.  This is wifes biggest c/o  -we will schedule neurocognitive testing  Subjective:   Walter [name] was seen today in follow up for Parkinsons disease.  My previous records were reviewed prior to todays visit as well as outside records available to me.  Pt had an appt with me in April but cx that.  Patient saw primary care on July 7.  Those notes are reviewed.  Patient was complaining about more fatigue and daytime sleepiness.  He was needing  more help with transferring in bed.  Primary care increased his levodopa from carbidopa/levodopa 25/100, 1 tablet four times per day to carbidopa/levodopa 25/250, 1 tablet three times per day.  I get a note from the palliative care nurse practitioner that the patient was more sleepy and speech was worse.  Patient felt that he was stronger but his wife did not.  When asked about the symptoms, they have trouble with telling me the sx's that they were c/o to promote the increase in the medication.  They talk about lack of energy (before the med change) as well as cognitive change.  He reports that he is more sleepy with the medication change.  Pt reports that the change in med has been helpful but wife seems doubtful.  He also chews the levodopa due to dysphagia even when taking with applesauce or ice cream.  Overall, pt thinks that motor sx's are better but wife thinks that she is not convinced and cognitive sx's are worsening.  Current prescribed movement disorder medications: When I saw the patient, I was having him take carbidopa/levodopa 25/100, 1 tablet at 8 AM/11 AM/2 PM/6 PM.  This has since been increased to carbidopa/levodopa 25/250, 1 tablet 3 times per day by primary care physician (8am/noon/6pm) Rozerem (started last visit for RBD, but patient states that he is no longer on it)  PREVIOUS MEDICATIONS: clonazepam (  could not tolerate)  ALLERGIES:   Allergies  Allergen Reactions  . Quinolones     tendonitis    CURRENT MEDICATIONS:  Outpatient Encounter Medications as of 09/17/2019  Medication Sig  . acetaminophen (TYLENOL) 325 MG tablet Take 2 tablets (650 mg total) by mouth every 6 (six) hours as needed for mild pain or moderate pain (or Fever >/= 101).  Marland Kitchen apixaban (ELIQUIS) 2.5 MG TABS tablet Take 1 tablet (2.5 mg total) by mouth 2 (two) times daily.  . carbidopa-levodopa (SINEMET) 25-250 MG tablet Take 1 tablet by mouth 3 (three) times daily.  . DENTA 5000 PLUS 1.1 % CREA dental cream  Place 1 application onto teeth daily.  Marland Kitchen levothyroxine (SYNTHROID) 112 MCG tablet Take 1 tablet (112 mcg total) by mouth daily before breakfast.  . [DISCONTINUED] Cholecalciferol (VITAMIN D) 2000 UNITS CAPS Take 2,000 Units by mouth daily.  (Patient not taking: Reported on 09/17/2019)  . [DISCONTINUED] vitamin B-12 (CYANOCOBALAMIN) 1000 MCG tablet Take 1,000 mcg by mouth daily. (Patient not taking: Reported on 09/17/2019)   No facility-administered encounter medications on file as of 09/17/2019.    Objective:   PHYSICAL EXAMINATION:    VITALS:   Vitals:   09/17/19 1429  BP: (!) 139/57  Pulse: 69  SpO2: 95%  Weight: 161 lb (73 kg)    GEN:  The patient appears stated age and is in NAD. HEENT:  Normocephalic, atraumatic.  The mucous membranes are moist. The superficial temporal arteries are without ropiness or tenderness. CV:  RRR Lungs:  CTAB Neck/HEME:  There are no carotid bruits bilaterally.  Neurological examination:  Orientation: The patient is alert and oriented x2.  More confused today.  Looks to his wife for help with finer aspects of the history. Cranial nerves: There is good facial symmetry with facial hypomimia. The speech is fluent and clear. Soft palate rises symmetrically and there is no tongue deviation. Hearing is intact to conversational tone. Sensation: Sensation is intact to light touch throughout Motor: Strength is at least antigravity x4.  Movement examination: Tone: There is normal tone in the upper and lower extremities Abnormal movements: None Coordination:  There is no significant decremation with RAM's Gait and Station: The patient pushes off of the chair and requires mild assistance (standby) out of the chair.  He is given a walker.  He actually picks up the walker initially instead of rolling it.  He then rolls it through the doorway and walks fairly well with it.  He did pick it up in the turn.  He turns en bloc.    I have reviewed and interpreted the  following labs independently    Chemistry      Component Value Date/Time   NA 143 09/04/2019 1315   K 4.7 09/04/2019 1315   CL 103 09/04/2019 1315   CO2 34 (H) 09/04/2019 1315   BUN 27 (H) 09/04/2019 1315   CREATININE 1.61 (H) 09/04/2019 1315      Component Value Date/Time   CALCIUM 10.0 09/04/2019 1315   ALKPHOS 64 09/04/2019 1315   AST 9 09/04/2019 1315   ALT 4 09/04/2019 1315   BILITOT 1.0 09/04/2019 1315       Lab Results  Component Value Date   WBC 5.5 09/04/2019   HGB 14.0 09/04/2019   HCT 42.6 09/04/2019   MCV 92.0 09/04/2019   PLT 211.0 09/04/2019    Lab Results  Component Value Date   TSH 0.569 11/11/2018     Total time spent on  today's visit was 40 minutes, including both face-to-face time and nonface-to-face time.  Time included that spent on review of records (prior notes available to me/labs/imaging if pertinent), discussing treatment and goals, answering patient's questions and coordinating care.  Cc:  Venia Carbon, MD

## 2019-09-17 ENCOUNTER — Telehealth: Payer: Self-pay

## 2019-09-17 ENCOUNTER — Encounter: Payer: Self-pay | Admitting: Neurology

## 2019-09-17 ENCOUNTER — Other Ambulatory Visit: Payer: Self-pay

## 2019-09-17 ENCOUNTER — Ambulatory Visit: Payer: PPO | Admitting: Neurology

## 2019-09-17 VITALS — BP 139/57 | HR 69 | Wt 161.0 lb

## 2019-09-17 DIAGNOSIS — G20 Parkinson's disease: Secondary | ICD-10-CM

## 2019-09-17 DIAGNOSIS — R1312 Dysphagia, oropharyngeal phase: Secondary | ICD-10-CM

## 2019-09-17 DIAGNOSIS — R413 Other amnesia: Secondary | ICD-10-CM | POA: Diagnosis not present

## 2019-09-17 MED ORDER — CARBIDOPA-LEVODOPA 25-100 MG PO TBDP: ORAL_TABLET | ORAL | 1 refills | Status: DC

## 2019-09-17 NOTE — Telephone Encounter (Signed)
Left message for patient informing that Dr Tat is ok with patient continuing physical therapy.

## 2019-09-17 NOTE — Telephone Encounter (Signed)
Patient was seen in the office today.   Patients wife states they discussed physical therapy with Dr Tat.  Wife states the patient is currently in therapy and wants him to continue once the sessions he is in finishes. She states Dr Tat is the only one who can get the patient's physical therapy approved with patients condition. She wants to know if the patient can have another referral to continue therapy.

## 2019-09-17 NOTE — Patient Instructions (Addendum)
1.  Stop carbidopa/levodopa 25/250 2.  Take parcopa 25/100, 2 tablets at 8 AM, 1 tablet at 11 AM, 2 tablets at 2 PM, 1 tablet at 6 PM  You have been referred for a neurocognitive evaluation in our office.   The evaluation has two parts.   . The first part of the evaluation is a clinical interview with the neuropsychologist (Dr. Melvyn Novas or Dr. Nicole Kindred). Please bring someone with you to this appointment if possible, as it is helpful for the doctor to hear from both you and another adult who knows you well.   . The second part of the evaluation is testing with the doctor's technician Hinton Dyer or Maudie Mercury). The testing includes a variety of tasks- mostly question-and-answer, some paper-and-pencil. There is nothing you need to do to prepare for this appointment, but having a good night's sleep prior to the testing, taking medications as you normally would, and bringing eyeglasses and hearing aids (if you wear them), is advised. Please make sure that you wear a mask to the appointment.  Please note: We have to reserve several hours of the neuropsychologist's time and the psychometrician's time for your evaluation appointment. As such, please note that there is a No-Show fee of $100. If you are unable to attend any of your appointments, please contact our office as soon as possible to reschedule.

## 2019-09-17 NOTE — Telephone Encounter (Signed)
[facility]ney for PT order.  He is at [facility] for PT

## 2019-09-26 ENCOUNTER — Encounter: Payer: Self-pay | Admitting: Family Medicine

## 2019-09-26 ENCOUNTER — Other Ambulatory Visit: Payer: Self-pay | Admitting: Internal Medicine

## 2019-09-26 ENCOUNTER — Ambulatory Visit (INDEPENDENT_AMBULATORY_CARE_PROVIDER_SITE_OTHER): Payer: PPO | Admitting: Family Medicine

## 2019-09-26 ENCOUNTER — Other Ambulatory Visit: Payer: Self-pay

## 2019-09-26 VITALS — BP 140/80 | HR 40 | Temp 98.3°F | Ht 70.0 in | Wt 155.0 lb

## 2019-09-26 DIAGNOSIS — M2041 Other hammer toe(s) (acquired), right foot: Secondary | ICD-10-CM | POA: Diagnosis not present

## 2019-09-26 DIAGNOSIS — M009 Pyogenic arthritis, unspecified: Secondary | ICD-10-CM | POA: Diagnosis not present

## 2019-09-26 DIAGNOSIS — L84 Corns and callosities: Secondary | ICD-10-CM | POA: Insufficient documentation

## 2019-09-26 DIAGNOSIS — R001 Bradycardia, unspecified: Secondary | ICD-10-CM

## 2019-09-26 HISTORY — DX: Bradycardia, unspecified: R00.1

## 2019-09-26 LAB — CBC WITH DIFFERENTIAL/PLATELET
Basophils Absolute: 0 10*3/uL (ref 0.0–0.1)
Basophils Relative: 0.4 % (ref 0.0–3.0)
Eosinophils Absolute: 0.1 10*3/uL (ref 0.0–0.7)
Eosinophils Relative: 1.4 % (ref 0.0–5.0)
HCT: 41.3 % (ref 39.0–52.0)
Hemoglobin: 13.7 g/dL (ref 13.0–17.0)
Lymphocytes Relative: 17.8 % (ref 12.0–46.0)
Lymphs Abs: 0.9 10*3/uL (ref 0.7–4.0)
MCHC: 33.2 g/dL (ref 30.0–36.0)
MCV: 91.6 fl (ref 78.0–100.0)
Monocytes Absolute: 0.5 10*3/uL (ref 0.1–1.0)
Monocytes Relative: 9 % (ref 3.0–12.0)
Neutro Abs: 3.7 10*3/uL (ref 1.4–7.7)
Neutrophils Relative %: 71.4 % (ref 43.0–77.0)
Platelets: 198 10*3/uL (ref 150.0–400.0)
RBC: 4.51 Mil/uL (ref 4.22–5.81)
RDW: 14.5 % (ref 11.5–15.5)
WBC: 5.2 10*3/uL (ref 4.0–10.5)

## 2019-09-26 LAB — URIC ACID: Uric Acid, Serum: 6.4 mg/dL (ref 4.0–7.8)

## 2019-09-26 MED ORDER — CEPHALEXIN 500 MG PO CAPS: 500.0000 mg | ORAL_CAPSULE | Freq: Three times a day (TID) | ORAL | 0 refills | Status: DC

## 2019-09-26 NOTE — Assessment & Plan Note (Signed)
Refer to podiatry for definitive treatment.

## 2019-09-26 NOTE — Patient Instructions (Signed)
Elevate foot as able.  Keep area from running shoe.. post op shoe recommended. Start antibiotics x 7 days.   Please stop at the lab to have labs drawn. Podiatry will call for an appointment.

## 2019-09-26 NOTE — Progress Notes (Signed)
Chief Complaint  Patient presents with  . Infected Toe    Right Foot    History of Present Illness: HPI   86 year old male patient of Dr. Alla German with history of HTN, parkinson's, PE, cor pulmonale, CKD, bladder cancer, malnutrition and pressure ulcers presents with wife for right toe infection.  He has been more active with PT for Parkisnon's.    2nd digit in right foot.. corn rubbing on shoes. In last week he noted pain in 2nd toe, new swelling and redness.  Trouble sleeping given pain. Pain with standing, walking. Gradually worsening in last 3 days.  Wearing post op shoe.   No fever.  Hx of amputation of  Toes in past on right foot  15 years ago  HR is low in office today. Last pulse 69-72  Dr. Carles Collet stopped carbidopa/levodopa... changes to parcopa  This visit occurred during the SARS-CoV-2 public health emergency.  Safety protocols were in place, including screening questions prior to the visit, additional usage of staff PPE, and extensive cleaning of exam room while observing appropriate contact time as indicated for disinfecting solutions.   COVID 19 screen:  No recent travel or known exposure to COVID19 The patient denies respiratory symptoms of COVID 19 at this time. The importance of social distancing was discussed today.     Review of Systems  Constitutional: Negative for chills and fever.  HENT: Negative for congestion and ear pain.   Eyes: Negative for pain and redness.  Respiratory: Negative for cough and shortness of breath.   Cardiovascular: Negative for chest pain, palpitations and leg swelling.  Gastrointestinal: Negative for abdominal pain, blood in stool, constipation, diarrhea, nausea and vomiting.  Genitourinary: Negative for dysuria.  Musculoskeletal: Negative for falls and myalgias.  Skin: Negative for rash.  Neurological: Negative for dizziness.  Psychiatric/Behavioral: Negative for depression. The patient is not nervous/anxious.       Past  Medical History:  Diagnosis Date  . Abdominal aortic aneurysm (AAA) ([place])   . ARF (acute renal failure) ([place]) 2011   after TKR  . BPH (benign prostatic hypertrophy)   . C. difficile diarrhea   . DVT (deep venous thrombosis) ([place]) 1998   after left knee arthroscopy  . Hx of colonoscopy 2000  . Hypertension   . Hypothyroid 2007   postop from thyroidectomy (cancer scare)  . Kidney stones    recurrent  . Oral cancer ([place]) 2004   graft from left thigh  . Osteoarthritis of both knees   . Parkinson's disease ([place])   . Pulmonary embolism ([place])   . Tendonitis 2012   from quinolone  . Toe amputation status 2010    reports that he quit smoking about 17 years ago. His smoking use included cigars. He has never used smokeless tobacco. He reports previous alcohol use. He reports that he does not use drugs.   Current Outpatient Medications:  .  acetaminophen (TYLENOL) 325 MG tablet, Take 2 tablets (650 mg total) by mouth every 6 (six) hours as needed for mild pain or moderate pain (or Fever >/= 101)., Disp: , Rfl:  .  apixaban (ELIQUIS) 2.5 MG TABS tablet, Take 1 tablet (2.5 mg total) by mouth 2 (two) times daily., Disp: 180 tablet, Rfl: 3 .  carbidopa-levodopa (PARCOPA) 25-100 MG disintegrating tablet, 2 tablets at 8 AM, 1 tablet at 11 AM, 2 tablets at 2 PM, 1 tablet at 6 PM, Disp: 540 tablet, Rfl: 1 .  DENTA 5000 PLUS 1.1 % CREA dental cream,  Place 1 application onto teeth daily., Disp: , Rfl:  .  levothyroxine (SYNTHROID) 112 MCG tablet, Take 1 tablet (112 mcg total) by mouth daily before breakfast., Disp: 90 tablet, Rfl: 3   Observations/Objective: Blood pressure (!) 140/80, pulse (!) 40, temperature 98.3 F (36.8 C), temperature source Temporal, height 5\' 10"  (1.778 m), weight 155 lb (70.3 kg), SpO2 99 %.  Physical Exam Constitutional:      Appearance: He is well-developed.  HENT:     Head: Normocephalic.     Right Ear: Hearing normal.     Left Ear: Hearing normal.     Nose: Nose  normal.  Neck:     Thyroid: No thyroid mass or thyromegaly.     Vascular: No carotid bruit.     Trachea: Trachea normal.  Cardiovascular:     Rate and Rhythm: Regular rhythm. Bradycardia present.     Pulses: Normal pulses.     Heart sounds: Heart sounds not distant. No murmur heard.  No friction rub. No gallop.      Comments: No peripheral edema   On exam HR is in mid 50s  Pulmonary:     Effort: Pulmonary effort is normal. No respiratory distress.     Breath sounds: Normal breath sounds.  Musculoskeletal:     Right foot: Deformity present.  Feet:     Right foot:     Skin integrity: Erythema, warmth and callus present.     Comments: Deformity and medial angulation of 3rd digit on right foot, 2nd digit missing  Hammer toes  Callus on IP joint of 3rd digit with significant surrounding erythema, warm and ttp Skin:    General: Skin is warm and dry.     Findings: No rash.  Psychiatric:        Speech: Speech normal.        Behavior: Behavior normal.        Thought Content: Thought content normal.      Assessment and Plan   Inflammation of interphalangeal joint of right toe due to infection (HCC) Treat with oral antibiotics. Eval; CBC. Wear post op shoes and avoid pressure on digit.  Hammer toe of right foot Refer to podiatry for definitive treatment.  Bradycardia ? Due to med and dose change of parkinson's med per Dr. Carles Collet.   Pt asymptomatic and BP at goal.  Follow up with PCP for further evaluation.     Eliezer Lofts, MD

## 2019-09-26 NOTE — Assessment & Plan Note (Signed)
?   Due to med and dose change of parkinson's med per Dr. Carles Collet.   Pt asymptomatic and BP at goal.  Follow up with PCP for further evaluation.

## 2019-09-26 NOTE — Assessment & Plan Note (Addendum)
Treat with oral antibiotics. Eval; CBC. Wear post op shoes and avoid pressure on digit.

## 2019-10-02 DIAGNOSIS — G20 Parkinson's disease: Secondary | ICD-10-CM | POA: Diagnosis not present

## 2019-10-02 DIAGNOSIS — R4189 Other symptoms and signs involving cognitive functions and awareness: Secondary | ICD-10-CM | POA: Diagnosis not present

## 2019-10-02 DIAGNOSIS — Z86718 Personal history of other venous thrombosis and embolism: Secondary | ICD-10-CM | POA: Diagnosis not present

## 2019-10-02 DIAGNOSIS — R2681 Unsteadiness on feet: Secondary | ICD-10-CM | POA: Diagnosis not present

## 2019-10-02 DIAGNOSIS — M6281 Muscle weakness (generalized): Secondary | ICD-10-CM | POA: Diagnosis not present

## 2019-10-02 DIAGNOSIS — I129 Hypertensive chronic kidney disease with stage 1 through stage 4 chronic kidney disease, or unspecified chronic kidney disease: Secondary | ICD-10-CM | POA: Diagnosis not present

## 2019-10-02 DIAGNOSIS — Z741 Need for assistance with personal care: Secondary | ICD-10-CM | POA: Diagnosis not present

## 2019-10-02 DIAGNOSIS — R262 Difficulty in walking, not elsewhere classified: Secondary | ICD-10-CM | POA: Diagnosis not present

## 2019-10-02 DIAGNOSIS — I2782 Chronic pulmonary embolism: Secondary | ICD-10-CM | POA: Diagnosis not present

## 2019-10-02 DIAGNOSIS — R278 Other lack of coordination: Secondary | ICD-10-CM | POA: Diagnosis not present

## 2019-10-02 DIAGNOSIS — I2692 Saddle embolus of pulmonary artery without acute cor pulmonale: Secondary | ICD-10-CM | POA: Diagnosis not present

## 2019-10-08 DIAGNOSIS — L03031 Cellulitis of right toe: Secondary | ICD-10-CM | POA: Diagnosis not present

## 2019-10-08 DIAGNOSIS — M2041 Other hammer toe(s) (acquired), right foot: Secondary | ICD-10-CM | POA: Diagnosis not present

## 2019-10-09 ENCOUNTER — Telehealth: Payer: Self-pay | Admitting: Internal Medicine

## 2019-10-09 NOTE — Telephone Encounter (Signed)
Spoke to  pt's wife .

## 2019-10-09 NOTE — Telephone Encounter (Signed)
Pt's wife called because pt is having a tooth extracted on 10/15/19 and the dentist wants to know if he can stop taking Eliquis 3 days before surgery?

## 2019-10-09 NOTE — Telephone Encounter (Signed)
Yes he can----but usually it is only necessary to stop for 2 days (that is the case even for major surgery)

## 2019-10-10 ENCOUNTER — Other Ambulatory Visit: Payer: PPO | Admitting: Adult Health Nurse Practitioner

## 2019-10-10 ENCOUNTER — Other Ambulatory Visit: Payer: Self-pay

## 2019-10-10 DIAGNOSIS — G20 Parkinson's disease: Secondary | ICD-10-CM

## 2019-10-10 DIAGNOSIS — Z515 Encounter for palliative care: Secondary | ICD-10-CM | POA: Diagnosis not present

## 2019-10-10 NOTE — Progress Notes (Signed)
[facility] Consult Note Telephone: [phone]  Fax: [phone]  PATIENT NAME: Walter [name] DOB: 10/31/1931 MRN: [id number]  PRIMARY CARE PROVIDER:   Venia Carbon, MD  REFERRING PROVIDER:  Venia Carbon, MD [place],  [place] [id number]  RESPONSIBLE PARTY:   Wife, Walter [name] [phone]     RECOMMENDATIONS and PLAN:  1.  Advanced care planning. Patient is a DNR  2.  Parkinson's disease.  Patient had recent appointment with neurology and sinemet was changed to parcopa, a form that dissolves on the tongue as the patient had been chewing his sinemet and not getting enough of his medication.  Patient and wife both feel as if this has made a difference and he is not sleeping as much and he states that he does not feel as weak. Denies recent falls.  He is working with PT. Continue PT as ordered.  Continue follow up and recommendations by neurology.  3.  Functional status.  Patient able to walk with walker.  Requires assistance with ADLs.  Is able to feed himself.  Denies falls.  Has aide that comes in to help with personal care in the mornings.  No changes in functional status since last visit.  Continue working with PT as ordered  4.  Nutritional status.  Patient had lost weight and was down to 150 pounds.  On 10/08/19 he is back up to 169 pounds with BMI of 24.39.    Palliative will continue to monitor for symptom management/decline and make recommendations as needed.  Next appointment is in 6 weeks.  Encouraged to call sooner with any concerns.    I spent 60 minutes providing this consultation,  from 10:30 to 11:30 including time spent with patient/family, chart review, provider coordination, documentation. More than 50% of the time in this consultation was spent coordinating communication.   HISTORY OF PRESENT ILLNESS:  Walter [name] is a 86 y.o. year old male with multiple medical problems including Parkinson's  disease, HTN, BPH, h/o oral cancer, hypothyroidism. Palliative Care was asked to help address goals of care.   CODE STATUS: DNR  PPS: 40% HOSPICE ELIGIBILITY/DIAGNOSIS: TBD  PHYSICAL EXAM:  BP 146/78  HR 62  O2 97% on RA General: NAD, frail appearing, thin Cardiovascular: regular rate and rhythm Pulmonary:lung sounds clear; normal respiratory effort Abdomen: soft, nontender, + bowel sounds GU: no suprapubic tenderness Extremities:trace edema of feet and ankles, no joint deformities Neurological: Weakness; A&O to person and place  PAST MEDICAL HISTORY:  Past Medical History:  Diagnosis Date   Abdominal aortic aneurysm (AAA) ([place])    ARF (acute renal failure) ([place]) 2011   after TKR   BPH (benign prostatic hypertrophy)    C. difficile diarrhea    DVT (deep venous thrombosis) ([place]) 1998   after left knee arthroscopy   Hx of colonoscopy 2000   Hypertension    Hypothyroid 2007   postop from thyroidectomy (cancer scare)   Kidney stones    recurrent   Oral cancer ([place]) 2004   graft from left thigh   Osteoarthritis of both knees    Parkinson's disease ([place])    Pulmonary embolism ([place])    Tendonitis 2012   from quinolone   Toe amputation status 2010    SOCIAL HX:  Social History   Tobacco Use   Smoking status: Former Smoker    Types: Cigars    Quit date: 04/27/2002    Years since quitting: 17.4  Smokeless tobacco: Never Used  Substance Use Topics   Alcohol use: Not Currently    Alcohol/week: 0.0 standard drinks    ALLERGIES:  Allergies  Allergen Reactions   Quinolones     tendonitis     PERTINENT MEDICATIONS:  Outpatient Encounter Medications as of 10/10/2019  Medication Sig   acetaminophen (TYLENOL) 325 MG tablet Take 2 tablets (650 mg total) by mouth every 6 (six) hours as needed for mild pain or moderate pain (or Fever >/= 101).   apixaban (ELIQUIS) 2.5 MG TABS tablet Take 1 tablet (2.5 mg total) by mouth 2 (two) times daily.    carbidopa-levodopa (PARCOPA) 25-100 MG disintegrating tablet 2 tablets at 8 AM, 1 tablet at 11 AM, 2 tablets at 2 PM, 1 tablet at 6 PM   cephALEXin (KEFLEX) 500 MG capsule Take 1 capsule (500 mg total) by mouth 3 (three) times daily.   DENTA 5000 PLUS 1.1 % CREA dental cream Place 1 application onto teeth daily.   levothyroxine (SYNTHROID) 112 MCG tablet Take 1 tablet (112 mcg total) by mouth daily before breakfast.   No facility-administered encounter medications on file as of 10/10/2019.       Walter [name] Jenetta Downer, NP

## 2019-10-29 ENCOUNTER — Other Ambulatory Visit: Payer: Self-pay

## 2019-10-29 ENCOUNTER — Ambulatory Visit (INDEPENDENT_AMBULATORY_CARE_PROVIDER_SITE_OTHER): Payer: PPO | Admitting: Psychology

## 2019-10-29 ENCOUNTER — Encounter: Payer: Self-pay | Admitting: Psychology

## 2019-10-29 ENCOUNTER — Ambulatory Visit: Payer: PPO | Admitting: Psychology

## 2019-10-29 DIAGNOSIS — F0280 Dementia in other diseases classified elsewhere without behavioral disturbance: Secondary | ICD-10-CM | POA: Diagnosis not present

## 2019-10-29 DIAGNOSIS — G20 Parkinson's disease: Secondary | ICD-10-CM | POA: Diagnosis not present

## 2019-10-29 DIAGNOSIS — R4189 Other symptoms and signs involving cognitive functions and awareness: Secondary | ICD-10-CM

## 2019-10-29 NOTE — Progress Notes (Signed)
   Psychometrician Note   Cognitive testing was administered to Walter [name] by Milana Kidney, B.S. (psychometrist) under the supervision of Dr. Christia Reading, Ph.D., licensed psychologist on 10/29/19. Walter [name] did not appear overtly distressed by the testing session per behavioral observation or responses across self-report questionnaires. Dr. Christia Reading, Ph.D. checked in with Walter [name] as needed to manage any distress related to testing procedures (if applicable). Rest breaks were offered.    The battery of tests administered was selected by Dr. Christia Reading, Ph.D. with consideration to Walter [name] current level of functioning, the nature of his symptoms, emotional and behavioral responses during interview, level of literacy, observed level of motivation/effort, and the nature of the referral question. This battery was communicated to the psychometrist. Communication between Dr. Christia Reading, Ph.D. and the psychometrist was ongoing throughout the evaluation and Dr. Christia Reading, Ph.D. was immediately accessible at all times. Dr. Christia Reading, Ph.D. provided supervision to the psychometrist on the date of this service to the extent necessary to assure the quality of all services provided.    Walter [name] will return within approximately 1-2 weeks for an interactive feedback session with Dr. Melvyn Novas at which time his test performances, clinical impressions, and treatment recommendations will be reviewed in detail. Walter [name] understands he can contact our office should he require our assistance before this time.  A total of 140 minutes of billable time were spent face-to-face with Walter [name] by the psychometrist. This includes both test administration and scoring time. Billing for these services is reflected in the clinical report generated by Dr. Christia Reading, Ph.D..  This note reflects time spent with the psychometrician and does not include test scores or any clinical interpretations made by  Dr. Melvyn Novas. The full report will follow in a separate note.

## 2019-10-29 NOTE — Progress Notes (Signed)
NEUROPSYCHOLOGICAL EVALUATION [place]. [facility] Department of Neurology  Date of Evaluation: October 29, 2019  Reason for Referral:   Walter [name] is a 86 y.o. right-handed Caucasian male referred by Alonza Bogus, D.O., to characterize his current cognitive functioning and assist with diagnostic clarity and treatment planning in the context of subjective cognitive decline and concerns regarding Parkinson's disease dementia.   Assessment and Plan:   Clinical Impression(s): Walter [name] pattern of performance is suggestive of somewhat diffuse cognitive impairment with most prominent areas of dysfunction surrounding executive functioning and visuospatial abilities. Additional weaknesses were noted across receptive language and verbal fluency. Performance variability was exhibited across processing speed, confrontation naming, and learning and memory. Performance was appropriate across attention/concentration. Walter [name] and his son acknowledged ongoing difficulties completing instrumental activities of daily living (ADLs), particularly surrounding medication and financial management, as well as driving. This, coupled with evidence for significant cognitive dysfunction described above, suggests that he meets criteria for a Major Neurocognitive Disorder (formerly "dementia") at the present time.  Regarding etiology, the most likely culprit for ongoing cognitive decline is the presence of a Parkinson's disease dementia presentation. His medical history, report that medications have led to some noticeable improvements in motor symptoms, and the results of current testing are all consistent with this presentation. It is worth noting that prominent deficits with executive functioning and visuospatial abilities is also a hallmark sign of Lewy body dementia. While current test scores would be consistent with this illness, Walter [name] and his son denied behavioral characteristics which are commonly  associated with this condition, namely fluctuations in alertness and fully-formed visual hallucinations. He does seem to have some REM sleep disturbances. However, these can also occur in Parkinson's disease dementia presentations and were said to have been improved following recent medication adjustments. Overall, Lewy body dementia seems less likely at the present time. Frontal and parietal dysfunction can also be concerning for corticobasal degeneration. However, he did not report strong asymmetric parkinsonian symptoms or frequent involuntary movements. Myoclonic jerking also appeared to be specific to in or around sleep, also making this less likely at the present time. There was evidence for memory storage capabilities, especially across verbal measures. As such, Alzheimer's disease also appears unlikely presently. Continued medical monitoring will be important moving forward.  Recommendations: A repeat neuropsychological evaluation in 12-18 months (or sooner if functional decline is noted) is recommended to assess the trajectory of future cognitive decline should it occur. This will also aid in future efforts towards improved diagnostic clarity.  If there is a desire to attempt for further diagnostic clarity, additional neuroimaging could be considered, namely an updated brain MRI as his previous scan was completed in 2017. Walter [name] should discuss the pros and cons of these procedures with Dr. Carles Collet.   Should there be a progression of his current deficits over time, Walter [name] is unlikely to regain any independent living skills lost. Therefore, it is recommended that he remain as involved as possible in all aspects of household chores, finances, and medication management, with supervision to ensure adequate performance. He will likely benefit from the establishment and maintenance of a routine in order to maximize his functional abilities over time.  It will be important for Walter [name] to have another person  with him when in situations where he may need to process information, weigh the pros and cons of different options, and make decisions, in order to ensure that he fully understands and recalls all information to be  considered.  If not already done, Walter [name] and his family may want to discuss his wishes regarding durable power of attorney and medical decision making, so that he can have input into these choices. Additionally, they may wish to discuss future plans for caretaking and seek out community options for in home/residential care should they become necessary.  Walter [name] is encouraged to attend to lifestyle factors for brain health (e.g., regular physical exercise, good nutrition habits, regular participation in cognitively-stimulating activities, and general stress management techniques), which are likely to have benefits for both emotional adjustment and cognition. Optimal control of vascular risk factors (including safe cardiovascular exercise and adherence to dietary recommendations) is encouraged.   When learning new information, he would benefit from information being broken up into small, manageable pieces. He may also find it helpful to articulate the material in his own words and in a context to promote encoding at the onset of a new task. This material may need to be repeated multiple times to promote encoding.  Memory can be improved using internal strategies such as rehearsal, repetition, chunking, mnemonics, association, and imagery. External strategies such as written notes in a consistently used memory journal, visual and nonverbal auditory cues such as a calendar on the refrigerator or appointments with alarm, such as on a cell phone, can also help maximize recall.    To address problems with processing speed, he may wish to consider:   -Ensuring that he is alerted when essential material or instructions are being presented   -Adjusting the speed at which new information is presented    -Allowing for more time in comprehending, processing, and responding in conversation  To address problems with executive dysfunction, he may wish to consider:   -Avoiding external distractions when needing to concentrate   -Limiting exposure to fast paced environments with multiple sensory demands   -Writing down complicated information and using checklists   -Attempting and completing one task at a time (i.e., no multi-tasking)   -Verbalizing aloud each step of a task to maintain focus   -Taking frequent breaks during the completion of steps/tasks to avoid fatigue   -Reducing the amount of information considered at one time   Review of Records:   Walter [name] was seen by [facility] Wells Guiles Tat, D.O.) on 09/17/2019 for follow-up of Parkinson's disease. Primary care was noted to have increased his levodopa from carbidopa/levodopa 25/100, 1 tablet four times per day to carbidopa/levodopa 25/250, 1 tablet three times per day. Dr. Carles Collet reported receiving a note from his palliative care nurse practitioner stating that Walter [name] was more sleepy and had speech difficulties. When asked about these symptoms, Walter [name] and his wife described a lack of energy (before the medication change), as well as cognitive changes. Walter [name] reported that medication changes have been helpful overall. However, his wife appeared less convinced of improved motor symptoms. She also reported worsening cognitive symptoms. Due to symptoms of dysphagia and difficulty swallowing levodopa medications even after chewing and mixing with applesauce, Dr. Carles Collet changed to parcopa. Dr. Carles Collet also expressed some concerns surrounding cognitive decline and a Parkinson's disease dementia presentation. Ultimately, Walter [name] was referred for a comprehensive neuropsychological evaluation to characterize his cognitive abilities and to assist with diagnostic clarity and treatment planning.   Brain MRI on 12/03/2015 revealed atrophy and chronic small vessel  disease of the cerebral hemispheric white matter. No definite substantia nigra volume loss was noted. Head CT on 11/09/2018 revealed atrophy with chronic small vessel ischemic  disease.   Past Medical History:  Diagnosis Date  . Abdominal aortic aneurysm (AAA)   . Actinic keratosis 07/31/2013  . ARF (acute renal failure) 2011   after TKR  . Benign prostatic hyperplasia   . Bladder cancer 03/26/2019  . Bradycardia 09/26/2019  . C. difficile diarrhea   . CKD (chronic kidney disease) 03/05/2016   stage 3, GFR 30-59 ml/min  . DVT (deep venous thrombosis) 1998   after left knee arthroscopy  . Dysphagia 10/02/2018  . History of colonoscopy 2000  . Hypertension   . Hypothyroidism 2007   postop from thyroidectomy (cancer scare)   . Kidney stones    recurrent  . Oral cancer 2004   graft from left thigh  . Osteoarthritis of both knees   . Parkinson's disease   . Pulmonary embolism   . RBD (REM behavioral disorder) 10/24/2017  . 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 Tendonitis 2012   from quinolone  . Toe amputation status 2010  . Vitamin B12 deficiency 09/09/2014    Past Surgical History:  Procedure Laterality Date  . APPENDECTOMY    . CHOLECYSTECTOMY    . CYSTOSCOPY W/ RETROGRADES Bilateral 03/18/2019   Procedure: CYSTOSCOPY WITH RETROGRADE PYELOGRAM;  Surgeon: Hollice Espy, MD;  Location: [facility] ORS;  Service: Urology;  Laterality: Bilateral;  . CYSTOSCOPY/URETEROSCOPY/HOLMIUM LASER/STENT PLACEMENT Right 09/13/2017   Procedure: CYSTOSCOPY/URETEROSCOPY/HOLMIUM LASER/STENT PLACEMENT;  Surgeon: Hollice Espy, MD;  Location: [facility] ORS;  Service: Urology;  Laterality: Right;  . HERNIA REPAIR    . INTRAMEDULLARY (IM) NAIL INTERTROCHANTERIC Right 06/13/2017   Procedure: INTRAMEDULLARY (IM) NAIL INTERTROCHANTRIC;  Surgeon: Hessie Knows, MD;  Location: [facility] ORS;  Service: Orthopedics;  Laterality: Right;  . LITHOTRIPSY  2009/2010   then stent and cystoscopic  removal 2014  . Oral cancer removed Right 2004  . REPAIR ZENKER'S DIVERTICULA  2013  . THYROIDECTOMY  2007  . TOE AMPUTATION Right 2010   badly overlapping other toe  . TOTAL KNEE ARTHROPLASTY Left   . TRANSURETHRAL RESECTION OF BLADDER TUMOR WITH MITOMYCIN-C N/A 03/18/2019   Procedure: TRANSURETHRAL RESECTION OF BLADDER TUMOR WITH Gemcitabine;  Surgeon: Hollice Espy, MD;  Location: [facility] OR[facility];  Service: Urology;  Laterality: N/A;  . TRANSURETHRAL RESECTION OF PROSTATE  2011  . UMBILICAL HERNIA REPAIR  2007    Current Outpatient Medications:  .  acetaminophen (TYLENOL) 325 MG tablet, Take 2 tablets (650 mg total) by mouth every 6 (six) hours as needed for mild pain or moderate pain (or Fever >/= 101)., Disp: , Rfl:  .  apixaban (ELIQUIS) 2.5 MG TABS tablet, Take 1 tablet (2.5 mg total) by mouth 2 (two) times daily., Disp: 180 tablet, Rfl: 3 .  carbidopa-levodopa (PARCOPA) 25-100 MG disintegrating tablet, 2 tablets at 8 AM, 1 tablet at 11 AM, 2 tablets at 2 PM, 1 tablet at 6 PM, Disp: 540 tablet, Rfl: 1 .  cephALEXin (KEFLEX) 500 MG capsule, Take 1 capsule (500 mg total) by mouth 3 (three) times daily., Disp: 21 capsule, Rfl: 0 .  DENTA 5000 PLUS 1.1 % CREA dental cream, Place 1 application onto teeth daily., Disp: , Rfl:  .  levothyroxine (SYNTHROID) 112 MCG tablet, Take 1 tablet (112 mcg total) by mouth daily before breakfast., Disp: 90 tablet, Rfl: 3  Clinical Interview:   Cognitive Symptoms:  Decreased short-term memory: Endorsed. Walter [name] described the presence of memory lapses and generalized difficulties with short-term memory. He further reported trouble recalling the details of previous conversations.  Difficulties were said to not be present all the time but do appear to have worsened over the past few years. He admitted that his wife seems to have more pronounced concerns relative to his own.  Decreased long-term memory: Denied. Decreased attention/concentration: Endorsed. He  reported ongoing attentional lapses and some difficulties with sustained attention and distractibility. His son reported that he seems to come in and out of lengthier conversations, especially when trying to follow conversations being had by others in his immediate surroundings.  Reduced processing speed: Endorsed. These difficulties were said to have worsened over the past few years. His son described instances where Walter [name] will seem to be processing information more slowly than what is typical.  Difficulties with executive functions: Endorsed. He reported some trouble with organization and indecision. Trouble with impulsivity or overt personality changes were denied. Trouble using good judgment was also denied.  Difficulties with emotion regulation: Denied. Difficulties with receptive language: Endorsed. Difficulties were generally due to deficits in processing speed and attentional lapses.  Difficulties with word finding: Denied. Decreased visuoperceptual ability: Denied.  Difficulties completing ADLs: Endorsed. His wife provides significant assistance regarding medication and financial management. His son also provides some assistance with the latter. Walter [name] does not drive. He reported making a decision to stop driving a few years prior due to cognitive concerns.   Additional Medical History: History of traumatic brain injury/concussion: Unclear. Approximately 2-3 years prior, he described an instance where he was acting out a dream and flung himself over his bed railing and onto the floor. He reported hitting a nearby dresser with his head, ultimately requiring 12 stiches. It was unclear if he sustained a concussion from this event and persisting post-concussion symptoms were not reported. In addition to this, he reported a history of playing football while younger.  History of stroke: Denied. History of seizure activity: Denied. History of known exposure to toxins: Denied. Symptoms of chronic  pain: Denied. Experience of frequent headaches/migraines: Denied. Frequent instances of dizziness/vertigo: Denied.  Sensory changes: He reported emerging difficulties with visual acuity over the past year or so where it has become very difficult for him to read the newspaper due to blurred vision. These difficulties were said to be present even while wearing his glasses. He reported having an appointment to see his eye doctor in the near future. He largely denied other sensory difficulties surrounding heading, smell, or taste. Regarding the latter, he did describe significant swallowing difficulties causing him to puree most of his food options.  Balance/coordination difficulties: Denied. He reported that he has "no major balance problems" currently and that he uses his rolling walker consistently. Approximately 2.5 years ago he fell on the sidewalk outside his residence and broke his hip. However, more recent falls were denied.  Other motor difficulties: He largely denied the presence of significant tremulous behaviors. However, he did describe significant rigidity in his left hand and noting that he seems to be "losing control." He also reported gait abnormalities in that he exhibits a slowed, shuffling gait. Myoclonic jerking behaviors were further reported. However, these were said to be rare and generally occur while asleep, occasionally causing him to wake up in the middle of the night.   Sleep History: Estimated hours obtained each night: 8 hours. Difficulties falling asleep: Denied. Difficulties staying asleep: He reported waking approximately three times per night in order to use the restroom.  Feels rested and refreshed upon awakening: Endorsed. However, his son did report that he commonly takes a  nap or two throughout the day.   History of snoring: Denied. History of waking up gasping for air: Denied. Witnessed breath cessation while asleep: Denied.  History of vivid dreaming:  Endorsed. Excessive movement while asleep: Endorsed. Instances of acting out his dreams: Endorsed. He reported a history of having "violent" dreams and often acting or reacting to dream content. He has installed bed rails to prevent him from falling out of bed during these actions; however, this is not always successful (see above example). He reported that these actions have seemed to improve (i.e., become less frequent) since his medications were adjusted.   Psychiatric/Behavioral Health History: Depression: Denied. He described his current mood as "pretty good" and denied any previous mental health concerns or diagnoses. Current or remote suicidal ideation, intent, or plan was denied.  Anxiety: Denied. Mania: Denied. Trauma History: Denied. Visual/auditory hallucinations: Denied. Delusional thoughts: Denied.  Tobacco: Denied. Alcohol: He reported stopping alcohol consumption following his fall which broke his hip approximately 2.5 years prior. Alcohol was not said to be a contributor to that fall. He denied a history of problematic alcohol abuse or dependence.  Recreational drugs: Denied. Caffeine: Denied.   Family History: Problem Relation Age of Onset  . Dementia Mother   . Stroke Father   . Pulmonary disease Brother   . Heart disease Neg Hx   . Diabetes Neg Hx    This information was confirmed by Mr. Kiper.  Academic/Vocational History: Highest level of educational attainment: 16 years. He graduated from high school and earned a Dietitian from the [employer]. He described himself as an average (A/B) student in academic settings. No relative weaknesses were identified.   History of developmental delay: Denied. History of grade repetition: Denied. Enrollment in special education courses: Denied. History of LD/ADHD: Denied.  Employment: Retired. He previously worked in a Surveyor, minerals positions, largely in Chartered certified accountant capacities.    Evaluation Results:   Behavioral Observations: Mr. Mounger was accompanied by his son, arrived to his appointment on time, and was appropriately dressed and groomed. He appeared alert and oriented. He ambulated slowly with the assistance of a rolling walker and exhibited a shuffling gait. He operated this device effectively, did not strike any objects, and did not exhibit any frank balance instability. Gross motor functioning appeared intact upon informal observation and no abnormal movements (e.g., tremors) were noted. However, he did appear to exhibit some rigidity, especially in his left hand/arm. His affect was generally relaxed and positive, but did range appropriately given the subject being discussed during the clinical interview or the task at hand during testing procedures. He exhibited a mild delay when responding to questions asked of him due to an assumed delay in information processing. Spontaneous speech was fluent and word finding difficulties were not observed during the clinical interview. Thought processes were coherent, organized, and normal in content. Insight into his cognitive difficulties appeared adequate. During testing, sustained attention was appropriate. Task engagement was adequate and he persisted when challenged. He had significant trouble comprehending task instructions across numerous tests (D-KEFS Color-Word, Matrix Reasoning, TMT A and B). He also would commonly ask the psychometrist to "say that again" once she read him the instructions. This was likely due to a combination of hearing loss and diminished information processing. He did fatigue as the evaluation progressed. Overall, Mr. Mccleery was cooperative with the clinical interview and subsequent testing procedures.   Adequacy of Effort: The validity of neuropsychological testing is limited by the extent  to which the individual being tested may be assumed to have exerted adequate effort during testing. Mr. Downs expressed his  intention to perform to the best of his abilities and exhibited adequate task engagement and persistence. Scores across stand-alone and embedded performance validity measures were within expectation. As such, the results of the current evaluation are believed to be a valid representation of Mr. Garrido current cognitive functioning.  Test Results: Mr. Anastasi was disoriented at the time of the current evaluation. He was unable to recall his phone number. He also incorrectly stated the current year ("2001"), date, time, and location.   Intellectual abilities based upon educational and vocational attainment were estimated to be in the average range. Premorbid abilities were estimated to be within the below average range based upon a single-word reading test.   Processing speed was exceptionally low to below average. Basic attention was below average. More complex attention (e.g., working memory) was also below average. Executive functioning was exceptionally low and represented a primary area of weakness across the current evaluation. Performance on a task assessing safety and judgment was also exceptionally low.  Assessed receptive language abilities were exceptionally low. He appeared to have difficulties comprehending items which required sequential responding (i.e., point to X after pointing to Y), as well as those with abnormal sentence structure. Assessed expressive language was variable. Verbal fluency was well below average while confrontation naming was exceptionally low to average.    Assessed visuospatial/visuoconstructional abilities were exceptionally low, also representing a primary area of weakness across testing. Points were lost on his drawing of a clock due to mild spatial abnormalities in numerical placement and poor hand representation. Points were lost on his drawing of a complex figure due to him not enclosing the outer rectangle, poor spatial properties of internal aspects, and omission of  several aspects altogether.    Learning (i.e., encoding) of novel verbal information was well below average to average. Spontaneous delayed recall (i.e., retrieval) of previously learned information was exceptionally low to average. Retention rates were 89% across a story learning task, 0% across a list learning task, and 0% across a complex figure drawing task. Performance across recognition tasks was exceptionally low across a visual task but average to above average across verbal measures, suggesting some evidence for information consolidation.   Results of emotional screening instruments suggested that recent symptoms of generalized anxiety were in the mild range, while symptoms of depression were within normal limits. A screening instrument assessing recent sleep quality suggested the presence of minimal sleep dysfunction.  Tables of Scores:   Note: This summary of test scores accompanies the interpretive report and should not be considered in isolation without reference to the appropriate sections in the text. Descriptors are based on appropriate normative data and may be adjusted based on clinical judgment. The terms "impaired" and "within normal limits (WNL)" are used when a more specific level of functioning cannot be determined.       Effort Testing:   DESCRIPTOR       Dot Counting Test: --- --- Within Expectation  RBANS Effort Index: --- --- Within Expectation  WAIS-IV Reliable Digit Span: --- --- Below Expectation       Orientation:      Raw Score Percentile   NAB Orientation, Form 1 19/29 --- ---       Cognitive Screening:           Raw Score Percentile   SLUMS: 12/30 --- ---       RBANS,  Form A: Standard Score/ Scaled Score Percentile   Total Score 66 1 Exceptionally Low  Immediate Memory 81 10 Below Average    List Learning 4 2 Well Below Average    Story Memory 9 37 Average  Visuospatial/Constructional 56 <1 Exceptionally Low    Figure Copy 1 <1 Exceptionally Low     Line Orientation 7/20 <2 Exceptionally Low  Language 86 18 Below Average    Picture Naming 9/10 26-50 Average    Semantic Fluency 4 2 Well Below Average  Attention 56 <1 Exceptionally Low    Digit Span 6 9 Below Average    Coding 1 <1 Exceptionally Low  Delayed Memory 88 21 Below Average    List Recall 0/10 <2 Exceptionally Low    List Recognition 19/20 26-50 Average    Story Recall 10 50 Average    Story Recognition 12/12 87+ Above Average    Figure Recall 1 <1 Exceptionally Low    Figure Recognition 0/8 <1 Exceptionally Low       Intellectual Functioning:           Standard Score Percentile   Test of Premorbid Functioning: 89 23 Below Average       Attention/Executive Function:          Trail Making Test (TMT): Raw Score (T Score) Percentile     Part A Discontinued,  5 errors --- Impaired    Part B Discontinued --- Impaired         Scaled Score Percentile   WAIS-IV Digit Span: 6 9 Below Average    Forward 7 16 Below Average    Backward 6 9 Below Average    Sequencing 7 16 Below Average       D-KEFS Color-Word Interference Test: Raw Score (Scaled Score) Percentile     Color Naming 47 secs. (6) 9 Below Average    Word Reading 37 secs. (5) 5 Well Below Average    Inhibition Discontinued --- Impaired    Inhibition/Switching Discontinued --- Impaired       D-KEFS 20 Questions Test: Scaled Score Percentile     Total Weighted Achievement Score Discontinued (comprehension) --- Impaired    Initial Abstraction Score --- --- ---       NAB Executive Functions Module, Form 1: T Score Percentile     Judgment 23 <1 Exceptionally Low       Language:          Verbal Fluency Test: Raw Score (Scaled Score) Percentile     Phonemic Fluency (CFL) 14 (5) 5 Well Below Average    Category Fluency 22 (5) 5 Well Below Average  *Based on [employer] Older Normative Studies (MOANS)          NAB Language Module, Form 1: T Score Percentile     Auditory Comprehension 24 <1 Exceptionally Low     Naming 23/31 (26) 1 Exceptionally Low       Visuospatial/Visuoconstruction:      Raw Score Percentile   Clock Drawing: 6/10 --- Impaired       NAB Spatial Module, Form 1: T Score Percentile     Visual Discrimination 19 <1 Exceptionally Low        Scaled Score Percentile   WAIS-IV Matrix Reasoning: Discontinued (comprehension) --- Impaired       Mood and Personality:      Raw Score Percentile   Geriatric Depression Scale: 9 --- Within Normal Limits  Geriatric Anxiety Scale: 14 --- Mild    Somatic 6 ---  Mild    Cognitive 6 --- Mild    Affective 2 --- Minimal       Additional Questionnaires:      Raw Score Percentile   PROMIS Sleep Disturbance Questionnaire: 15 --- None to Slight   Informed Consent and Coding/Compliance:   Mr. Stickels was provided with a verbal description of the nature and purpose of the present neuropsychological evaluation. Also reviewed were the foreseeable risks and/or discomforts and benefits of the procedure, limits of confidentiality, and mandatory reporting requirements of this provider. The patient was given the opportunity to ask questions and receive answers about the evaluation. Oral consent to participate was provided by the patient.   This evaluation was conducted by Christia Reading, Ph.D., licensed clinical neuropsychologist. Mr. Laski completed a comprehensive clinical interview with Dr. Melvyn Novas, billed as one unit [phone], and 140 minutes of cognitive testing and scoring, billed as one unit [id number] and four additional units 96139. Psychometrist Milana Kidney, B.S., assisted Dr. Melvyn Novas with test administration and scoring procedures. As a separate and discrete service, Dr. Melvyn Novas spent a total of 160 minutes in interpretation and report writing billed as one unit [id number] and two units 96133.

## 2019-10-30 ENCOUNTER — Telehealth: Payer: Self-pay

## 2019-10-30 ENCOUNTER — Encounter: Payer: Self-pay | Admitting: Psychology

## 2019-10-30 DIAGNOSIS — F0280 Dementia in other diseases classified elsewhere without behavioral disturbance: Secondary | ICD-10-CM

## 2019-10-30 DIAGNOSIS — G20 Parkinson's disease: Secondary | ICD-10-CM

## 2019-10-30 DIAGNOSIS — G20A1 Parkinson's disease without dyskinesia, without mention of fluctuations: Secondary | ICD-10-CM

## 2019-10-30 HISTORY — DX: Dementia in other diseases classified elsewhere, unspecified severity, without behavioral disturbance, psychotic disturbance, mood disturbance, and anxiety: F02.80

## 2019-10-30 HISTORY — DX: Parkinson's disease without dyskinesia, without mention of fluctuations: G20.A1

## 2019-10-30 HISTORY — DX: Parkinson's disease: G20

## 2019-10-30 NOTE — Telephone Encounter (Signed)
Pt had visit with neurologist on 10/29/19. Sending note to Dr Silvio Pate who is out of office as PCP and Dr Glori Bickers who is in office today.

## 2019-10-30 NOTE — Telephone Encounter (Signed)
Spoke with wife and she said pt is only drinking a little wife is giving boost because he will drink about one a day, he is drinking water but only taking tiny sips at a time, no signs of dehydration wife said. Pt didn't know why Dr. Glori Bickers would want her to check in with neuro because she doesn't believe this is part of his parkinson ds. When I asked about a feeding tube wife said she doesn't want to answer anymore of Dr. Marliss Coots questions and wants Dr. Silvio Pate to call them tomorrow when he returns, wife said Dr. Silvio Pate knows the pt and Dr. Glori Bickers doesn't so she will wait until Dr. Silvio Pate can call her tomorrow afternoon,   FYI to Dr. Glori Bickers and Dr. Silvio Pate

## 2019-10-30 NOTE — Telephone Encounter (Signed)
Has he been able to drink fluids? Any signs of dehydration?  Have they let neurology know?  Is he /family interested in pursuing tube feeds at this point?   It looks like palliative care has assessed him in the past

## 2019-10-30 NOTE — Telephone Encounter (Signed)
[facility] Day - Client TELEPHONE ADVICE RECORD [facility] Patient Name: Walter [name] Gender: Male DOB: 1932-01-31 Age: 86 Y 36 M Return Phone Number: [phone] (Primary) Address: City/State/Zip: [place] [address] Client [facility] - Client Client Site [facility] Physician Viviana Simpler- MD Contact Type Call Who Is Calling Patient / Member / Family / Caregiver Call Type Triage / Clinical Caller Name Blaze Nylund Relationship To Patient Spouse Return Phone Number [phone] (Primary) Chief Complaint Swallowing Difficulty Reason for Call Symptomatic / Request for Health Information Initial Comment caller states her husband is having trouble swallowing since Monday Translation No Nurse Assessment Nurse: Gildardo Pounds, RN, Amy Date/Time Eilene Ghazi Time): 10/30/2019 1:30:06 PM Confirm and document reason for call. If symptomatic, describe symptoms. ---Caller states her husband has been having trouble swallowing since Monday. This is not a new problem. He has Parkinsons. He has a Zenkers Diverticula. He has been on Pureed foods for a year. He started choking on dinner Monday night. Since then he has pretty much not eaten. It has been a year since it has been this critical. They have seen all the specialists & the next thing they recommend is a tube feed. He is sipping water. Yesterday he had a Boost. Has the patient had close contact with a person known or suspected to have the novel coronavirus illness OR traveled / lives in area with major community spread (including international travel) in the last 14 days from the onset of symptoms? * If Asymptomatic, screen for exposure and travel within the last 14 days. ---No Does the patient have any new or worsening symptoms? ---Yes Will a triage be completed? ---Yes Related visit to physician within the last 2 weeks? ---No Does the PT have any chronic  conditions? (i.e. diabetes, asthma, this includes High risk factors for pregnancy, etc.) ---Yes List chronic conditions. ---Parkinsons Is this a behavioral health or substance abuse call? ---No PLEASE NOTE: All timestamps contained within this report are represented as [place] Standard Time. CONFIDENTIALTY NOTICE: This fax transmission is intended only for the addressee. It contains information that is legally privileged, confidential or otherwise protected from use or disclosure. If you are not the intended recipient, you are strictly prohibited from reviewing, disclosing, copying using or disseminating any of this information or taking any action in reliance on or regarding this information. If you have received this fax in error, please notify us immediately by telephone so that we can arrange for its return to [place]. Phone: [phone], Toll-Free: [phone], Fax: [phone] Page: 2 of 2 Call Id: [id number] Guidelines Guideline Title Affirmed Question Affirmed Notes Nurse Date/Time Eilene Ghazi Time) Swallowing Difficulty [1] Coughing spells AND [2] occur during eating/ feedings or within 2 hours Lovelace, RN, Amy 10/30/2019 1:33:31 PM Disp. Time Eilene Ghazi Time) Disposition Final User 10/30/2019 1:37:14 PM Call PCP Now Yes Lovelace, RN, Amy Caller Disagree/Comply Comply Caller Understands Yes PreDisposition InappropriateToAsk Care Advice Given Per Guideline CALL PCP NOW: * You need to discuss this with your doctor (or NP/PA). * I'll page the on-call provider now. If you haven't heard from the provider (or me) within 30 minutes, call again. CALL BACK IF: * You become worse. CARE ADVICE given per Swallowed Difficulty (Adult) guideline. Comments User: Wayne Sever, RN Date/Time Eilene Ghazi Time): 10/30/2019 1:42:04 PM Spoke to [place] at the office. Notified her of patient condition & Outcome to Call PCP. She stated she will give the fax that will be sent to the  office to the nurse to see what the  physician recommends. Office nurse or PCP needs to call patient back with instructions.

## 2019-10-31 NOTE — Telephone Encounter (Signed)
PC with wife May have started with slightly more normal food that he tried  Discussed that hopefully this setback with be limited to a few days Needs to maintain hydration--increase boost as tolerated but continue water Don't worry about food for now--till things settle down

## 2019-11-05 ENCOUNTER — Telehealth: Payer: Self-pay | Admitting: *Deleted

## 2019-11-05 NOTE — Telephone Encounter (Signed)
Patient's wife notified as instructed by telephone and verbalized understanding. 

## 2019-11-05 NOTE — Telephone Encounter (Signed)
Patient's wife left a voicemail stating that her husband had diarrhea yesterday and she is concerned that he may have C-diff again. Called and spoke to patient's wife and was advised that her husband had one episode of diarrhea yesterday and not any since.  Patient's wife stated that her husband has not had any abdominal pain or other GI symptoms. Patient's wife stated that he has been taking Miralax and she stopped giving him that yesterday. Patient's wife stated she has been giving him nutritional supplements and he has been tolerating them. Patient's wife stated that her husband was able to tolerate oatmeal yesterday and today.  Patient's wife stated that her husband has not had any antibiotics recently.

## 2019-11-05 NOTE — Telephone Encounter (Signed)
This really doesn't sound like C dif Glad he is back to some pureed foods Have her monitor ---though stools will be more loose generally with nutritional drinks like Ensure

## 2019-11-06 ENCOUNTER — Ambulatory Visit (INDEPENDENT_AMBULATORY_CARE_PROVIDER_SITE_OTHER): Payer: PPO | Admitting: Psychology

## 2019-11-06 ENCOUNTER — Other Ambulatory Visit: Payer: Self-pay

## 2019-11-06 DIAGNOSIS — R4189 Other symptoms and signs involving cognitive functions and awareness: Secondary | ICD-10-CM | POA: Diagnosis not present

## 2019-11-06 DIAGNOSIS — I129 Hypertensive chronic kidney disease with stage 1 through stage 4 chronic kidney disease, or unspecified chronic kidney disease: Secondary | ICD-10-CM | POA: Diagnosis not present

## 2019-11-06 DIAGNOSIS — I2782 Chronic pulmonary embolism: Secondary | ICD-10-CM | POA: Diagnosis not present

## 2019-11-06 DIAGNOSIS — F0280 Dementia in other diseases classified elsewhere without behavioral disturbance: Secondary | ICD-10-CM | POA: Diagnosis not present

## 2019-11-06 DIAGNOSIS — I2692 Saddle embolus of pulmonary artery without acute cor pulmonale: Secondary | ICD-10-CM | POA: Diagnosis not present

## 2019-11-06 DIAGNOSIS — R262 Difficulty in walking, not elsewhere classified: Secondary | ICD-10-CM | POA: Diagnosis not present

## 2019-11-06 DIAGNOSIS — M6281 Muscle weakness (generalized): Secondary | ICD-10-CM | POA: Diagnosis not present

## 2019-11-06 DIAGNOSIS — G20 Parkinson's disease: Secondary | ICD-10-CM

## 2019-11-06 DIAGNOSIS — Z86718 Personal history of other venous thrombosis and embolism: Secondary | ICD-10-CM | POA: Diagnosis not present

## 2019-11-06 DIAGNOSIS — R2681 Unsteadiness on feet: Secondary | ICD-10-CM | POA: Diagnosis not present

## 2019-11-06 DIAGNOSIS — R278 Other lack of coordination: Secondary | ICD-10-CM | POA: Diagnosis not present

## 2019-11-06 DIAGNOSIS — Z741 Need for assistance with personal care: Secondary | ICD-10-CM | POA: Diagnosis not present

## 2019-11-06 NOTE — Progress Notes (Signed)
° °  Neuropsychology Feedback Session Walter [name]. [facility]  Reason for Referral:   Walter [name] a 86 y.o. right-handed Caucasian male referred by Walter [name], D.O.,to characterize hiscurrent cognitive functioning and assist with diagnostic clarity and treatment planning in the context of subjective cognitive decline and concerns regarding Parkinson's disease dementia.   Feedback:   Walter [name] completed a comprehensive neuropsychological evaluation on 10/29/2019. Please refer to that encounter for the full report and recommendations. Briefly, results suggested somewhat diffuse cognitive impairment with most prominent areas of dysfunction surrounding executive functioning and visuospatial abilities. Additional weaknesses were noted across receptive language and verbal fluency. Performance variability was exhibited across processing speed, confrontation naming, and learning and memory. Regarding etiology, the most likely culprit for ongoing cognitive decline is the presence of a Parkinson's disease dementia presentation. His medical history, report that medications have led to some noticeable improvements in motor symptoms, and the results of current testing are all consistent with this presentation. It is worth noting that prominent deficits with executive functioning and visuospatial abilities is also a hallmark sign of Lewy body dementia. While current test scores would be consistent with this illness, Walter [name] and his son denied behavioral characteristics which are commonly associated with this condition, namely fluctuations in alertness and fully-formed visual hallucinations. He does seem to have some REM sleep disturbances. However, these can also occur in Parkinson's disease dementia presentations and were said to have been improved following recent medication adjustments. Overall, Lewy body dementia seems less likely at the present time.  The current  telephone feedback appointment was completed with Walter [name] wife and son Walter [name]. They were within their respective residences while I was within my office. I discussed the limitations of evaluation and management by telemedicine and the availability of in person appointments. They expressed his understanding and agreed to proceed. Content of the current session focused on the results of his neuropsychological evaluation. Walter [name] family were given the opportunity to ask questions and their questions were answered. They were encouraged to reach out should additional questions arise. A copy of his report was mailed at the conclusion of his original visit.      22 minutes were spent conducting the current feedback session with Walter [name] family, billed as one unit [phone].

## 2019-11-06 NOTE — Patient Instructions (Signed)
Recommendations: A repeat neuropsychological evaluation in 12-18 months (or sooner if functional decline is noted) is recommended to assess the trajectory of future cognitive decline should it occur. This will also aid in future efforts towards improved diagnostic clarity.  If there is a desire to attempt for further diagnostic clarity, additional neuroimaging could be considered, namely an updated brain MRI as his previous scan was completed in 2017. Walter [name] should discuss the pros and cons of these procedures with Dr. Carles Collet.   Should there be a progression of his current deficits over time, Walter [name] is unlikely to regain any independent living skills lost. Therefore, it is recommended that he remain as involved as possible in all aspects of household chores, finances, and medication management, with supervision to ensure adequate performance. He will likely benefit from the establishment and maintenance of a routine in order to maximize his functional abilities over time.  It will be important for Walter [name] to have another person with him when in situations where he may need to process information, weigh the pros and cons of different options, and make decisions, in order to ensure that he fully understands and recalls all information to be considered.  If not already done, Walter [name] and his family may want to discuss his wishes regarding durable power of attorney and medical decision making, so that he can have input into these choices. Additionally, they may wish to discuss future plans for caretaking and seek out community options for in home/residential care should they become necessary.  Walter [name] is encouraged to attend to lifestyle factors for brain health (e.g., regular physical exercise, good nutrition habits, regular participation in cognitively-stimulating activities, and general stress management techniques), which are likely to have benefits for both emotional adjustment and cognition. Optimal  control of vascular risk factors (including safe cardiovascular exercise and adherence to dietary recommendations) is encouraged.   When learning new information, he would benefit from information being broken up into small, manageable pieces. He may also find it helpful to articulate the material in his own words and in a context to promote encoding at the onset of a new task. This material may need to be repeated multiple times to promote encoding.  Memory can be improved using internal strategies such as rehearsal, repetition, chunking, mnemonics, association, and imagery. External strategies such as written notes in a consistently used memory journal, visual and nonverbal auditory cues such as a calendar on the refrigerator or appointments with alarm, such as on a cell phone, can also help maximize recall.    To address problems with processing speed, he may wish to consider:   -Ensuring that he is alerted when essential material or instructions are being presented   -Adjusting the speed at which new information is presented   -Allowing for more time in comprehending, processing, and responding in conversation  To address problems with executive dysfunction, he may wish to consider:   -Avoiding external distractions when needing to concentrate   -Limiting exposure to fast paced environments with multiple sensory demands   -Writing down complicated information and using checklists   -Attempting and completing one task at a time (i.e., no multi-tasking)   -Verbalizing aloud each step of a task to maintain focus   -Taking frequent breaks during the completion of steps/tasks to avoid fatigue   -Reducing the amount of information considered at one time

## 2019-11-22 ENCOUNTER — Telehealth: Payer: Self-pay | Admitting: Neurology

## 2019-11-22 DIAGNOSIS — G20 Parkinson's disease: Secondary | ICD-10-CM

## 2019-11-22 NOTE — Telephone Encounter (Signed)
Is it ok for the patient to continue therapy?

## 2019-11-22 NOTE — Telephone Encounter (Signed)
Ok

## 2019-11-22 NOTE — Telephone Encounter (Signed)
Patient wife would like to have Dr Tat order a another round of PT for patient. He just finished a round and they would like to cont. With PT. He is at [facility] and they will go to his house to do the PT  Please call patient wife

## 2019-11-22 NOTE — Telephone Encounter (Signed)
Spoke with patient and advised him to have the PT give our office a call. He voiced understanding.

## 2019-11-25 NOTE — Telephone Encounter (Signed)
Spoke with patients wife and explained to her that all that is needed is for the Physical therapist to contact our office for a verbal. She voiced understanding and states she will have them call our office.

## 2019-11-25 NOTE — Telephone Encounter (Signed)
Patient's wife called in having some questions/clarification about her husband's physical therapy. She stated someone spoke with her husband last week and there is some confusion.

## 2019-11-27 ENCOUNTER — Other Ambulatory Visit: Payer: PPO | Admitting: Adult Health Nurse Practitioner

## 2019-11-27 ENCOUNTER — Other Ambulatory Visit: Payer: Self-pay

## 2019-11-27 DIAGNOSIS — Z515 Encounter for palliative care: Secondary | ICD-10-CM | POA: Diagnosis not present

## 2019-11-27 DIAGNOSIS — G20 Parkinson's disease: Secondary | ICD-10-CM

## 2019-11-27 NOTE — Progress Notes (Signed)
[facility] Consult Note Telephone: [phone]  Fax: [phone]  PATIENT NAME: Walter [name] DOB: Oct 27, 1931 MRN: [id number]  PRIMARY CARE PROVIDER:   Venia Carbon, MD  REFERRING PROVIDER:  Venia Carbon, MD [place],  [place] [address]  RESPONSIBLE PARTY: Wife, Antonia Culbertson [phone]   RECOMMENDATIONS and PLAN: 1.Advanced care planning. Patient is a DNR  2.  Parkinson's disease.  Patient doing well on dissolvable sinemet.  No functional changes.  Continues to use walker to ambulate.  Requires assistance with ADLs.  Is able to feed himself. PT has recently stopped but are checking to see if they can get an extension.  Continue follow up and recommendations by neurology.  3.  Support.  They continue to have an aide that helps out for an hour each morning on weekdays.  Do not express needing additional help at this time.    Palliative will continue to monitor for symptom management/decline and make recommendations as needed.  Next appointment is in 8 weeks.  Encouraged to call sooner with any concerns.  I spent 50 minutes providing this consultation,  from 1:00 to 1:50 including time spent with patient/family, chart review, provider coordination, documentation. More than 50% of the time in this consultation was spent coordinating communication.   HISTORY OF PRESENT ILLNESS:  Walter [name] is a 86 y.o. year old male with multiple medical problems including Parkinson's disease, HTN, BPH, h/o oral cancer, hypothyroidism. Palliative Care was asked to help address goals of care. Patient was evaluated at neurology and found to have Parkinson's related dementia. Has no new complaints today.  Wife does state that she has noticed some freezing around his mouth and he does seem to be speaking slower today.  Denies pain, SOB, cough, fever, N/V/D, constipation, dysuria, hematuria.  CODE STATUS: DNR  PPS: 40% HOSPICE  ELIGIBILITY/DIAGNOSIS: TBD  PHYSICAL EXAM:  BP 118/68  HR 53  O2 98% on RA General: NAD, frail appearing, thin Cardiovascular: regular rate and rhythm Pulmonary:lung sounds clear; normal respiratory effort Abdomen: soft, nontender, + bowel sounds GU: no suprapubic tenderness Extremities:trace edema of feet and ankles, no joint deformities Neurological: Weakness; A&O to person and place  PAST MEDICAL HISTORY:  Past Medical History:  Diagnosis Date  . Abdominal aortic aneurysm (AAA)   . Actinic keratosis 07/31/2013  . ARF (acute renal failure) 2011   after TKR  . Benign prostatic hyperplasia   . Bladder cancer 03/26/2019  . Bradycardia 09/26/2019  . C. difficile diarrhea   . CKD (chronic kidney disease) 03/05/2016   stage 3, GFR 30-59 ml/min  . DVT (deep venous thrombosis) 1998   after left knee arthroscopy  . Dysphagia 10/02/2018  . History of colonoscopy 2000  . Hypertension   . Hypothyroidism 2007   postop from thyroidectomy (cancer scare)   . Kidney stones    recurrent  . Major neurocognitive disorder due to Parkinson's disease without behavioral disturbance 10/30/2019  . Oral cancer 2004   graft from left thigh  . Osteoarthritis of both knees   . Parkinson's disease   . Pulmonary embolism   . RBD (REM behavioral disorder) 10/24/2017  . 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 Tendonitis 2012   from quinolone  . Toe amputation status 2010  . Vitamin B12 deficiency 09/09/2014    SOCIAL HX:  Social History   Tobacco Use  . Smoking status: Former Smoker    Types: Cigars  Quit date: 04/27/2002    Years since quitting: 17.5  . Smokeless tobacco: Never Used  Substance Use Topics  . Alcohol use: Not Currently    Alcohol/week: 0.0 standard drinks    ALLERGIES:  Allergies  Allergen Reactions  . Quinolones     tendonitis     PERTINENT MEDICATIONS:  Outpatient Encounter Medications as of 11/27/2019  Medication Sig  . acetaminophen  (TYLENOL) 325 MG tablet Take 2 tablets (650 mg total) by mouth every 6 (six) hours as needed for mild pain or moderate pain (or Fever >/= 101).  Marland Kitchen apixaban (ELIQUIS) 2.5 MG TABS tablet Take 1 tablet (2.5 mg total) by mouth 2 (two) times daily.  . carbidopa-levodopa (PARCOPA) 25-100 MG disintegrating tablet 2 tablets at 8 AM, 1 tablet at 11 AM, 2 tablets at 2 PM, 1 tablet at 6 PM  . cephALEXin (KEFLEX) 500 MG capsule Take 1 capsule (500 mg total) by mouth 3 (three) times daily.  . DENTA 5000 PLUS 1.1 % CREA dental cream Place 1 application onto teeth daily.  Marland Kitchen levothyroxine (SYNTHROID) 112 MCG tablet Take 1 tablet (112 mcg total) by mouth daily before breakfast.   No facility-administered encounter medications on file as of 11/27/2019.      Yanni Quiroa Jenetta Downer, NP

## 2019-12-05 DIAGNOSIS — H353131 Nonexudative age-related macular degeneration, bilateral, early dry stage: Secondary | ICD-10-CM | POA: Diagnosis not present

## 2019-12-13 ENCOUNTER — Encounter: Payer: Self-pay | Admitting: Emergency Medicine

## 2019-12-13 ENCOUNTER — Emergency Department: Payer: PPO

## 2019-12-13 DIAGNOSIS — R5381 Other malaise: Secondary | ICD-10-CM | POA: Diagnosis not present

## 2019-12-13 DIAGNOSIS — I129 Hypertensive chronic kidney disease with stage 1 through stage 4 chronic kidney disease, or unspecified chronic kidney disease: Secondary | ICD-10-CM | POA: Diagnosis not present

## 2019-12-13 DIAGNOSIS — R319 Hematuria, unspecified: Secondary | ICD-10-CM | POA: Diagnosis not present

## 2019-12-13 DIAGNOSIS — G20 Parkinson's disease: Secondary | ICD-10-CM | POA: Insufficient documentation

## 2019-12-13 DIAGNOSIS — N183 Chronic kidney disease, stage 3 unspecified: Secondary | ICD-10-CM | POA: Diagnosis not present

## 2019-12-13 DIAGNOSIS — Z79890 Hormone replacement therapy (postmenopausal): Secondary | ICD-10-CM | POA: Insufficient documentation

## 2019-12-13 DIAGNOSIS — Z8551 Personal history of malignant neoplasm of bladder: Secondary | ICD-10-CM | POA: Insufficient documentation

## 2019-12-13 DIAGNOSIS — Z96652 Presence of left artificial knee joint: Secondary | ICD-10-CM | POA: Insufficient documentation

## 2019-12-13 DIAGNOSIS — R531 Weakness: Secondary | ICD-10-CM | POA: Diagnosis not present

## 2019-12-13 DIAGNOSIS — R404 Transient alteration of awareness: Secondary | ICD-10-CM | POA: Diagnosis not present

## 2019-12-13 DIAGNOSIS — Z85818 Personal history of malignant neoplasm of other sites of lip, oral cavity, and pharynx: Secondary | ICD-10-CM | POA: Insufficient documentation

## 2019-12-13 DIAGNOSIS — R42 Dizziness and giddiness: Secondary | ICD-10-CM | POA: Diagnosis not present

## 2019-12-13 DIAGNOSIS — E039 Hypothyroidism, unspecified: Secondary | ICD-10-CM | POA: Insufficient documentation

## 2019-12-13 DIAGNOSIS — Z87891 Personal history of nicotine dependence: Secondary | ICD-10-CM | POA: Diagnosis not present

## 2019-12-13 DIAGNOSIS — Z7901 Long term (current) use of anticoagulants: Secondary | ICD-10-CM | POA: Insufficient documentation

## 2019-12-13 DIAGNOSIS — R462 Strange and inexplicable behavior: Secondary | ICD-10-CM | POA: Diagnosis present

## 2019-12-13 DIAGNOSIS — I10 Essential (primary) hypertension: Secondary | ICD-10-CM | POA: Diagnosis not present

## 2019-12-13 LAB — CBC
HCT: 44.4 % (ref 39.0–52.0)
Hemoglobin: 14.7 g/dL (ref 13.0–17.0)
MCH: 30.7 pg (ref 26.0–34.0)
MCHC: 33.1 g/dL (ref 30.0–36.0)
MCV: 92.7 fL (ref 80.0–100.0)
Platelets: 201 10*3/uL (ref 150–400)
RBC: 4.79 MIL/uL (ref 4.22–5.81)
RDW: 13.7 % (ref 11.5–15.5)
WBC: 5.2 10*3/uL (ref 4.0–10.5)
nRBC: 0 % (ref 0.0–0.2)

## 2019-12-13 NOTE — ED Triage Notes (Signed)
Pt arrives via [facility] with c/o feeling numbness in his whole body. Pt states that this has made him feel "woozy". Pt called EMS from home because he knew that he could not get up and walk. Pt reports that this started between 2000-2200.

## 2019-12-14 ENCOUNTER — Emergency Department
Admission: EM | Admit: 2019-12-14 | Discharge: 2019-12-14 | Disposition: A | Discharge status: Home / Self Care | Payer: PPO | Attending: Emergency Medicine | Admitting: Emergency Medicine

## 2019-12-14 DIAGNOSIS — R404 Transient alteration of awareness: Secondary | ICD-10-CM | POA: Diagnosis not present

## 2019-12-14 DIAGNOSIS — R42 Dizziness and giddiness: Secondary | ICD-10-CM | POA: Diagnosis not present

## 2019-12-14 DIAGNOSIS — I10 Essential (primary) hypertension: Secondary | ICD-10-CM | POA: Diagnosis not present

## 2019-12-14 DIAGNOSIS — R319 Hematuria, unspecified: Secondary | ICD-10-CM

## 2019-12-14 LAB — BASIC METABOLIC PANEL
Anion gap: 14 (ref 5–15)
BUN: 24 mg/dL — ABNORMAL HIGH (ref 8–23)
CO2: 25 mmol/L (ref 22–32)
Calcium: 9.3 mg/dL (ref 8.9–10.3)
Chloride: 102 mmol/L (ref 98–111)
Creatinine, Ser: 1.42 mg/dL — ABNORMAL HIGH (ref 0.61–1.24)
GFR, Estimated: 44 mL/min — ABNORMAL LOW (ref >60)
Glucose, Bld: 93 mg/dL (ref 70–99)
Potassium: 3.9 mmol/L (ref 3.5–5.1)
Sodium: 141 mmol/L (ref 135–145)

## 2019-12-14 LAB — URINALYSIS, COMPLETE (UACMP) WITH MICROSCOPIC
Bacteria, UA: NONE SEEN
Bilirubin Urine: NEGATIVE
Glucose, UA: NEGATIVE mg/dL
Ketones, ur: NEGATIVE mg/dL
Nitrite: NEGATIVE
Protein, ur: NEGATIVE mg/dL
RBC / HPF: >50 RBC/hpf — ABNORMAL HIGH (ref 0–5)
Specific Gravity, Urine: 1.01 (ref 1.005–1.030)
Squamous Epithelial / HPF: NONE SEEN (ref 0–5)
pH: 6 (ref 5.0–8.0)

## 2019-12-14 NOTE — ED Notes (Signed)
Patient assisted with the urinal

## 2019-12-14 NOTE — Discharge Instructions (Addendum)
Your workup in the Emergency Department today was reassuring.  We did not find any specific abnormalities.  We recommend you drink plenty of fluids, take your regular medications and/or any new ones prescribed today, and follow up with the doctor(s) listed in these documents as recommended.  You do have have some blood in your urine but no other signs of infection, so we recommend you follow-up with your regular doctor to see if it clears or if you need further assessment for the cause of the blood in your urine.  Return to the Emergency Department if you develop new or worsening symptoms that concern you.

## 2019-12-14 NOTE — ED Notes (Signed)
Patient incontinent of urine. Patient changed at this time.

## 2019-12-14 NOTE — ED Notes (Signed)
Pt wife called for pickup.

## 2019-12-14 NOTE — ED Provider Notes (Addendum)
[facility] Emergency Department Provider Note  ____________________________________________   First MD Initiated Contact with Patient 12/14/19 [phone]     (approximate)  I have reviewed the triage vital signs and the nursing notes.   HISTORY  Chief Complaint Weakness    HPI Walter [name] is a 86 y.o. male with medical history as listed below which notably includes Parkinson's disease for which she is actively being treated.  He also has seen a palliative care specialist and has a DNR order in place.  He presents tonight by EMS for evaluation of a brief episode in which he felt very unusual.  He said he had a normal day and has been compliant with his medications.  Tonight he had a bit of a cough which is not unusual for him.  He took a "slug" of cough syrup and shortly thereafter he states that he felt like he was leaving his body.  He describes that as if he felt like he was floating up above himself.  He did not lose consciousness and was unaware of everything that was happening, but he felt like he was leaving his body.  He said it was not painful or scary, rather calm and peaceful, but it was strange enough that he felt like he should be checked out.  He also felt like this manifested with a sense of generalized weakness all over but without any specific numbness or tingling or weakness on one side of his body.  He describes the episode was acute in onset, brief, and severe.  Nothing in particular made it better or worse.  He denies recent fever/chills, visual changes, neck pain, chest pain, shortness of breath, nausea, vomiting, and dysuria.             Past Medical History:  Diagnosis Date  . Abdominal aortic aneurysm (AAA)   . Actinic keratosis 07/31/2013  . ARF (acute renal failure) 2011   after TKR  . Benign prostatic hyperplasia   . Bladder cancer 03/26/2019  . Bradycardia 09/26/2019  . C. difficile diarrhea   . CKD (chronic kidney disease) 03/05/2016    stage 3, GFR 30-59 ml/min  . DVT (deep venous thrombosis) 1998   after left knee arthroscopy  . Dysphagia 10/02/2018  . History of colonoscopy 2000  . Hypertension   . Hypothyroidism 2007   postop from thyroidectomy (cancer scare)   . Kidney stones    recurrent  . Major neurocognitive disorder due to Parkinson's disease without behavioral disturbance 10/30/2019  . Oral cancer 2004   graft from left thigh  . Osteoarthritis of both knees   . Parkinson's disease   . Pulmonary embolism   . RBD (REM behavioral disorder) 10/24/2017  . 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 Tendonitis 2012   from quinolone  . Toe amputation status 2010  . Vitamin B12 deficiency 09/09/2014    Patient Active Problem List   Diagnosis Date Noted  . Major neurocognitive disorder due to Parkinson's disease without behavioral disturbance 10/30/2019  . Inflammation of interphalangeal joint of right toe due to infection 09/26/2019  . Corn infected 09/26/2019  . Bradycardia 09/26/2019  . Hammer toe of right foot 09/26/2019  . Fatigue 09/04/2019  . Bladder cancer 03/26/2019  . Malnutrition of mild degree 12/21/2018  . Pressure injury of skin 11/11/2018  . Dysphagia 10/02/2018  . Abdominal aortic aneurysm (AAA)   . RBD (REM behavioral disorder) 10/24/2017  . Renal cyst, right 06/06/2016  .  CKD (chronic kidney disease) stage 3, GFR 30-59 ml/min (HCC) 03/05/2016  . 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 Vitamin B12 deficiency 09/09/2014  . Parkinson's disease ([place]) 08/04/2014  . Esophageal diverticulum 01/01/2014  . Actinic keratosis 07/31/2013  . Hypertension   . Osteoarthritis of both knees   . Toe amputation status   . Benign prostatic hyperplasia   . Kidney stones   . Hypothyroidism   . Tendonitis     Past Surgical History:  Procedure Laterality Date  . APPENDECTOMY    . CHOLECYSTECTOMY    . CYSTOSCOPY W/ RETROGRADES  Bilateral 03/18/2019   Procedure: CYSTOSCOPY WITH RETROGRADE PYELOGRAM;  Surgeon: Hollice Espy, MD;  Location: [facility] ORS;  Service: Urology;  Laterality: Bilateral;  . CYSTOSCOPY/URETEROSCOPY/HOLMIUM LASER/STENT PLACEMENT Right 09/13/2017   Procedure: CYSTOSCOPY/URETEROSCOPY/HOLMIUM LASER/STENT PLACEMENT;  Surgeon: Hollice Espy, MD;  Location: [facility] ORS;  Service: Urology;  Laterality: Right;  . HERNIA REPAIR    . INTRAMEDULLARY (IM) NAIL INTERTROCHANTERIC Right 06/13/2017   Procedure: INTRAMEDULLARY (IM) NAIL INTERTROCHANTRIC;  Surgeon: Hessie Knows, MD;  Location: [facility] ORS;  Service: Orthopedics;  Laterality: Right;  . LITHOTRIPSY  2009/2010   then stent and cystoscopic removal 2014  . Oral cancer removed Right 2004  . REPAIR ZENKER'S DIVERTICULA  2013  . THYROIDECTOMY  2007  . TOE AMPUTATION Right 2010   badly overlapping other toe  . TOTAL KNEE ARTHROPLASTY Left   . TRANSURETHRAL RESECTION OF BLADDER TUMOR WITH MITOMYCIN-C N/A 03/18/2019   Procedure: TRANSURETHRAL RESECTION OF BLADDER TUMOR WITH Gemcitabine;  Surgeon: Hollice Espy, MD;  Location: [facility] ORS;  Service: Urology;  Laterality: N/A;  . TRANSURETHRAL RESECTION OF PROSTATE  2011  . UMBILICAL HERNIA REPAIR  2007    Prior to Admission medications   Medication Sig Start Date End Date Taking? Authorizing Provider  acetaminophen (TYLENOL) 325 MG tablet Take 2 tablets (650 mg total) by mouth every 6 (six) hours as needed for mild pain or moderate pain (or Fever >/= 101). 06/16/17  Yes Dustin Flock, MD  apixaban (ELIQUIS) 2.5 MG TABS tablet Take 1 tablet (2.5 mg total) by mouth 2 (two) times daily. 03/26/19  Yes Venia Carbon, MD  carbidopa-levodopa (PARCOPA) 25-100 MG disintegrating tablet Take 1-2 tablets by mouth as directed. 2 tablets at 8 AM, 1 tablet at 11 AM, 2 tablets at 2 PM, 1 tablet at 6 PM 12/05/19  Yes [provider]  DENTA 5000 PLUS 1.1 % CREA dental cream Place 1 application onto teeth daily. 02/26/19   Yes [provider]  levothyroxine (SYNTHROID) 112 MCG tablet Take 1 tablet (112 mcg total) by mouth daily before breakfast. 03/26/19  Yes Venia Carbon, MD    Allergies Quinolones  Family History  Problem Relation Age of Onset  . Dementia Mother   . Stroke Father   . Pulmonary disease Brother   . Heart disease Neg Hx   . Diabetes Neg Hx     Social History Social History   Tobacco Use  . Smoking status: Former Smoker    Types: Cigars    Quit date: 04/27/2002    Years since quitting: 17.6  . Smokeless tobacco: Never Used  Vaping Use  . Vaping Use: Never used  Substance Use Topics  . Alcohol use: Not Currently    Alcohol/week: 0.0 standard drinks  . Drug use: No    Review of Systems Constitutional: No fever/chills Eyes: No visual changes. ENT: No sore throat. Cardiovascular: Denies chest pain. Respiratory:  Denies shortness of breath. Gastrointestinal: No abdominal pain.  No nausea, no vomiting.  No diarrhea.  No constipation. Genitourinary: Negative for dysuria. Musculoskeletal: Negative for neck pain.  Negative for back pain. Integumentary: Negative for rash. Neurological: Negative for headaches, focal weakness or numbness. Psychiatric:  Brief "out of body" experience  ____________________________________________   PHYSICAL EXAM:  VITAL SIGNS: ED Triage Vitals  Enc Vitals Group     BP 12/13/19 2324 (!) 168/82     Pulse Rate 12/13/19 2324 72     Resp 12/13/19 2324 20     Temp 12/13/19 2324 98.1 F (36.7 C)     Temp Source 12/13/19 2324 Oral     SpO2 12/13/19 2324 97 %     Weight 12/13/19 2324 74.8 kg (165 lb)     Height 12/13/19 2324 1.778 m (5\' 10" )     Head Circumference --      Peak Flow --      Pain Score 12/13/19 2333 0     Pain Loc --      Pain Edu? --      Excl. in [place]? --     Constitutional: Alert and oriented.  Eyes: Conjunctivae are normal.  Pupils are equal and reactive bilaterally. Head: Atraumatic. Nose: No  congestion/rhinnorhea. Mouth/Throat: Patient is wearing a mask. Neck: No stridor.  No meningeal signs.   Cardiovascular: Normal rate, regular rhythm. Good peripheral circulation. Grossly normal heart sounds. Respiratory: Normal respiratory effort.  No retractions. Gastrointestinal: Soft and nontender. No distention.  Musculoskeletal: No lower extremity tenderness nor edema. No gross deformities of extremities. Neurologic:  Normal speech and language.  Mild resting tremor of his extremities.  No cogwheeling or rigidity.  Good muscle strength in bilateral upper and lower extremities.  No obvious cerebellar deficits.   Skin:  Skin is warm, dry and intact. Psychiatric: Mood and affect are normal. Speech and behavior are normal.  ____________________________________________   LABS (all labs ordered are listed, but only abnormal results are displayed)  Labs Reviewed  BASIC METABOLIC PANEL - Abnormal; Notable for the following components:      Result Value   BUN 24 (*)    Creatinine, Ser 1.42 (*)    GFR, Estimated 44 (*)    All other components within normal limits  URINALYSIS, COMPLETE (UACMP) WITH MICROSCOPIC - Abnormal; Notable for the following components:   Color, Urine YELLOW (*)    APPearance CLEAR (*)    Hgb urine dipstick LARGE (*)    Leukocytes,Ua TRACE (*)    RBC / HPF >50 (*)    All other components within normal limits  CBC  CBG MONITORING, ED   ____________________________________________  EKG  ED ECG REPORT I, Hinda Kehr, the attending physician, personally viewed and interpreted this ECG.  Date: 12/13/2019 EKG Time: 23: 16 Rate: 73 Rhythm: Sinus rhythm with first-degree AV block QRS Axis: normal Intervals: PR interval of 304 ms ST/T Wave abnormalities: Non-specific ST segment / T-wave changes, but no clear evidence of acute ischemia. Narrative Interpretation: no definitive evidence of acute ischemia; does not meet STEMI  criteria.   ____________________________________________  RADIOLOGY I, Hinda Kehr, personally viewed and evaluated these images (plain radiographs) as part of my medical decision making, as well as reviewing the written report by the radiologist.  ED MD interpretation: No acute abnormalities identified on head CT  Official radiology report(s): CT Head Wo Contrast  Result Date: 12/14/2019 CLINICAL DATA:  Dizziness and numbness EXAM: CT HEAD WITHOUT CONTRAST TECHNIQUE: Contiguous axial  images were obtained from the base of the skull through the vertex without intravenous contrast. COMPARISON:  11/09/2018 FINDINGS: Brain: No hemorrhage, extra-axial collection or mass effect. There is mild generalized volume loss. White matter hypoattenuation. Vascular: No hyperdense vessel or unexpected calcification. Skull: Normal. Negative for fracture or focal lesion. Sinuses/Orbits: No acute finding. Other: None. IMPRESSION: 1. No acute intracranial abnormality. 2. Chronic small vessel ischemia and mild generalized volume loss. Electronically Signed   By: Ulyses Jarred M.D.   On: 12/14/2019 00:32    ____________________________________________   PROCEDURES   Procedure(s) performed (including Critical Care):  Procedures   ____________________________________________   INITIAL IMPRESSION / MDM / [place] / ED COURSE  As part of my medical decision making, I reviewed the following data within the [facility] notes reviewed and incorporated, Labs reviewed , Old chart reviewed and Notes from prior ED visits   Differential diagnosis includes, but is not limited to, medication or drug side effect, acute infection, metabolic or electrolyte abnormality, CVA or intracranial bleed.  The patient has been stable for nearly 7 hours in the emergency department.  His symptoms have resolved.  His vital signs are stable other than hypertension.  Physical exam including  neurological exam is reassuring.  Creatinine is stable compared to prior.  He is in no distress.  CT head shows no acute abnormalities CBC is normal.  EKG shows no sign of ischemia.  Urinalysis notable for hematuria without any obvious evidence of infection.  A urine culture was ordered just to be sure but there is no evidence of an acute or emergent urinary condition at this time.  He is having no pain to suggest a kidney stone that requires further imaging or evaluation.   The patient was able to ambulate at his baseline level of function.  He is comfortable with the plan for discharge and close outpatient follow-up.  There is no evidence that he has an emergent medical condition tonight and I gave my usual and customary return precautions.  ____________________________________________  FINAL CLINICAL IMPRESSION(S) / ED DIAGNOSES  Final diagnoses:  Transient alteration of awareness  Hematuria, unspecified type     MEDICATIONS GIVEN DURING THIS VISIT:  Medications - No data to display   ED Discharge Orders    None      *Please note:  Walter [name] was evaluated in Emergency Department on 12/14/2019 for the symptoms described in the history of present illness. He was evaluated in the context of the global COVID-19 pandemic, which necessitated consideration that the patient might be at risk for infection with the SARS-CoV-2 virus that causes COVID-19. Institutional protocols and algorithms that pertain to the evaluation of patients at risk for COVID-19 are in a state of rapid change based on information released by regulatory bodies including the CDC and federal and state organizations. These policies and algorithms were followed during the patient's care in the ED.  Some ED evaluations and interventions may be delayed as a result of limited staffing during and after the pandemic.*  Note:  This document was prepared using Dragon voice recognition software and may include unintentional  dictation errors.   Hinda Kehr, MD 12/14/19 [place], [place], MD 12/14/19 [phone]

## 2019-12-14 NOTE — ED Notes (Signed)
Pt ambulated with walker with no concerns. Pt placed in bed, monitor applied. Pt resting in bed. Pt endorsing episode that brought pt to seek care "it felt as though I was dying, completely pleasant."

## 2019-12-15 LAB — URINE CULTURE
Culture: NO GROWTH
Special Requests: NORMAL

## 2019-12-16 ENCOUNTER — Other Ambulatory Visit: Payer: Self-pay

## 2019-12-16 ENCOUNTER — Encounter: Payer: Self-pay | Admitting: Internal Medicine

## 2019-12-16 ENCOUNTER — Ambulatory Visit (INDEPENDENT_AMBULATORY_CARE_PROVIDER_SITE_OTHER): Payer: PPO | Admitting: Internal Medicine

## 2019-12-16 VITALS — BP 122/74 | HR 68 | Temp 98.3°F | Wt 161.2 lb

## 2019-12-16 DIAGNOSIS — R1310 Dysphagia, unspecified: Secondary | ICD-10-CM

## 2019-12-16 DIAGNOSIS — Z23 Encounter for immunization: Secondary | ICD-10-CM | POA: Diagnosis not present

## 2019-12-16 DIAGNOSIS — Z86711 Personal history of pulmonary embolism: Secondary | ICD-10-CM

## 2019-12-16 DIAGNOSIS — C679 Malignant neoplasm of bladder, unspecified: Secondary | ICD-10-CM | POA: Diagnosis not present

## 2019-12-16 DIAGNOSIS — G20 Parkinson's disease: Secondary | ICD-10-CM | POA: Diagnosis not present

## 2019-12-16 DIAGNOSIS — R404 Transient alteration of awareness: Secondary | ICD-10-CM | POA: Diagnosis not present

## 2019-12-16 DIAGNOSIS — R4182 Altered mental status, unspecified: Secondary | ICD-10-CM | POA: Insufficient documentation

## 2019-12-16 NOTE — Progress Notes (Signed)
Subjective:    Patient ID: Walter [name], male    DOB: [birth year], 86 y.o.   MRN: [id number]  HPI Here with wife for ER follow up---change in mental status This visit occurred during the SARS-CoV-2 public health emergency.  Safety protocols were in place, including screening questions prior to the visit, additional usage of staff PPE, and extensive cleaning of exam room while observing appropriate contact time as indicated for disinfecting solutions.   Had been doing well after series of PT sessions Was in chair 3 nights ago---"had an experience out of my body--like I was floating" He was afraid to move They call [facility] security---nurse came, etc EMS came---BP very high (187/100?) Brought to the ER Head CT negative Urinalysis showed microscopic hematuria but no pyuria of note Labs unremarkable  Wife did give him medication for watering eyes---gave liquid antihistamine--- may have been diphenhydramine Not cough medication  During ER evaluation---the "out of body" feeling had resolved  Current Outpatient Medications on File Prior to Visit  Medication Sig Dispense Refill  . acetaminophen (TYLENOL) 325 MG tablet Take 2 tablets (650 mg total) by mouth every 6 (six) hours as needed for mild pain or moderate pain (or Fever >/= 101).    Marland Kitchen apixaban (ELIQUIS) 2.5 MG TABS tablet Take 1 tablet (2.5 mg total) by mouth 2 (two) times daily. 180 tablet 3  . carbidopa-levodopa (PARCOPA) 25-100 MG disintegrating tablet Take 1-2 tablets by mouth as directed. 2 tablets at 8 AM, 1 tablet at 11 AM, 2 tablets at 2 PM, 1 tablet at 6 PM    . DENTA 5000 PLUS 1.1 % CREA dental cream Place 1 application onto teeth daily.    Marland Kitchen levothyroxine (SYNTHROID) 112 MCG tablet Take 1 tablet (112 mcg total) by mouth daily before breakfast. 90 tablet 3   No current facility-administered medications on file prior to visit.    Allergies  Allergen Reactions  . Quinolones Other (See Comments)    tendonitis    Past  Medical History:  Diagnosis Date  . Abdominal aortic aneurysm (AAA)   . Actinic keratosis 07/31/2013  . ARF (acute renal failure) 2011   after TKR  . Benign prostatic hyperplasia   . Bladder cancer 03/26/2019  . Bradycardia 09/26/2019  . C. difficile diarrhea   . CKD (chronic kidney disease) 03/05/2016   stage 3, GFR 30-59 ml/min  . DVT (deep venous thrombosis) 1998   after left knee arthroscopy  . Dysphagia 10/02/2018  . History of colonoscopy 2000  . Hypertension   . Hypothyroidism 2007   postop from thyroidectomy (cancer scare)   . Kidney stones    recurrent  . Major neurocognitive disorder due to Parkinson's disease without behavioral disturbance 10/30/2019  . Oral cancer 2004   graft from left thigh  . Osteoarthritis of both knees   . Parkinson's disease   . Pulmonary embolism   . RBD (REM behavioral disorder) 10/24/2017  . 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 Tendonitis 2012   from quinolone  . Toe amputation status 2010  . Vitamin B12 deficiency 09/09/2014    Past Surgical History:  Procedure Laterality Date  . APPENDECTOMY    . CHOLECYSTECTOMY    . CYSTOSCOPY W/ RETROGRADES Bilateral 03/18/2019   Procedure: CYSTOSCOPY WITH RETROGRADE PYELOGRAM;  Surgeon: Hollice Espy, MD;  Location: [facility] ORS;  Service: Urology;  Laterality: Bilateral;  . CYSTOSCOPY/URETEROSCOPY/HOLMIUM LASER/STENT PLACEMENT Right 09/13/2017   Procedure: CYSTOSCOPY/URETEROSCOPY/HOLMIUM LASER/STENT PLACEMENT;  Surgeon: Hollice Espy,  MD;  Location: [facility] ORS;  Service: Urology;  Laterality: Right;  . HERNIA REPAIR    . INTRAMEDULLARY (IM) NAIL INTERTROCHANTERIC Right 06/13/2017   Procedure: INTRAMEDULLARY (IM) NAIL INTERTROCHANTRIC;  Surgeon: Hessie Knows, MD;  Location: [facility] ORS;  Service: Orthopedics;  Laterality: Right;  . LITHOTRIPSY  2009/2010   then stent and cystoscopic removal 2014  . Oral cancer removed Right 2004  . REPAIR ZENKER'S DIVERTICULA  2013  .  THYROIDECTOMY  2007  . TOE AMPUTATION Right 2010   badly overlapping other toe  . TOTAL KNEE ARTHROPLASTY Left   . TRANSURETHRAL RESECTION OF BLADDER TUMOR WITH MITOMYCIN-C N/A 03/18/2019   Procedure: TRANSURETHRAL RESECTION OF BLADDER TUMOR WITH Gemcitabine;  Surgeon: Hollice Espy, MD;  Location: [facility] ORS;  Service: Urology;  Laterality: N/A;  . TRANSURETHRAL RESECTION OF PROSTATE  2011  . UMBILICAL HERNIA REPAIR  2007    Family History  Problem Relation Age of Onset  . Dementia Mother   . Stroke Father   . Pulmonary disease Brother   . Heart disease Neg Hx   . Diabetes Neg Hx     Social History   Socioeconomic History  . Marital status: Married    Spouse name: Not on file  . Number of children: 3  . Years of education: 16  . Highest education level: Bachelor's degree (e.g., BA, AB, BS)  Occupational History  . 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 Smoking status: Former Smoker    Types: Cigars    Quit date: 04/27/2002    Years since quitting: 17.6  . Smokeless tobacco: Never Used  Vaping Use  . Vaping Use: Never used  Substance and Sexual Activity  . Alcohol use: Not Currently    Alcohol/week: 0.0 standard drinks  . Drug use: No  . Sexual activity: Not Currently  Other Topics Concern  . Not on file  Social History Narrative   Son works for [employer]   1 daughter in [place], other in [place] living will   Wife, then son, would be health care [place]   Has DNR   No tube feeds if cognitively unaware   Has MOST form done 12/21/18   Social Determinants of Health   Financial Resource Strain:   . Difficulty of Paying Living Expenses: Not on file  Food Insecurity:   . Worried About Charity fundraiser in the Last Year: Not on file  . Ran Out of Food in the Last Year: Not on file  Transportation Needs:   . Lack of Transportation (Medical): Not on file  . Lack of Transportation  (Non-Medical): Not on file  Physical Activity:   . Days of Exercise per Week: Not on file  . Minutes of Exercise per Session: Not on file  Stress:   . Feeling of Stress : Not on file  Social Connections:   . Frequency of Communication with Friends and Family: Not on file  . Frequency of Social Gatherings with Friends and Family: Not on file  . Attends Religious Services: Not on file  . Active Member of Clubs or Organizations: Not on file  . Attends Archivist Meetings: Not on file  . Marital Status: Not on file  Intimate Partner Violence:   . Fear of Current or Ex-Partner: Not on file  . Emotionally Abused: Not on file  . Physically Abused: Not on file  . Sexually Abused:  Not on file   Review of Systems T max 99.7 No pain Able to walk again with walker once he was done in the ER Then had nausea this morning--skipped breakfast. Napped again for 1.5 hours and then ate. Nausea then gone No SOB or chest pain Eating well--still the pureed meals and other food as tolerated    Objective:   Physical Exam Constitutional:      Appearance: Normal appearance.  Cardiovascular:     Rate and Rhythm: Normal rate and regular rhythm.     Heart sounds: No murmur heard.  No gallop.   Lymphadenopathy:     Cervical: No cervical adenopathy.  Neurological:     Mental Status: He is alert.     Comments: Same bradykinesia  Psychiatric:        Mood and Affect: Mood normal.        Behavior: Behavior normal.            Assessment & Plan:

## 2019-12-16 NOTE — Patient Instructions (Signed)
It would be safer to try cetirizine or loratadine liquid for allergy symptoms.

## 2019-12-16 NOTE — Assessment & Plan Note (Signed)
Stable functional status on the sinemet

## 2019-12-16 NOTE — Addendum Note (Signed)
Addended by: Pilar Grammes on: 12/16/2019 04:59 PM   Modules accepted: Orders

## 2019-12-16 NOTE — Assessment & Plan Note (Signed)
Likely related to the antihistamine Discussed safer alternatives for allergy symptoms

## 2019-12-16 NOTE — Progress Notes (Signed)
   12/17/2019  CC:  Chief Complaint  Patient presents with  . Cysto    HPI: Walter [name] is a 86 y.o. male who returns for a 6 month surveillance cystoscopy. Accompanied by his son today.   Surgical pathology 1/18/2021s/p TURBT consistent with high-grade noninvasive urothelial carcinoma. Upper tracts unremarkable.  Elected to defer BCG given age and frailty.  Would consider if he has recurrence in the future.   He does have multiple medical comorbidities including Parkinson's and history of PE.  Patient is doing well today.   Blood pressure 133/76, pulse 71, height 5\' 10"  (1.778 m), weight 165 lb (74.8 kg). NED. A&Ox3.   No respiratory distress   Abd soft, NT, ND Normal phallus with bilateral descended testicles  Cystoscopy Procedure Note  Patient identification was confirmed, informed consent was obtained, and patient was prepped using Betadine solution.  Lidocaine jelly was administered per urethral meatus.     Pre-Procedure: - Inspection reveals a normal caliber ureteral meatus.  Procedure: The flexible cystoscope was introduced without difficulty - No urethral strictures/lesions are present. - Normal prostate  - TURP defect appreciated  - Open bladder neck - Bilateral ureteral orifices identified - Bladder mucosa  reveals no ulcers, tumors, or lesions - No bladder stones - Trabeculated bladder with small saccules at dome  Retroflexion shows no tumors   Post-Procedure: - Patient tolerated the procedure well  Assessment/ Plan:  1. History of malignant neoplasm of dome of urinary bladder S/p TURBT on 03/18/2019 Low risk non invasive bladder cancer Given his age and comorbidities we discussed spacing out cysto's at the patient's request. He was made aware this is not in the guidelines.  This does seem reasonable, will push his next go back to 9 months but he return sooner if he develops any urinary symptoms or gross hematuria RTC in 9 months for cystoscopy      I, Selena Batten, am acting as a scribe for Dr. Hollice Espy.  I have reviewed the above documentation for accuracy and completeness, and I agree with the above.   Hollice Espy, MD

## 2019-12-16 NOTE — Assessment & Plan Note (Signed)
Unprovoked DVT and PE Will continue the eliquis indefinitely

## 2019-12-16 NOTE — Assessment & Plan Note (Signed)
Due for cysto tomorrow Did have microscopic hematuria in ER 3 days ago

## 2019-12-16 NOTE — Assessment & Plan Note (Signed)
Doing okay on the pureed diet

## 2019-12-17 ENCOUNTER — Encounter: Payer: Self-pay | Admitting: Urology

## 2019-12-17 ENCOUNTER — Ambulatory Visit: Payer: PPO | Admitting: Urology

## 2019-12-17 ENCOUNTER — Other Ambulatory Visit: Payer: Self-pay

## 2019-12-17 VITALS — BP 133/76 | HR 71 | Ht 70.0 in | Wt 165.0 lb

## 2019-12-17 DIAGNOSIS — N3289 Other specified disorders of bladder: Secondary | ICD-10-CM

## 2019-12-20 DIAGNOSIS — R2681 Unsteadiness on feet: Secondary | ICD-10-CM | POA: Diagnosis not present

## 2019-12-20 DIAGNOSIS — G20 Parkinson's disease: Secondary | ICD-10-CM | POA: Diagnosis not present

## 2019-12-20 DIAGNOSIS — Z86718 Personal history of other venous thrombosis and embolism: Secondary | ICD-10-CM | POA: Diagnosis not present

## 2019-12-20 DIAGNOSIS — I2782 Chronic pulmonary embolism: Secondary | ICD-10-CM | POA: Diagnosis not present

## 2019-12-20 DIAGNOSIS — I2692 Saddle embolus of pulmonary artery without acute cor pulmonale: Secondary | ICD-10-CM | POA: Diagnosis not present

## 2019-12-20 DIAGNOSIS — Z741 Need for assistance with personal care: Secondary | ICD-10-CM | POA: Diagnosis not present

## 2019-12-20 DIAGNOSIS — R4189 Other symptoms and signs involving cognitive functions and awareness: Secondary | ICD-10-CM | POA: Diagnosis not present

## 2019-12-20 DIAGNOSIS — I129 Hypertensive chronic kidney disease with stage 1 through stage 4 chronic kidney disease, or unspecified chronic kidney disease: Secondary | ICD-10-CM | POA: Diagnosis not present

## 2019-12-20 DIAGNOSIS — R262 Difficulty in walking, not elsewhere classified: Secondary | ICD-10-CM | POA: Diagnosis not present

## 2019-12-20 DIAGNOSIS — M6281 Muscle weakness (generalized): Secondary | ICD-10-CM | POA: Diagnosis not present

## 2019-12-20 DIAGNOSIS — R278 Other lack of coordination: Secondary | ICD-10-CM | POA: Diagnosis not present

## 2019-12-30 DIAGNOSIS — I2782 Chronic pulmonary embolism: Secondary | ICD-10-CM | POA: Diagnosis not present

## 2019-12-30 DIAGNOSIS — Z86718 Personal history of other venous thrombosis and embolism: Secondary | ICD-10-CM | POA: Diagnosis not present

## 2019-12-30 DIAGNOSIS — M6281 Muscle weakness (generalized): Secondary | ICD-10-CM | POA: Diagnosis not present

## 2019-12-30 DIAGNOSIS — R262 Difficulty in walking, not elsewhere classified: Secondary | ICD-10-CM | POA: Diagnosis not present

## 2019-12-30 DIAGNOSIS — I2692 Saddle embolus of pulmonary artery without acute cor pulmonale: Secondary | ICD-10-CM | POA: Diagnosis not present

## 2019-12-30 DIAGNOSIS — R2681 Unsteadiness on feet: Secondary | ICD-10-CM | POA: Diagnosis not present

## 2019-12-30 DIAGNOSIS — R4189 Other symptoms and signs involving cognitive functions and awareness: Secondary | ICD-10-CM | POA: Diagnosis not present

## 2019-12-30 DIAGNOSIS — I129 Hypertensive chronic kidney disease with stage 1 through stage 4 chronic kidney disease, or unspecified chronic kidney disease: Secondary | ICD-10-CM | POA: Diagnosis not present

## 2019-12-30 DIAGNOSIS — G20 Parkinson's disease: Secondary | ICD-10-CM | POA: Diagnosis not present

## 2019-12-30 DIAGNOSIS — Z741 Need for assistance with personal care: Secondary | ICD-10-CM | POA: Diagnosis not present

## 2019-12-30 DIAGNOSIS — R278 Other lack of coordination: Secondary | ICD-10-CM | POA: Diagnosis not present

## 2020-01-21 ENCOUNTER — Other Ambulatory Visit: Payer: PPO | Admitting: Adult Health Nurse Practitioner

## 2020-01-21 ENCOUNTER — Other Ambulatory Visit: Payer: Self-pay

## 2020-01-21 DIAGNOSIS — Z515 Encounter for palliative care: Secondary | ICD-10-CM | POA: Diagnosis not present

## 2020-01-21 DIAGNOSIS — C679 Malignant neoplasm of bladder, unspecified: Secondary | ICD-10-CM

## 2020-01-21 DIAGNOSIS — G20 Parkinson's disease: Secondary | ICD-10-CM | POA: Diagnosis not present

## 2020-01-21 NOTE — Progress Notes (Signed)
[facility] Palliative Care Consult Note Telephone: [phone]  Fax: [phone]  PATIENT NAME: Walter [name] DOB: 03-14-1931 MRN: [id number]  PRIMARY CARE PROVIDER:   Venia Carbon, MD  REFERRING PROVIDER:  Venia Carbon, MD [place],  [place] [address]  RESPONSIBLE PARTY:   Wife, Walter [name] [phone]  Chief complaint:  Follow up palliative visit    RECOMMENDATIONS and PLAN: 1.Advanced care planning. Patient is a DNR.  No changes made today  2.  Parkinson's disease. No functional changes.  Continues to use walker to ambulate.  Requires assistance with ADLs. Is able to feed himself. Has restarted PT through [facility].  Continue PT as ordered.  Continue follow up and recommendations by neurology.    3.  Bladder Cancer.  Last month patient had cystoscopy as follow-up for bladder cancer which was unremarkable.  Due to his age and frailty urologist has agreed to cystoscopy every 9 months instead of every 6 months.  Continue follow-up and recommendations by urology.  Palliative will continue to monitor for symptom management/decline and make recommendations as needed. Next appointment is in 8 weeks. Encouraged to call sooner with any concerns.   I spent 40 minutes providing this consultation. More than 50% of the time in this consultation was spent coordinating communication.   HISTORY OF PRESENT ILLNESS:  Walter [name] is a 86 y.o. year old male with multiple medical problems including Parkinson's disease, HTN, BPH, h/o oral cancer, hypothyroidism. Palliative Care was asked to help address goals of care.  Patient was evaluated in ER on 12/14/2019 for transient alteration of awareness.  Patient states that he felt like he was floating above his body and felt like his body felt numb as if he could not move.  Patient was evaluated in ER CT did not show any acute findings.  Urinalysis was negative.  CBC and BMP were  unremarkable.  History of event mainly obtained by wife could not recall details of the event.  Wife does state that he did not fall but he was unable to move.  The [facility] nurse was called and recommended he be evaluated in the ER.  Patient did have visit with PCP 3 days later and feels as if Benadryl may have caused some delirium.  Wife does state that when needed she would give him Benadryl but only half a dose and that this time she had given him a full dose.  PCP instructed not to use Benadryl anymore and recommended Claritin or Zyrtec.  Patient does state he has not had any episodes since.  Denies pain, SOB, cough, fever, N/V/D, constipation, dysuria, hematuria.  CODE STATUS: DNR  PPS: 40% HOSPICE ELIGIBILITY/DIAGNOSIS: TBD  PHYSICAL EXAM: BP 122/68 HR 72 O2 97% on RA General: NAD, frail appearing, thin Cardiovascular: regular rate and rhythm Pulmonary:lung sounds clear; normal respiratory effort Abdomen: soft, nontender, + bowel sounds GU: no suprapubic tenderness Extremities:trace edema of feet and ankles, no joint deformities Neurological: Weakness; A&O to person and place  PAST MEDICAL HISTORY:  Past Medical History:  Diagnosis Date  . Abdominal aortic aneurysm (AAA)   . Actinic keratosis 07/31/2013  . ARF (acute renal failure) 2011   after TKR  . Benign prostatic hyperplasia   . Bladder cancer 03/26/2019  . Bradycardia 09/26/2019  . C. difficile diarrhea   . CKD (chronic kidney disease) 03/05/2016   stage 3, GFR 30-59 ml/min  . DVT (deep venous thrombosis) 1998   after left  knee arthroscopy  . Dysphagia 10/02/2018  . History of colonoscopy 2000  . Hypertension   . Hypothyroidism 2007   postop from thyroidectomy (cancer scare)   . Kidney stones    recurrent  . Major neurocognitive disorder due to Parkinson's disease without behavioral disturbance 10/30/2019  . Oral cancer 2004   graft from left thigh  . Osteoarthritis of both knees   . Parkinson's disease   .  Pulmonary embolism   . RBD (REM behavioral disorder) 10/24/2017  . 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 Tendonitis 2012   from quinolone  . Toe amputation status 2010  . Vitamin B12 deficiency 09/09/2014    SOCIAL HX:  Social History   Tobacco Use  . Smoking status: Former Smoker    Types: Cigars    Quit date: 04/27/2002    Years since quitting: 17.7  . Smokeless tobacco: Never Used  Substance Use Topics  . Alcohol use: Not Currently    Alcohol/week: 0.0 standard drinks    ALLERGIES:  Allergies  Allergen Reactions  . Quinolones Other (See Comments)    tendonitis     PERTINENT MEDICATIONS:  Outpatient Encounter Medications as of 01/21/2020  Medication Sig  . acetaminophen (TYLENOL) 325 MG tablet Take 2 tablets (650 mg total) by mouth every 6 (six) hours as needed for mild pain or moderate pain (or Fever >/= 101).  Marland Kitchen apixaban (ELIQUIS) 2.5 MG TABS tablet Take 1 tablet (2.5 mg total) by mouth 2 (two) times daily.  . carbidopa-levodopa (PARCOPA) 25-100 MG disintegrating tablet Take 1-2 tablets by mouth as directed. 2 tablets at 8 AM, 1 tablet at 11 AM, 2 tablets at 2 PM, 1 tablet at 6 PM  . DENTA 5000 PLUS 1.1 % CREA dental cream Place 1 application onto teeth daily.  Marland Kitchen levothyroxine (SYNTHROID) 112 MCG tablet Take 1 tablet (112 mcg total) by mouth daily before breakfast.   No facility-administered encounter medications on file as of 01/21/2020.      Anselmo Reihl Jenetta Downer, NP

## 2020-01-30 ENCOUNTER — Other Ambulatory Visit: Payer: Self-pay

## 2020-01-30 ENCOUNTER — Ambulatory Visit (INDEPENDENT_AMBULATORY_CARE_PROVIDER_SITE_OTHER): Payer: PPO | Admitting: Internal Medicine

## 2020-01-30 ENCOUNTER — Encounter: Payer: Self-pay | Admitting: Internal Medicine

## 2020-01-30 VITALS — BP 118/72 | HR 73 | Temp 98.0°F | Ht 70.0 in | Wt 162.0 lb

## 2020-01-30 DIAGNOSIS — R220 Localized swelling, mass and lump, head: Secondary | ICD-10-CM

## 2020-01-30 NOTE — Progress Notes (Signed)
Subjective:    Patient ID: Walter Walter, male    DOB: 11/14/1931, 86 y.o.   MRN: [id number]  HPI Here with wife due to swelling under his ears This visit occurred during the SARS-CoV-2 public health emergency.  Safety protocols were in place, including screening questions prior to the visit, additional usage of staff PPE, and extensive cleaning of exam room while observing appropriate contact time as indicated for disinfecting solutions.   Has hard feeling area in front of both ears (at the bottom) More on right than left No swelling in the neck Noticed it 1 month or so ago (maybe more) Seems to be worsening---bigger and ?harder  No change in swallowing--same issues but does okay with soft stuff Does have to clear his throat No drooling  Current Outpatient Medications on File Prior to Visit  Medication Sig Dispense Refill  . acetaminophen (TYLENOL) 325 MG tablet Take 2 tablets (650 mg total) by mouth every 6 (six) hours as needed for mild pain or moderate pain (or Fever >/= 101).    Marland Kitchen apixaban (ELIQUIS) 2.5 MG TABS tablet Take 1 tablet (2.5 mg total) by mouth 2 (two) times daily. 180 tablet 3  . carbidopa-levodopa (PARCOPA) 25-100 MG disintegrating tablet Take 1-2 tablets by mouth as directed. 2 tablets at 8 AM, 1 tablet at 11 AM, 2 tablets at 2 PM, 1 tablet at 6 PM    . DENTA 5000 PLUS 1.1 % CREA dental cream Place 1 application onto teeth daily.    Marland Kitchen levothyroxine (SYNTHROID) 112 MCG tablet Take 1 tablet (112 mcg total) by mouth daily before breakfast. 90 tablet 3   No current facility-administered medications on file prior to visit.    Allergies  Allergen Reactions  . Quinolones Other (See Comments)    tendonitis    Past Medical History:  Diagnosis Date  . Abdominal aortic aneurysm (AAA)   . Actinic keratosis 07/31/2013  . ARF (acute renal failure) 2011   after TKR  . Benign prostatic hyperplasia   . Bladder cancer 03/26/2019  . Bradycardia 09/26/2019  . C. difficile  diarrhea   . CKD (chronic kidney disease) 03/05/2016   stage 3, GFR 30-59 ml/min  . DVT (deep venous thrombosis) 1998   after left knee arthroscopy  . Dysphagia 10/02/2018  . History of colonoscopy 2000  . Hypertension   . Hypothyroidism 2007   postop from thyroidectomy (cancer scare)   . Kidney stones    recurrent  . Major neurocognitive disorder due to Parkinson's disease without behavioral disturbance 10/30/2019  . Oral cancer 2004   graft from left thigh  . Osteoarthritis of both knees   . Parkinson's disease   . Pulmonary embolism   . RBD (REM behavioral disorder) 10/24/2017  . 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 Tendonitis 2012   from quinolone  . Toe amputation status 2010  . Vitamin B12 deficiency 09/09/2014    Past Surgical History:  Procedure Laterality Date  . APPENDECTOMY    . CHOLECYSTECTOMY    . CYSTOSCOPY W/ RETROGRADES Bilateral 03/18/2019   Procedure: CYSTOSCOPY WITH RETROGRADE PYELOGRAM;  Surgeon: Hollice Espy, MD;  Location: [facility] ORS;  Service: Urology;  Laterality: Bilateral;  . CYSTOSCOPY/URETEROSCOPY/HOLMIUM LASER/STENT PLACEMENT Right 09/13/2017   Procedure: CYSTOSCOPY/URETEROSCOPY/HOLMIUM LASER/STENT PLACEMENT;  Surgeon: Hollice Espy, MD;  Location: [facility] ORS;  Service: Urology;  Laterality: Right;  . HERNIA REPAIR    . INTRAMEDULLARY (IM) NAIL INTERTROCHANTERIC Right 06/13/2017   Procedure: INTRAMEDULLARY (IM) NAIL  INTERTROCHANTRIC;  Surgeon: Hessie Knows, MD;  Location: [facility] ORS;  Service: Orthopedics;  Laterality: Right;  . LITHOTRIPSY  2009/2010   then stent and cystoscopic removal 2014  . Oral cancer removed Right 2004  . REPAIR ZENKER'S DIVERTICULA  2013  . THYROIDECTOMY  2007  . TOE AMPUTATION Right 2010   badly overlapping other toe  . TOTAL KNEE ARTHROPLASTY Left   . TRANSURETHRAL RESECTION OF BLADDER TUMOR WITH MITOMYCIN-C N/A 03/18/2019   Procedure: TRANSURETHRAL RESECTION OF BLADDER TUMOR WITH  Gemcitabine;  Surgeon: Hollice Espy, MD;  Location: [facility] ORS;  Service: Urology;  Laterality: N/A;  . TRANSURETHRAL RESECTION OF PROSTATE  2011  . UMBILICAL HERNIA REPAIR  2007    Family History  Problem Relation Age of Onset  . Dementia Mother   . Stroke Father   . Pulmonary disease Brother   . Heart disease Neg Hx   . Diabetes Neg Hx     Social History   Socioeconomic History  . Marital status: Married    Spouse name: Not on file  . Number of children: 3  . Years of education: 16  . Highest education level: Bachelor's degree (e.g., BA, AB, BS)  Occupational History  . 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 Smoking status: Former Smoker    Types: Cigars    Quit date: 04/27/2002    Years since quitting: 17.7  . Smokeless tobacco: Never Used  Vaping Use  . Vaping Use: Never used  Substance and Sexual Activity  . Alcohol use: Not Currently    Alcohol/week: 0.0 standard drinks  . Drug use: No  . Sexual activity: Not Currently  Other Topics Concern  . Not on file  Social History Narrative   Son works for [employer]   1 daughter in [place], other in [place] living will   Wife, then son, would be health care [place]   Has DNR   No tube feeds if cognitively unaware   Has MOST form done 12/21/18   Social Determinants of Health   Financial Resource Strain:   . Difficulty of Paying Living Expenses: Not on file  Food Insecurity:   . Worried About Charity fundraiser in the Last Year: Not on file  . Ran Out of Food in the Last Year: Not on file  Transportation Needs:   . Lack of Transportation (Medical): Not on file  . Lack of Transportation (Non-Medical): Not on file  Physical Activity:   . Days of Exercise per Week: Not on file  . Minutes of Exercise per Session: Not on file  Stress:   . Feeling of Stress : Not on file  Social Connections:   . Frequency of Communication with Friends  and Family: Not on file  . Frequency of Social Gatherings with Friends and Family: Not on file  . Attends Religious Services: Not on file  . Active Member of Clubs or Organizations: Not on file  . Attends Archivist Meetings: Not on file  . Marital Status: Not on file  Intimate Partner Violence:   . Fear of Current or Ex-Partner: Not on file  . Emotionally Abused: Not on file  . Physically Abused: Not on file  . Sexually Abused: Not on file   Review of Systems No fever Doesn't feel sick    Objective:   Physical Exam HENT:     Head:  Comments: ~1cm firm moveable mass at the bottom of right pinna (over the mandible) Not parotid and no enlarged saliva glands    Ears:     Comments: Cerumen bilateral    Mouth/Throat:     Comments: Slightly visible right tonsillar tissue Lymphadenopathy:     Head:     Right side of head: No submental, submandibular, tonsillar, preauricular, posterior auricular or occipital adenopathy.     Left side of head: No submental, submandibular, tonsillar, preauricular, posterior auricular or occipital adenopathy.  Neurological:     Mental Status: He is alert.            Assessment & Plan:

## 2020-01-30 NOTE — Assessment & Plan Note (Signed)
Not parotid Seems like separate from bone or mouth----?cyst (but seems solid) Not fixed Likely nothing we want to take action on---but it makes sense to have ENT evaluate (certainly could be removed for pathology if concern--wouldn't be big procedure) Wife will set up with ENT if progresses or they have increased concern

## 2020-01-31 DIAGNOSIS — I129 Hypertensive chronic kidney disease with stage 1 through stage 4 chronic kidney disease, or unspecified chronic kidney disease: Secondary | ICD-10-CM | POA: Diagnosis not present

## 2020-01-31 DIAGNOSIS — R2681 Unsteadiness on feet: Secondary | ICD-10-CM | POA: Diagnosis not present

## 2020-01-31 DIAGNOSIS — R262 Difficulty in walking, not elsewhere classified: Secondary | ICD-10-CM | POA: Diagnosis not present

## 2020-01-31 DIAGNOSIS — R278 Other lack of coordination: Secondary | ICD-10-CM | POA: Diagnosis not present

## 2020-01-31 DIAGNOSIS — Z86718 Personal history of other venous thrombosis and embolism: Secondary | ICD-10-CM | POA: Diagnosis not present

## 2020-01-31 DIAGNOSIS — I2782 Chronic pulmonary embolism: Secondary | ICD-10-CM | POA: Diagnosis not present

## 2020-01-31 DIAGNOSIS — Z741 Need for assistance with personal care: Secondary | ICD-10-CM | POA: Diagnosis not present

## 2020-01-31 DIAGNOSIS — I2692 Saddle embolus of pulmonary artery without acute cor pulmonale: Secondary | ICD-10-CM | POA: Diagnosis not present

## 2020-01-31 DIAGNOSIS — M6281 Muscle weakness (generalized): Secondary | ICD-10-CM | POA: Diagnosis not present

## 2020-01-31 DIAGNOSIS — R4189 Other symptoms and signs involving cognitive functions and awareness: Secondary | ICD-10-CM | POA: Diagnosis not present

## 2020-01-31 DIAGNOSIS — G20 Parkinson's disease: Secondary | ICD-10-CM | POA: Diagnosis not present

## 2020-02-16 ENCOUNTER — Other Ambulatory Visit: Payer: Self-pay | Admitting: Neurology

## 2020-03-09 NOTE — Progress Notes (Signed)
Virtual Visit Via Video   The purpose of this virtual visit is to provide medical care while limiting exposure to the novel coronavirus.    Consent was obtained for video visit:  Yes.   Answered questions that patient had about telehealth interaction:  Yes.   I discussed the limitations, risks, security and privacy concerns of performing an evaluation and management service by telemedicine. I also discussed with the patient that there may be a patient responsible charge related to this service. The patient expressed understanding and agreed to proceed.  Pt location: Home Physician Location: office Name of referring provider:  Venia Carbon, MD I connected with Walter [name] at patients initiation/request on 03/12/2020 at 10:45 AM EST by video enabled telemedicine application and verified that I am speaking with the correct person using two identifiers. Pt MRN:  [id number] Pt DOB:  November 13, 1931 Video Participants:  Walter [name];  Wife supplements hx  Assessment/Plan:   1.  Parkinsons Disease  -Take Parcopa 25/100 (patient cannot even swallow medication with applesauce), 2 tablets at 8 AM/1 tablet at 11 AM/2 tablets at 2 PM/1 tablet at 6 PM.  Refilled today  -discussed regular daily schedule, exercise, timing of naps, etc  -Patient under the care of palliative care.  2.  Dysphagia  -Biggest issue here is a Zenker's diverticula.  He is following gastroenterology, but they do not think they can do anything more.  -Diet is modified to mechanical soft.  3.  PDD  -Patient had neurocognitive testing with Dr. Melvyn Novas in August, 2021, with evidence of PDD Subjective:   Walter [name] was seen today in follow up for Parkinsons disease.  My previous records were reviewed prior to todays visit as well as outside records available to me.  We changed him from carbidopa/levodopa 25/250 last visit to [place], primarily because of the peripheral breakdown/less carbidopa in his previous version.  He was having  a lot of sleepiness as well.  He was also chewing his medication and could not even swallow medication with applesauce, so we changed him to [place].  He finds it is much better and it is working well.  Pt denies falls. Still walks to the mailbox daily and still does chores daily (does dishes/dishwasher daily).  Pt denies lightheadedness, near syncope.  No hallucinations.  Mood has been good.  Patient had neurocognitive testing with Dr. Melvyn Novas in August.  There was evidence of Parkinson's dementia.  He was in the emergency room in October for an episode of acute, generalized weakness.  He is no longer exercising.  PT has been completed.  Current prescribed movement disorder medications: Parcopa 25/100, 2 tablets at 8 AM/1 tablet at 11 AM/2 tablets at 2 PM/1 tablet at 6 PM   PREVIOUS MEDICATIONS: Carbidopa/levodopa 25/250  ALLERGIES:   Allergies  Allergen Reactions  . Quinolones Other (See Comments)    tendonitis    CURRENT MEDICATIONS:  Outpatient Encounter Medications as of 03/12/2020  Medication Sig  . acetaminophen (TYLENOL) 325 MG tablet Take 2 tablets (650 mg total) by mouth every 6 (six) hours as needed for mild pain or moderate pain (or Fever >/= 101).  Marland Kitchen apixaban (ELIQUIS) 2.5 MG TABS tablet Take 1 tablet (2.5 mg total) by mouth 2 (two) times daily.  . DENTA 5000 PLUS 1.1 % CREA dental cream Place 1 application onto teeth daily.  Marland Kitchen levothyroxine (SYNTHROID) 112 MCG tablet Take 1 tablet (112 mcg total) by mouth daily before breakfast.  . [DISCONTINUED] carbidopa-levodopa (PARCOPA) 25-100 MG disintegrating  tablet 2 TABLETS AT 8 AM, 1 TABLET AT 11 AM, 2 TABLETS AT 2 PM, 1 TABLET AT 6 PM  . carbidopa-levodopa (PARCOPA) 25-100 MG disintegrating tablet 2 TABLETS AT 8 AM, 1 TABLET AT 11 AM, 2 TABLETS AT 2 PM, 1 TABLET AT 6 PM   No facility-administered encounter medications on file as of 03/12/2020.    Objective:   PHYSICAL EXAMINATION:    VITALS:   Vitals:   03/12/20 0927  Weight:  168 lb (76.2 kg)  Height: 5\' 10"  (1.778 m)    GEN:  The patient appears stated age and is in NAD.  Neurological examination:  Orientation: The patient is alert and oriented to person/place. Cranial nerves: There is good facial symmetry. There is facial hypomimia.  The speech is fluent and clear. Soft palate rises symmetrically and there is no tongue deviation. Hearing is intact to conversational tone. Motor: Strength is at least antigravity x 4.   Shoulder shrug is equal and symmetric.  There is no pronator drift.  Movement examination: Tone: unable Abnormal movements: none seen but hands not seen at rest Coordination:  There is decremation with RAM's, with hand opening and closing and finger taps bilaterally Gait and Station: The patient states unable to show me/position camera properly   I have reviewed and interpreted the following labs independently    Chemistry      Component Value Date/Time   NA 141 12/13/2019 2325   K 3.9 12/13/2019 2325   CL 102 12/13/2019 2325   CO2 25 12/13/2019 2325   BUN 24 (H) 12/13/2019 2325   CREATININE 1.42 (H) 12/13/2019 2325      Component Value Date/Time   CALCIUM 9.3 12/13/2019 2325   ALKPHOS 64 09/04/2019 1315   AST 9 09/04/2019 1315   ALT 4 09/04/2019 1315   BILITOT 1.0 09/04/2019 1315       Lab Results  Component Value Date   WBC 5.2 12/13/2019   HGB 14.7 12/13/2019   HCT 44.4 12/13/2019   MCV 92.7 12/13/2019   PLT 201 12/13/2019    Lab Results  Component Value Date   TSH 0.569 11/11/2018   Follow up Instructions      -I discussed the assessment and treatment plan with the patient. The patient was provided an opportunity to ask questions and all were answered. The patient agreed with the plan and demonstrated an understanding of the instructions.   The patient was advised to call back or seek an in-person evaluation if the symptoms worsen or if the condition fails to improve as anticipated.    Total time spent on  today's visit was 5minutes, including both face-to-face time and nonface-to-face time.  Time included that spent on review of records (prior notes available to me/labs/imaging if pertinent), discussing treatment and goals, answering patient's questions and coordinating   Cc:  Venia Carbon, MD

## 2020-03-12 ENCOUNTER — Encounter: Payer: Self-pay | Admitting: Neurology

## 2020-03-12 ENCOUNTER — Other Ambulatory Visit: Payer: Self-pay

## 2020-03-12 ENCOUNTER — Telehealth (INDEPENDENT_AMBULATORY_CARE_PROVIDER_SITE_OTHER): Payer: PPO | Admitting: Neurology

## 2020-03-12 VITALS — Ht 70.0 in | Wt 168.0 lb

## 2020-03-12 DIAGNOSIS — F0280 Dementia in other diseases classified elsewhere without behavioral disturbance: Secondary | ICD-10-CM

## 2020-03-12 DIAGNOSIS — G20 Parkinson's disease: Secondary | ICD-10-CM

## 2020-03-12 MED ORDER — CARBIDOPA-LEVODOPA 25-100 MG PO TBDP
ORAL_TABLET | ORAL | 1 refills | Status: DC
Start: 2020-03-12 — End: 2020-09-17

## 2020-03-19 ENCOUNTER — Other Ambulatory Visit: Payer: PPO | Admitting: Adult Health Nurse Practitioner

## 2020-03-19 ENCOUNTER — Other Ambulatory Visit: Payer: Self-pay

## 2020-03-19 DIAGNOSIS — Z515 Encounter for palliative care: Secondary | ICD-10-CM

## 2020-03-19 DIAGNOSIS — K5900 Constipation, unspecified: Secondary | ICD-10-CM

## 2020-03-19 DIAGNOSIS — C679 Malignant neoplasm of bladder, unspecified: Secondary | ICD-10-CM

## 2020-03-19 DIAGNOSIS — G20 Parkinson's disease: Secondary | ICD-10-CM | POA: Diagnosis not present

## 2020-03-19 DIAGNOSIS — R220 Localized swelling, mass and lump, head: Secondary | ICD-10-CM

## 2020-03-19 NOTE — Progress Notes (Signed)
[facility] Consult Note Telephone: [phone]  Fax: [phone]  PATIENT NAME: Walter [name] DOB: 30-Apr-1931 MRN: [id number]  PRIMARY CARE PROVIDER:   Venia Carbon, MD  REFERRING PROVIDER:  Venia Carbon, MD [place],  [place] [address]  RESPONSIBLE PARTY:  Wife, Jamaree Hosier [phone]  Chief complaint:  Follow up palliative visit    RECOMMENDATIONS and PLAN: 1.Advanced care planning. Patient is a DNR.   2.  Parkinson's disease.  Continues to use walker to ambulate.  Is able to walk to mailbox daily.  No longer getting PT.  No reports of falls. Doing well with current sinemet dose.  Continue follow up and recommendations with neurology  3.  Constipation.  Wife inquiring about giving him a Fleets enema that they have at home.  Suggested waiting to give the Miralax for at 3 days to see if this will help resolve the constipation after having diarrhea.  4.  Facial mass.  Already seen by PCP with no concerns.  Did go over things to look out for for future follow up, such as pain or discharge.    Palliative will continue to monitor for symptom management/decline and make recommendations as needed. Next appointment is in8weeks. Encouraged to call sooner with any concerns.  I spent 40 minutes providing this consultation. More than 50% of the time in this consultation was spent coordinating communication.   HISTORY OF PRESENT ILLNESS:  Walter [name] is a 87 y.o. year old male with multiple medical problems including Parkinson's disease, HTN, BPH, h/o oral cancer, hypothyroidism. Palliative Care was asked to help address goals of care. Patient has facial mass below right ear.   It is firm with no discharge, pain, or itching.  Has been seen by PCP with no concerns at this time but can see ENT if becomes bothersome.  Patient stayed at memory care at [facility] during inclement weather.  He developed diarrhea  believed to be from change in diet.  This has resolved but has been having some constipation.  Wife states that he is having very small stools.  No blood in stool noted. Has just started giving him his Miralax yesterday.  She is inquiring about giving him a Fleets enema that she has.  Rest of 10 point ROS asked and negative.    CODE STATUS: DNR  PPS: 40% HOSPICE ELIGIBILITY/DIAGNOSIS: TBD  PHYSICAL EXAM: BP 128/76 HR50O2 98% on RA General: NAD, frail appearing, thin Eyes: sclera anicteric and noninjected with no discharge noted ENMT: moist mucous membranes Cardiovascular: regular rate and rhythm Pulmonary:lung sounds clear; normal respiratory effort Abdomen: soft, nontender, + bowel sounds GU: no suprapubic tenderness Extremities:trace edema of feet and ankles, no joint deformities Neurological: Weakness; A&O to person and place  PAST MEDICAL HISTORY:  Past Medical History:  Diagnosis Date  . Abdominal aortic aneurysm (AAA)   . Actinic keratosis 07/31/2013  . ARF (acute renal failure) 2011   after TKR  . Benign prostatic hyperplasia   . Bladder cancer 03/26/2019  . Bradycardia 09/26/2019  . C. difficile diarrhea   . CKD (chronic kidney disease) 03/05/2016   stage 3, GFR 30-59 ml/min  . DVT (deep venous thrombosis) 1998   after left knee arthroscopy  . Dysphagia 10/02/2018  . History of colonoscopy 2000  . Hypertension   . Hypothyroidism 2007   postop from thyroidectomy (cancer scare)   . Kidney stones    recurrent  . Major neurocognitive disorder due  to Parkinson's disease without behavioral disturbance 10/30/2019  . Oral cancer 2004   graft from left thigh  . Osteoarthritis of both knees   . Parkinson's disease   . Pulmonary embolism   . RBD (REM behavioral disorder) 10/24/2017  . 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 Tendonitis 2012   from quinolone  . Toe amputation status 2010  . Vitamin B12 deficiency 09/09/2014    SOCIAL  HX:  Social History   Tobacco Use  . Smoking status: Former Smoker    Types: Cigars    Quit date: 04/27/2002    Years since quitting: 17.9  . Smokeless tobacco: Never Used  Substance Use Topics  . Alcohol use: Not Currently    Alcohol/week: 0.0 standard drinks    ALLERGIES:  Allergies  Allergen Reactions  . Quinolones Other (See Comments)    tendonitis     PERTINENT MEDICATIONS:  Outpatient Encounter Medications as of 03/19/2020  Medication Sig  . acetaminophen (TYLENOL) 325 MG tablet Take 2 tablets (650 mg total) by mouth every 6 (six) hours as needed for mild pain or moderate pain (or Fever >/= 101).  Marland Kitchen apixaban (ELIQUIS) 2.5 MG TABS tablet Take 1 tablet (2.5 mg total) by mouth 2 (two) times daily.  . carbidopa-levodopa (PARCOPA) 25-100 MG disintegrating tablet 2 TABLETS AT 8 AM, 1 TABLET AT 11 AM, 2 TABLETS AT 2 PM, 1 TABLET AT 6 PM  . DENTA 5000 PLUS 1.1 % CREA dental cream Place 1 application onto teeth daily.  Marland Kitchen levothyroxine (SYNTHROID) 112 MCG tablet Take 1 tablet (112 mcg total) by mouth daily before breakfast.   No facility-administered encounter medications on file as of 03/19/2020.    PHYSICAL EXAM:   General: NAD, frail appearing, thin Cardiovascular: regular rate and rhythm Pulmonary: clear ant fields Abdomen: soft, nontender, + bowel sounds GU: no suprapubic tenderness Extremities: no edema, no joint deformities Skin: no rashes Neurological: Weakness but otherwise nonfocal  Claudetta Sallie Jenetta Downer, NP

## 2020-03-26 ENCOUNTER — Telehealth: Payer: Self-pay | Admitting: Internal Medicine

## 2020-03-26 MED ORDER — SILVER SULFADIAZINE 1 % EX CREA: 1.0000 "application " | TOPICAL_CREAM | Freq: Every day | CUTANEOUS | 1 refills | Status: DC

## 2020-03-26 NOTE — Telephone Encounter (Signed)
Wife is in. Notes recurrent small buttock ulcer Bleeds at times Using OTC diaper cream  Discussed trying silvadene---to prevent infection and protect against friction

## 2020-04-03 ENCOUNTER — Other Ambulatory Visit: Payer: Self-pay | Admitting: Internal Medicine

## 2020-04-13 ENCOUNTER — Telehealth: Payer: Self-pay | Admitting: Adult Health Nurse Practitioner

## 2020-04-13 NOTE — Telephone Encounter (Signed)
Returned wife's VM.  She needs to reschedule appointment for 05/18/20.  Rescheduled for 05/25/20 @ 3:30pm Adric Wrede K. Olena Heckle NP

## 2020-05-18 ENCOUNTER — Other Ambulatory Visit: Payer: PPO | Admitting: Adult Health Nurse Practitioner

## 2020-05-25 ENCOUNTER — Other Ambulatory Visit: Payer: PPO | Admitting: Adult Health Nurse Practitioner

## 2020-05-25 ENCOUNTER — Other Ambulatory Visit: Payer: Self-pay

## 2020-05-25 DIAGNOSIS — C679 Malignant neoplasm of bladder, unspecified: Secondary | ICD-10-CM | POA: Diagnosis not present

## 2020-05-25 DIAGNOSIS — R1310 Dysphagia, unspecified: Secondary | ICD-10-CM | POA: Diagnosis not present

## 2020-05-25 DIAGNOSIS — Z515 Encounter for palliative care: Secondary | ICD-10-CM

## 2020-05-25 DIAGNOSIS — G20 Parkinson's disease: Secondary | ICD-10-CM

## 2020-05-25 NOTE — Progress Notes (Signed)
[facility] Consult Note Telephone: [phone]  Fax: [phone]  PATIENT NAME: Walter [name] DOB: 04-20-1931 MRN: [id number]  PRIMARY CARE PROVIDER:   Venia Carbon, MD  REFERRING PROVIDER:  Venia Carbon, MD [place],  [place] [address]  RESPONSIBLE PARTY:   Wife, Reginal Wojcicki [phone]  Chief complaint: Follow up palliative visit   RECOMMENDATIONS and PLAN: 1.Advanced care planning. Patient is a DNR.   2.  Parkinson's disease.  Patient due is doing well on current dose of Sinemet.  Continue follow-up and recommendations by neurology  3.  Dysphagia/PCM.  Patient is doing well with pured meals that he orders from [employer].  His current weight today is 158 pounds.  He is normally baseline at around 160.  Continue pured diet.  4.  Bladder cancer.  Patient does follow-up with urology.  He is not experiencing any urinary changes with frequency, urgency, dysuria, hematuria.  Continue follow-up and recommendations by urology  Overall patient is stable at this time.  He has not had any falls, infections, hospitalizations since last visit.  Palliative will continue to monitor for symptom management/decline and make recommendations as needed. Next appointment is in8weeks. Encouraged to call sooner with any concerns.   HISTORY OF PRESENT ILLNESS:  Walter [name] is a 87 y.o. year old male with multiple medical problems including Parkinson's disease, HTN, BPH, h/o oral cancer, hypothyroidism. Palliative Care was asked to help address goals of care.  Patient has no new concerns today.  Wife does state he is having some increased forgetfulness with the day of the week and who may be coming that day.  Otherwise she has no new concerns for her husband.  Patient has obtained skin tear to left shin in which she has received care at the clinic at [facility] and has returned visit in 2 days.  He is unsure how he got  the skin tear and does not recall any trauma.  Denies pain with the skin tear.  Patient denies pain, headaches, dizziness, increased S OB or cough, constipation, N/V/D, chest pain, heart palpitations, dysuria, hematuria.  CODE STATUS: DNR  PPS: 40% HOSPICE ELIGIBILITY/DIAGNOSIS: TBD  PHYSICAL EXAM: BP 128/70HR68O2 98% on RA  Weight 158 General: NAD, frail appearing, thin Eyes: sclera anicteric and noninjected with no discharge noted ENMT: moist mucous membranes Cardiovascular: regular rate and rhythm Pulmonary:lung sounds clear; normal respiratory effort Abdomen: soft, nontender, + bowel sounds GU: no suprapubic tenderness Extremities:trace edema of feet and ankles, no joint deformities Skin:  Skin tear to left shin did not remove bandage today; no warmth to touch or ertyhema noted Neurological: Weakness; A&O to person and place  PAST MEDICAL HISTORY:  Past Medical History:  Diagnosis Date  . Abdominal aortic aneurysm (AAA)   . Actinic keratosis 07/31/2013  . ARF (acute renal failure) 2011   after TKR  . Benign prostatic hyperplasia   . Bladder cancer 03/26/2019  . Bradycardia 09/26/2019  . C. difficile diarrhea   . CKD (chronic kidney disease) 03/05/2016   stage 3, GFR 30-59 ml/min  . DVT (deep venous thrombosis) 1998   after left knee arthroscopy  . Dysphagia 10/02/2018  . History of colonoscopy 2000  . Hypertension   . Hypothyroidism 2007   postop from thyroidectomy (cancer scare)   . Kidney stones    recurrent  . Major neurocognitive disorder due to Parkinson's disease without behavioral disturbance 10/30/2019  . Oral cancer 2004   graft  from left thigh  . Osteoarthritis of both knees   . Parkinson's disease   . Pulmonary embolism   . RBD (REM behavioral disorder) 10/24/2017  . 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 Tendonitis 2012   from quinolone  . Toe amputation status 2010  . Vitamin B12 deficiency 09/09/2014    SOCIAL  HX:  Social History   Tobacco Use  . Smoking status: Former Smoker    Types: Cigars    Quit date: 04/27/2002    Years since quitting: 18.0  . Smokeless tobacco: Never Used  Substance Use Topics  . Alcohol use: Not Currently    Alcohol/week: 0.0 standard drinks    ALLERGIES:  Allergies  Allergen Reactions  . Quinolones Other (See Comments)    tendonitis     PERTINENT MEDICATIONS:  Outpatient Encounter Medications as of 05/25/2020  Medication Sig  . acetaminophen (TYLENOL) 325 MG tablet Take 2 tablets (650 mg total) by mouth every 6 (six) hours as needed for mild pain or moderate pain (or Fever >/= 101).  . carbidopa-levodopa (PARCOPA) 25-100 MG disintegrating tablet 2 TABLETS AT 8 AM, 1 TABLET AT 11 AM, 2 TABLETS AT 2 PM, 1 TABLET AT 6 PM  . DENTA 5000 PLUS 1.1 % CREA dental cream Place 1 application onto teeth daily.  Marland Kitchen ELIQUIS 2.5 MG TABS tablet TAKE 1 TABLET BY MOUTH TWICE A DAY  . levothyroxine (SYNTHROID) 112 MCG tablet Take 1 tablet (112 mcg total) by mouth daily before breakfast.  . silver sulfADIAZINE (SILVADENE) 1 % cream Apply 1 application topically daily.   No facility-administered encounter medications on file as of 05/25/2020.     Belmira Daley Jenetta Downer, NP

## 2020-06-04 DIAGNOSIS — H2513 Age-related nuclear cataract, bilateral: Secondary | ICD-10-CM | POA: Diagnosis not present

## 2020-06-04 DIAGNOSIS — H353131 Nonexudative age-related macular degeneration, bilateral, early dry stage: Secondary | ICD-10-CM | POA: Diagnosis not present

## 2020-07-19 IMAGING — RF DG SWALLOWING FUNCTION
12 of 24 series · 12 of 24 positions shown · non-contrast
Comparison: 12/11/2015

CLINICAL DATA: Dysphagia. Cough/GE reflux disease/other secondary
diagnosis

EXAM:
MODIFIED BARIUM SWALLOW
TECHNIQUE: Different consistencies of barium were administered orally to the
patient by the Speech Pathologist. Imaging of the pharynx was
performed in the lateral projection. The radiologist was present in
the fluoroscopy room for this study, providing personal supervision.
FLUOROSCOPY TIME:  Fluoroscopy Time:  2 minutes 44 seconds
Number of Acquired Spot Images: 0

[Series 2: run · 1 of 115 frames shown (1 of 12)]
[frame 1/115]
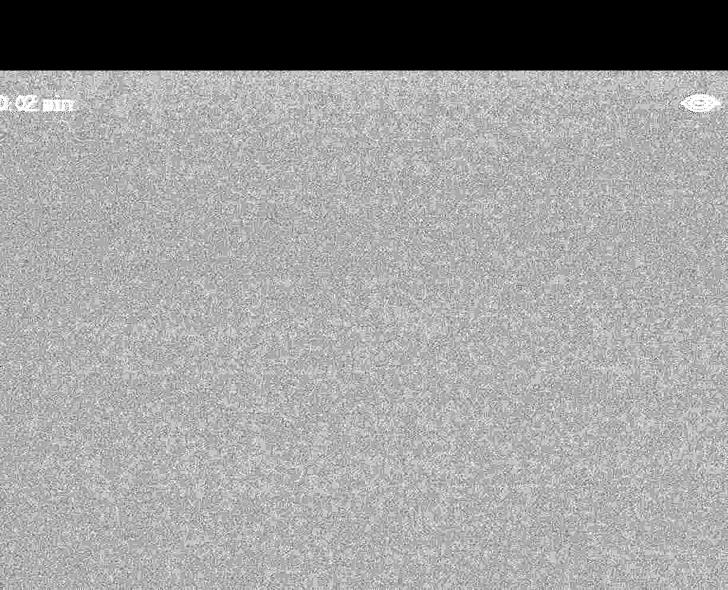

[Series 4: run · 1 of 132 frames shown (2 of 12)]
[frame 1/132]
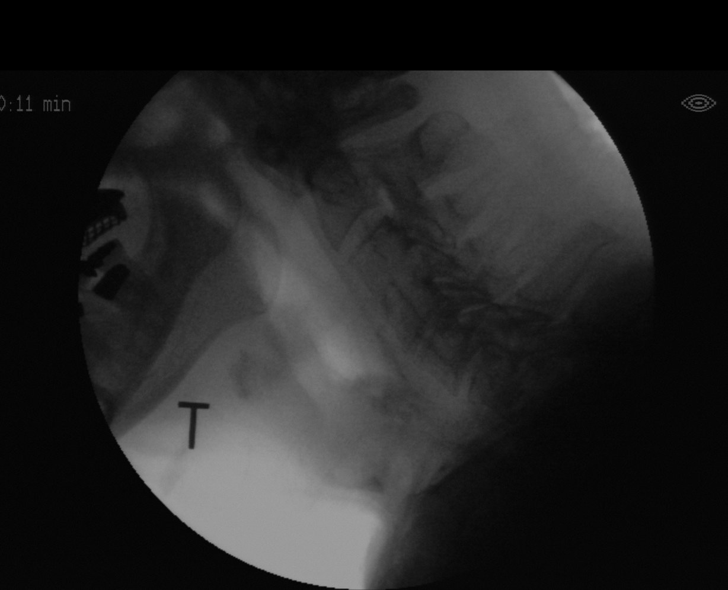

[Series 6: run · 1 of 288 frames shown (3 of 12)]
[frame 78/288]
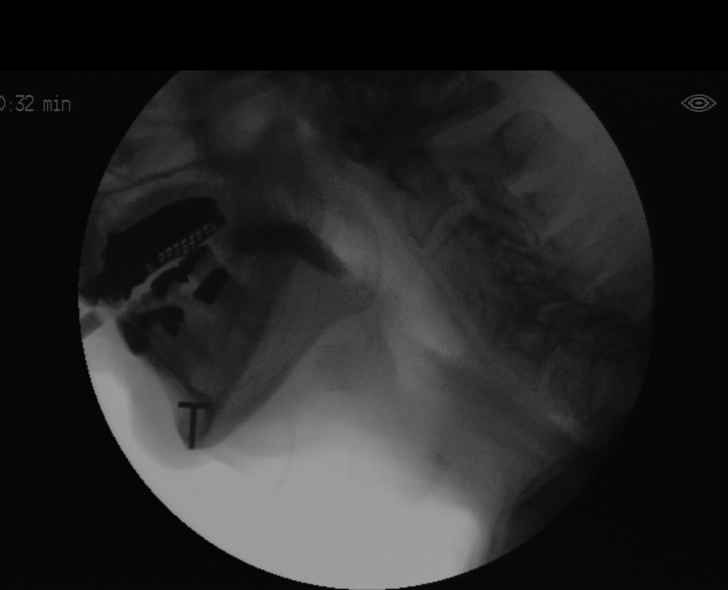

[Series 8: run · 1 of 156 frames shown (4 of 12)]
[frame 76/156]
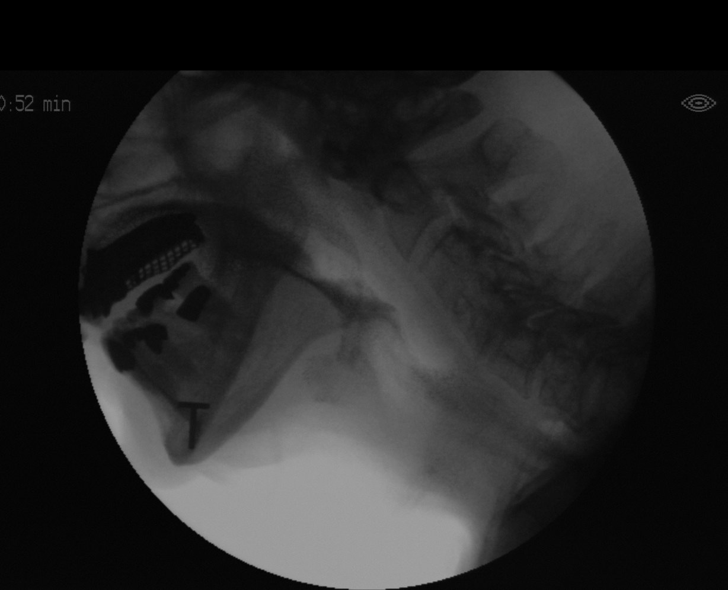

[Series 10: run · 1 of 164 frames shown (5 of 12)]
[frame 33/164]
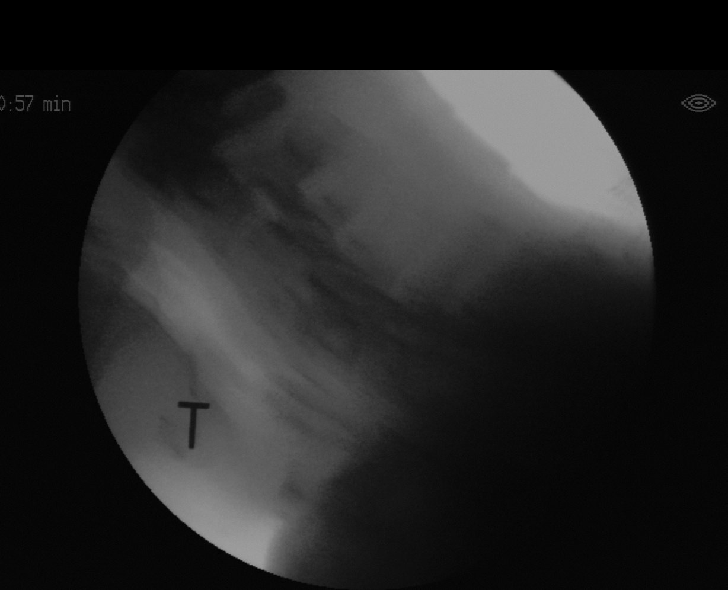

[Series 12: run · 1 of 61 frames shown (6 of 12)]
[frame 10/61]
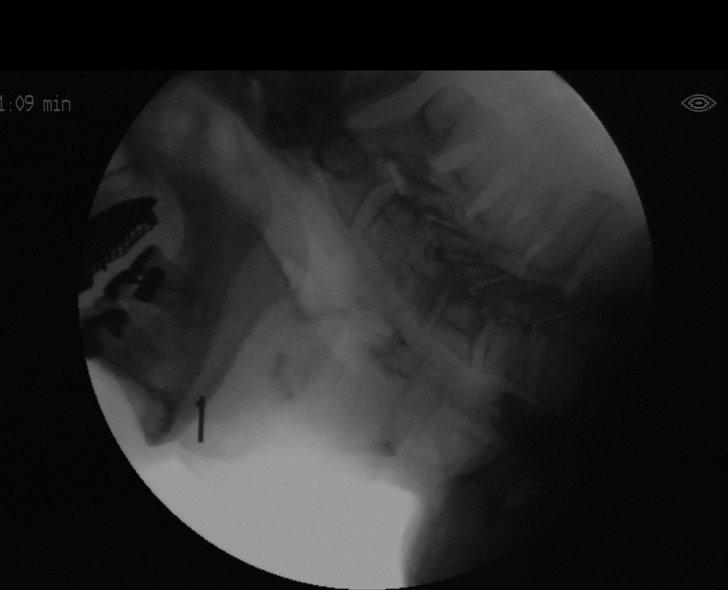

[Series 14: run · 1 of 227 frames shown (7 of 12)]
[frame 114/227]
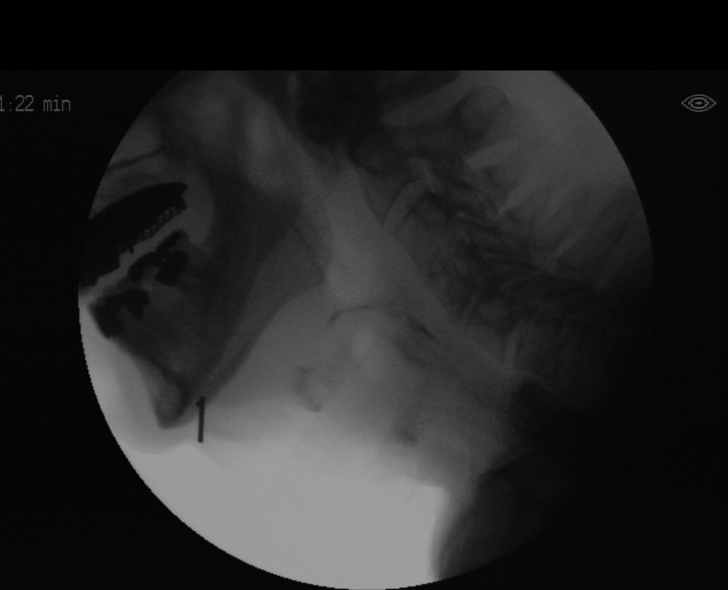

[Series 16: run · 1 of 184 frames shown (8 of 12)]
[frame 116/184]
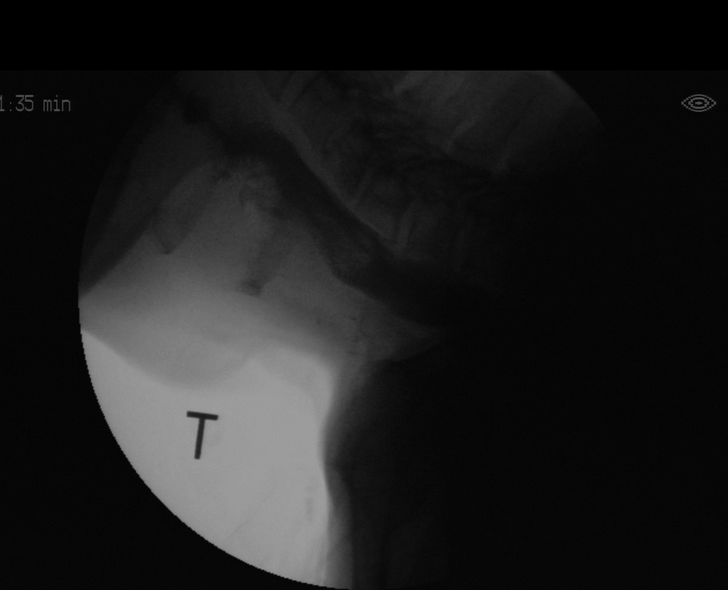

[Series 18: run · 1 of 260 frames shown (9 of 12)]
[frame 131/260]
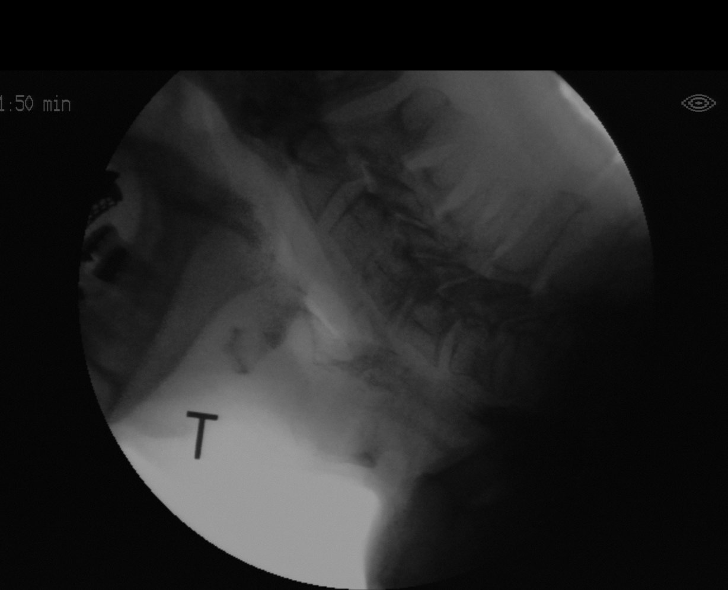

[Series 20: run · 1 of 141 frames shown (10 of 12)]
[frame 91/141]
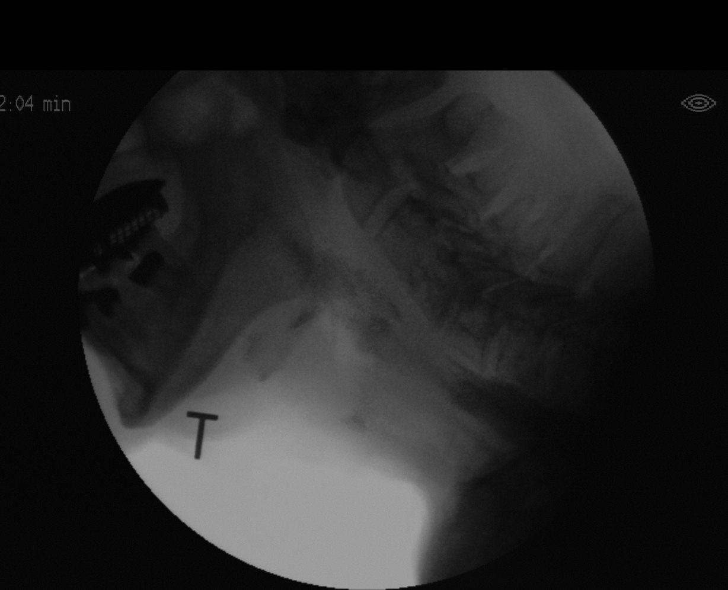

[Series 22: run · 1 of 211 frames shown (11 of 12)]
[frame 180/211]
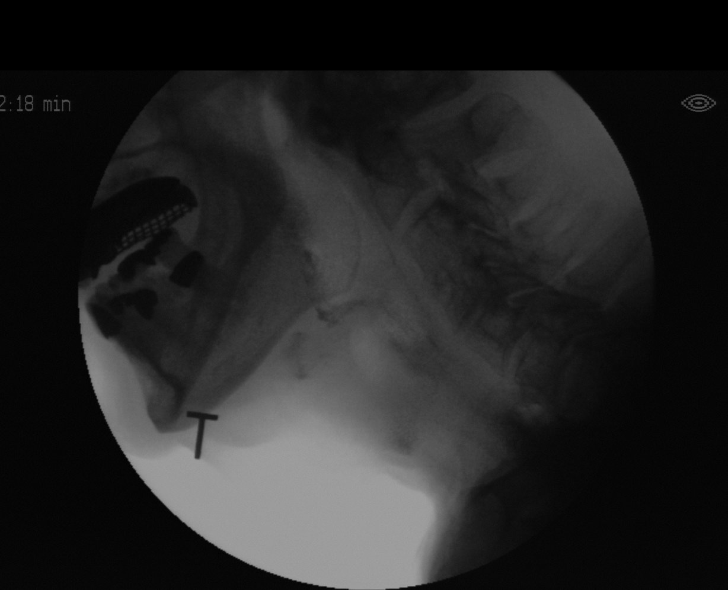

[Series 24: run · 1 of 8 frames shown (12 of 12)]
[frame 7/8]
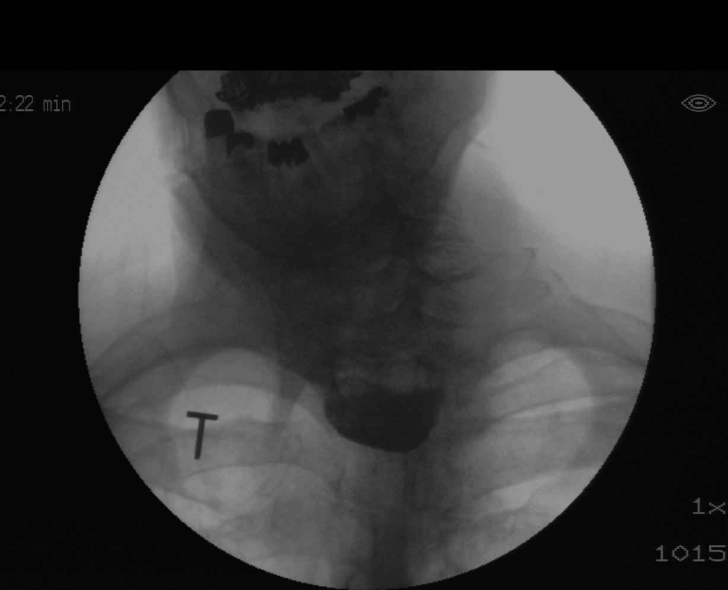

[12 of 24 positions shown; findings below may reference images not displayed]

FINDINGS: Scout images demonstrate spinal degenerative changes as before.

Swallow study performed in conjunction with speech pathology.
Swallowing performed with thin liquids and puree consistency. There
was penetration without aspiration common normal soft palate
elevation, hyoid elevation and epiglottic inversion.

Signs of residual Bittle diverticulum not significantly changed
dating back to 4224.
IMPRESSION: No significant swallow dysfunction with evidence of Bittle
diverticulum as before.

Please refer to the Speech Pathologists report for complete details
and recommendations.

## 2020-07-21 ENCOUNTER — Other Ambulatory Visit: Payer: Self-pay

## 2020-07-21 ENCOUNTER — Emergency Department: Payer: PPO

## 2020-07-21 ENCOUNTER — Telehealth: Payer: Self-pay

## 2020-07-21 ENCOUNTER — Emergency Department
Admission: EM | Admit: 2020-07-21 | Discharge: 2020-07-21 | Disposition: A | Discharge status: Home / Self Care | Payer: PPO | Attending: Emergency Medicine | Admitting: Emergency Medicine

## 2020-07-21 DIAGNOSIS — Z7901 Long term (current) use of anticoagulants: Secondary | ICD-10-CM | POA: Insufficient documentation

## 2020-07-21 DIAGNOSIS — Z85818 Personal history of malignant neoplasm of other sites of lip, oral cavity, and pharynx: Secondary | ICD-10-CM | POA: Diagnosis not present

## 2020-07-21 DIAGNOSIS — E039 Hypothyroidism, unspecified: Secondary | ICD-10-CM | POA: Insufficient documentation

## 2020-07-21 DIAGNOSIS — G20 Parkinson's disease: Secondary | ICD-10-CM | POA: Diagnosis not present

## 2020-07-21 DIAGNOSIS — I129 Hypertensive chronic kidney disease with stage 1 through stage 4 chronic kidney disease, or unspecified chronic kidney disease: Secondary | ICD-10-CM | POA: Diagnosis not present

## 2020-07-21 DIAGNOSIS — R1312 Dysphagia, oropharyngeal phase: Secondary | ICD-10-CM | POA: Insufficient documentation

## 2020-07-21 DIAGNOSIS — N183 Chronic kidney disease, stage 3 unspecified: Secondary | ICD-10-CM | POA: Diagnosis not present

## 2020-07-21 DIAGNOSIS — Z87891 Personal history of nicotine dependence: Secondary | ICD-10-CM | POA: Diagnosis not present

## 2020-07-21 DIAGNOSIS — Z8551 Personal history of malignant neoplasm of bladder: Secondary | ICD-10-CM | POA: Insufficient documentation

## 2020-07-21 DIAGNOSIS — J988 Other specified respiratory disorders: Secondary | ICD-10-CM | POA: Insufficient documentation

## 2020-07-21 DIAGNOSIS — Z79899 Other long term (current) drug therapy: Secondary | ICD-10-CM | POA: Insufficient documentation

## 2020-07-21 DIAGNOSIS — Z96652 Presence of left artificial knee joint: Secondary | ICD-10-CM | POA: Diagnosis not present

## 2020-07-21 DIAGNOSIS — R1310 Dysphagia, unspecified: Secondary | ICD-10-CM | POA: Diagnosis not present

## 2020-07-21 DIAGNOSIS — M419 Scoliosis, unspecified: Secondary | ICD-10-CM | POA: Diagnosis not present

## 2020-07-21 NOTE — Telephone Encounter (Signed)
I called to check on him--but he is now in the ER. No evaluation yet. Will await their findings

## 2020-07-21 NOTE — ED Provider Notes (Signed)
[facility] Emergency Department Provider Note   ____________________________________________   Event Date/Time   First MD Initiated Contact with Patient 07/21/20 1332     (approximate)  I have reviewed the triage vital signs and the nursing notes.   HISTORY  Chief Complaint Airway Obstruction    HPI Walter [name] is a 87 y.o. male patient sent from the [facility] clinic for evaluation of dysphagia.  Patient and wife states that his experience difficulty with swallowing and feels like his throat is "closing up".  Patient is able to tolerate liquids and pured food.  Patient also states mucus gets stuck in his throat.  Patient has a history of Parkinson disease.  Wife is validating patient's complaint.         Past Medical History:  Diagnosis Date  . Abdominal aortic aneurysm (AAA)   . Actinic keratosis 07/31/2013  . ARF (acute renal failure) 2011   after TKR  . Benign prostatic hyperplasia   . Bladder cancer 03/26/2019  . Bradycardia 09/26/2019  . C. difficile diarrhea   . CKD (chronic kidney disease) 03/05/2016   stage 3, GFR 30-59 ml/min  . DVT (deep venous thrombosis) 1998   after left knee arthroscopy  . Dysphagia 10/02/2018  . History of colonoscopy 2000  . Hypertension   . Hypothyroidism 2007   postop from thyroidectomy (cancer scare)   . Kidney stones    recurrent  . Major neurocognitive disorder due to Parkinson's disease without behavioral disturbance 10/30/2019  . Oral cancer 2004   graft from left thigh  . Osteoarthritis of both knees   . Parkinson's disease   . Pulmonary embolism   . RBD (REM behavioral disorder) 10/24/2017  . 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 Tendonitis 2012   from quinolone  . Toe amputation status 2010  . Vitamin B12 deficiency 09/09/2014    Patient Active Problem List   Diagnosis Date Noted  . Facial mass 01/30/2020  . Change in mental state 12/16/2019  . Major  neurocognitive disorder due to Parkinson's disease without behavioral disturbance 10/30/2019  . Inflammation of interphalangeal joint of right toe due to infection 09/26/2019  . Corn infected 09/26/2019  . Bradycardia 09/26/2019  . Hammer toe of right foot 09/26/2019  . Fatigue 09/04/2019  . Bladder cancer 03/26/2019  . Malnutrition of mild degree 12/21/2018  . Pressure injury of skin 11/11/2018  . Dysphagia 10/02/2018  . Abdominal aortic aneurysm (AAA)   . RBD (REM behavioral disorder) 10/24/2017  . Renal cyst, right 06/06/2016  . CKD (chronic kidney disease) stage 3, GFR 30-59 ml/min (HCC) 03/05/2016  . History of pulmonary embolus (PE) 03/03/2016  . Vitamin B12 deficiency 09/09/2014  . Parkinson's disease ([place]) 08/04/2014  . Esophageal diverticulum 01/01/2014  . Actinic keratosis 07/31/2013  . Hypertension   . Osteoarthritis of both knees   . Toe amputation status   . Benign prostatic hyperplasia   . Kidney stones   . Hypothyroidism   . Tendonitis     Past Surgical History:  Procedure Laterality Date  . APPENDECTOMY    . CHOLECYSTECTOMY    . CYSTOSCOPY W/ RETROGRADES Bilateral 03/18/2019   Procedure: CYSTOSCOPY WITH RETROGRADE PYELOGRAM;  Surgeon: Hollice Espy, MD;  Location: [facility] ORS;  Service: Urology;  Laterality: Bilateral;  . CYSTOSCOPY/URETEROSCOPY/HOLMIUM LASER/STENT PLACEMENT Right 09/13/2017   Procedure: CYSTOSCOPY/URETEROSCOPY/HOLMIUM LASER/STENT PLACEMENT;  Surgeon: Hollice Espy, MD;  Location: [facility] ORS;  Service: Urology;  Laterality: Right;  . HERNIA REPAIR    .  INTRAMEDULLARY (IM) NAIL INTERTROCHANTERIC Right 06/13/2017   Procedure: INTRAMEDULLARY (IM) NAIL INTERTROCHANTRIC;  Surgeon: Hessie Knows, MD;  Location: [facility] ORS;  Service: Orthopedics;  Laterality: Right;  . LITHOTRIPSY  2009/2010   then stent and cystoscopic removal 2014  . Oral cancer removed Right 2004  . REPAIR ZENKER'S DIVERTICULA  2013  . THYROIDECTOMY  2007  . TOE AMPUTATION Right 2010    badly overlapping other toe  . TOTAL KNEE ARTHROPLASTY Left   . TRANSURETHRAL RESECTION OF BLADDER TUMOR WITH MITOMYCIN-C N/A 03/18/2019   Procedure: TRANSURETHRAL RESECTION OF BLADDER TUMOR WITH Gemcitabine;  Surgeon: Hollice Espy, MD;  Location: [facility] ORS;  Service: Urology;  Laterality: N/A;  . TRANSURETHRAL RESECTION OF PROSTATE  2011  . UMBILICAL HERNIA REPAIR  2007    Prior to Admission medications   Medication Sig Start Date End Date Taking? Authorizing Provider  acetaminophen (TYLENOL) 325 MG tablet Take 2 tablets (650 mg total) by mouth every 6 (six) hours as needed for mild pain or moderate pain (or Fever >/= 101). 06/16/17   Dustin Flock, MD  carbidopa-levodopa (PARCOPA) 25-100 MG disintegrating tablet 2 TABLETS AT 8 AM, 1 TABLET AT 11 AM, 2 TABLETS AT 2 PM, 1 TABLET AT 6 PM 03/12/20   Tat, Rebecca S, DO  DENTA 5000 PLUS 1.1 % CREA dental cream Place 1 application onto teeth daily. 02/26/19   [provider]  ELIQUIS 2.5 MG TABS tablet TAKE 1 TABLET BY MOUTH TWICE A DAY 04/03/20   Venia Carbon, MD  levothyroxine (SYNTHROID) 112 MCG tablet Take 1 tablet (112 mcg total) by mouth daily before breakfast. 03/26/19   Venia Carbon, MD  silver sulfADIAZINE (SILVADENE) 1 % cream Apply 1 application topically daily. 03/26/20   Venia Carbon, MD    Allergies Quinolones  Family History  Problem Relation Age of Onset  . Dementia Mother   . Stroke Father   . Pulmonary disease Brother   . Heart disease Neg Hx   . Diabetes Neg Hx     Social History Social History   Tobacco Use  . Smoking status: Former Smoker    Types: Cigars    Quit date: 04/27/2002    Years since quitting: 18.2  . Smokeless tobacco: Never Used  Vaping Use  . Vaping Use: Never used  Substance Use Topics  . Alcohol use: Not Currently    Alcohol/week: 0.0 standard drinks  . Drug use: No    Review of Systems Constitutional: No fever/chills Eyes: No visual changes. ENT:  Dysphagia. Cardiovascular: Denies chest pain. Respiratory: Denies shortness of breath. Gastrointestinal: No abdominal pain.  No nausea, no vomiting.  No diarrhea.  No constipation. Genitourinary: Negative for dysuria. Musculoskeletal: Negative for back pain. Skin: Negative for rash. Neurological: Negative for headaches, focal weakness or numbness. Psychiatric:  Parkinson disease Endocrine:  Hypertension and hypothyroidism Allergic/Immunilogical: Quinolones ____________________________________________   PHYSICAL EXAM:  VITAL SIGNS: ED Triage Vitals  Enc Vitals Group     BP 07/21/20 1221 138/88     Pulse Rate 07/21/20 1221 70     Resp 07/21/20 1221 16     Temp 07/21/20 1221 97.6 F (36.4 C)     Temp Source 07/21/20 1221 Oral     SpO2 07/21/20 1221 97 %     Weight 07/21/20 1234 167 lb 15.9 oz (76.2 kg)     Height 07/21/20 1234 5\' 10"  (1.778 m)     Head Circumference --      Peak Flow --  Pain Score 07/21/20 1222 8     Pain Loc --      Pain Edu? --      Excl. in [place]? --     Constitutional: Alert and oriented. Well appearing and in no acute distress. Nose: No congestion/rhinnorhea. Mouth/Throat: Mucous membranes are moist.  Oropharynx non-erythematous. Neck: No stridor.  Hematological/Lymphatic/Immunilogical: No cervical lymphadenopathy. Cardiovascular: Normal rate, regular rhythm. Grossly normal heart sounds.  Good peripheral circulation. Respiratory: Normal respiratory effort.  No retractions. Lungs CTAB. Gastrointestinal: Soft and nontender. No distention. No abdominal bruits. No CVA tenderness. Genitourinary: Deferred Musculoskeletal: No lower extremity tenderness nor edema.  No joint effusions. Neurologic:  Normal speech and language. No gross focal neurologic deficits are appreciated. No gait instability. Skin:  Skin is warm, dry and intact. No rash noted. Psychiatric: Mood and affect are normal. Speech and behavior are  normal.  ____________________________________________   LABS (all labs ordered are listed, but only abnormal results are displayed)  Labs Reviewed - No data to display ____________________________________________  EKG   ____________________________________________  RADIOLOGY I, Sable Feil, personally viewed and evaluated these images (plain radiographs) as part of my medical decision making, as well as reviewing the written report by the radiologist.  ED MD interpretation:    Official radiology report(s): DG Neck Soft Tissue  Result Date: 07/21/2020 CLINICAL DATA:  Difficulty swallowing. EXAM: NECK SOFT TISSUES - 1+ VIEW COMPARISON:  None. FINDINGS: There is no evidence of retropharyngeal soft tissue swelling or epiglottic enlargement. The cervical airway is unremarkable and no radio-opaque foreign body identified. Cervical spondylosis and scoliosis noted. IMPRESSION: No acute finding. Electronically Signed   By: Inge Rise M.D.   On: 07/21/2020 14:43    ____________________________________________   PROCEDURES  Procedure(s) performed (including Critical Care):  Procedures   ____________________________________________   INITIAL IMPRESSION / ASSESSMENT AND PLAN / ED COURSE  As part of my medical decision making, I reviewed the following data within the [facility]         Patient presents with dysphagia which has been increasing in the past couple months.  Patient was sent from the urgent care clinic suspecting obstruction.  Discussed no acute findings on soft tissue neck x-ray with patient and wife.  Patient complaining physical exam is consistent with dysphagia which may be a complication of Parkinson's disease.  Consult given to ENT for further evaluation.      ____________________________________________   FINAL CLINICAL IMPRESSION(S) / ED DIAGNOSES  Final diagnoses:  Oropharyngeal dysphagia     ED Discharge Orders    None       *Please note:  Maverik Foot was evaluated in Emergency Department on 07/21/2020 for the symptoms described in the history of present illness. He was evaluated in the context of the global COVID-19 pandemic, which necessitated consideration that the patient might be at risk for infection with the SARS-CoV-2 virus that causes COVID-19. Institutional protocols and algorithms that pertain to the evaluation of patients at risk for COVID-19 are in a state of rapid change based on information released by regulatory bodies including the CDC and federal and state organizations. These policies and algorithms were followed during the patient's care in the ED.  Some ED evaluations and interventions may be delayed as a result of limited staffing during and the pandemic.*   Note:  This document was prepared using Dragon voice recognition software and may include unintentional dictation errors.    Sable Feil, PA-C 07/21/20 1502    Carrie Mew,  MD 07/21/20 1516

## 2020-07-21 NOTE — ED Triage Notes (Signed)
Pt comes with c/o feeling like his throat is closing or something is stuck in it. Pt denies any SOB or CP.  Pt states trouble swallowing. Pt states mucus is stuck . Pt speaking in clear sentences. Pt appears in NAD at this time.

## 2020-07-21 NOTE — Telephone Encounter (Signed)
[facility] - Client TELEPHONE ADVICE RECORD AccessNurse Patient Name: Walter [name] Gender: Male DOB: March 31, 1931 Age: 87 Y 70 M 23 D Return Phone Number: [phone] (Primary), [phone] (Secondary) Address: City/ State/ Zip: [place] [address] Client [facility] - Client Client Site [place] - Day Physician Viviana Simpler- MD Contact Type Call Who Is Calling Patient / Member / Family / Caregiver Call Type Triage / Clinical Caller Name Mrs. Lajoyce Corners Relationship To Patient Spouse Return Phone Number [phone] (Primary) Chief Complaint Swallowing Difficulty Reason for Call Symptomatic / Request for Health Information Initial Comment Caller states, pt has Parkinson's disease. Pt has diarrhea and issues swallowing. Translation No Nurse Assessment Nurse: Rolin Barry, RN, Levada Dy Date/Time Eilene Ghazi Time): 07/21/2020 9:46:51 AM Confirm and document reason for call. If symptomatic, describe symptoms. ---Caller states, pt has Parkinson's disease. Pt has diarrhea and issues swallowing. Advised that he has a lot of mucous. No temp, 98.7. Difference noticed in swallowing on Sunday. Having diarrhea as well, only once a day. Does the patient have any new or worsening symptoms? ---Yes Will a triage be completed? ---Yes Related visit to physician within the last 2 weeks? ---No Does the PT have any chronic conditions? (i.e. diabetes, asthma, this includes High risk factors for pregnancy, etc.) ---Yes List chronic conditions. ---Parkinson's Is this a behavioral health or substance abuse call? ---No Guidelines Guideline Title Affirmed Question Affirmed Notes Nurse Date/Time (Eastern Time) Swallowing Difficulty SEVERE difficulty swallowing (e.g., drooling or spitting, can't swallow water) Deaton, RN, Levada Dy 07/21/2020 9:48:45 AM Disp. Time Eilene Ghazi Time) Disposition Final User PLEASE NOTE: All  timestamps contained within this report are represented as [place] Standard Time. CONFIDENTIALTY NOTICE: This fax transmission is intended only for the addressee. It contains information that is legally privileged, confidential or otherwise protected from use or disclosure. If you are not the intended recipient, you are strictly prohibited from reviewing, disclosing, copying using or disseminating any of this information or taking any action in reliance on or regarding this information. If you have received this fax in error, please notify us immediately by telephone so that we can arrange for its return to [place]. Phone: [phone], Toll-Free: [phone], Fax: [phone] Page: 2 of 2 Call Id: [id number] 07/21/2020 9:52:26 AM Go to ED Now Yes Deaton, RN, Cindee Lame Disagree/Comply Disagree Caller Understands Yes PreDisposition Did not know what to do Care Advice Given Per Guideline GO TO ED NOW: * You need to be seen in the Emergency Department. * Go to the ED at ___________ [facility] now. Drive carefully. NOTHING BY MOUTH: * Do not eat or drink anything for now. * Reason: Condition may require sedation, anesthesia, and endoscopy. CARE ADVICE given per Swallowed Difficulty (Adult) guideline. Comments User: Saverio Danker, RN Date/Time Eilene Ghazi Time): 07/21/2020 9:53:41 AM Caller advised that she has had a heart attack and is recovering, advised that she can not take him. Advised that she can call 911. Caller was unsure what she was going to do. Backline made aware. User: Saverio Danker, RN Date/Time Eilene Ghazi Time): 07/21/2020 9:57:22 AM Caller advised that he is not able to eat related to trouble swallowing. Advised that he has a lot of mucus. User: Saverio Danker, RN Date/Time Eilene Ghazi Time): 07/21/2020 10:00:54 AM Larene Beach advised that she will call the caller. Referrals GO TO FACILITY UNDECIDED

## 2020-07-21 NOTE — Telephone Encounter (Signed)
Access nurse, Walter [name], contacted office reporting pt's wife, Walter [name], reports pt having difficulty swallowing for 3 days, she believes due to mucous. Walter [name] reports the pt said it hurts to swallow. Pt has Parkinson's and is currently on a pureed diet. Pt is not having any choking or coughing when eating. Diarrhea 1xday for 3 days also. Disposition was go to the ER. Walter [name] said she could not take him to the ER due to her recovering from a HA. Nurse tried to advise she could call EMS but Walter [name] refused.   Contacted Walter [name] who reports the same as above but also reports the pt always has mucous it has just been worse over the last 3 days. She thinks because his "bowels are messed up, it messes up everything else." Advised pt go to ER and that he could be dehydrated with limited intake and diarrhea. She reports he is eating limited amounts just not as much as normal. She does not want to call EMS and reports she will try to bring him herself later today even though she reports it will be difficult for her to do. Advised this nurse could call EMS for her. She refused. [facility] could assess and call EMS to take him to hospital. She said she is going to think about taking him and she will most likely try later to take him. Advised to contact office if any further assistance was needed. Walter [name] verbalized understanding.

## 2020-07-21 NOTE — ED Triage Notes (Signed)
First Nurse Note:  Arrives from [facility] for ED evaluation for C/O feeling like throat is closing since Sunday.  Patient arrives.  AAOx3.  Speech clear and strong.  C/O sore throat when swallowing.

## 2020-07-21 NOTE — Discharge Instructions (Addendum)
No acute findings on soft tissue neck x-ray.  Read and follow discharge care instruction.  A consult to ENT was generated.  Call the number listed on your discharge care instructions in the morning and tell them you a follow-up from the emergency room.  Continue previous medications.

## 2020-07-21 NOTE — ED Notes (Addendum)
See triage note  Brought over from [facility]  States feels like throat is closing or that something is stuck  Sx's started a few days ago  No drooling noted  Wife states he has a lot of drooling and then he starts to cough   So wife states he is not able to eat d/t this   Hx of Parkinson"s

## 2020-07-23 ENCOUNTER — Encounter: Payer: Self-pay | Admitting: Internal Medicine

## 2020-07-23 ENCOUNTER — Ambulatory Visit (INDEPENDENT_AMBULATORY_CARE_PROVIDER_SITE_OTHER): Payer: PPO | Admitting: Internal Medicine

## 2020-07-23 ENCOUNTER — Other Ambulatory Visit: Payer: Self-pay

## 2020-07-23 ENCOUNTER — Telehealth: Payer: Self-pay | Admitting: Neurology

## 2020-07-23 DIAGNOSIS — R1312 Dysphagia, oropharyngeal phase: Secondary | ICD-10-CM | POA: Diagnosis not present

## 2020-07-23 NOTE — Progress Notes (Signed)
Subjective:    Patient ID: Walter [name], male    DOB: 1931/03/30, 87 y.o.   MRN: [id number]  HPI Video virtual visit for ER follow up Identification done Reviewed limitations and billing and he gave consent Participants --patient and wife in their home (wife provides much of the history) and I am in my office  Long standing trouble swallowing Had been "building up" over time  Had been in [facility] for prolonged respite while wife in the hospital/rehab (16 days)  More trouble with mucus --can't swallow easily (and when he does, it upsets his stomach) Now with some loose stools --and they have mucus Not eating much for 4 days Only one loose stool daily  No fever  Has been coughing--from the mucus No appetite ---eating about 1/2 portion of the Mom's meals pureed meals Now having boost as well--but that is not new (1 per day is not new)  No abd pain No blood in stool  ER evaluation reviewed No findings He was exhausted after that He does have appt with Dr Carles Collet in July  He doesn't notice any improvement in swallowing after the carbidopa/levodopa  Current Outpatient Medications on File Prior to Visit  Medication Sig Dispense Refill  . acetaminophen (TYLENOL) 325 MG tablet Take 2 tablets (650 mg total) by mouth every 6 (six) hours as needed for mild pain or moderate pain (or Fever >/= 101).    . carbidopa-levodopa (PARCOPA) 25-100 MG disintegrating tablet 2 TABLETS AT 8 AM, 1 TABLET AT 11 AM, 2 TABLETS AT 2 PM, 1 TABLET AT 6 PM 540 tablet 1  . DENTA 5000 PLUS 1.1 % CREA dental cream Place 1 application onto teeth daily.    Marland Kitchen ELIQUIS 2.5 MG TABS tablet TAKE 1 TABLET BY MOUTH TWICE A DAY 180 tablet 3  . levothyroxine (SYNTHROID) 112 MCG tablet Take 1 tablet (112 mcg total) by mouth daily before breakfast. 90 tablet 3  . silver sulfADIAZINE (SILVADENE) 1 % cream Apply 1 application topically daily. 50 g 1   No current facility-administered medications on file prior to visit.     Allergies  Allergen Reactions  . Quinolones Other (See Comments)    tendonitis    Past Medical History:  Diagnosis Date  . Abdominal aortic aneurysm (AAA)   . Actinic keratosis 07/31/2013  . ARF (acute renal failure) 2011   after TKR  . Benign prostatic hyperplasia   . Bladder cancer 03/26/2019  . Bradycardia 09/26/2019  . C. difficile diarrhea   . CKD (chronic kidney disease) 03/05/2016   stage 3, GFR 30-59 ml/min  . DVT (deep venous thrombosis) 1998   after left knee arthroscopy  . Dysphagia 10/02/2018  . History of colonoscopy 2000  . Hypertension   . Hypothyroidism 2007   postop from thyroidectomy (cancer scare)   . Kidney stones    recurrent  . Major neurocognitive disorder due to Parkinson's disease without behavioral disturbance 10/30/2019  . Oral cancer 2004   graft from left thigh  . Osteoarthritis of both knees   . Parkinson's disease   . Pulmonary embolism   . RBD (REM behavioral disorder) 10/24/2017  . 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 Tendonitis 2012   from quinolone  . Toe amputation status 2010  . Vitamin B12 deficiency 09/09/2014    Past Surgical History:  Procedure Laterality Date  . APPENDECTOMY    . CHOLECYSTECTOMY    . CYSTOSCOPY W/ RETROGRADES Bilateral 03/18/2019  Procedure: CYSTOSCOPY WITH RETROGRADE PYELOGRAM;  Surgeon: Hollice Espy, MD;  Location: [facility] ORS;  Service: Urology;  Laterality: Bilateral;  . CYSTOSCOPY/URETEROSCOPY/HOLMIUM LASER/STENT PLACEMENT Right 09/13/2017   Procedure: CYSTOSCOPY/URETEROSCOPY/HOLMIUM LASER/STENT PLACEMENT;  Surgeon: Hollice Espy, MD;  Location: [facility] ORS;  Service: Urology;  Laterality: Right;  . HERNIA REPAIR    . INTRAMEDULLARY (IM) NAIL INTERTROCHANTERIC Right 06/13/2017   Procedure: INTRAMEDULLARY (IM) NAIL INTERTROCHANTRIC;  Surgeon: Hessie Knows, MD;  Location: [facility] ORS;  Service: Orthopedics;  Laterality: Right;  . LITHOTRIPSY  2009/2010   then stent and  cystoscopic removal 2014  . Oral cancer removed Right 2004  . REPAIR ZENKER'S DIVERTICULA  2013  . THYROIDECTOMY  2007  . TOE AMPUTATION Right 2010   badly overlapping other toe  . TOTAL KNEE ARTHROPLASTY Left   . TRANSURETHRAL RESECTION OF BLADDER TUMOR WITH MITOMYCIN-C N/A 03/18/2019   Procedure: TRANSURETHRAL RESECTION OF BLADDER TUMOR WITH Gemcitabine;  Surgeon: Hollice Espy, MD;  Location: [facility] ORS;  Service: Urology;  Laterality: N/A;  . TRANSURETHRAL RESECTION OF PROSTATE  2011  . UMBILICAL HERNIA REPAIR  2007    Family History  Problem Relation Age of Onset  . Dementia Mother   . Stroke Father   . Pulmonary disease Brother   . Heart disease Neg Hx   . Diabetes Neg Hx     Social History   Socioeconomic History  . Marital status: Married    Spouse name: Not on file  . Number of children: 3  . Years of education: 16  . Highest education level: Bachelor's degree (e.g., BA, AB, BS)  Occupational History  . 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 Smoking status: Former Smoker    Types: Cigars    Quit date: 04/27/2002    Years since quitting: 18.2  . Smokeless tobacco: Never Used  Vaping Use  . Vaping Use: Never used  Substance and Sexual Activity  . Alcohol use: Not Currently    Alcohol/week: 0.0 standard drinks  . Drug use: No  . Sexual activity: Not Currently  Other Topics Concern  . Not on file  Social History Narrative   Son works for [employer]   1 daughter in [place], other in [place]      Has living will   Wife, then son, would be health care POA   Has DNR   No tube feeds if cognitively unaware   Has MOST form done 12/21/18   Social Determinants of Health   Financial Resource Strain: Not on file  Food Insecurity: Not on file  Transportation Needs: Not on file  Physical Activity: Not on file  Stress: Not on file  Social Connections: Not on file  Intimate Partner Violence:  Not on file   Review of Systems No trouble voiding Sleeps a lot in day--does okay at night (nocturia x 1-2)    Objective:   Physical Exam Constitutional:      Appearance: Normal appearance.  Neurological:     Mental Status: He is alert.     Comments: Usual masked facies and fairly severe bradykinesia He does speak fairly well but sparsely            Assessment & Plan:

## 2020-07-23 NOTE — Assessment & Plan Note (Signed)
While initial reports suggested obstructing saliva---I think that this is still related to the Parkinson's, and his failure to swallow secretions well is a secondary thing. They both agree with this after discussing. He is clearly having more problem but still able to eat the pureed food (less) and drink the ensure. Only one loose stool daily---I let them know this was not a significant problem (at least not constipated).  I will send this note to Dr Tat for suggestions. I would be reluctant to give any anticholinergent for the secretions as he is already mildly confused and delirium almost certainly would result. I am not sure if a change in his Parkinson's regimen might help---he doesn't seem to have any "on" or "off" with his meds

## 2020-07-23 NOTE — Telephone Encounter (Signed)
-----   Message from Venia Carbon, MD sent at 07/23/2020  3:13 PM EDT ----- He was seeing ENT about that---but they don't have anything to offer. (thanks for reminding me about that). They may just appreciate hearing from your staff. The worst of the crisis seems to have passed----2 days ago, it seemed like they felt he couldn't swallow at all. ----- Message ----- From: Ludwig Clarks, DO Sent: 07/23/2020   2:30 PM EDT To: Venia Carbon, MD  Happy to re-eval although last time we did so it was not a Parkinsons Disease issue but a Zenkers diverticulum issue.  Does he see GI? ----- Message ----- From: Venia Carbon, MD Sent: 07/23/2020   2:22 PM EDT To: Eustace Quail Chandler Stofer, DO  Adina Puzzo, His dysphagia seems to have taken another step down. Could you please contract them (or have your staff do this) if you have any suggestions for improving this difficult situation. Rich

## 2020-07-23 NOTE — Telephone Encounter (Signed)
Walter [name], could you get this patient in on cx list (vv is fine) at request of PCP.

## 2020-07-24 NOTE — Telephone Encounter (Signed)
Patient is on the wait list.

## 2020-07-28 IMAGING — DX DG CHEST 1V PORT
1 series · 2 of 2 positions shown · non-contrast
Comparison: 11/10/2018

CLINICAL DATA: Weakness

EXAM:
PORTABLE CHEST 1 VIEW

[Series 1: chest ap · 0.14mm/px · 2 of 2 slices shown]
[im 1/2]
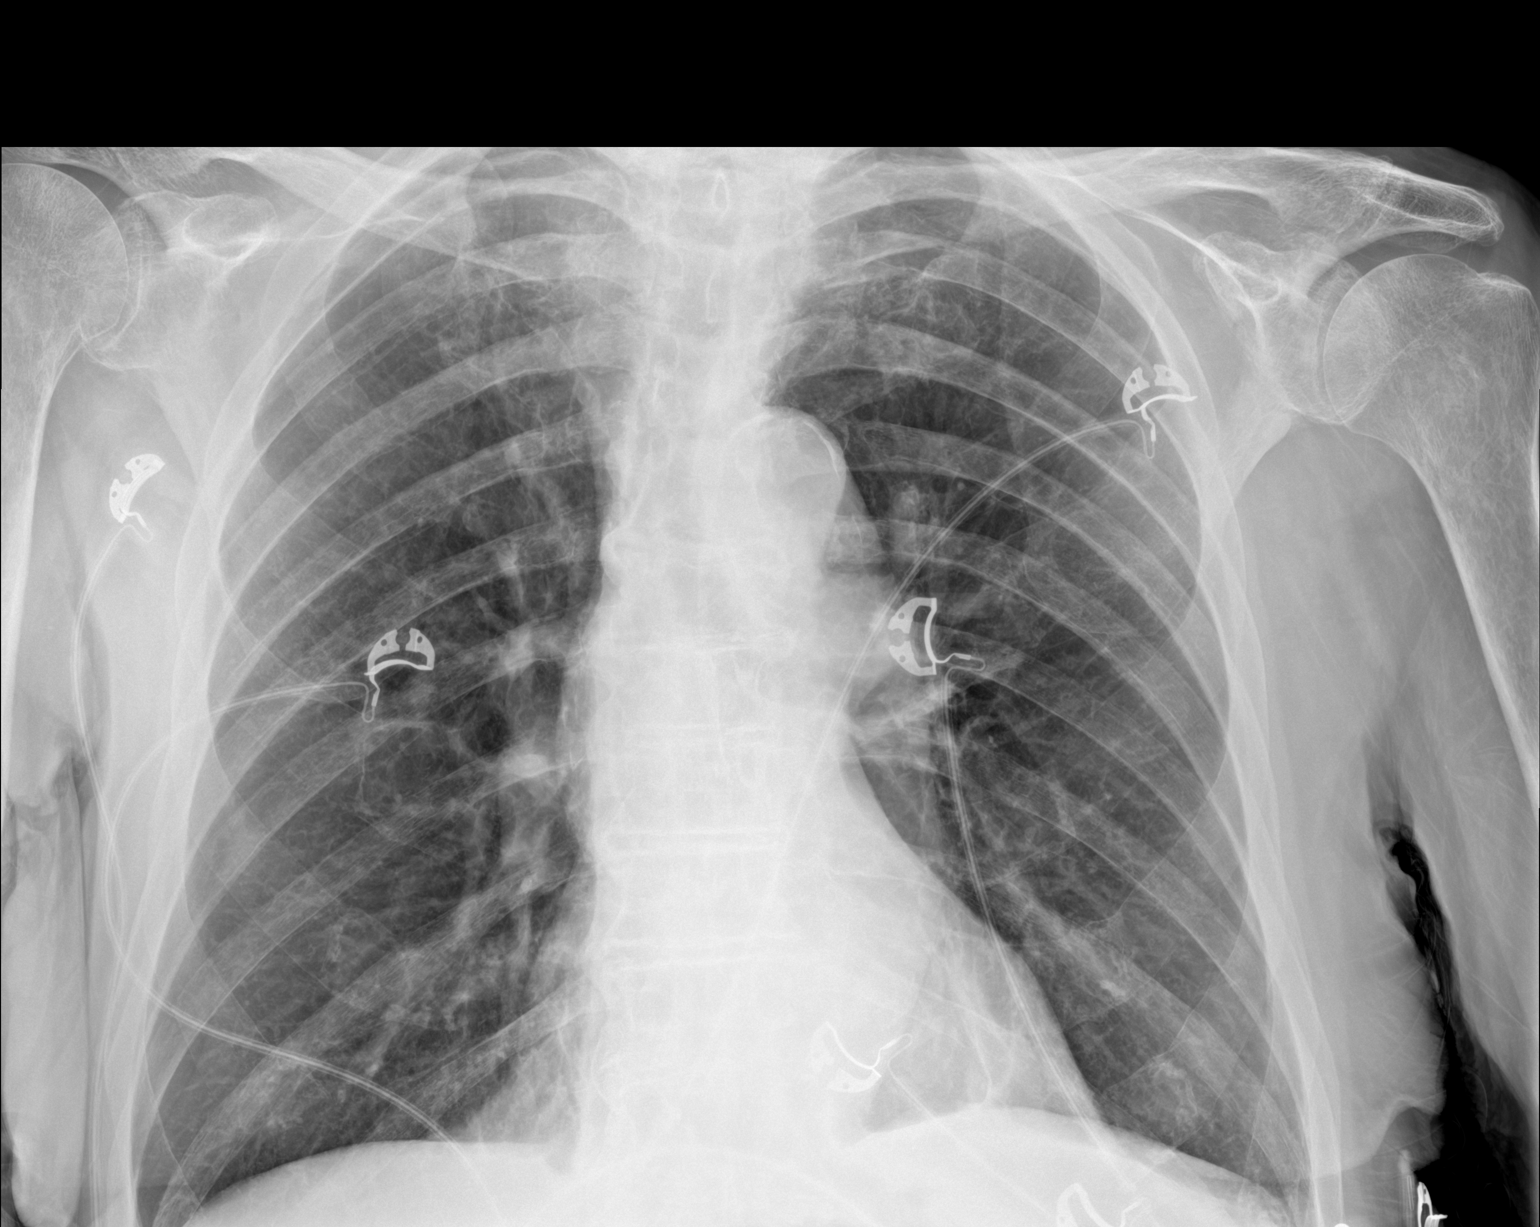
[im 2/2]
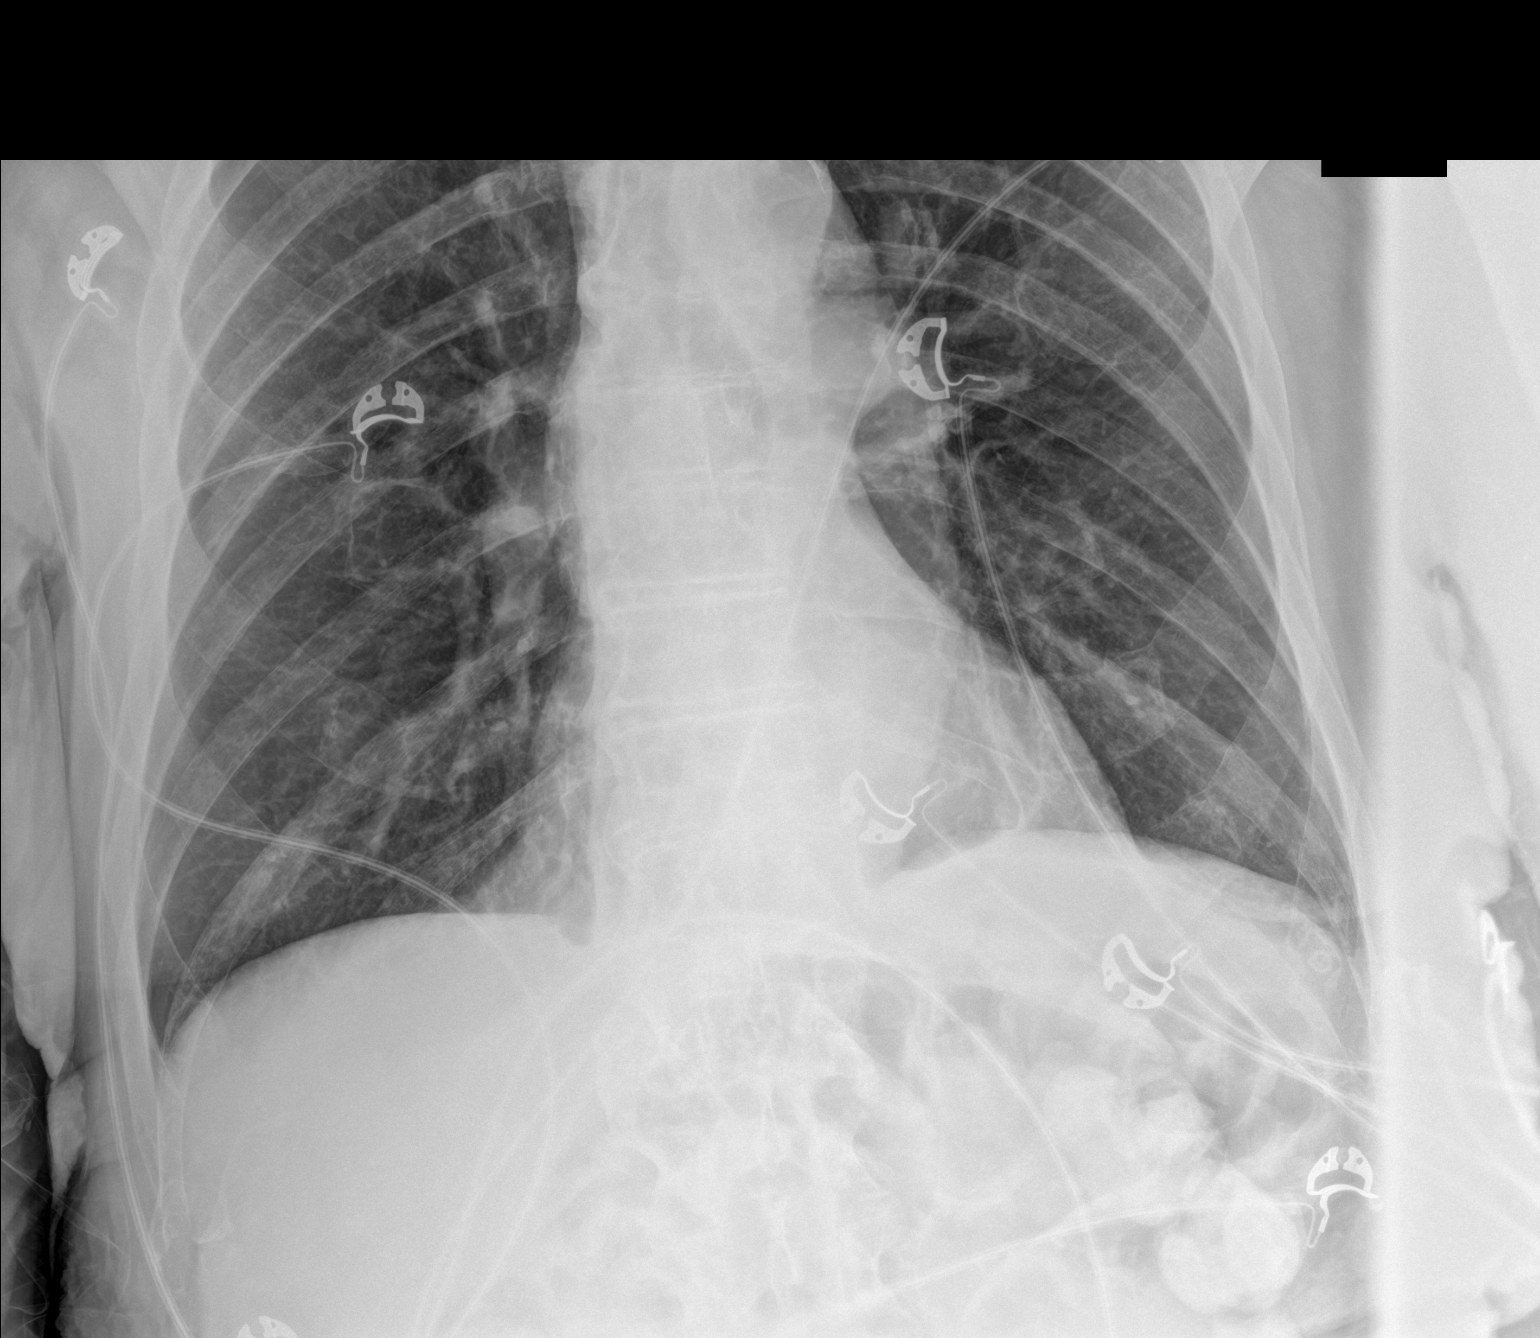

[2 of 2 positions shown; findings below may reference images not displayed]

FINDINGS: Minimal scarring at the left base. No acute consolidation or
effusion. Stable cardiomediastinal silhouette with aortic
atherosclerosis. No pneumothorax.
IMPRESSION: No active disease.  Scarring at the left lung base.

## 2020-07-30 ENCOUNTER — Other Ambulatory Visit: Payer: Self-pay

## 2020-07-30 ENCOUNTER — Encounter: Payer: Self-pay | Admitting: Internal Medicine

## 2020-07-30 ENCOUNTER — Ambulatory Visit (INDEPENDENT_AMBULATORY_CARE_PROVIDER_SITE_OTHER): Payer: PPO | Admitting: Internal Medicine

## 2020-07-30 VITALS — BP 112/70 | HR 83 | Temp 97.9°F | Ht 70.0 in | Wt 154.0 lb

## 2020-07-30 DIAGNOSIS — R1312 Dysphagia, oropharyngeal phase: Secondary | ICD-10-CM

## 2020-07-30 DIAGNOSIS — E441 Mild protein-calorie malnutrition: Secondary | ICD-10-CM

## 2020-07-30 DIAGNOSIS — G20 Parkinson's disease: Secondary | ICD-10-CM

## 2020-07-30 DIAGNOSIS — S98131A Complete traumatic amputation of one right lesser toe, initial encounter: Secondary | ICD-10-CM | POA: Diagnosis not present

## 2020-07-30 DIAGNOSIS — Z86711 Personal history of pulmonary embolism: Secondary | ICD-10-CM

## 2020-07-30 DIAGNOSIS — I714 Abdominal aortic aneurysm, without rupture, unspecified: Secondary | ICD-10-CM

## 2020-07-30 DIAGNOSIS — Z0000 Encounter for general adult medical examination without abnormal findings: Secondary | ICD-10-CM | POA: Diagnosis not present

## 2020-07-30 DIAGNOSIS — I10 Essential (primary) hypertension: Secondary | ICD-10-CM

## 2020-07-30 DIAGNOSIS — I700 Atherosclerosis of aorta: Secondary | ICD-10-CM | POA: Diagnosis not present

## 2020-07-30 MED ORDER — LEVOTHYROXINE SODIUM 112 MCG PO TABS: 112.0000 ug | ORAL_TABLET | Freq: Every day | ORAL | 3 refills | Status: AC

## 2020-07-30 NOTE — Patient Instructions (Signed)
Please set up with the ENT doctor to see if something is new causing the swallowing problems.

## 2020-07-30 NOTE — Assessment & Plan Note (Signed)
BP Readings from Last 3 Encounters:  07/30/20 112/70  07/21/20 138/88  01/30/20 118/72   Lower now without Rx

## 2020-07-30 NOTE — Assessment & Plan Note (Signed)
Small in 2020 No follow up due to overall condition

## 2020-07-30 NOTE — Assessment & Plan Note (Signed)
Worsened and with weight loss Unclear if he has a parotid neoplasm/worsening Zenker's? Asked wife to set back up with ENT

## 2020-07-30 NOTE — Assessment & Plan Note (Signed)
Seen on CT scan No statin due to overall status

## 2020-07-30 NOTE — Assessment & Plan Note (Signed)
Continues on eliquis  

## 2020-07-30 NOTE — Assessment & Plan Note (Signed)
Asked wife to add second boost Cut out OJ

## 2020-07-30 NOTE — Assessment & Plan Note (Signed)
I have personally reviewed the Medicare Annual Wellness questionnaire and have noted 1. The patient's medical and social history 2. Their use of alcohol, tobacco or illicit drugs 3. Their current medications and supplements 4. The patient's functional ability including ADL's, fall risks, home safety risks and hearing or visual             impairment. 5. Diet and physical activities 6. Evidence for depression or mood disorders  The patients weight, height, BMI and visual acuity have been recorded in the chart I have made referrals, counseling and provided education to the patient based review of the above and I have provided the pt with a written personalized care plan for preventive services.  I have provided you with a copy of your personalized plan for preventive services. Please take the time to review along with your updated medication list.  No cancer screening due to age Had second COVID booster---needs flu vaccine in the fall Can't exercise

## 2020-07-30 NOTE — Progress Notes (Signed)
Hearing Screening   125Hz  250Hz  500Hz  1000Hz  2000Hz  3000Hz  4000Hz  6000Hz  8000Hz   Right ear:           Left ear:           Comments: Has hearing aids. Not wearing them today  Vision Screening Comments: July 2021

## 2020-07-30 NOTE — Assessment & Plan Note (Signed)
Needs help with ADLs Unclear if dysphagia is from this On sinemet

## 2020-07-30 NOTE — Progress Notes (Signed)
Subjective:    Patient ID: Walter [name], male    DOB: 1932-02-11, 87 y.o.   MRN: [id number]  HPI Here with wife for Medicare wellness visit and follow up of chronic health conditions This visit occurred during the SARS-CoV-2 public health emergency.  Safety protocols were in place, including screening questions prior to the visit, additional usage of staff PPE, and extensive cleaning of exam room while observing appropriate contact time as indicated for disinfecting solutions.   Reviewed advanced directives Reviewed other doctors--Dr Maklouf--dentist, Dr Lenore Manner ([place]ertz--neuropsychologist), Ms Olena Heckle NP (Palliative care) No alcohol or tobacco Not able to exercise Needs cataract surgery---has been postponed due to other issues Hearing is okay No falls Not really depressed and not anhedonic No hospitalizations or hospitalizations this year Doesn't drive. Wife and daily aide assist with ADLs Some problems with memory  Still having trouble with the dysphagia Has lost considerable weight Trouble even with the pureed "Mom's meals" Taking boost-- 1 per day Still with loose stools---1-2 per day, but "like chocolate syrup". Needs attends for some incontinence Discussed the Zenker's diverticulum---hasn't seen ENT in some time (could be adding to or causing the worsening) Unclear if this is from the Parkinson's or not  Reviewed labs ---last GFR 44  No chest pain No SOB No dizziness or syncope No edema  Reviewed abd CT from 2020 Early AAA and aortic atherosclerosis  Current Outpatient Medications on File Prior to Visit  Medication Sig Dispense Refill  . acetaminophen (TYLENOL) 325 MG tablet Take 2 tablets (650 mg total) by mouth every 6 (six) hours as needed for mild pain or moderate pain (or Fever >/= 101).    . carbidopa-levodopa (PARCOPA) 25-100 MG disintegrating tablet 2 TABLETS AT 8 AM, 1 TABLET AT 11 AM, 2 TABLETS AT 2 PM, 1 TABLET AT 6 PM 540 tablet 1  . ELIQUIS 2.5 MG TABS  tablet TAKE 1 TABLET BY MOUTH TWICE A DAY 180 tablet 3  . levothyroxine (SYNTHROID) 112 MCG tablet Take 1 tablet (112 mcg total) by mouth daily before breakfast. 90 tablet 3  . silver sulfADIAZINE (SILVADENE) 1 % cream Apply 1 application topically daily. 50 g 1   No current facility-administered medications on file prior to visit.    Allergies  Allergen Reactions  . Quinolones Other (See Comments)    tendonitis    Past Medical History:  Diagnosis Date  . Abdominal aortic aneurysm (AAA)   . Actinic keratosis 07/31/2013  . ARF (acute renal failure) 2011   after TKR  . Benign prostatic hyperplasia   . Bladder cancer 03/26/2019  . Bradycardia 09/26/2019  . C. difficile diarrhea   . CKD (chronic kidney disease) 03/05/2016   stage 3, GFR 30-59 ml/min  . DVT (deep venous thrombosis) 1998   after left knee arthroscopy  . Dysphagia 10/02/2018  . History of colonoscopy 2000  . Hypertension   . Hypothyroidism 2007   postop from thyroidectomy (cancer scare)   . Kidney stones    recurrent  . Major neurocognitive disorder due to Parkinson's disease without behavioral disturbance 10/30/2019  . Oral cancer 2004   graft from left thigh  . Osteoarthritis of both knees   . Parkinson's disease   . Pulmonary embolism   . RBD (REM behavioral disorder) 10/24/2017  . 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 Tendonitis 2012   from quinolone  . Toe amputation status 2010  . Vitamin B12 deficiency 09/09/2014    Past Surgical History:  Procedure Laterality Date  . APPENDECTOMY    . CHOLECYSTECTOMY    . CYSTOSCOPY W/ RETROGRADES Bilateral 03/18/2019   Procedure: CYSTOSCOPY WITH RETROGRADE PYELOGRAM;  Surgeon: Hollice Espy, MD;  Location: [facility];  Service: Urology;  Laterality: Bilateral;  . CYSTOSCOPY/URETEROSCOPY/HOLMIUM LASER/STENT PLACEMENT Right 09/13/2017   Procedure: CYSTOSCOPY/URETEROSCOPY/HOLMIUM LASER/STENT PLACEMENT;  Surgeon: Hollice Espy, MD;   Location: [facility];  Service: Urology;  Laterality: Right;  . HERNIA REPAIR    . INTRAMEDULLARY (IM) NAIL INTERTROCHANTERIC Right 06/13/2017   Procedure: INTRAMEDULLARY (IM) NAIL INTERTROCHANTRIC;  Surgeon: Hessie Knows, MD;  Location: [facility];  Service: Orthopedics;  Laterality: Right;  . LITHOTRIPSY  2009/2010   then stent and cystoscopic removal 2014  . Oral cancer removed Right 2004  . REPAIR ZENKER'S DIVERTICULA  2013  . THYROIDECTOMY  2007  . TOE AMPUTATION Right 2010   badly overlapping other toe  . TOTAL KNEE ARTHROPLASTY Left   . TRANSURETHRAL RESECTION OF BLADDER TUMOR WITH MITOMYCIN-C N/A 03/18/2019   Procedure: TRANSURETHRAL RESECTION OF BLADDER TUMOR WITH Gemcitabine;  Surgeon: Hollice Espy, MD;  Location: [facility];  Service: Urology;  Laterality: N/A;  . TRANSURETHRAL RESECTION OF PROSTATE  2011  . UMBILICAL HERNIA REPAIR  2007    Family History  Problem Relation Age of Onset  . Dementia Mother   . Stroke Father   . Pulmonary disease Brother   . Heart disease Neg Hx   . Diabetes Neg Hx     Social History   Socioeconomic History  . Marital status: Married    Spouse name: Not on file  . Number of children: 3  . Years of education: 16  . Highest education level: Bachelor's degree (e.g., BA, AB, BS)  Occupational History  . 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 Smoking status: Former Smoker    Types: Cigars    Quit date: 04/27/2002    Years since quitting: 18.2  . Smokeless tobacco: Never Used  Vaping Use  . Vaping Use: Never used  Substance and Sexual Activity  . Alcohol use: Not Currently    Alcohol/week: 0.0 standard drinks  . Drug use: No  . Sexual activity: Not Currently  Other Topics Concern  . Not on file  Social History Narrative   Son works for [employer]   1 daughter in [place], other in [place]      Has living will   Wife, then son, would be health care POA   Has  DNR   No tube feeds if cognitively unaware   Has MOST form done 12/21/18   Social Determinants of Health   Financial Resource Strain: Not on file  Food Insecurity: Not on file  Transportation Needs: Not on file  Physical Activity: Not on file  Stress: Not on file  Social Connections: Not on file  Intimate Partner Violence: Not on file   Review of Systems Sleeping a lot. Awakened at times due to mucus in throat (and can't swallow) Wears seat belt Teeth okay--keeps up with dentist No suspicious skin lesions Right facial mass is unchanged---near parotid (never got seen by ENT) Bowels loose 1-2 times per week Voids okay No sig back or joint pains    Objective:   Physical Exam Constitutional:      Appearance: Normal appearance.  HENT:     Mouth/Throat:     Comments: No lesions Eyes:     Conjunctiva/sclera: Conjunctivae normal.     Pupils: Pupils  are equal, round, and reactive to light.  Neck:     Comments: Hard right parotid gland Cardiovascular:     Rate and Rhythm: Normal rate and regular rhythm.     Heart sounds: No murmur heard. No gallop.      Comments: Faint pedal pulses Pulmonary:     Effort: Pulmonary effort is normal.     Breath sounds: Normal breath sounds. No wheezing or rales.  Abdominal:     Palpations: Abdomen is soft.     Tenderness: There is no abdominal tenderness.  Musculoskeletal:     Right lower leg: No edema.     Left lower leg: No edema.     Comments: Right 2nd toe amputation site is clean and dry  Lymphadenopathy:     Cervical: No cervical adenopathy.  Skin:    Findings: No rash.     Comments: Stasis changes in calves/feet No ulcers  Neurological:     Mental Status: He is alert and oriented to person, place, and time.     Comments: President---"Biden, Trump, Obama" [phone] D-o-r- Recall 2/3  Moderate bradykinesia  Psychiatric:        Mood and Affect: Mood normal.        Behavior: Behavior normal.             Assessment & Plan:

## 2020-07-30 NOTE — Assessment & Plan Note (Signed)
Site is clean Still with hammertoes

## 2020-07-31 LAB — CBC
HCT: 44.2 % (ref 39.0–52.0)
Hemoglobin: 14.9 g/dL (ref 13.0–17.0)
MCHC: 33.8 g/dL (ref 30.0–36.0)
MCV: 90.2 fl (ref 78.0–100.0)
Platelets: 248 10*3/uL (ref 150.0–400.0)
RBC: 4.91 Mil/uL (ref 4.22–5.81)
RDW: 13.8 % (ref 11.5–15.5)
WBC: 6 10*3/uL (ref 4.0–10.5)

## 2020-07-31 LAB — SEDIMENTATION RATE: Sed Rate: 13 mm/hr (ref 0–20)

## 2020-07-31 LAB — T4, FREE: Free T4: 1.25 ng/dL (ref 0.60–1.60)

## 2020-08-03 LAB — COMPREHENSIVE METABOLIC PANEL
ALT: 6 U/L (ref 0–53)
AST: 13 U/L (ref 0–37)
Albumin: 4.3 g/dL (ref 3.5–5.2)
Alkaline Phosphatase: 89 U/L (ref 39–117)
BUN: 27 mg/dL — ABNORMAL HIGH (ref 6–23)
CO2: 31 mEq/L (ref 19–32)
Calcium: 10.1 mg/dL (ref 8.4–10.5)
Chloride: 103 mEq/L (ref 96–112)
Creatinine, Ser: 1.77 mg/dL — ABNORMAL HIGH (ref 0.40–1.50)
GFR: 34.13 mL/min — ABNORMAL LOW (ref >60.00)
Glucose, Bld: 112 mg/dL — ABNORMAL HIGH (ref 70–99)
Potassium: 4.6 mEq/L (ref 3.5–5.1)
Sodium: 145 mEq/L (ref 135–145)
Total Bilirubin: 1 mg/dL (ref 0.2–1.2)
Total Protein: 7.3 g/dL (ref 6.0–8.3)

## 2020-08-05 ENCOUNTER — Ambulatory Visit: Admit: 2020-08-05 | Payer: PPO | Admitting: Ophthalmology

## 2020-08-05 SURGERY — PHACOEMULSIFICATION, CATARACT, WITH IOL INSERTION
Anesthesia: Topical | Laterality: Left

## 2020-08-11 DIAGNOSIS — D37030 Neoplasm of uncertain behavior of the parotid salivary glands: Secondary | ICD-10-CM | POA: Diagnosis not present

## 2020-08-11 DIAGNOSIS — R1310 Dysphagia, unspecified: Secondary | ICD-10-CM | POA: Diagnosis not present

## 2020-08-11 DIAGNOSIS — H6123 Impacted cerumen, bilateral: Secondary | ICD-10-CM | POA: Diagnosis not present

## 2020-08-12 DIAGNOSIS — G20 Parkinson's disease: Secondary | ICD-10-CM | POA: Diagnosis not present

## 2020-08-12 DIAGNOSIS — M6281 Muscle weakness (generalized): Secondary | ICD-10-CM | POA: Diagnosis not present

## 2020-08-12 DIAGNOSIS — R262 Difficulty in walking, not elsewhere classified: Secondary | ICD-10-CM | POA: Diagnosis not present

## 2020-08-17 ENCOUNTER — Other Ambulatory Visit: Payer: PPO | Admitting: Adult Health Nurse Practitioner

## 2020-08-17 ENCOUNTER — Encounter: Payer: Self-pay | Admitting: Adult Health Nurse Practitioner

## 2020-08-17 ENCOUNTER — Other Ambulatory Visit: Payer: Self-pay

## 2020-08-17 VITALS — BP 128/78 | HR 73 | Wt 147.6 lb

## 2020-08-17 DIAGNOSIS — K225 Diverticulum of esophagus, acquired: Secondary | ICD-10-CM | POA: Diagnosis not present

## 2020-08-17 DIAGNOSIS — R1310 Dysphagia, unspecified: Secondary | ICD-10-CM

## 2020-08-17 DIAGNOSIS — G20 Parkinson's disease: Secondary | ICD-10-CM | POA: Diagnosis not present

## 2020-08-17 DIAGNOSIS — Z515 Encounter for palliative care: Secondary | ICD-10-CM | POA: Diagnosis not present

## 2020-08-17 NOTE — Progress Notes (Signed)
[facility] Consult Note Telephone: [phone]  Fax: [phone]    Date of encounter: 08/17/20 PATIENT NAME: Walter [name] [address] [place] [address]   [phone] (home)  DOB: [birth year] MRN: [id number] PRIMARY CARE PROVIDER:    Venia Carbon, MD,  [place] [address] [phone]  REFERRING PROVIDER:   Venia Carbon, MD [address] [place],  [place] [address] [phone]  RESPONSIBLE PARTY:    Contact Information     Name Relation Home Work Walter [name] Spouse [phone]  [phone]   Walter [name],Walter [name] [phone]     Walter [name] Daughter   [phone]        I met face to face with patient and family in home. Palliative Care was asked to follow this patient by consultation request of  Walter Carbon, MD to address advance care planning and complex medical decision making. This is a follow up visit.  Wife present during visit today                                   ASSESSMENT AND PLAN / RECOMMENDATIONS:   Advance Care Planning/Goals of Care: Goals include to maximize quality of life and symptom management.  CODE STATUS: DNR  Symptom Management/Plan:  Parkinson's disease:  This seems stable at this time.  Continue follow up and recommendations by neurology  Dementia:  This is stable at this time.  Continue supportive care at home  Zenker's diverticulum:  this is causing increased dysphagia and coughing up of phlegm.  Patient trying to get in as much nutrition as possible.  Does supplement with Ensure.  Wife and patient do not wish to pursue any further testing or treatment.  Discussed disease progression and hospice support.  They do want hospice support in the future but wishes to pursue PT at this time   Follow up Palliative Care Visit: Palliative care will continue to follow for complex medical decision making, advance care planning, and clarification  of goals. Return 6 weeks or prn.  Will have RN/SW reach out every other week for check ins.  Encouraged to call with any questions or concerns  I spent 60 minutes providing this consultation. More than 50% of the time in this consultation was spent in counseling and care coordination.   PPS: 40%  HOSPICE ELIGIBILITY/DIAGNOSIS: TBD  Chief Complaint: follow up palliative visit  HISTORY OF PRESENT ILLNESS:  Walter [name] is a 87 y.o. year old male  with Zenker's diverticulum, Parkinson's disease, HTN, BPH, h/o oral cancer, hypothyroidism . Patient is having increased dysphagia and coughing up of phlegm related to Zenker's diverticulum.  Has had imaging per wife that show large Zenker's diverticulum.  Per PCP and neurology, do not feel like this is related to his Parkinson's as symptoms do not lessen when he takes his Sinemet.  In March patient weighed 158 pounds and now weighs 147.6 pounds.  He does endorse that he does not have much of an appetite but does try to eat what he can.  Sometimes the coughing is so bad he is unable to eat much.  States that he is eating less than 50% of what he used to.  Denies fever, N/V, dysuria, hematuria.  Does state alternating from constipation to diarrhea. Does state feeling more tired.  Will be starting PT.  Rest of 10 point ROS  asked and negative except what is stated in HPI.  History obtained from review of EMR and interview with family and Walter [name].    PHYSICAL EXAM:   General: NAD, frail appearing, thin Eyes: sclera anicteric and noninjected with no discharge noted ENMT: moist mucous membranes Cardiovascular: regular rate and rhythm Pulmonary: lung sounds clear; normal respiratory effort Abdomen: soft, nontender, + bowel sounds GU: no suprapubic tenderness Extremities: trace edema of feet and ankles, no joint deformities Skin:  no rashes noted to exposed skin Neurological: Weakness; A&O to person and place   Thank you for the opportunity to participate  in the care of Walter [name].  The palliative care team will continue to follow. Please call our office at [phone] if we can be of additional assistance.    K , NP , DNP  This chart was dictated using voice recognition software.  Despite best efforts to proofread,  errors can occur which can change the documentation meaning.   COVID-19 PATIENT SCREENING TOOL Asked and negative response unless otherwise noted:   Have you had symptoms of covid, tested positive or been in contact with someone with symptoms/positive test in the past 5-10 days?   

## 2020-08-19 ENCOUNTER — Ambulatory Visit: Admit: 2020-08-19 | Payer: PPO | Admitting: Ophthalmology

## 2020-08-19 SURGERY — PHACOEMULSIFICATION, CATARACT, WITH IOL INSERTION
Anesthesia: Topical | Laterality: Right

## 2020-08-21 ENCOUNTER — Telehealth: Payer: Self-pay

## 2020-08-21 NOTE — Telephone Encounter (Signed)
Left message on VM for Walter [name] to try Children's Liquid Imodium 7.5mg  right now and see if that works. Asked her to let us know if that does not help.

## 2020-08-21 NOTE — Telephone Encounter (Signed)
Spoke to [employer].

## 2020-08-21 NOTE — Telephone Encounter (Signed)
I spoke with pts wife (do not see DPR signed for pts wife) pt gave permission to speak with pts wife; pt started with 08/19/20 watery diarrhea;no N&V and no fever. No abd pain. Pt has been eating broth and yogurt and within one hr has uncontrollable watery diarrhea. Pt is drinking plenty of fluids.No mucus or blood in diarrhea. No dizziness and no dry mouth. Pt has not taken any OTC med for diarrhea due to pt having difficulty taking pills. Mrs Panico is requesting a liquid prescription med for diarrhea. [facility]. Sending note to Dr Silvio Pate and Larene Beach CMA.

## 2020-08-21 NOTE — Telephone Encounter (Signed)
May want to check with pharmacist about whether there is a liquid imodium I would not want to use something stronger Can try fiber supplements or I can use cholestyramine which is powder mixed in water

## 2020-08-21 NOTE — Telephone Encounter (Signed)
Patients wife called in stating that her husband has had diarrhea for the last 3 days. She was requesting for him to be seen today. Explained to the patients wife that we do not have any openings. Advised her that I was going to get her the triage nurse (access) and she told me NO that she did not want to talk to them. "That all they would do is send him to the ER and that Dr Silvio Pate told her that he doesn't need to go there all the time as he is there enough" I offered for her to speak to our triage nurse in office to discuss options. EM

## 2020-09-01 ENCOUNTER — Telehealth: Payer: Self-pay

## 2020-09-01 DIAGNOSIS — R262 Difficulty in walking, not elsewhere classified: Secondary | ICD-10-CM | POA: Diagnosis not present

## 2020-09-01 DIAGNOSIS — M6281 Muscle weakness (generalized): Secondary | ICD-10-CM | POA: Diagnosis not present

## 2020-09-01 DIAGNOSIS — G20 Parkinson's disease: Secondary | ICD-10-CM | POA: Diagnosis not present

## 2020-09-01 NOTE — Telephone Encounter (Signed)
1025 am.  Follow up phone call made to wife at the request of Hanley Ben, [facility] NP.  Wife noted PT is currently working with patient.  She is wanting to explore hospice care for the patient.  Wife is noting increase difficulty with patient's care, ongoing weight loss and her need for support.  Po intake is limited to boost and yogurt.  Yesterday patient was able to consume mushroom soup without difficulty.  Weight is down to 143 lbs which is an additional 4 lb weight loss since NP visit on 08/17/20.  BMI 20.5.  Wife is interested in hospice care but would like to speak with the facility SW who has been involved with patient for a few years.  Advised that PC would be able to initiate the referral should she desire this.  Wife states she has already spoken to PCP but will speak with SW before making a final decision.  Wife is advised that Hospice and [facility] could not be involved at the same time.  Advised HH would need to be discontinued if hospice referral is requested.  Wife voiced understanding.  Advised wife that I would notify Hanley Ben, NP of this conversation.  No other concerns voiced by wife at this time.

## 2020-09-04 ENCOUNTER — Other Ambulatory Visit: Payer: Self-pay

## 2020-09-04 ENCOUNTER — Encounter: Payer: Self-pay | Admitting: Emergency Medicine

## 2020-09-04 ENCOUNTER — Emergency Department: Payer: PPO

## 2020-09-04 ENCOUNTER — Emergency Department
Admission: EM | Admit: 2020-09-04 | Discharge: 2020-09-04 | Disposition: A | Discharge status: Home / Self Care | Payer: PPO | Attending: Emergency Medicine | Admitting: Emergency Medicine

## 2020-09-04 DIAGNOSIS — Z85810 Personal history of malignant neoplasm of tongue: Secondary | ICD-10-CM | POA: Diagnosis not present

## 2020-09-04 DIAGNOSIS — E860 Dehydration: Secondary | ICD-10-CM

## 2020-09-04 DIAGNOSIS — Z96652 Presence of left artificial knee joint: Secondary | ICD-10-CM | POA: Diagnosis not present

## 2020-09-04 DIAGNOSIS — Z79899 Other long term (current) drug therapy: Secondary | ICD-10-CM | POA: Diagnosis not present

## 2020-09-04 DIAGNOSIS — N183 Chronic kidney disease, stage 3 unspecified: Secondary | ICD-10-CM | POA: Diagnosis not present

## 2020-09-04 DIAGNOSIS — Z7901 Long term (current) use of anticoagulants: Secondary | ICD-10-CM | POA: Insufficient documentation

## 2020-09-04 DIAGNOSIS — I951 Orthostatic hypotension: Secondary | ICD-10-CM

## 2020-09-04 DIAGNOSIS — G20 Parkinson's disease: Secondary | ICD-10-CM | POA: Diagnosis not present

## 2020-09-04 DIAGNOSIS — E039 Hypothyroidism, unspecified: Secondary | ICD-10-CM | POA: Diagnosis not present

## 2020-09-04 DIAGNOSIS — R059 Cough, unspecified: Secondary | ICD-10-CM | POA: Diagnosis not present

## 2020-09-04 DIAGNOSIS — Z8551 Personal history of malignant neoplasm of bladder: Secondary | ICD-10-CM | POA: Diagnosis not present

## 2020-09-04 DIAGNOSIS — R42 Dizziness and giddiness: Secondary | ICD-10-CM | POA: Diagnosis not present

## 2020-09-04 DIAGNOSIS — I959 Hypotension, unspecified: Secondary | ICD-10-CM | POA: Diagnosis not present

## 2020-09-04 DIAGNOSIS — I129 Hypertensive chronic kidney disease with stage 1 through stage 4 chronic kidney disease, or unspecified chronic kidney disease: Secondary | ICD-10-CM | POA: Diagnosis not present

## 2020-09-04 DIAGNOSIS — R0902 Hypoxemia: Secondary | ICD-10-CM | POA: Diagnosis not present

## 2020-09-04 DIAGNOSIS — Z87891 Personal history of nicotine dependence: Secondary | ICD-10-CM | POA: Diagnosis not present

## 2020-09-04 DIAGNOSIS — I700 Atherosclerosis of aorta: Secondary | ICD-10-CM | POA: Diagnosis not present

## 2020-09-04 LAB — BASIC METABOLIC PANEL
Anion gap: 7 (ref 5–15)
BUN: 26 mg/dL — ABNORMAL HIGH (ref 8–23)
CO2: 28 mmol/L (ref 22–32)
Calcium: 9 mg/dL (ref 8.9–10.3)
Chloride: 104 mmol/L (ref 98–111)
Creatinine, Ser: 1.6 mg/dL — ABNORMAL HIGH (ref 0.61–1.24)
GFR, Estimated: 41 mL/min — ABNORMAL LOW (ref >60)
Glucose, Bld: 113 mg/dL — ABNORMAL HIGH (ref 70–99)
Potassium: 3.9 mmol/L (ref 3.5–5.1)
Sodium: 139 mmol/L (ref 135–145)

## 2020-09-04 LAB — CBC
HCT: 41.2 % (ref 39.0–52.0)
Hemoglobin: 13.6 g/dL (ref 13.0–17.0)
MCH: 30.6 pg (ref 26.0–34.0)
MCHC: 33 g/dL (ref 30.0–36.0)
MCV: 92.8 fL (ref 80.0–100.0)
Platelets: 288 10*3/uL (ref 150–400)
RBC: 4.44 MIL/uL (ref 4.22–5.81)
RDW: 13.5 % (ref 11.5–15.5)
WBC: 7.4 10*3/uL (ref 4.0–10.5)
nRBC: 0 % (ref 0.0–0.2)

## 2020-09-04 MED ORDER — SODIUM CHLORIDE 0.9 % IV BOLUS
1000.0000 mL | Freq: Once | INTRAVENOUS | Status: AC
  Administered 2020-09-04: 1000 mL via INTRAVENOUS

## 2020-09-04 NOTE — Progress Notes (Signed)
   09/04/20 1505  Clinical Encounter Type  Visited With Patient and family together  Visit Type Initial;ED;Spiritual support;Social support  Referral From Other (Comment) (rounding)  Spiritual Encounters  Spiritual Needs  (none specified)  Chaplain Burris engaged PT and son very briefly to inquire on their well-being. PT indicated he was "about to be discharged" and specified no further needs. Chaplain Burris wished him continued recovery.

## 2020-09-04 NOTE — ED Notes (Signed)
Patients family taking patient to SNF when leaving, awaiting callback from social worker before they leave.

## 2020-09-04 NOTE — ED Provider Notes (Signed)
[facility] Emergency Department Provider Note  ____________________________________________  Time seen: Approximately 2:38 PM  I have reviewed the triage vital signs and the nursing notes.   HISTORY  Chief Complaint Dizziness    HPI Walter [name] is a 87 y.o. male with a history of CKD, hypothyroidism, Parkinson's disease, prior PE, Zenker's diverticulum who comes to the ED complaining of lightheadedness with standing for the past 2 days.  Was noted by [facility] nurse to have low blood pressure of about 70 systolic.  Patient reports that he has not been eating and drinking very well over the past few days due to aggravation of his chronic Zenker diverticulum symptoms with foreign body sensation in his throat.  Feels better when he sits down.  No passing out, no chest pain shortness of breath headaches or other worrisome prodromal symptoms.    Past Medical History:  Diagnosis Date   Abdominal aortic aneurysm (AAA)    Actinic keratosis 07/31/2013   ARF (acute renal failure) 2011   after TKR   Benign prostatic hyperplasia    Bladder cancer 03/26/2019   Bradycardia 09/26/2019   C. 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 03/03/2016   Tendonitis 2012   from quinolone   Toe amputation status 2010   Vitamin B12 deficiency 09/09/2014     Patient Active  Problem List   Diagnosis Date Noted   Atherosclerosis of aorta ([place]) 07/30/2020   Facial mass 01/30/2020   Major neurocognitive disorder due to Parkinson's disease without behavioral disturbance 10/30/2019   Hammer toe of right foot 09/26/2019   Fatigue 09/04/2019   Bladder cancer 03/26/2019   Malnutrition of mild degree 12/21/2018   Pressure injury of skin 11/11/2018   Oropharyngeal dysphagia 10/02/2018   Abdominal aortic aneurysm (AAA)    RBD (REM behavioral disorder) 10/24/2017   Renal cyst, right 06/06/2016   CKD (chronic kidney disease) stage 3, GFR 30-59 ml/min ([place]) 03/05/2016   History of pulmonary embolus (PE) 03/03/2016   Vitamin B12 deficiency 09/09/2014   Preventative health care 08/04/2014   Parkinson's disease ([place]) 08/04/2014   Hypertension    Osteoarthritis of both knees    Amputation of toe of right foot ([place])    Benign prostatic hyperplasia    Kidney stones    Hypothyroidism    Tendonitis      Past Surgical History:  Procedure Laterality Date   APPENDECTOMY     CHOLECYSTECTOMY     CYSTOSCOPY W/ RETROGRADES Bilateral 03/18/2019   Procedure: CYSTOSCOPY WITH RETROGRADE PYELOGRAM;  Surgeon: Hollice Espy, MD;  Location: [facility] ORS;  Service: Urology;  Laterality: Bilateral;   CYSTOSCOPY/URETEROSCOPY/HOLMIUM LASER/STENT PLACEMENT Right 09/13/2017   Procedure: CYSTOSCOPY/URETEROSCOPY/HOLMIUM LASER/STENT PLACEMENT;  Surgeon: Hollice Espy, MD;  Location: [facility] ORS;  Service: Urology;  Laterality: Right;   HERNIA REPAIR  INTRAMEDULLARY (IM) NAIL INTERTROCHANTERIC Right 06/13/2017   Procedure: INTRAMEDULLARY (IM) NAIL INTERTROCHANTRIC;  Surgeon: Hessie Knows, MD;  Location: [facility] ORS;  Service: Orthopedics;  Laterality: Right;   LITHOTRIPSY  2009/2010   then stent and cystoscopic removal 2014   Oral cancer removed Right 2004   REPAIR ZENKER'S DIVERTICULA  2013   THYROIDECTOMY  2007   TOE AMPUTATION Right 2010   badly overlapping other toe   TOTAL KNEE  ARTHROPLASTY Left    TRANSURETHRAL RESECTION OF BLADDER TUMOR WITH MITOMYCIN-C N/A 03/18/2019   Procedure: TRANSURETHRAL RESECTION OF BLADDER TUMOR WITH Gemcitabine;  Surgeon: Hollice Espy, MD;  Location: [facility] ORS;  Service: Urology;  Laterality: N/A;   TRANSURETHRAL RESECTION OF PROSTATE  0370   UMBILICAL HERNIA REPAIR  2007     Prior to Admission medications   Medication Sig Start Date End Date Taking? Authorizing Provider  acetaminophen (TYLENOL) 325 MG tablet Take 2 tablets (650 mg total) by mouth every 6 (six) hours as needed for mild pain or moderate pain (or Fever >/= 101). 06/16/17   Dustin Flock, MD  carbidopa-levodopa (PARCOPA) 25-100 MG disintegrating tablet 2 TABLETS AT 8 AM, 1 TABLET AT 11 AM, 2 TABLETS AT 2 PM, 1 TABLET AT 6 PM 03/12/20   Tat, Eustace Quail, DO  ELIQUIS 2.5 MG TABS tablet TAKE 1 TABLET BY MOUTH TWICE A DAY 04/03/20   Venia Carbon, MD  levothyroxine (SYNTHROID) 112 MCG tablet Take 1 tablet (112 mcg total) by mouth daily before breakfast. 07/30/20   Venia Carbon, MD  silver sulfADIAZINE (SILVADENE) 1 % cream Apply 1 application topically daily. 03/26/20   Venia Carbon, MD     Allergies Quinolones   Family History  Problem Relation Age of Onset   Dementia Mother    Stroke Father    Pulmonary disease Brother    Heart disease Neg Hx    Diabetes Neg Hx     Social History Social History   Tobacco Use   Smoking status: Former    Pack years: 0.00    Types: Cigars    Quit date: 04/27/2002    Years since quitting: 18.3   Smokeless tobacco: Never  Vaping Use   Vaping Use: Never used  Substance Use Topics   Alcohol use: Not Currently    Alcohol/week: 0.0 standard drinks   Drug use: No    Review of Systems  Constitutional:   No fever or chills.  ENT:   No sore throat. No rhinorrhea. Cardiovascular:   No chest pain or syncope. Respiratory:   No dyspnea or cough. Gastrointestinal:   Negative for abdominal pain, vomiting and diarrhea.   Musculoskeletal:   Negative for focal pain or swelling All other systems reviewed and are negative except as documented above in ROS and HPI.  ____________________________________________   PHYSICAL EXAM:  VITAL SIGNS: ED Triage Vitals  Enc Vitals Group     BP 09/04/20 1112 108/74     Pulse Rate 09/04/20 1112 71     Resp 09/04/20 1112 16     Temp 09/04/20 1112 98.2 F (36.8 C)     Temp Source 09/04/20 1112 Oral     SpO2 09/04/20 1112 99 %     Weight 09/04/20 1109 147 lb 7.8 oz (66.9 kg)     Height 09/04/20 1109 5\' 10"  (1.778 m)     Head Circumference --      Peak Flow --      Pain Score 09/04/20 1109 0  Pain Loc --      Pain Edu? --      Excl. in [place]? --     Vital signs reviewed, nursing assessments reviewed.   Constitutional:   Alert and oriented. Non-toxic appearance. Eyes:   Conjunctivae are normal. EOMI. PERRL. ENT      Head:   Normocephalic and atraumatic.      Nose:   Normal.      Mouth/Throat: Dry mucous membranes.  No oropharyngeal swelling.      Neck:   No meningismus. Full ROM. Hematological/Lymphatic/Immunilogical:   No cervical lymphadenopathy. Cardiovascular:   RRR. Symmetric bilateral radial and DP pulses.  No murmurs. Cap refill less than 2 seconds. Respiratory:   Normal respiratory effort without tachypnea/retractions. Breath sounds are clear and equal bilaterally. No wheezes/rales/rhonchi. Gastrointestinal:   Soft and nontender. Non distended. There is no CVA tenderness.  No rebound, rigidity, or guarding. Genitourinary:   deferred Musculoskeletal:   Normal range of motion in all extremities. No joint effusions.  No lower extremity tenderness.  No edema. Neurologic:   Normal speech and language.  Motor grossly intact. No acute focal neurologic deficits are appreciated.  Skin:    Skin is warm, dry and intact. No rash noted.  No petechiae, purpura, or bullae.  ____________________________________________    LABS (pertinent  positives/negatives) (all labs ordered are listed, but only abnormal results are displayed) Labs Reviewed  BASIC METABOLIC PANEL - Abnormal; Notable for the following components:      Result Value   Glucose, Bld 113 (*)    BUN 26 (*)    Creatinine, Ser 1.60 (*)    GFR, Estimated 41 (*)    All other components within normal limits  CBC  URINALYSIS, COMPLETE (UACMP) WITH MICROSCOPIC  CBG MONITORING, ED   ____________________________________________   EKG  Interpreted by me Sinus rhythm rate of 65, left axis, right bundle branch block.  Normal ST segments and T waves.  2 PVCs on the strip.  ____________________________________________    RADIOLOGY  DG Chest 2 View  Result Date: 09/04/2020 CLINICAL DATA:  Lightheadedness EXAM: CHEST - 2 VIEW COMPARISON:  12/19/2018 FINDINGS: The heart size and mediastinal contours are within normal limits. Atherosclerotic calcification of the aortic knob. No focal airspace consolidation, pleural effusion, or pneumothorax. The visualized skeletal structures are unremarkable. IMPRESSION: No active cardiopulmonary disease. Electronically Signed   By: Davina Poke D.O.   On: 09/04/2020 13:22    ____________________________________________   PROCEDURES Procedures  ____________________________________________  DIFFERENTIAL DIAGNOSIS   Dehydration, electrolyte normality, anemia, pneumonia  CLINICAL IMPRESSION / ASSESSMENT AND PLAN / ED COURSE  Medications ordered in the ED: Medications  sodium chloride 0.9 % bolus 1,000 mL (0 mLs Intravenous Stopped 09/04/20 1402)    Pertinent labs & imaging results that were available during my care of the patient were reviewed by me and considered in my medical decision making (see chart for details).  Geoge Lawrance was evaluated in Emergency Department on 09/04/2020 for the symptoms described in the history of present illness. He was evaluated in the context of the global COVID-19 pandemic, which necessitated  consideration that the patient might be at risk for infection with the SARS-CoV-2 virus that causes COVID-19. Institutional protocols and algorithms that pertain to the evaluation of patients at risk for COVID-19 are in a state of rapid change based on information released by regulatory bodies including the CDC and federal and state organizations. These policies and algorithms were followed during the patient's care in the ED.  Patient presents with lightheadedness with standing.  Initial vital signs normal, but orthostatic vitals show hypotension with standing with blood pressure of 80/49.  Patient given IV fluid bolus, orthostatics normalized and patient feeling better.  Labs unremarkable, chest x-ray unremarkable, stable for discharge home.  No signs or symptoms of worrisome underlying cardiac, pulmonary, vascular, neurologic event.  Doubt ACS PE dissection AAA stroke or infection.      ____________________________________________   FINAL CLINICAL IMPRESSION(S) / ED DIAGNOSES    Final diagnoses:  Dehydration  Orthostatic hypotension     ED Discharge Orders     None       Portions of this note were generated with dragon dictation software. Dictation errors may occur despite best attempts at proofreading.    Carrie Mew, MD 09/04/20 [phone]

## 2020-09-04 NOTE — ED Notes (Signed)
Patient returned from radiology

## 2020-09-04 NOTE — Progress Notes (Addendum)
Received call from Roy @ [facility] [phone] states patient is resident at [facility] with his wife who is currently recovering from hospitalization. Patient will need Fl2 if needs any assistance at discharge.   Wife has been working with SW at facility for possible Hospice services due to decline in condition.

## 2020-09-04 NOTE — Discharge Instructions (Addendum)
Continue to take all your medications and be sure to drink lots of fluids to stay hydrated.  Please follow-up with your ENT doctor regarding your swallowing symptoms if you are still having trouble.

## 2020-09-04 NOTE — NC FL2 (Signed)
[place] LEVEL OF CARE SCREENING TOOL     IDENTIFICATION  Patient Name: Walter [name] Birthdate: Jun 29, 1931 Sex: male Admission Date (Current Location): 09/04/2020  [place] and [place] Number:  Engineering geologist and Address:  [facility], [address] [place], [place] [address]      Provider Number: [id number]  Attending Physician Name and Address:  Carrie Mew, MD  Relative Name and Phone Number:       Current Level of Care: Hospital Recommended Level of Care: [place] Prior Approval Number:    Date Approved/Denied:   PASRR Number:    Discharge Plan: SNF    Current Diagnoses: Patient Active Problem List   Diagnosis Date Noted   Atherosclerosis of aorta ([place]) 07/30/2020   Facial mass 01/30/2020   Major neurocognitive disorder due to Parkinson's disease without behavioral disturbance 10/30/2019   Hammer toe of right foot 09/26/2019   Fatigue 09/04/2019   Bladder cancer 03/26/2019   Malnutrition of mild degree 12/21/2018   Pressure injury of skin 11/11/2018   Oropharyngeal dysphagia 10/02/2018   Abdominal aortic aneurysm (AAA)    RBD (REM behavioral disorder) 10/24/2017   Renal cyst, right 06/06/2016   CKD (chronic kidney disease) stage 3, GFR 30-59 ml/min ([place]) 03/05/2016   History of pulmonary embolus (PE) 03/03/2016   Vitamin B12 deficiency 09/09/2014   Preventative health care 08/04/2014   Parkinson's disease ([place]) 08/04/2014   Hypertension    Osteoarthritis of both knees    Amputation of toe of right foot ([place])    Benign prostatic hyperplasia    Kidney stones    Hypothyroidism    Tendonitis     Orientation RESPIRATION BLADDER Height & Weight     Self, Time, Situation, Place  Normal Continent Weight: 66.9 kg Height:  5\' 10"  (177.8 cm)  BEHAVIORAL SYMPTOMS/MOOD NEUROLOGICAL BOWEL NUTRITION STATUS      Continent Diet  AMBULATORY STATUS COMMUNICATION OF NEEDS Skin   Limited Assist Verbally  Normal                       Personal Care Assistance Level of Assistance  Bathing, Dressing, Feeding Bathing Assistance: Limited assistance Feeding assistance: Limited assistance Dressing Assistance: Limited assistance     Functional Limitations Info             SPECIAL CARE FACTORS FREQUENCY  PT (By licensed PT), OT (By licensed OT)     PT Frequency: min5 xweek OT Frequency: min 5xweek            Contractures      Additional Factors Info                  Current Medications (09/04/2020):  This is the current hospital active medication list No current facility-administered medications for this encounter.   Current Outpatient Medications  Medication Sig Dispense Refill   acetaminophen (TYLENOL) 325 MG tablet Take 2 tablets (650 mg total) by mouth every 6 (six) hours as needed for mild pain or moderate pain (or Fever >/= 101).     carbidopa-levodopa (PARCOPA) 25-100 MG disintegrating tablet 2 TABLETS AT 8 AM, 1 TABLET AT 11 AM, 2 TABLETS AT 2 PM, 1 TABLET AT 6 PM 540 tablet 1   ELIQUIS 2.5 MG TABS tablet TAKE 1 TABLET BY MOUTH TWICE A DAY 180 tablet 3   levothyroxine (SYNTHROID) 112 MCG tablet Take 1 tablet (112 mcg total) by mouth daily before breakfast. 90 tablet 3  silver sulfADIAZINE (SILVADENE) 1 % cream Apply 1 application topically daily. 50 g 1     Discharge Medications: Please see discharge summary for a list of discharge medications.  Relevant Imaging Results:  Relevant Lab Results:   Additional Information [id number]  Anselm Pancoast, RN

## 2020-09-04 NOTE — ED Triage Notes (Signed)
C?O dizziness x 2 days.  States dizziness occurs when standing up to walk.  Reports attempting to going to see Nurse on Duty at [facility] today, took blood pressure and BP was low -- 59'Y systolic per EMS report.  EMS reported VS wnl, SBP 110/120.Marland Kitchen  Patient AAOx3.  Skin warm and dry . NAD.  MAE equally and strong.  NAD

## 2020-09-04 NOTE — Progress Notes (Signed)
[facility] ED [facility] Hospital Liaison note: ? ?This patient is currently enrolled in [facility] outpatient-based Palliative Care. Will continue to follow for disposition. ? ?Please call with any outpatient palliative questions or concerns. ? ?Thank you, ?Dee Curry, LPN ?[facility] Hospital Liaison ?[phone] ?

## 2020-09-07 DIAGNOSIS — G20 Parkinson's disease: Secondary | ICD-10-CM | POA: Diagnosis not present

## 2020-09-07 DIAGNOSIS — F0280 Dementia in other diseases classified elsewhere without behavioral disturbance: Secondary | ICD-10-CM | POA: Diagnosis not present

## 2020-09-07 DIAGNOSIS — Z86711 Personal history of pulmonary embolism: Secondary | ICD-10-CM | POA: Diagnosis not present

## 2020-09-07 DIAGNOSIS — E440 Moderate protein-calorie malnutrition: Secondary | ICD-10-CM | POA: Diagnosis not present

## 2020-09-07 DIAGNOSIS — N1832 Chronic kidney disease, stage 3b: Secondary | ICD-10-CM | POA: Diagnosis not present

## 2020-09-07 DIAGNOSIS — R1312 Dysphagia, oropharyngeal phase: Secondary | ICD-10-CM | POA: Diagnosis not present

## 2020-09-15 ENCOUNTER — Other Ambulatory Visit: Payer: Self-pay | Admitting: Urology

## 2020-09-16 NOTE — Progress Notes (Signed)
Assessment/Plan:   1.  Parkinsons Disease  -Continue Parcopa 25/100, 2 tabs at 8 AM/1 tablet at 11 AM/2 tablets at 2 PM/1 tablet 6 PM  -Patient followed by palliative care and plans to enroll in hospice.    -Patient asked about doing rock steady boxing.  Encouraged them to seek at that program online since he will be entering into hospice.  2.  Dysphagia  -Is the reason he needs Parcopa.  However, the biggest issue is a Zenker's diverticulum.  He is followed by gastroenterology but they feel that there is nothing more that they can do.  The same is true with ENT.  -Diet is modified to pure.  -discussed MBE and wife stated that they didn't want that.  Last was done in 2020, and was really fairly unremarkable, with the exception of the issues associated with the Zenker's.  They want to proceed with hospice  -We discussed a wedge pillow or sitting upright until they get the hospital bed.  -Discussed that hospice will likely start him on something like a scopolamine patch to help with his secretions.  They are going to talk with hospice about that.  3.  PDD  -Last neurocognitive testing with Dr. Melvyn Novas in August, 2021 with evidence of PDD  -end of life care discussed.  Wife states that patient is sleeping a lot.  Discussed that that is really an actual stay and discussed that it is okay for the patient to sleep a lot.  4.  It is difficult for the patient/wife to get him into the office and we will plan on a online visit, as the patient transitions to hospice.   Subjective:   Walter [name] was seen today in follow up for Parkinsons disease.  My previous records were reviewed prior to todays visit as well as outside records available to me. Pt denies lightheadedness, near syncope.  No hallucinations but having memory trouble. No falls and gets around with the walker. Patient in the emergency room May 24 because of dysphagia (long-term history of this).  Emergency room work-up was negative.   Followed up with primary care 2 days later (did have a conversation with primary care regarding that).  He is having trouble even with pured meals.  Wife noting lots of mucous secretions.  Saw ENT and told couldn't do anything because of advanced age.  They have engaged with hospice and plan to start those service.  Losing lots of weight b/c of eating issues.  Current prescribed movement disorder medications: Parcopa 25/100, 2 tablets at 8 AM/1 tablet at 11 AM/2 tablets at 2 PM/1 tablet at 6 PM     PREVIOUS MEDICATIONS: Carbidopa/levodopa 25/250    ALLERGIES:   Allergies  Allergen Reactions   Quinolones Other (See Comments)    tendonitis    CURRENT MEDICATIONS:  Outpatient Encounter Medications as of 09/17/2020  Medication Sig   acetaminophen (TYLENOL) 325 MG tablet Take 2 tablets (650 mg total) by mouth every 6 (six) hours as needed for mild pain or moderate pain (or Fever >/= 101).   aspirin 81 MG EC tablet Take 81 mg by mouth daily. Swallow whole.   levothyroxine (SYNTHROID) 112 MCG tablet Take 1 tablet (112 mcg total) by mouth daily before breakfast.   silver sulfADIAZINE (SILVADENE) 1 % cream Apply 1 application topically daily.   [DISCONTINUED] carbidopa-levodopa (PARCOPA) 25-100 MG disintegrating tablet 2 TABLETS AT 8 AM, 1 TABLET AT 11 AM, 2 TABLETS AT 2 PM, 1 TABLET AT 6  PM   carbidopa-levodopa (PARCOPA) 25-100 MG disintegrating tablet 2 TABLETS AT 8 AM, 1 TABLET AT 11 AM, 2 TABLETS AT 2 PM, 1 TABLET AT 6 PM   [DISCONTINUED] ELIQUIS 2.5 MG TABS tablet TAKE 1 TABLET BY MOUTH TWICE A DAY (Patient not taking: Reported on 09/17/2020)   No facility-administered encounter medications on file as of 09/17/2020.    Objective:   PHYSICAL EXAMINATION:    VITALS:   Vitals:   09/17/20 1433  BP: (!) 104/54  Pulse: 71  SpO2: 96%  Weight: 145 lb (65.8 kg)  Height: 5\' 10"  (1.778 m)    GEN:  The patient appears stated age and is in NAD. HEENT:  Normocephalic, atraumatic.  The  mucous membranes are moist. The superficial temporal arteries are without ropiness or tenderness.  Can hear upper airway secretions CV:  RRR Lungs:  CTAB Neck/HEME:  There are no carotid bruits bilaterally.  Neurological examination:  Orientation: The patient is alert and oriented to person and place.  Looks to wife for finer aspects of the history. Cranial nerves: There is good facial symmetry with facial hypomimia. The speech is fluent and clear. Soft palate rises symmetrically and there is no tongue deviation. Hearing is intact to conversational tone. Sensation: Sensation is intact to light touch throughout Motor: Strength is at least antigravity x4.  Movement examination: Tone: There is normal tone in the upper and lower extremities. Abnormal movements: None Coordination:  There is slowness of rapid alternating movements, but no true decremation.   Gait and Station: The patient pushes off of the transport chair to arise.  He is given a walker.  Once out in the hall, he actually walks fairly well with a walker.    I have reviewed and interpreted the following labs independently    Chemistry      Component Value Date/Time   NA 139 09/04/2020 1120   K 3.9 09/04/2020 1120   CL 104 09/04/2020 1120   CO2 28 09/04/2020 1120   BUN 26 (H) 09/04/2020 1120   CREATININE 1.60 (H) 09/04/2020 1120      Component Value Date/Time   CALCIUM 9.0 09/04/2020 1120   ALKPHOS 89 07/30/2020 1524   AST 13 07/30/2020 1524   ALT 6 07/30/2020 1524   BILITOT 1.0 07/30/2020 1524       Lab Results  Component Value Date   WBC 7.4 09/04/2020   HGB 13.6 09/04/2020   HCT 41.2 09/04/2020   MCV 92.8 09/04/2020   PLT 288 09/04/2020    Lab Results  Component Value Date   TSH 0.569 11/11/2018     Total time spent on today's visit was 30 minutes, including both face-to-face time and nonface-to-face time.  Time included that spent on review of records (prior notes available to me/labs/imaging if  pertinent), discussing treatment and goals, answering patient's questions and coordinating care.  Cc:  Venia Carbon, MD

## 2020-09-17 ENCOUNTER — Ambulatory Visit: Payer: PPO | Admitting: Neurology

## 2020-09-17 ENCOUNTER — Other Ambulatory Visit: Payer: Self-pay

## 2020-09-17 ENCOUNTER — Encounter: Payer: Self-pay | Admitting: Neurology

## 2020-09-17 VITALS — BP 104/54 | HR 71 | Ht 70.0 in | Wt 145.0 lb

## 2020-09-17 DIAGNOSIS — Z515 Encounter for palliative care: Secondary | ICD-10-CM | POA: Diagnosis not present

## 2020-09-17 DIAGNOSIS — R1312 Dysphagia, oropharyngeal phase: Secondary | ICD-10-CM | POA: Diagnosis not present

## 2020-09-17 DIAGNOSIS — G20 Parkinson's disease: Secondary | ICD-10-CM

## 2020-09-17 MED ORDER — CARBIDOPA-LEVODOPA 25-100 MG PO TBDP: ORAL_TABLET | ORAL | 3 refills | Status: AC

## 2020-10-06 ENCOUNTER — Other Ambulatory Visit: Payer: PPO | Admitting: Student

## 2020-10-15 ENCOUNTER — Ambulatory Visit (INDEPENDENT_AMBULATORY_CARE_PROVIDER_SITE_OTHER): Payer: PPO | Admitting: Internal Medicine

## 2020-10-15 ENCOUNTER — Other Ambulatory Visit: Payer: Self-pay

## 2020-10-15 ENCOUNTER — Encounter: Payer: Self-pay | Admitting: Internal Medicine

## 2020-10-15 DIAGNOSIS — G20 Parkinson's disease: Secondary | ICD-10-CM | POA: Diagnosis not present

## 2020-10-15 DIAGNOSIS — R1312 Dysphagia, oropharyngeal phase: Secondary | ICD-10-CM

## 2020-10-15 DIAGNOSIS — E441 Mild protein-calorie malnutrition: Secondary | ICD-10-CM | POA: Diagnosis not present

## 2020-10-15 DIAGNOSIS — L89312 Pressure ulcer of right buttock, stage 2: Secondary | ICD-10-CM | POA: Diagnosis not present

## 2020-10-15 DIAGNOSIS — L89302 Pressure ulcer of unspecified buttock, stage 2: Secondary | ICD-10-CM | POA: Insufficient documentation

## 2020-10-15 MED ORDER — SILVER SULFADIAZINE 1 % EX CREA: 1.0000 "application " | TOPICAL_CREAM | Freq: Every day | CUTANEOUS | 1 refills | Status: AC

## 2020-10-15 NOTE — Assessment & Plan Note (Signed)
Has gained back 3# Is on hospice now

## 2020-10-15 NOTE — Assessment & Plan Note (Signed)
Mostly from the Zenker's Has improved somewhat tolerating the pureed food

## 2020-10-15 NOTE — Assessment & Plan Note (Signed)
Stable functional status Needs daily care but doing okay at home with daily aide and the parcopa

## 2020-10-15 NOTE — Assessment & Plan Note (Signed)
Persistent ~1cm ulcer Will granulate and then open again They use the silvadene when it opens

## 2020-10-15 NOTE — Progress Notes (Signed)
Subjective:    Patient ID: Walter [name], male    DOB: January 09, 1932, 87 y.o.   MRN: [id number]  HPI Here for follow up of Parkinson's and dysphagia With wife This visit occurred during the SARS-CoV-2 public health emergency.  Safety protocols were in place, including screening questions prior to the visit, additional usage of staff PPE, and extensive cleaning of exam room while observing appropriate contact time as indicated for disinfecting solutions.   Home for about a month He feels things are "better" in general Swallowing is better PUreed "Mom's meals" Still has to be very careful with fluids---will aspirate at times Wife will thicken in the morning when he is very thirsty  Hospice did open his case Has hospital bed Aide daily for an hour in the morning---help with bathing and dressing Wife does the shopping and instrumental ADLs  No chest pain No SOB  Current Outpatient Medications on File Prior to Visit  Medication Sig Dispense Refill   acetaminophen (TYLENOL) 325 MG tablet Take 2 tablets (650 mg total) by mouth every 6 (six) hours as needed for mild pain or moderate pain (or Fever >/= 101).     aspirin 81 MG EC tablet Take 81 mg by mouth daily. Swallow whole.     carbidopa-levodopa (PARCOPA) 25-100 MG disintegrating tablet 2 TABLETS AT 8 AM, 1 TABLET AT 11 AM, 2 TABLETS AT 2 PM, 1 TABLET AT 6 PM 540 tablet 3   levothyroxine (SYNTHROID) 112 MCG tablet Take 1 tablet (112 mcg total) by mouth daily before breakfast. 90 tablet 3   silver sulfADIAZINE (SILVADENE) 1 % cream Apply 1 application topically daily. 50 g 1   No current facility-administered medications on file prior to visit.    Allergies  Allergen Reactions   Quinolones Other (See Comments)    tendonitis    Past Medical History:  Diagnosis Date   Abdominal aortic aneurysm (AAA)    Actinic keratosis 07/31/2013   ARF (acute renal failure) 2011   after TKR   Benign prostatic hyperplasia    Bladder cancer 03/26/2019    Bradycardia 09/26/2019   C. 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 03/03/2016   Tendonitis 2012   from quinolone   Toe amputation status 2010   Vitamin B12 deficiency 09/09/2014    Past Surgical History:  Procedure Laterality Date   APPENDECTOMY     CHOLECYSTECTOMY     CYSTOSCOPY W/ RETROGRADES Bilateral 03/18/2019   Procedure: CYSTOSCOPY WITH RETROGRADE PYELOGRAM;  Surgeon: Hollice Espy, MD;  Location: [facility];  Service: Urology;  Laterality: Bilateral;   CYSTOSCOPY/URETEROSCOPY/HOLMIUM LASER/STENT PLACEMENT Right 09/13/2017   Procedure: CYSTOSCOPY/URETEROSCOPY/HOLMIUM LASER/STENT PLACEMENT;  Surgeon: Hollice Espy, MD;  Location: [facility];  Service: Urology;  Laterality: Right;   HERNIA REPAIR     INTRAMEDULLARY (IM) NAIL INTERTROCHANTERIC Right 06/13/2017  Procedure: INTRAMEDULLARY (IM) NAIL INTERTROCHANTRIC;  Surgeon: Hessie Knows, MD;  Location: [facility];  Service: Orthopedics;  Laterality: Right;   LITHOTRIPSY  2009/2010   then stent and cystoscopic removal 2014   Oral cancer removed Right 2004   REPAIR ZENKER'S DIVERTICULA  2013   THYROIDECTOMY  2007   TOE AMPUTATION Right 2010   badly overlapping other toe   TOTAL KNEE ARTHROPLASTY Left    TRANSURETHRAL RESECTION OF BLADDER TUMOR WITH MITOMYCIN-C N/A 03/18/2019   Procedure: TRANSURETHRAL RESECTION OF BLADDER TUMOR  WITH Gemcitabine;  Surgeon: Hollice Espy, MD;  Location: [facility];  Service: Urology;  Laterality: N/A;   TRANSURETHRAL RESECTION OF PROSTATE  [id number]   UMBILICAL HERNIA REPAIR  2007    Family History  Problem Relation Age of Onset   Dementia Mother    Stroke Father    Pulmonary disease Brother    Heart disease Neg Hx    Diabetes Neg Hx     Social History   Socioeconomic History   Marital status: Married    Spouse name: Not on file   Number of children: 3   Years of education: 16   Highest education level: Bachelor's degree (e.g., BA, AB, BS)  Occupational History   Occupation: Merchandiser, 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  Tobacco Use   Smoking status: Former    Types: Cigars    Quit date: 04/27/2002    Years since quitting: 18.4   Smokeless tobacco: Never  Vaping Use   Vaping Use: Never used  Substance and Sexual Activity   Alcohol use: Not Currently    Alcohol/week: 0.0 standard drinks   Drug use: No   Sexual activity: Not Currently  Other Topics Concern   Not on file  Social History Narrative   Son works for [employer]   1 daughter in [place], other in [place]      Has living will   Wife, then son, would be health care POA   Has DNR   No tube feeds if cognitively unaware   Has MOST form done 12/21/18   Social Determinants of Health   Financial Resource Strain: Not on file  Food Insecurity: Not on file  Transportation Needs: Not on file  Physical Activity: Not on file  Stress: Not on file  Social Connections: Not on file  Intimate Partner Violence: Not on file   Review of Systems Has regained some of his lost weight Sleeping okay--and a lot in the day    Objective:   Physical Exam Cardiovascular:     Rate and Rhythm: Normal rate and regular rhythm.     Heart sounds: No murmur heard.   No gallop.  Pulmonary:     Effort: Pulmonary effort is normal.     Breath sounds: Normal breath sounds. No wheezing or  rales.  Musculoskeletal:     Right lower leg: No edema.     Left lower leg: No edema.     Comments: Feet purplish but still with palpable pulse  Neurological:     Comments: Psychomotor retardation but no sig tremor + bradykinesia           Assessment & Plan:

## 2020-10-30 ENCOUNTER — Telehealth: Payer: Self-pay

## 2020-10-30 NOTE — Telephone Encounter (Signed)
Walter [name], [place], left message on triage VM, requesting that Dr. Silvio Pate send in Rx to help pt with increased secretions. Pt's wife has reported that pt is choking from the significant secretions.

## 2020-10-30 NOTE — Telephone Encounter (Signed)
Patient called and checked on. Wife states that he is doing better and that she only wants to go with OTC medications they are not doing any new Rx's.

## 2020-11-01 NOTE — Telephone Encounter (Signed)
Yes---that was our decision. No further action about this for now.

## 2020-11-21 DIAGNOSIS — R0902 Hypoxemia: Secondary | ICD-10-CM | POA: Diagnosis not present

## 2020-11-27 ENCOUNTER — Telehealth: Payer: Self-pay | Admitting: Internal Medicine

## 2020-11-27 NOTE — Telephone Encounter (Signed)
Pt  wife called stating that pt is at [facility] and that he is dying.

## 2020-11-27 NOTE — Telephone Encounter (Signed)
He worsened and was [facility] 6 days ago. Now actively dying Offered my condolences

## 2020-12-29 DEATH — deceased

## 2021-01-14 ENCOUNTER — Ambulatory Visit: Payer: PPO | Admitting: Internal Medicine
